# Patient Record
Sex: Female | Born: 1955 | Race: Black or African American | Hispanic: No | Marital: Single | Smoking: Never smoker
Health system: Southern US, Community
[De-identification: ages and names within clinical notes are randomized; demographics above are authoritative.]

## PROBLEM LIST (undated history)

## (undated) DIAGNOSIS — M549 Dorsalgia, unspecified: Secondary | ICD-10-CM

## (undated) DIAGNOSIS — M2550 Pain in unspecified joint: Secondary | ICD-10-CM

## (undated) DIAGNOSIS — T7840XA Allergy, unspecified, initial encounter: Secondary | ICD-10-CM

## (undated) DIAGNOSIS — M1990 Unspecified osteoarthritis, unspecified site: Secondary | ICD-10-CM

## (undated) DIAGNOSIS — I10 Essential (primary) hypertension: Secondary | ICD-10-CM

## (undated) DIAGNOSIS — M069 Rheumatoid arthritis, unspecified: Secondary | ICD-10-CM

## (undated) DIAGNOSIS — E119 Type 2 diabetes mellitus without complications: Secondary | ICD-10-CM

## (undated) DIAGNOSIS — H269 Unspecified cataract: Secondary | ICD-10-CM

## (undated) DIAGNOSIS — R7303 Prediabetes: Secondary | ICD-10-CM

## (undated) DIAGNOSIS — K219 Gastro-esophageal reflux disease without esophagitis: Secondary | ICD-10-CM

## (undated) DIAGNOSIS — M5430 Sciatica, unspecified side: Secondary | ICD-10-CM

## (undated) DIAGNOSIS — D649 Anemia, unspecified: Secondary | ICD-10-CM

## (undated) DIAGNOSIS — R600 Localized edema: Secondary | ICD-10-CM

## (undated) DIAGNOSIS — M810 Age-related osteoporosis without current pathological fracture: Secondary | ICD-10-CM

## (undated) DIAGNOSIS — I169 Hypertensive crisis, unspecified: Secondary | ICD-10-CM

## (undated) DIAGNOSIS — G4730 Sleep apnea, unspecified: Secondary | ICD-10-CM

## (undated) DIAGNOSIS — Z91018 Allergy to other foods: Secondary | ICD-10-CM

## (undated) DIAGNOSIS — D571 Sickle-cell disease without crisis: Secondary | ICD-10-CM

## (undated) HISTORY — PX: TUBAL LIGATION: SHX77

## (undated) HISTORY — DX: Dorsalgia, unspecified: M54.9

## (undated) HISTORY — DX: Sickle-cell disease without crisis: D57.1

## (undated) HISTORY — DX: Sleep apnea, unspecified: G47.30

## (undated) HISTORY — DX: Morbid (severe) obesity due to excess calories: E66.01

## (undated) HISTORY — DX: Allergy, unspecified, initial encounter: T78.40XA

## (undated) HISTORY — DX: Pain in unspecified joint: M25.50

## (undated) HISTORY — DX: Anemia, unspecified: D64.9

## (undated) HISTORY — DX: Allergy to other foods: Z91.018

## (undated) HISTORY — DX: Essential (primary) hypertension: I10

## (undated) HISTORY — DX: Rheumatoid arthritis, unspecified: M06.9

## (undated) HISTORY — PX: OTHER SURGICAL HISTORY: SHX169

## (undated) HISTORY — PX: ABDOMINAL HYSTERECTOMY: SHX81

## (undated) HISTORY — DX: Gastro-esophageal reflux disease without esophagitis: K21.9

## (undated) HISTORY — PX: CHOLECYSTECTOMY: SHX55

## (undated) HISTORY — DX: Prediabetes: R73.03

## (undated) HISTORY — DX: Hypertensive crisis, unspecified: I16.9

## (undated) HISTORY — DX: Type 2 diabetes mellitus without complications: E11.9

## (undated) HISTORY — DX: Localized edema: R60.0

## (undated) HISTORY — PX: ORIF WRIST FRACTURE: SHX2133

## (undated) HISTORY — DX: Sciatica, unspecified side: M54.30

## (undated) HISTORY — DX: Unspecified osteoarthritis, unspecified site: M19.90

## (undated) HISTORY — DX: Age-related osteoporosis without current pathological fracture: M81.0

## (undated) HISTORY — PX: CARPAL TUNNEL RELEASE: SHX101

## (undated) HISTORY — DX: Unspecified cataract: H26.9

## (undated) HISTORY — PX: ORIF ELBOW FRACTURE: SUR928

## (undated) HISTORY — PX: JOINT REPLACEMENT: SHX530

---

## 2018-09-13 ENCOUNTER — Other Ambulatory Visit: Payer: Self-pay | Admitting: Neurological Surgery

## 2018-09-13 DIAGNOSIS — S0990XS Unspecified injury of head, sequela: Secondary | ICD-10-CM

## 2018-09-13 DIAGNOSIS — G44309 Post-traumatic headache, unspecified, not intractable: Secondary | ICD-10-CM

## 2018-09-18 ENCOUNTER — Ambulatory Visit
Admission: RE | Admit: 2018-09-18 | Discharge: 2018-09-18 | Disposition: A | Discharge status: Home / Self Care | Payer: Medicare Other | Source: Ambulatory Visit | Attending: Neurological Surgery | Admitting: Neurological Surgery

## 2018-09-18 ENCOUNTER — Other Ambulatory Visit: Payer: Self-pay

## 2018-09-18 DIAGNOSIS — G44309 Post-traumatic headache, unspecified, not intractable: Secondary | ICD-10-CM

## 2018-09-25 ENCOUNTER — Telehealth: Payer: Self-pay | Admitting: Neurology

## 2018-09-25 NOTE — Telephone Encounter (Signed)
Due to current COVID 19 pandemic, our office is severely reducing in office visits until further notice, in order to minimize the risk to our patients and healthcare providers.   I called patient to confirm her 7/2 appointment with Dr. Leonie Man. LVM advising patient that she is welcome to come to the office for this appointment, and if so she will need to wear a mask. I also advised if patient does not wish to come in the office at this time she can call our office to schedule a virtual visit or reschedule for a later date. My direct office contact info was provided.

## 2018-09-26 ENCOUNTER — Other Ambulatory Visit: Payer: Self-pay

## 2018-09-26 ENCOUNTER — Encounter: Payer: Self-pay | Admitting: Neurology

## 2018-09-26 ENCOUNTER — Ambulatory Visit (INDEPENDENT_AMBULATORY_CARE_PROVIDER_SITE_OTHER): Payer: Medicare Other | Admitting: Neurology

## 2018-09-26 ENCOUNTER — Telehealth: Payer: Self-pay | Admitting: Neurology

## 2018-09-26 VITALS — BP 148/92 | HR 94 | Temp 98.4°F | Ht 65.0 in | Wt 320.0 lb

## 2018-09-26 DIAGNOSIS — S060X9S Concussion with loss of consciousness of unspecified duration, sequela: Secondary | ICD-10-CM | POA: Diagnosis not present

## 2018-09-26 DIAGNOSIS — R251 Tremor, unspecified: Secondary | ICD-10-CM

## 2018-09-26 DIAGNOSIS — R413 Other amnesia: Secondary | ICD-10-CM | POA: Diagnosis not present

## 2018-09-26 DIAGNOSIS — G3184 Mild cognitive impairment, so stated: Secondary | ICD-10-CM | POA: Diagnosis not present

## 2018-09-26 MED ORDER — TOPIRAMATE 25 MG PO TABS: 25.0000 mg | ORAL_TABLET | Freq: Every day | ORAL | 2 refills | Status: DC

## 2018-09-26 MED ORDER — TOPIRAMATE 25 MG PO TABS: 25.0000 mg | ORAL_TABLET | Freq: Every day | ORAL | 3 refills | Status: DC

## 2018-09-26 NOTE — Patient Instructions (Signed)
I had a long discussion with the patient and her daughter regarding her multifocal symptoms following a closed head injury in July 2018 likely representing postconcussive syndrome.  I recommend further evaluation by checking MRI scan of the brain with and without contrast, EEG and memory panel labs.  Trial of Topamax 25 mg twice daily to help with her headaches as well as tremors.  I have advised her to minimize taking Flexeril, gabapentin and tramadol due to questionable effect Kasie and possible side effects.  We also encouraged her to do memory compensation strategies and participate in cognitively challenging activities like solving crossword puzzles, playing bridge and sudoku.  She can also take fish oil 1000mg  daily.  She will return for follow-up in 2 months or call earlier if necessary Memory Compensation Strategies  1. Use "WARM" strategy.  W= write it down  A= associate it  R= repeat it  M= make a mental note  2.   You can keep a Social worker.  Use a 3-ring notebook with sections for the following: calendar, important names and phone numbers,  medications, doctors' names/phone numbers, lists/reminders, and a section to journal what you did  each day.   3.    Use a calendar to write appointments down.  4.    Write yourself a schedule for the day.  This can be placed on the calendar or in a separate section of the Memory Notebook.  Keeping a  regular schedule can help memory.  5.    Use medication organizer with sections for each day or morning/evening pills.  You may need help loading it  6.    Keep a basket, or pegboard by the door.  Place items that you need to take out with you in the basket or on the pegboard.  You may also want to  include a message board for reminders.  7.    Use sticky notes.  Place sticky notes with reminders in a place where the task is performed.  For example: " turn off the  stove" placed by the stove, "lock the door" placed on the door at eye level, "  take your medications" on  the bathroom mirror or by the place where you normally take your medications.  8.    Use alarms/timers.  Use while cooking to remind yourself to check on food or as a reminder to take your medicine, or as a  reminder to make a call, or as a reminder to perform another task, etc.

## 2018-09-26 NOTE — Progress Notes (Signed)
[facility] [address] [place]. [place] [address] [phone]       OFFICE CONSULT NOTE  [name] Date of Birth:  [birth year] Medical Record Number:  [id number]   Referring MD: Sherley Bounds Reason for Referral: Headaches HPI: Brandi [name] is a 63 year old pleasant Caucasian lady who is seen today for initial consultation visit.  She is accompanied by her daughter.  History is obtained from her and review of referral notes.  I have reviewed imaging films in PACS.  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  She states she fell without warm warning face forward and sustained bruises on her forehead and face.  She states she did not lose consciousness but daughter of states that she may have briefly lost it.  Patient was seen in the ER in the hospital where apparently brain scan was unremarkable however I do not have those hospital records for my review today.  Since then she has had constant headaches, tremors of her hands, short-term memory difficulties.  She states her balance is poor.  She has been using a walker at times and she can use a cane also and she wants to.  She is also been dropping objects from her hand intermittently.  She also complains of some occasional word finding difficulties and states her speech is slow.  This symptoms have continued and have not gotten better.  She had a CT scan of the head ordered by Dr. Ronnald Ramp on 09/18/2018 which I personally reviewed shows no acute abnormality.  Patient states she was seeing a neurologist in [place] but I do not have those records either.  She does not remember having an MRI scan done.  She has also chronic back pain and sciatica and she takes Flexeril 10 mg 3 times daily, gabapentin 300 mg 3 times daily and tramadol as needed.  She also takes Requip 1 mg at night for restless legs.  Patient denies any significant increasing headaches, focal extremity weakness,  stroke, TIA, seizures.  ROS:   14 system review of systems is positive for headaches, memory loss, tremors, gait and balance difficulties, word finding difficulty, sciatica and all other systems negative PMH:  Past Medical History:  Diagnosis Date   Hypertension     Social History:  Social History   Socioeconomic History   Marital status: Single    Spouse name: Not on file   Number of children: Not on file   Years of education: Not on file   Highest education level: Not on file  Occupational History   Not on file  Social Needs   Financial resource strain: Not on file   Food insecurity    Worry: Not on file    Inability: Not on file   Transportation needs    Medical: Not on file    Non-medical: Not on file  Tobacco Use   Smoking status: Never Smoker   Smokeless tobacco: Never Used  Substance and Sexual Activity   Alcohol use: Not Currently   Drug use: Not Currently   Sexual activity: Not on file  Lifestyle   Physical activity    Days per week: Not on file    Minutes per session: Not on file   Stress: Not on file  Relationships   Social connections    Talks on phone: Not on file    Gets together: Not on file    Attends religious service: Not  on file    Active member of club or organization: Not on file    Attends meetings of clubs or organizations: Not on file    Relationship status: Not on file   Intimate partner violence    Fear of current or ex partner: Not on file    Emotionally abused: Not on file    Physically abused: Not on file    Forced sexual activity: Not on file  Other Topics Concern   Not on file  Social History Narrative   Not on file    Medications:   Current Outpatient Medications on File Prior to Visit  Medication Sig Dispense Refill   amLODipine-olmesartan (AZOR) 10-40 MG tablet Azor 10 mg-40 mg tablet     atorvastatin (LIPITOR) 40 MG tablet TK 1 T PO QD     BYSTOLIC 20 MG TABS TK 1 T PO QD     cetirizine  (ZYRTEC) 10 MG tablet Take 10 mg by mouth daily.     cloNIDine (CATAPRES) 0.3 MG tablet Take 0.3 mg by mouth 3 (three) times daily.     cyclobenzaprine (FLEXERIL) 10 MG tablet      fluticasone (FLONASE ALLERGY RELIEF) 50 MCG/ACT nasal spray Flonase Allergy Relief 50 mcg/actuation nasal spray,suspension  INSTILL 2 SPRAYS IN EACH NOSTRIL QD     gabapentin (NEURONTIN) 300 MG capsule TK ONE C PO BID     hydrocortisone cream 0.5 % hydrocortisone 0.5 % topical cream     rOPINIRole (REQUIP) 1 MG tablet ropinirole 1 mg tablet     therapeutic multivitamin-minerals (THERAGRAN-M) tablet multivitamin with iron tablet     traMADol (ULTRAM) 50 MG tablet tramadol 50 mg tablet     No current facility-administered medications on file prior to visit.     Allergies:   Allergies  Allergen Reactions   Hydralazine Itching   Meperidine Hcl Other (See Comments)   Aliskiren Hives   Aspirin Nausea Only   Oxycodone-Acetaminophen Itching   Rofecoxib Diarrhea   Spironolactone Other (See Comments)   Furosemide Other (See Comments)   Sulfa Antibiotics Rash    Physical Exam General: Morbidly obese middle-aged Caucasian lady, seated, in no evident distress Head: head normocephalic and atraumatic.   Neck: supple with no carotid or supraclavicular bruits Cardiovascular: regular rate and rhythm, no murmurs Musculoskeletal: no deformity Skin:  no rash/petichiae Vascular:  Normal pulses all extremities  Neurologic Exam Mental Status: Awake and fully alert. Oriented to place and time. Recent and remote memory intact. Attention span, concentration and fund of knowledge appropriate. Mood and affect appropriate.  Diminished recall 2/3.  Mini-Mental status exam score 28/30.  Able to name 14 animals which walk on 4 legs.  Clock drawing 4/4.  Geriatric depression scale score 1 not depressed. Cranial Nerves: Fundoscopic exam reveals sharp disc margins. Pupils equal, briskly reactive to light. Extraocular  movements full without nystagmus. Visual fields full to confrontation. Hearing intact. Facial sensation intact. Face, tongue, palate moves normally and symmetrically.  Motor: Normal bulk and tone. Normal strength in all tested extremity muscles. Sensory.: intact to touch , pinprick , position and vibratory sensation.  Coordination: Rapid alternating movements normal in all extremities. Finger-to-nose and heel-to-shin performed accurately bilaterally. Gait and Station: Arises from chair without difficulty. Stance is normal. Gait demonstrates normal stride length and balance . Able to heel, toe and tandem walk with moderate difficulty.  Reflexes: 1+ and symmetric except ankle jerks are depressed bilaterally. Toes downgoing.       ASSESSMENT: 63 year old Caucasian lady  with 2-year history of multifocal symptoms of headaches, memory loss, tremors likely due to postconcussive syndrome.    PLAN: I had a long discussion with the patient and her daughter regarding her multifocal symptoms following a closed head injury in July 2018 likely representing postconcussive syndrome.  I recommend further evaluation by checking MRI scan of the brain with and without contrast, EEG and memory panel labs.  Trial of Topamax 25 mg twice daily to help with her headaches as well as tremors.  I have advised her to minimize taking Flexeril, gabapentin and tramadol due to questionable effect Kasie and possible side effects.  We also encouraged her to do memory compensation strategies and participate in cognitively challenging activities like solving crossword puzzles, playing bridge and sudoku.  She can also take fish oil 1000mg  daily.  Greater than 50% time during this 45-minute consultation visit was spent on counseling and coordination of care about her multifocal symptoms and discussion about postconcussive syndrome and mild cognitive impairment.  She will return for follow-up in 2 months or call earlier if  necessary Antony Contras, MD  [facility] [address] [place], [place] [address]  Phone [phone] Fax [phone] Note: This document was prepared with digital dictation and possible smart phrase technology. Any transcriptional errors that result from this process are unintentional.

## 2018-09-26 NOTE — Telephone Encounter (Signed)
medicare/bcbs fed order sent to GI. No auth they will reach out to the patient to schedule.

## 2018-09-27 LAB — DEMENTIA PANEL
Homocysteine: 16.6 umol/L (ref 0.0–17.2)
RPR Ser Ql: NONREACTIVE
TSH: 2.7 u[IU]/mL (ref 0.450–4.500)
Vitamin B-12: 363 pg/mL (ref 232–1245)

## 2018-09-30 ENCOUNTER — Other Ambulatory Visit: Payer: Self-pay

## 2018-09-30 ENCOUNTER — Encounter: Payer: Self-pay | Admitting: Nurse Practitioner

## 2018-09-30 ENCOUNTER — Ambulatory Visit (INDEPENDENT_AMBULATORY_CARE_PROVIDER_SITE_OTHER): Payer: Medicare Other | Admitting: Nurse Practitioner

## 2018-09-30 VITALS — BP 132/80 | HR 85 | Temp 98.8°F | Ht 63.2 in | Wt 313.2 lb

## 2018-09-30 DIAGNOSIS — G4730 Sleep apnea, unspecified: Secondary | ICD-10-CM

## 2018-09-30 DIAGNOSIS — G8929 Other chronic pain: Secondary | ICD-10-CM | POA: Diagnosis not present

## 2018-09-30 DIAGNOSIS — R7303 Prediabetes: Secondary | ICD-10-CM

## 2018-09-30 DIAGNOSIS — I10 Essential (primary) hypertension: Secondary | ICD-10-CM

## 2018-09-30 DIAGNOSIS — M25512 Pain in left shoulder: Secondary | ICD-10-CM

## 2018-09-30 DIAGNOSIS — Z79899 Other long term (current) drug therapy: Secondary | ICD-10-CM

## 2018-09-30 LAB — POCT URINALYSIS DIPSTICK
Bilirubin, UA: NEGATIVE
Blood, UA: NEGATIVE
Glucose, UA: NEGATIVE
Ketones, UA: NEGATIVE
Leukocytes, UA: NEGATIVE
Nitrite, UA: NEGATIVE
Protein, UA: NEGATIVE
Spec Grav, UA: 1.015 (ref 1.010–1.025)
Urobilinogen, UA: 0.2 E.U./dL
pH, UA: 7.5 (ref 5.0–8.0)

## 2018-09-30 LAB — POCT UA - MICROALBUMIN
Albumin/Creatinine Ratio, Urine, POC: 30
Creatinine, POC: 200 mg/dL
Microalbumin Ur, POC: 10 mg/L

## 2018-09-30 NOTE — Progress Notes (Signed)
Subjective:     Patient ID: Brandi [name] , female    DOB: 10-16-1955 , 63 y.o.   MRN: [id number]   Chief Complaint  Patient presents with  . Establish Care    patient recently moved here from [place]   . Shoulder Pain    patient stated she is having some left shoulder pain  . Edema    patient is having some swelling in her feet     HPI  Here to establish care Dr. Ezequiel Kayser [facility]), [name] (comes here) - mother.  Recently located here and lives in her own apartment.  Single.  Children - brothers healthy.  Last virtual visit 2 weeks ago for routine care.  Released from workers comp in November.  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    Before her retirement she had a fall in July 2018 hit cement fell at work.  She also hit her head and lost her memory.  She was having difficulty with short term and some long term.  No hospitalization.   Now she has experienced migraines.  She is now having tremors and dropping objects.  She was seen a Neurologist (Dr. Leonie Man).  She had been going through neurology for workers comp (scheduled for EEG and to have a MRI) - topamax started for the migraines.   He is doing a test for the tremors and cognitive assignments to do at home.  CT scan was okay.   Daughter is concerned about being on too much medication, swelling of her feet and dry mouth.  She will dose off.  She has sleep apnea.  Dr. Jimmy Footman (cardiologist) would like to see Dr. Daneen Schick (HTN, cardiac crisis).  Possible congestive heart failure.   She sometimes does not wear her CPAP nightly is more sleepy when she does not wear.    Chronic back pain - will take tramadol when can not move out the bed.   Left shoulder - she would take biofreeze, linament from accupuncturist (helps).    Shoulder Pain  The pain is present in the left shoulder. This is a chronic (worse after her fall) problem. The current episode started more than 1 year ago. There has  been a history of trauma (fall at work in 2018). The problem occurs constantly. The problem has been gradually worsening. The quality of the pain is described as aching. The patient is experiencing no pain. Associated symptoms include stiffness. Pertinent negatives include no fever or numbness. The treatment provided no relief. Family history does not include rheumatoid arthritis. Her past medical history is significant for diabetes (prediabetes).     Past Medical History:  Diagnosis Date  . Arthritis   . Hypertension   . Hypertensive crisis   . Osteoporosis   . Rheumatoid arthritis ([place])   . Sleep apnea      Family History  Problem Relation Age of Onset  . Hypertension Mother   . Cancer Mother   . Heart disease Mother   . Kidney disease Father   . Heart disease Father   . Diabetes Father   . Hypertension Sister      Current Outpatient Medications:  .  amLODipine (NORVASC) 10 MG tablet, Take 10 mg by mouth daily., Disp: , Rfl:  .  atorvastatin (LIPITOR) 40 MG tablet, TK 1 T PO QD, Disp: , Rfl:  .  BYSTOLIC 20 MG TABS, TK 1 T PO QD, Disp: , Rfl:  .  cetirizine (ZYRTEC) 10 MG tablet, Take 10 mg by mouth daily., Disp: , Rfl:  .  Cholecalciferol (VITAMIN D) 50 MCG (2000 UT) CAPS, Take 1 capsule by mouth daily., Disp: , Rfl:  .  cyclobenzaprine (FLEXERIL) 10 MG tablet, 10 mg 3 (three) times daily. , Disp: , Rfl:  .  fluticasone (FLONASE ALLERGY RELIEF) 50 MCG/ACT nasal spray, Flonase Allergy Relief 50 mcg/actuation nasal spray,suspension  INSTILL 2 SPRAYS IN EACH NOSTRIL QD, Disp: , Rfl:  .  gabapentin (NEURONTIN) 300 MG capsule, TK ONE C PO BID, Disp: , Rfl:  .  Magnesium 500 MG TABS, Take 1 tablet by mouth daily., Disp: , Rfl:  .  PRESCRIPTION MEDICATION, 1 tablet 3 (three) times daily. sciaticlear 5 pills TID, Disp: , Rfl:  .  rOPINIRole (REQUIP) 1 MG tablet, ropinirole 1 mg tablet, Disp: , Rfl:  .  topiramate (TOPAMAX) 25 MG tablet, Take 1 tablet (25 mg total) by mouth at  bedtime., Disp: 30 tablet, Rfl: 3 .  Vitamin E 180 MG CAPS, Take 1 capsule by mouth daily., Disp: , Rfl:  .  cloNIDine (CATAPRES) 0.3 MG tablet, Take 0.3 mg by mouth 3 (three) times daily., Disp: , Rfl:  .  hydrocortisone cream 0.5 %, 1 application as needed. , Disp: , Rfl:  .  therapeutic multivitamin-minerals (THERAGRAN-M) tablet, multivitamin with iron tablet, Disp: , Rfl:  .  traMADol (ULTRAM) 50 MG tablet, tramadol 50 mg tablet, Disp: , Rfl:    Allergies  Allergen Reactions  . Hydralazine Itching  . Meperidine Hcl Other (See Comments)  . Aliskiren Hives  . Aspirin Nausea Only  . Oxycodone-Acetaminophen Itching  . Rofecoxib Diarrhea  . Spironolactone Other (See Comments)  . Furosemide Other (See Comments)  . Sulfa Antibiotics Rash     Review of Systems  Constitutional: Negative for fatigue and fever.  Respiratory: Negative.   Cardiovascular: Negative.  Negative for chest pain, palpitations and leg swelling.  Musculoskeletal: Positive for stiffness.  Neurological: Negative for dizziness, numbness and headaches.     Today's Vitals   09/30/18 1155  BP: 132/80  Pulse: 85  Temp: 98.8 F (37.1 C)  TempSrc: Oral  Weight: (!) 313 lb 3.2 oz (142.1 kg)  Height: 5' 3.2" (1.605 m)  PainSc: 6   PainLoc: Shoulder   Body mass index is 55.13 kg/m.   Objective:  Physical Exam Vitals signs reviewed.  Constitutional:      Appearance: Normal appearance.  Cardiovascular:     Rate and Rhythm: Normal rate and regular rhythm.     Pulses: Normal pulses.     Heart sounds: Normal heart sounds. No murmur.  Pulmonary:     Effort: Pulmonary effort is normal. No respiratory distress.     Breath sounds: Normal breath sounds.  Skin:    General: Skin is warm and dry.  Neurological:     General: No focal deficit present.     Mental Status: She is alert and oriented to person, place, and time.     Comments: Drowsy at times during visit  Psychiatric:        Mood and Affect: Mood normal.         Behavior: Behavior normal.        Thought Content: Thought content normal.        Judgment: Judgment normal.         Assessment And Plan:     1. Essential hypertension  Chronic  Fairly controlled  Continue with current medications  Will refer  to cardiology as she had a cardiologist in [place] with Diff - Lipid Profile - Ambulatory referral to Cardiology  2. Sleep apnea, unspecified type  Chronic  She is currently using a CPAP  3. Chronic left shoulder pain  Tenderness to left shoulder with stiffness noted  Will obtain xray  - DG Shoulder Left; Future  4. Prediabetes  Reports as chronic, no current medications  Will check HgbA1c - Hemoglobin A1c  5. Polypharmacy  Her daughter is concerned about all the medications she is taking and if any are causing her drowsiness.  I will refer to CCM to see if we can ensure she is taking her medications appropriately  Patient does dose off during the visit a few times. This is my first time seeing this patient at the office - Referral to Chronic Care Management Services  [name], FNP    THE PATIENT IS ENCOURAGED TO PRACTICE SOCIAL DISTANCING DUE TO THE COVID-19 PANDEMIC.

## 2018-10-01 ENCOUNTER — Ambulatory Visit
Admission: RE | Admit: 2018-10-01 | Discharge: 2018-10-01 | Disposition: A | Discharge status: Home / Self Care | Payer: Medicare Other | Source: Ambulatory Visit | Attending: Nurse Practitioner | Admitting: Nurse Practitioner

## 2018-10-01 DIAGNOSIS — G8929 Other chronic pain: Secondary | ICD-10-CM

## 2018-10-01 LAB — CBC WITH DIFFERENTIAL/PLATELET
Basophils Absolute: 0 10*3/uL (ref 0.0–0.2)
Basos: 0 %
EOS (ABSOLUTE): 0 10*3/uL (ref 0.0–0.4)
Eos: 0 %
Hematocrit: 35.6 % (ref 34.0–46.6)
Hemoglobin: 11.8 g/dL (ref 11.1–15.9)
Immature Grans (Abs): 0 10*3/uL (ref 0.0–0.1)
Immature Granulocytes: 0 %
Lymphocytes Absolute: 2.5 10*3/uL (ref 0.7–3.1)
Lymphs: 39 %
MCH: 29.8 pg (ref 26.6–33.0)
MCHC: 33.1 g/dL (ref 31.5–35.7)
MCV: 90 fL (ref 79–97)
Monocytes Absolute: 0.4 10*3/uL (ref 0.1–0.9)
Monocytes: 6 %
Neutrophils Absolute: 3.4 10*3/uL (ref 1.4–7.0)
Neutrophils: 55 %
Platelets: 255 10*3/uL (ref 150–450)
RBC: 3.96 x10E6/uL (ref 3.77–5.28)
RDW: 13.5 % (ref 11.7–15.4)
WBC: 6.3 10*3/uL (ref 3.4–10.8)

## 2018-10-01 LAB — CMP14 + ANION GAP
ALT: 17 IU/L (ref 0–32)
AST: 20 IU/L (ref 0–40)
Albumin/Globulin Ratio: 1.5 (ref 1.2–2.2)
Albumin: 4.3 g/dL (ref 3.8–4.8)
Alkaline Phosphatase: 165 IU/L — ABNORMAL HIGH (ref 39–117)
Anion Gap: 14 mmol/L (ref 10.0–18.0)
BUN/Creatinine Ratio: 15 (ref 12–28)
BUN: 13 mg/dL (ref 8–27)
Bilirubin Total: 0.4 mg/dL (ref 0.0–1.2)
CO2: 24 mmol/L (ref 20–29)
Calcium: 9.5 mg/dL (ref 8.7–10.3)
Chloride: 103 mmol/L (ref 96–106)
Creatinine, Ser: 0.86 mg/dL (ref 0.57–1.00)
GFR calc Af Amer: 82 mL/min/{1.73_m2} (ref >59)
GFR calc non Af Amer: 71 mL/min/{1.73_m2} (ref >59)
Globulin, Total: 2.8 g/dL (ref 1.5–4.5)
Glucose: 99 mg/dL (ref 65–99)
Potassium: 4.5 mmol/L (ref 3.5–5.2)
Sodium: 141 mmol/L (ref 134–144)
Total Protein: 7.1 g/dL (ref 6.0–8.5)

## 2018-10-01 LAB — LIPID PANEL
Chol/HDL Ratio: 2.4 ratio (ref 0.0–4.4)
Cholesterol, Total: 125 mg/dL (ref 100–199)
HDL: 52 mg/dL (ref >39)
LDL Calculated: 60 mg/dL (ref 0–99)
Triglycerides: 64 mg/dL (ref 0–149)
VLDL Cholesterol Cal: 13 mg/dL (ref 5–40)

## 2018-10-01 LAB — HEMOGLOBIN A1C
Est. average glucose Bld gHb Est-mCnc: 140 mg/dL
Hgb A1c MFr Bld: 6.5 % — ABNORMAL HIGH (ref 4.8–5.6)

## 2018-10-07 ENCOUNTER — Other Ambulatory Visit: Payer: Self-pay

## 2018-10-07 ENCOUNTER — Ambulatory Visit (INDEPENDENT_AMBULATORY_CARE_PROVIDER_SITE_OTHER): Payer: Medicare Other | Admitting: Neurology

## 2018-10-07 DIAGNOSIS — G3184 Mild cognitive impairment, so stated: Secondary | ICD-10-CM

## 2018-10-07 DIAGNOSIS — R410 Disorientation, unspecified: Secondary | ICD-10-CM

## 2018-10-08 ENCOUNTER — Ambulatory Visit: Payer: Self-pay

## 2018-10-08 NOTE — Chronic Care Management (AMB) (Signed)
  Chronic Care Management   Outreach Note  10/08/2018 Name: Brandi [name] MRN: [id number] DOB: 04/27/1955  Referred by: [name], FNP Reason for referral : Polypharmacy  An unsuccessful telephone outreach was attempted today. The patient was referred to the case management team by for assistance with chronic care management and care coordination.   Follow Up Plan: A HIPPA compliant phone message was left for the patient providing contact information and requesting a return call.  The care management team will reach out to the patient again over the next 14 days.   Daneen Schick, BSW, CDP Social Worker, Certified Dementia Practitioner [place] [facility] [place] [facility] [phone]

## 2018-10-09 ENCOUNTER — Telehealth: Payer: Self-pay

## 2018-10-09 NOTE — Telephone Encounter (Signed)
Patient returned my call and I have notified her of her labs [facility]

## 2018-10-21 ENCOUNTER — Ambulatory Visit: Payer: Self-pay

## 2018-10-21 DIAGNOSIS — Z79899 Other long term (current) drug therapy: Secondary | ICD-10-CM

## 2018-10-21 DIAGNOSIS — I10 Essential (primary) hypertension: Secondary | ICD-10-CM

## 2018-10-21 NOTE — Chronic Care Management (AMB) (Signed)
  Chronic Care Management   Outreach Note  10/21/2018 Name: Brandi [name] MRN: [id number] DOB: 1955-08-07  Referred by: [name], FNP Reason for referral : Care Coordination   A second unsuccessful telephone outreach was attempted today. The patient was referred to the case management team for assistance with chronic care management and care coordination.   Follow Up Plan: Unfortunately, the patient does not have a voice mailbox established which prohibited CCM SW from leaving a HIPAA compliant voice message. The patient will be outreached for the third and final attempt over the next 10 days.  Daneen Schick, BSW, CDP Social Worker, Certified Dementia Practitioner [place] [facility] [place] [facility] [phone]

## 2018-10-28 ENCOUNTER — Ambulatory Visit: Payer: Self-pay

## 2018-10-28 DIAGNOSIS — Z79899 Other long term (current) drug therapy: Secondary | ICD-10-CM

## 2018-10-28 NOTE — Chronic Care Management (AMB) (Signed)
  Chronic Care Management   Outreach Note  10/28/2018 Name: Brandi [name] MRN: [id number] DOB: 04-Jun-1955  Referred by: [name], FNP Reason for referral : Care Coordination   Third unsuccessful telephone outreach was attempted today. The patient was referred to the case management team for assistance with chronic care management and care coordination. The patient's primary care provider has been notified of our unsuccessful attempts to make or maintain contact with the patient. The care management team is pleased to engage with this patient at any time in the future should he/she be interested in assistance from the care management team.   Daneen Schick, BSW, CDP Social Worker, Certified Dementia Practitioner [place] [facility] [place] [facility] [phone]

## 2018-10-31 ENCOUNTER — Telehealth: Payer: Self-pay

## 2018-10-31 ENCOUNTER — Other Ambulatory Visit: Payer: Self-pay

## 2018-10-31 ENCOUNTER — Ambulatory Visit
Admission: RE | Admit: 2018-10-31 | Discharge: 2018-10-31 | Disposition: A | Discharge status: Home / Self Care | Payer: Medicare Other | Source: Ambulatory Visit | Attending: Neurology | Admitting: Neurology

## 2018-10-31 DIAGNOSIS — R413 Other amnesia: Secondary | ICD-10-CM

## 2018-10-31 MED ORDER — GADOBENATE DIMEGLUMINE 529 MG/ML IV SOLN
20.0000 mL | Freq: Once | INTRAVENOUS | Status: AC | PRN
  Administered 2018-10-31: 20 mL via INTRAVENOUS

## 2018-11-04 ENCOUNTER — Ambulatory Visit: Payer: Self-pay

## 2018-11-04 ENCOUNTER — Ambulatory Visit (INDEPENDENT_AMBULATORY_CARE_PROVIDER_SITE_OTHER): Payer: Medicare Other

## 2018-11-04 DIAGNOSIS — R7303 Prediabetes: Secondary | ICD-10-CM

## 2018-11-04 DIAGNOSIS — I10 Essential (primary) hypertension: Secondary | ICD-10-CM

## 2018-11-04 DIAGNOSIS — G4730 Sleep apnea, unspecified: Secondary | ICD-10-CM

## 2018-11-04 DIAGNOSIS — Z79899 Other long term (current) drug therapy: Secondary | ICD-10-CM

## 2018-11-04 DIAGNOSIS — M25512 Pain in left shoulder: Secondary | ICD-10-CM

## 2018-11-04 DIAGNOSIS — G8929 Other chronic pain: Secondary | ICD-10-CM

## 2018-11-04 NOTE — Chronic Care Management (AMB) (Signed)
  Chronic Care Management   Initial Visit Note  11/04/2018 Name: Brandi [name] MRN: [id number] DOB: 08/03/1955  Referred by: [name], FNP Reason for referral : Chronic Care Management (CCM RNCM Case Collaboration )   Brandi [name] is a 63 y.o. year old female who is a primary care patient of [name], [place]. The care management team was consulted for assistance with chronic disease management and care coordination needs.   Review of patient status, including review of consultants reports, relevant laboratory and other test results, and collaboration with appropriate care team members and the patient's provider was performed as part of comprehensive patient evaluation and provision of chronic care management services.    I initiated and established the plan of care for Brandi [name] during one on one collaboration with my clinical care management colleague Daneen Schick BSW who is also engaged with this patient to address social work needs.   Goals Addressed    . Assist with Chronic Care Management and Care Coordination needs       Current Barriers:  Marland Kitchen Knowledge Barriers related to resources and support available to address needs related to Chronic Care Management and Care Coordination   Case Manager Clinical Goal(s):  Marland Kitchen Over the next 30 days, patient will work with the CCM team to address needs related to Chronic Care Management and assistance with Polypharmacy needs  Interventions:  . Collaborated with BSW and initiated plan of care to address needs related to Chronic Care Management and Medication management needs  Patient Self Care Activities:  . Attends all scheduled provider appointments . Calls pharmacy for medication refills . Calls provider office for new concerns or questions  Initial goal documentation         Telephone follow up appointment with care management team member scheduled for: 11/27/18  Barb Merino, RN, BSN, CCM Care Management Coordinator [place] [facility]  Direct Phone: [phone]

## 2018-11-04 NOTE — Chronic Care Management (AMB) (Signed)
  Chronic Care Management   Social Work General Note  11/04/2018 Name: Brandi [name] MRN: [id number] DOB: 05/06/1955  Brandi [name] is a 63 y.o. year old female who is a primary care patient of [name], [place]. The CCM was consulted to assist the patient with polypharmacy.  I placed an outbound call to the patient after receiving a voice message returning previous unsuccessful outreach calls to the patient.   Brandi [name] was given information about Chronic Care Management services today including:  1. CCM service includes personalized support from designated clinical staff supervised by her physician, including individualized plan of care and coordination with other care providers 2. 24/7 contact phone numbers for assistance for urgent and routine care needs. 3. Service will only be billed when office clinical staff spend 20 minutes or more in a month to coordinate care. 4. Only one practitioner may furnish and bill the service in a calendar month. 5. The patient may stop CCM services at any time (effective at the end of the month) by phone call to the office staff. 6. The patient will be responsible for cost sharing (co-pay) of up to 20% of the service fee (after annual deductible is met).  Patient agreed to services and verbal consent obtained.   Review of patient status, including review of consultants reports, relevant laboratory and other test results, and collaboration with appropriate care team members and the patient's provider was performed as part of comprehensive patient evaluation and provision of chronic care management services.    SDOH (Social Determinants of Health) screening performed today. Brandi [name] denies any current SDOH challenges. She does exhibit interest in learning more about advance directives, see care plan below.  Goals Addressed            This Visit's Progress     Patient Stated   . "I would like to learn more about advance directives" (pt-stated)        Current Barriers:  . Limited education about the importance of naming a healthcare power of attorney  Clinical Social Work Clinical Goal(s):  Marland Kitchen Over the next 20 days, the patient will review mailed EMMI education on Advance Directive . Over the next 30 days, patient will verbalize basic understanding of Advanced Directives and importance of completion  Interventions: . Interviewed patient about Financial controller and provided education about the importance of completing advanced directives . Mailed the patient an EMMI educational handout on Advance Directives as well as an Emergency planning/management officer . Advised patient to review information mailed by this SW  Patient Self Care Activities:  . Self administers medications as prescribed . Attends all scheduled provider appointments . Performs ADL's independently  Initial goal documentation          Follow Up Plan: SW will follow up with patient by phone over the next 5 weeks. CCM SW communicated to both embedded PharmD and RN Case Manager regarding patient enrollment status.       Daneen Schick, BSW, CDP Social Worker, Certified Dementia Practitioner [place] [facility] [place] [facility] [phone]  Total time spent performing care coordination and/or care management activities with the patient by phone or face to face = 15 minutes.

## 2018-11-04 NOTE — Patient Instructions (Signed)
Social Worker Visit Information  Goals we discussed today:  Goals Addressed            This Visit's Progress     Patient Stated   . "I would like to learn more about advance directives" (pt-stated)       Current Barriers:  . Limited education about the importance of naming a healthcare power of attorney  Clinical Social Work Clinical Goal(s):  Marland Kitchen Over the next 20 days, the patient will review mailed EMMI education on Advance Directive . Over the next 30 days, patient will verbalize basic understanding of Advanced Directives and importance of completion  Interventions: . Interviewed patient about Financial controller and provided education about the importance of completing advanced directives . Mailed the patient an EMMI educational handout on Advance Directives as well as an Emergency planning/management officer . Advised patient to review information mailed by this SW  Patient Self Care Activities:  . Self administers medications as prescribed . Attends all scheduled provider appointments . Performs ADL's independently  Initial goal documentation          Materials provided: Yes: mailed the patient advance directive packet  Ms. Mealing was given information about Chronic Care Management services today including:  1. CCM service includes personalized support from designated clinical staff supervised by her physician, including individualized plan of care and coordination with other care providers 2. 24/7 contact phone numbers for assistance for urgent and routine care needs. 3. Service will only be billed when office clinical staff spend 20 minutes or more in a month to coordinate care. 4. Only one practitioner may furnish and bill the service in a calendar month. 5. The patient may stop CCM services at any time (effective at the end of the month) by phone call to the office staff. 6. The patient will be responsible for cost sharing (co-pay) of up to 20% of the service fee (after annual  deductible is met).  Patient agreed to services and verbal consent obtained.   The patient verbalized understanding of instructions provided today and declined a print copy of patient instruction materials.   Follow up plan: SW will follow up with patient by phone over the next 5 weeks.   Daneen Schick, BSW, CDP Social Worker, Certified Dementia Practitioner [place] [facility] [place] [facility] [phone]

## 2018-11-05 ENCOUNTER — Ambulatory Visit: Payer: Self-pay

## 2018-11-05 DIAGNOSIS — R7303 Prediabetes: Secondary | ICD-10-CM

## 2018-11-05 DIAGNOSIS — I10 Essential (primary) hypertension: Secondary | ICD-10-CM

## 2018-11-05 DIAGNOSIS — G4730 Sleep apnea, unspecified: Secondary | ICD-10-CM

## 2018-11-05 NOTE — Chronic Care Management (AMB) (Signed)
  Chronic Care Management    Social Work Follow Up Note  11/05/2018 Name: Brandi [name] MRN: [id number] DOB: 1955-08-06  I placed an outbound call to the patient in response to a voice message CCM SW received requesting a return call. See care plan below for goals and interventions discussed during today's call.   Goals Addressed            This Visit's Progress     Patient Stated   . "I need to learn more about my Medicare benefits" (pt-stated)       Current Barriers:  . Lacks knowledge of how to access benefit information including dental coverage benefits . Unclear how the patients Medicare Plan interacts with her BCBS plan . Newly enrolled in Medicare coverage as of this year   Clinical Social Work Clinical Goal(s):  Marland Kitchen Over the next 45 days, patient will work with a Medical sales representative to address questions related to which Medicare plans will assist with needed dental work.  Interventions: . Patient interviewed and appropriate assessments performed - the patient requests CCM SW help in providing education regarding Medicare benefits . Determined the patient is new to Medicare and chose to keep her [place] as a secondary plan . Informed by the patient she is in need of dental work and is unclear what may be her out of pocket expense . Advised the patient to contact her health plan for specific questions regarding benefit coverage . Provided patient with information about SHIIP Counselor's and the ability to review different Medicare plans with the patient for upcomming open enrollment . Scheduled outbound call to the patient over the next two weeks to confirm receipt of mailed resource  Patient Self Care Activities:  . Self administers medications as prescribed . Attends all scheduled provider appointments  Initial goal documentation     . "I need to see the cardiologist" (pt-stated)       Current Barriers:  . Awaiting appointment since recent referral  Clinical Social  Work Clinical Goal(s):  Marland Kitchen Over the next 10 days, patient will work with SW to follow up on cariology referral  Interventions: . Patient interviewed who reports having a referral placed to see Dr. Tamala Julian for cardiology follow up since recent move to [facility] . Performed chart review to find referral sent by the patients primary provider on July 6th to [place] . Advised the patient CCM SW would follow up with Dr. Thompson Caul office and request update on referral status . Outbound call to [facility], spoke with Minette Headland who reported she would contact the patient regarding referral for Dr. Tamala Julian  Patient Self Care Activities:  . Attends all scheduled provider appointments . Calls pharmacy for medication refills . Calls provider office for new concerns or questions  Initial goal documentation         Follow Up Plan: SW will follow up with patient by phone over the next three weeks as previously planned.  Daneen Schick, BSW, CDP Social Worker, Certified Dementia Practitioner [place] [facility] [place] [facility] [phone]  Total time spent performing care coordination and/or care management activities with the patient by phone or face to face = 16 minutes.

## 2018-11-05 NOTE — Patient Instructions (Signed)
Social Worker Visit Information  Goals we discussed today:  Goals Addressed            This Visit's Progress     Patient Stated   . "I need to learn more about my Medicare benefits" (pt-stated)       Current Barriers:  . Lacks knowledge of how to access benefit information including dental coverage benefits . Unclear how the patients Medicare Plan interacts with her BCBS plan . Newly enrolled in Medicare coverage as of this year   Clinical Social Work Clinical Goal(s):  Marland Kitchen Over the next 45 days, patient will work with a Medical sales representative to address questions related to which Medicare plans will assist with needed dental work.  Interventions: . Patient interviewed and appropriate assessments performed - the patient requests CCM SW help in providing education regarding Medicare benefits . Determined the patient is new to Medicare and chose to keep her [place] as a secondary plan . Informed by the patient she is in need of dental work and is unclear what may be her out of pocket expense . Advised the patient to contact her health plan for specific questions regarding benefit coverage . Provided patient with information about SHIIP Counselor's and the ability to review different Medicare plans with the patient for upcomming open enrollment . Scheduled outbound call to the patient over the next two weeks to confirm receipt of mailed resource  Patient Self Care Activities:  . Self administers medications as prescribed . Attends all scheduled provider appointments  Initial goal documentation     . "I need to see the cardiologist" (pt-stated)       Current Barriers:  . Awaiting appointment since recent referral  Clinical Social Work Clinical Goal(s):  Marland Kitchen Over the next 10 days, patient will work with SW to follow up on cariology referral  Interventions: . Patient interviewed who reports having a referral placed to see Dr. Tamala Julian for cardiology follow up since recent move to [facility] . Performed chart review to find referral sent by the patients primary provider on July 6th to [place] . Advised the patient CCM SW would follow up with Dr. Thompson Caul office and request update on referral status . Outbound call to [facility], spoke with Minette Headland who reported she would contact the patient regarding referral for Dr. Tamala Julian  Patient Self Care Activities:  . Attends all scheduled provider appointments . Calls pharmacy for medication refills . Calls provider office for new concerns or questions  Initial goal documentation         Materials Provided: Yes: mailed the patient information on SHIIP Counseling  Follow Up Plan: SW will follow up with patient by phone over the next three weeks.   Daneen Schick, BSW, CDP Social Worker, Certified Dementia Practitioner [place] [facility] [place] [facility] [phone]

## 2018-11-06 ENCOUNTER — Telehealth: Payer: Self-pay

## 2018-11-11 ENCOUNTER — Telehealth: Payer: Medicare Other

## 2018-11-22 ENCOUNTER — Telehealth: Payer: Self-pay

## 2018-11-27 ENCOUNTER — Telehealth: Payer: Self-pay

## 2018-11-28 ENCOUNTER — Telehealth: Payer: Self-pay

## 2018-11-28 ENCOUNTER — Ambulatory Visit: Payer: Self-pay

## 2018-11-28 DIAGNOSIS — Z79899 Other long term (current) drug therapy: Secondary | ICD-10-CM

## 2018-11-28 DIAGNOSIS — I10 Essential (primary) hypertension: Secondary | ICD-10-CM

## 2018-11-28 DIAGNOSIS — R7303 Prediabetes: Secondary | ICD-10-CM

## 2018-11-28 NOTE — Chronic Care Management (AMB) (Signed)
  Chronic Care Management   Social Work Note  11/28/2018 Name: Etha Suman MRN: [id number] DOB: 1956/03/11  Unsuccessful outbound call placed to the patient to confirm receipt of mailed resources. CCM SW left a HIPAA compliant voice message requesting a return call.  Follow Up Plan: SW will follow up with patient by phone over the next 10 days.  Daneen Schick, BSW, CDP Social Worker, Certified Dementia Practitioner [place] [facility] [place] [facility] [phone]

## 2018-12-04 ENCOUNTER — Encounter: Payer: Self-pay | Admitting: Neurology

## 2018-12-04 ENCOUNTER — Ambulatory Visit (INDEPENDENT_AMBULATORY_CARE_PROVIDER_SITE_OTHER): Payer: Medicare Other | Admitting: Neurology

## 2018-12-04 ENCOUNTER — Other Ambulatory Visit: Payer: Self-pay

## 2018-12-04 VITALS — BP 140/88 | HR 79 | Temp 98.0°F | Wt 319.0 lb

## 2018-12-04 DIAGNOSIS — R42 Dizziness and giddiness: Secondary | ICD-10-CM | POA: Diagnosis not present

## 2018-12-04 NOTE — Patient Instructions (Signed)
I had a long discussion the patient and her daughter regarding her multifocal symptoms of dizziness, memory loss and headaches which appear stable or perhaps slightly improving.  I recommend she stay on Topamax 25 mg daily which seems to be helping her hand pain and paresthesias.  I advised her to consider reducing the gabapentin and Flexeril if she does not need it as it may be contributing to her dizziness.  I recommend she do increase participation in cognitively challenging activities like solving crossword puzzles, playing bridge, sudoku to help her mild memory difficulties.  We also discussed memory compensation strategies.  She will return for follow-up in the future only as necessary.  Memory Compensation Strategies  1. Use "WARM" strategy.  W= write it down  A= associate it  R= repeat it  M= make a mental note  2.   You can keep a Social worker.  Use a 3-ring notebook with sections for the following: calendar, important names and phone numbers,  medications, doctors' names/phone numbers, lists/reminders, and a section to journal what you did  each day.   3.    Use a calendar to write appointments down.  4.    Write yourself a schedule for the day.  This can be placed on the calendar or in a separate section of the Memory Notebook.  Keeping a  regular schedule can help memory.  5.    Use medication organizer with sections for each day or morning/evening pills.  You may need help loading it  6.    Keep a basket, or pegboard by the door.  Place items that you need to take out with you in the basket or on the pegboard.  You may also want to  include a message board for reminders.  7.    Use sticky notes.  Place sticky notes with reminders in a place where the task is performed.  For example: " turn off the  stove" placed by the stove, "lock the door" placed on the door at eye level, " take your medications" on  the bathroom mirror or by the place where you normally take your  medications.  8.    Use alarms/timers.  Use while cooking to remind yourself to check on food or as a reminder to take your medicine, or as a  reminder to make a call, or as a reminder to perform another task, etc.

## 2018-12-04 NOTE — Progress Notes (Signed)
[facility] [address] [place]. [place] [address] [phone]       OFFICE CONSULT NOTE  [name] Date of Birth:  1956-02-23 Medical Record Number:  [id number]   Referring MD: Sherley Bounds Reason for Referral: Headaches HPI: Initial visit 09/26/2018 Brandi [name] is a 63 year old pleasant Caucasian lady who is seen today for initial consultation visit.  She is accompanied by her daughter.  History is obtained from her and review of referral notes.  I have reviewed imaging films in PACS.  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  She states she fell without warm warning face forward and sustained bruises on her forehead and face.  She states she did not lose consciousness but daughter of states that she may have briefly lost it.  Patient was seen in the ER in the hospital where apparently brain scan was unremarkable however I do not have those hospital records for my review today.  Since then she has had constant headaches, tremors of her hands, short-term memory difficulties.  She states her balance is poor.  She has been using a walker at times and she can use a cane also and she wants to.  She is also been dropping objects from her hand intermittently.  She also complains of some occasional word finding difficulties and states her speech is slow.  This symptoms have continued and have not gotten better.  She had a CT scan of the head ordered by Dr. Ronnald Ramp on 09/18/2018 which I personally reviewed shows no acute abnormality.  Patient states she was seeing a neurologist in [place] but I do not have those records either.  She does not remember having an MRI scan done.  She has also chronic back pain and sciatica and she takes Flexeril 10 mg 3 times daily, gabapentin 300 mg 3 times daily and tramadol as needed.  She also takes Requip 1 mg at night for restless legs.  Patient denies any significant increasing headaches,  focal extremity weakness, stroke, TIA, seizures. Update 12/04/2018 : She returns for follow-up after last visit 2 months ago.  She is accompanied by her daughter.  Patient states she is doing better.  She had only one fall recently when she felt dizzy and off-balance.  She uses a cane most of the time now and is careful when she gets up and turns.  She had MRI scan of the brain done on 10/31/2018 which was fairly unremarkable except for incidental empty sella.  EEG done on 10/09/2018 was normal.  Vitamin B12, TSH and RPR were normal on 09/26/2018.  Patient states Topamax that has started is helping hand pain and she is able to grip objects better she is not dropping them as much.  She feels her memory difficulties are unchanged.  She has however not been participating in activities for regular stress laxation.  She complains of dizziness but has been taking both Flexeril and gabapentin 3 times daily and she is willing to reduce them as she feels she does not need them as much now. ROS:   14 system review of systems is positive for headaches, memory loss, tremors, gait and balance difficulties,  sciatica and all other systems negative PMH:  Past Medical History:  Diagnosis Date  . Arthritis   . Hypertension   . Hypertensive crisis   . Osteoporosis   . Rheumatoid arthritis ([place])   . Sleep apnea  Social History:  Social History   Socioeconomic History  . Marital status: Single    Spouse name: Not on file  . Number of children: Not on file  . Years of education: Not on file  . Highest education level: Not on file  Occupational History  . Not on file  Social Needs  . Financial resource strain: Not very hard  . Food insecurity    Worry: Never true    Inability: Never true  . Transportation needs    Medical: No    Non-medical: No  Tobacco Use  . Smoking status: Never Smoker  . Smokeless tobacco: Never Used  Substance and Sexual Activity  . Alcohol use: Not Currently  . Drug use: Not  Currently  . Sexual activity: Not on file  Lifestyle  . Physical activity    Days per week: Not on file    Minutes per session: Not on file  . Stress: Only a little  Relationships  . Social connections    Talks on phone: More than three times a week    Gets together: More than three times a week    Attends religious service: 1 to 4 times per year    Active member of club or organization: No    Attends meetings of clubs or organizations: Never    Relationship status: Not on file  . Intimate partner violence    Fear of current or ex partner: Not on file    Emotionally abused: Not on file    Physically abused: Not on file    Forced sexual activity: Not on file  Other Topics Concern  . Not on file  Social History Narrative  . Not on file    Medications:   Current Outpatient Medications on File Prior to Visit  Medication Sig Dispense Refill  . amLODipine (NORVASC) 10 MG tablet Take 10 mg by mouth daily.    Marland Kitchen atorvastatin (LIPITOR) 40 MG tablet TK 1 T PO QD    . BYSTOLIC 20 MG TABS TK 1 T PO QD    . cetirizine (ZYRTEC) 10 MG tablet Take 10 mg by mouth daily.    . Cholecalciferol (VITAMIN D) 50 MCG (2000 UT) CAPS Take 1 capsule by mouth daily.    . cloNIDine (CATAPRES) 0.3 MG tablet Take 0.3 mg by mouth 3 (three) times daily.    . cyclobenzaprine (FLEXERIL) 10 MG tablet 10 mg 3 (three) times daily.     . fluticasone (FLONASE ALLERGY RELIEF) 50 MCG/ACT nasal spray Flonase Allergy Relief 50 mcg/actuation nasal spray,suspension  INSTILL 2 SPRAYS IN EACH NOSTRIL QD    . gabapentin (NEURONTIN) 300 MG capsule TK ONE C PO BID    . hydrocortisone cream 0.5 % 1 application as needed.     . Magnesium 500 MG TABS Take 1 tablet by mouth daily.    Marland Kitchen PRESCRIPTION MEDICATION 1 tablet 3 (three) times daily. sciaticlear 5 pills TID    . rOPINIRole (REQUIP) 1 MG tablet ropinirole 1 mg tablet    . therapeutic multivitamin-minerals (THERAGRAN-M) tablet multivitamin with iron tablet    . topiramate  (TOPAMAX) 25 MG tablet Take 1 tablet (25 mg total) by mouth at bedtime. 30 tablet 3  . traMADol (ULTRAM) 50 MG tablet tramadol 50 mg tablet    . Vitamin E 180 MG CAPS Take 1 capsule by mouth daily.     No current facility-administered medications on file prior to visit.     Allergies:   Allergies  Allergen Reactions  . Hydralazine Itching  . Meperidine Hcl Other (See Comments)  . Aliskiren Hives  . Aspirin Nausea Only  . Latex     hives  . Oxycodone-Acetaminophen Itching  . Rofecoxib Diarrhea  . Spironolactone Other (See Comments)  . Furosemide Other (See Comments)  . Sulfa Antibiotics Rash    Physical Exam General: Morbidly obese middle-aged Caucasian lady, seated, in no evident distress Head: head normocephalic and atraumatic.   Neck: supple with no carotid or supraclavicular bruits Cardiovascular: regular rate and rhythm, no murmurs Musculoskeletal: no deformity Skin:  no rash/petichiae Vascular:  Normal pulses all extremities  Neurologic Exam Mental Status: Awake and fully alert. Oriented to place and time. Recent and remote memory intact. Attention span, concentration and fund of knowledge appropriate. Mood and affect appropriate.  Diminished recall 2/3.  Mini-Mental status exam not done able to name 12 animals which walk on 4 legs.   Cranial Nerves: Fundoscopic exam reveals sharp disc margins. Pupils equal, briskly reactive to light. Extraocular movements full without nystagmus. Visual fields full to confrontation. Hearing intact. Facial sensation intact. Face, tongue, palate moves normally and symmetrically.  Motor: Normal bulk and tone. Normal strength in all tested extremity muscles. Sensory.: intact to touch , pinprick , position and vibratory sensation.  Coordination: Rapid alternating movements normal in all extremities. Finger-to-nose and heel-to-shin performed accurately bilaterally. Gait and Station: Arises from chair without difficulty. Stance is normal. Gait  demonstrates normal stride length and balance . Able to heel, toe and tandem walk with moderate difficulty.  Reflexes: 1+ and symmetric except ankle jerks are depressed bilaterally. Toes downgoing.       ASSESSMENT: 63 year old Caucasian lady with 2-year history of multifocal symptoms of headaches, memory loss, tremors likely due to postconcussive syndrome.    PLAN: I had a long discussion the patient and her daughter regarding her multifocal symptoms of dizziness, memory loss and headaches which appear stable or perhaps slightly improving.  I recommend she stay on Topamax 25 mg daily which seems to be helping her hand pain and paresthesias.  I advised her to consider reducing the gabapentin and Flexeril if she does not need it as it may be contributing to her dizziness.  I recommend she do increase participation in cognitively challenging activities like solving crossword puzzles, playing bridge, sudoku to help her mild memory difficulties.  We also discussed memory compensation strategies.  She will return for follow-up in the future only as necessary.  Greater than 50% time during this 25-minute  visit was spent on counseling and coordination of care about her multifocal symptoms and discussion about postconcussive syndrome and mild cognitive impairment.   Antony Contras, MD  [facility] [address] [place], [place] [address]  Phone [phone] Fax [phone] Note: This document was prepared with digital dictation and possible smart phrase technology. Any transcriptional errors that result from this process are unintentional.

## 2018-12-06 ENCOUNTER — Telehealth: Payer: Self-pay

## 2018-12-06 ENCOUNTER — Ambulatory Visit: Payer: Medicare Other

## 2018-12-06 DIAGNOSIS — R7303 Prediabetes: Secondary | ICD-10-CM

## 2018-12-06 DIAGNOSIS — I10 Essential (primary) hypertension: Secondary | ICD-10-CM

## 2018-12-06 DIAGNOSIS — Z79899 Other long term (current) drug therapy: Secondary | ICD-10-CM

## 2018-12-06 NOTE — Patient Instructions (Signed)
Social Worker Visit Information  Goals we discussed today:  Goals Addressed            This Visit's Progress     Patient Stated   . "I need to learn more about my Medicare benefits" (pt-stated)       Current Barriers:  . Lacks knowledge of how to access benefit information including dental coverage benefits . Unclear how the patients Medicare Plan interacts with her BCBS plan . Newly enrolled in Medicare coverage as of this year   Clinical Social Work Clinical Goal(s):  Marland Kitchen Over the next 45 days, patient will work with a Medical sales representative to address questions related to which Medicare plans will assist with needed dental work.  Interventions: Completed 12/06/2018 . Outbound call to the patient to confirm receipt of mailed resource . Determined the patient has yet to receive information . Mailed the patient information on [employer] counselors . Advised the patient to expect a return call over the next three weeks for review of resource  Patient Self Care Activities:  . Self administers medications as prescribed . Attends all scheduled provider appointments  Please see past updates related to this goal by clicking on the "Past Updates" button in the selected goal      . "I would like to learn more about advance directives" (pt-stated)       Current Barriers:  . Limited education about the importance of naming a healthcare power of attorney  Clinical Social Work Clinical Goal(s):  Marland Kitchen Over the next 20 days, the patient will review mailed EMMI education on Advance Directive . Over the next 30 days, patient will verbalize basic understanding of Advanced Directives and importance of completion  Interventions: Completed 12/06/2018 . Outbound call placed to the patient to confirm receipt of mailing . Determined the patient has yet to receive Advance Directive packet in the mail . Mailed the patient another packet for review . Scheduled follow up call over the next three weeks to assist  with completion  Patient Self Care Activities:  . Self administers medications as prescribed . Attends all scheduled provider appointments . Performs ADL's independently  Please see past updates related to this goal by clicking on the "Past Updates" button in the selected goal           Materials Provided: Yes: mailed resource information on SHIIP and Advance Directives  Follow Up Plan: SW will follow up with patient by phone over the next three weeks.   Daneen Schick, BSW, CDP Social Worker, Certified Dementia Practitioner [place] [facility] [phone]

## 2018-12-06 NOTE — Chronic Care Management (AMB) (Signed)
Chronic Care Management    Social Work Follow Up Note  12/06/2018 Name: Brandi [name] MRN: [id number] DOB: 02/17/1956  Brandi [name] is a 63 y.o. year old female who is a primary care patient of Brandi [name], [place]. The CCM team was consulted for assistance with [employer] .   Review of patient status, including review of consultants reports, other relevant assessments, and collaboration with appropriate care team members and the patient's provider was performed as part of comprehensive patient evaluation and provision of chronic care management services.     Outpatient Encounter Medications as of 12/06/2018  Medication Sig  . amLODipine (NORVASC) 10 MG tablet Take 10 mg by mouth daily.  Marland Kitchen atorvastatin (LIPITOR) 40 MG tablet TK 1 T PO QD  . BYSTOLIC 20 MG TABS TK 1 T PO QD  . cetirizine (ZYRTEC) 10 MG tablet Take 10 mg by mouth daily.  . Cholecalciferol (VITAMIN D) 50 MCG (2000 UT) CAPS Take 1 capsule by mouth daily.  . cloNIDine (CATAPRES) 0.3 MG tablet Take 0.3 mg by mouth 3 (three) times daily.  . cyclobenzaprine (FLEXERIL) 10 MG tablet 10 mg 3 (three) times daily.   . fluticasone (FLONASE ALLERGY RELIEF) 50 MCG/ACT nasal spray Flonase Allergy Relief 50 mcg/actuation nasal spray,suspension  INSTILL 2 SPRAYS IN EACH NOSTRIL QD  . gabapentin (NEURONTIN) 300 MG capsule TK ONE C PO BID  . hydrocortisone cream 0.5 % 1 application as needed.   . Magnesium 500 MG TABS Take 1 tablet by mouth daily.  Marland Kitchen PRESCRIPTION MEDICATION 1 tablet 3 (three) times daily. sciaticlear 5 pills TID  . rOPINIRole (REQUIP) 1 MG tablet ropinirole 1 mg tablet  . therapeutic multivitamin-minerals (THERAGRAN-M) tablet multivitamin with iron tablet  . topiramate (TOPAMAX) 25 MG tablet Take 1 tablet (25 mg total) by mouth at bedtime.  . traMADol (ULTRAM) 50 MG tablet tramadol 50 mg tablet  . Vitamin E 180 MG CAPS Take 1 capsule by mouth daily.   No facility-administered encounter medications on file as of  12/06/2018.      Goals Addressed            This Visit's Progress     Patient Stated   . "I need to learn more about my Medicare benefits" (pt-stated)       Current Barriers:  . Lacks knowledge of how to access benefit information including dental coverage benefits . Unclear how the patients Medicare Plan interacts with her BCBS plan . Newly enrolled in Medicare coverage as of this year   Clinical Social Work Clinical Goal(s):  Marland Kitchen Over the next 45 days, patient will work with a Medical sales representative to address questions related to which Medicare plans will assist with needed dental work.  Interventions: Completed 12/06/2018 . Outbound call to the patient to confirm receipt of mailed resource . Determined the patient has yet to receive information . Mailed the patient information on [employer] counselors . Advised the patient to expect a return call over the next three weeks for review of resource  Patient Self Care Activities:  . Self administers medications as prescribed . Attends all scheduled provider appointments  Please see past updates related to this goal by clicking on the "Past Updates" button in the selected goal      . "I would like to learn more about advance directives" (pt-stated)       Current Barriers:  . Limited education about the importance of naming a healthcare power of attorney  Clinical Social Work Clinical Goal(s):  .  Over the next 20 days, the patient will review mailed EMMI education on Advance Directive . Over the next 30 days, patient will verbalize basic understanding of Advanced Directives and importance of completion  Interventions: Completed 12/06/2018 . Outbound call placed to the patient to confirm receipt of mailing . Determined the patient has yet to receive Advance Directive packet in the mail . Mailed the patient another packet for review . Scheduled follow up call over the next three weeks to assist with completion  Patient Self Care  Activities:  . Self administers medications as prescribed . Attends all scheduled provider appointments . Performs ADL's independently  Please see past updates related to this goal by clicking on the "Past Updates" button in the selected goal           Follow Up Plan: SW will follow up with patient by phone over the next three weeks.   Daneen Schick, BSW, CDP Social Worker, Certified Dementia Practitioner [place] [facility] [phone]  Total time spent performing care coordination and/or care management activities with the patient by phone or face to face = 8 minutes.

## 2018-12-11 ENCOUNTER — Other Ambulatory Visit: Payer: Self-pay | Admitting: Nurse Practitioner

## 2018-12-11 DIAGNOSIS — G8929 Other chronic pain: Secondary | ICD-10-CM

## 2018-12-13 ENCOUNTER — Telehealth: Payer: Self-pay

## 2018-12-23 ENCOUNTER — Telehealth: Payer: Self-pay

## 2018-12-27 ENCOUNTER — Ambulatory Visit: Payer: Medicare Other

## 2018-12-27 DIAGNOSIS — I10 Essential (primary) hypertension: Secondary | ICD-10-CM

## 2018-12-27 NOTE — Chronic Care Management (AMB) (Signed)
Chronic Care Management   Social Work Follow Up Note  12/27/2018 Name: Brandi [name] MRN: [id number] DOB: 23-May-1955  Brandi [name] is a 63 y.o. year old female who is a primary care patient of [name], [place]. The CCM team was consulted for assistance with [employer] .   Review of patient status, including review of consultants reports, other relevant assessments, and collaboration with appropriate care team members and the patient's provider was performed as part of comprehensive patient evaluation and provision of chronic care management services.    Advanced Directives Status: Not completed at this time.  See Care Plan for related entries.   Outpatient Encounter Medications as of 12/27/2018  Medication Sig  . amLODipine (NORVASC) 10 MG tablet Take 10 mg by mouth daily.  Marland Kitchen atorvastatin (LIPITOR) 40 MG tablet TK 1 T PO QD  . BYSTOLIC 20 MG TABS TK 1 T PO QD  . cetirizine (ZYRTEC) 10 MG tablet Take 10 mg by mouth daily.  . Cholecalciferol (VITAMIN D) 50 MCG (2000 UT) CAPS Take 1 capsule by mouth daily.  . cloNIDine (CATAPRES) 0.3 MG tablet Take 0.3 mg by mouth 3 (three) times daily.  . cyclobenzaprine (FLEXERIL) 10 MG tablet 10 mg 3 (three) times daily.   . fluticasone (FLONASE ALLERGY RELIEF) 50 MCG/ACT nasal spray Flonase Allergy Relief 50 mcg/actuation nasal spray,suspension  INSTILL 2 SPRAYS IN EACH NOSTRIL QD  . gabapentin (NEURONTIN) 300 MG capsule TK ONE C PO BID  . hydrocortisone cream 0.5 % 1 application as needed.   . Magnesium 500 MG TABS Take 1 tablet by mouth daily.  Marland Kitchen PRESCRIPTION MEDICATION 1 tablet 3 (three) times daily. sciaticlear 5 pills TID  . rOPINIRole (REQUIP) 1 MG tablet ropinirole 1 mg tablet  . therapeutic multivitamin-minerals (THERAGRAN-M) tablet multivitamin with iron tablet  . topiramate (TOPAMAX) 25 MG tablet Take 1 tablet (25 mg total) by mouth at bedtime.  . traMADol (ULTRAM) 50 MG tablet tramadol 50 mg tablet  . Vitamin E 180 MG CAPS Take 1  capsule by mouth daily.   No facility-administered encounter medications on file as of 12/27/2018.      Goals Addressed            This Visit's Progress     Patient Stated   . "I would like to learn more about advance directives" (pt-stated)   On track    Current Barriers:  . Limited education about the importance of naming a healthcare power of attorney  Clinical Social Work Clinical Goal(s):  Marland Kitchen Over the next 20 days, the patient will review mailed EMMI education on Advance Directive . Over the next 30 days, patient will verbalize basic understanding of Advanced Directives and importance of completion  Interventions: Completed 12/27/2018 . Outbound call placed to the patient to confirm receipt of mailed resources . Discussed patient has received forms but has yet to begin completion . Encouraged the patient to review forms and contact SW in the coming days if questions arise . Scheduled follow up call to the patient to review documents in the next two weeks  Patient Self Care Activities:  . Self administers medications as prescribed . Attends all scheduled provider appointments . Performs ADL's independently  Please see past updates related to this goal by clicking on the "Past Updates" button in the selected goal           Follow Up Plan: SW will follow up with patient by phone over the next two weeks.   Daneen Schick,  BSW, CDP Social Worker, Certified Dementia Practitioner [place] [facility] [phone]  Total time spent performing care coordination and/or care management activities with the patient by phone or face to face = 5 minutes.        SIGNATURE

## 2018-12-27 NOTE — Patient Instructions (Signed)
Social Worker Visit Information  Goals we discussed today:  Goals Addressed            This Visit's Progress     Patient Stated   . "I would like to learn more about advance directives" (pt-stated)   On track    Current Barriers:  . Limited education about the importance of naming a healthcare power of attorney  Clinical Social Work Clinical Goal(s):  Marland Kitchen Over the next 20 days, the patient will review mailed EMMI education on Advance Directive . Over the next 30 days, patient will verbalize basic understanding of Advanced Directives and importance of completion  Interventions: Completed 12/27/2018 . Outbound call placed to the patient to confirm receipt of mailed resources . Discussed patient has received forms but has yet to begin completion . Encouraged the patient to review forms and contact SW in the coming days if questions arise . Scheduled follow up call to the patient to review documents in the next two weeks  Patient Self Care Activities:  . Self administers medications as prescribed . Attends all scheduled provider appointments . Performs ADL's independently  Please see past updates related to this goal by clicking on the "Past Updates" button in the selected goal           Follow Up Plan: SW will follow up with patient by phone over the next two weeks   Daneen Schick, BSW, CDP Social Worker, Certified Dementia Practitioner [place] [facility] [place] [facility] [phone]

## 2019-01-02 ENCOUNTER — Telehealth: Payer: Medicare Other

## 2019-01-03 ENCOUNTER — Ambulatory Visit: Payer: Self-pay | Admitting: Pharmacist

## 2019-01-03 DIAGNOSIS — M25512 Pain in left shoulder: Secondary | ICD-10-CM

## 2019-01-03 DIAGNOSIS — G4730 Sleep apnea, unspecified: Secondary | ICD-10-CM

## 2019-01-03 DIAGNOSIS — G8929 Other chronic pain: Secondary | ICD-10-CM

## 2019-01-03 DIAGNOSIS — I10 Essential (primary) hypertension: Secondary | ICD-10-CM

## 2019-01-03 NOTE — Progress Notes (Signed)
  Chronic Care Management   Outreach Note  01/03/2019 Name: Brandi [name] MRN: [id number] DOB: September 28, 1955  Referred by: [name], FNP Reason for referral : Chronic Care Management   An unsuccessful telephone outreach was attempted today. The patient was referred to the case management team by for assistance with care management and care coordination.   Follow Up Plan: The care management team will reach out to the patient again over the next 7-10 business days.    Regina Eck, PharmD, BCPS Clinical Pharmacist, [facility] [place]: [phone]

## 2019-01-06 ENCOUNTER — Encounter: Payer: Self-pay | Admitting: Nurse Practitioner

## 2019-01-06 ENCOUNTER — Ambulatory Visit (INDEPENDENT_AMBULATORY_CARE_PROVIDER_SITE_OTHER): Payer: Medicare Other | Admitting: Nurse Practitioner

## 2019-01-06 ENCOUNTER — Other Ambulatory Visit: Payer: Self-pay

## 2019-01-06 VITALS — BP 142/86 | HR 86 | Temp 98.8°F | Ht 63.2 in | Wt 315.8 lb

## 2019-01-06 DIAGNOSIS — I10 Essential (primary) hypertension: Secondary | ICD-10-CM

## 2019-01-06 DIAGNOSIS — R7303 Prediabetes: Secondary | ICD-10-CM

## 2019-01-06 DIAGNOSIS — Z1159 Encounter for screening for other viral diseases: Secondary | ICD-10-CM

## 2019-01-06 DIAGNOSIS — Z23 Encounter for immunization: Secondary | ICD-10-CM | POA: Diagnosis not present

## 2019-01-06 NOTE — Progress Notes (Signed)
Subjective:     Patient ID: Brandi [name] , female    DOB: 1955-07-09 , 63 y.o.   MRN: [id number]   Chief Complaint  Patient presents with  . Hypertension    HPI  She has just come from physical therapy - 2nd session was today.   She has been to the Neurologist - has a new CPAP machine.   She is eating better and has lost 4 lbs.   She is no longer taking the clonidine due to was causing her drowsiness - she is to see the Cardiologist November 2nd  Hypertension This is a chronic problem. The current episode started more than 1 year ago. The problem is uncontrolled. Pertinent negatives include no anxiety, blurred vision, chest pain, headaches or palpitations. Risk factors for coronary artery disease include obesity, dyslipidemia and sedentary lifestyle. There are no compliance problems.  There is no history of angina. There is no history of chronic renal disease.     Past Medical History:  Diagnosis Date  . Arthritis   . Hypertension   . Hypertensive crisis   . Osteoporosis   . Rheumatoid arthritis ([place])   . Sleep apnea      Family History  Problem Relation Age of Onset  . Hypertension Mother   . Cancer Mother   . Heart disease Mother   . Kidney disease Father   . Heart disease Father   . Diabetes Father   . Hypertension Sister      Current Outpatient Medications:  .  amLODipine (NORVASC) 10 MG tablet, Take 10 mg by mouth daily., Disp: , Rfl:  .  atorvastatin (LIPITOR) 40 MG tablet, TK 1 T PO QD, Disp: , Rfl:  .  BYSTOLIC 20 MG TABS, TK 1 T PO QD, Disp: , Rfl:  .  cetirizine (ZYRTEC) 10 MG tablet, Take 10 mg by mouth daily., Disp: , Rfl:  .  Cholecalciferol (VITAMIN D) 50 MCG (2000 UT) CAPS, Take 1 capsule by mouth daily., Disp: , Rfl:  .  cyclobenzaprine (FLEXERIL) 10 MG tablet, 10 mg 3 (three) times daily. , Disp: , Rfl:  .  fluticasone (FLONASE ALLERGY RELIEF) 50 MCG/ACT nasal spray, Flonase Allergy Relief 50 mcg/actuation nasal spray,suspension  INSTILL 2 SPRAYS IN  EACH NOSTRIL QD, Disp: , Rfl:  .  gabapentin (NEURONTIN) 300 MG capsule, TK ONE C PO BID, Disp: , Rfl:  .  hydrocortisone cream 0.5 %, 1 application as needed. , Disp: , Rfl:  .  Magnesium 500 MG TABS, Take 1 tablet by mouth daily., Disp: , Rfl:  .  PRESCRIPTION MEDICATION, 1 tablet 3 (three) times daily. sciaticlear 5 pills TID, Disp: , Rfl:  .  therapeutic multivitamin-minerals (THERAGRAN-M) tablet, multivitamin with iron tablet, Disp: , Rfl:  .  topiramate (TOPAMAX) 25 MG tablet, Take 1 tablet (25 mg total) by mouth at bedtime., Disp: 30 tablet, Rfl: 3 .  traMADol (ULTRAM) 50 MG tablet, tramadol 50 mg tablet, Disp: , Rfl:  .  Vitamin E 180 MG CAPS, Take 1 capsule by mouth daily., Disp: , Rfl:  .  cloNIDine (CATAPRES) 0.3 MG tablet, Take 0.3 mg by mouth 3 (three) times daily., Disp: , Rfl:  .  rOPINIRole (REQUIP) 1 MG tablet, ropinirole 1 mg tablet, Disp: , Rfl:    Allergies  Allergen Reactions  . Hydralazine Itching  . Meperidine Hcl Other (See Comments)  . Aliskiren Hives  . Aspirin Nausea Only  . Latex     hives  . Oxycodone-Acetaminophen Itching  .  Rofecoxib Diarrhea  . Spironolactone Other (See Comments)  . Furosemide Other (See Comments)  . Sulfa Antibiotics Rash     Review of Systems  Constitutional: Negative.   Eyes: Negative for blurred vision.  Respiratory: Negative.   Cardiovascular: Negative.  Negative for chest pain, palpitations and leg swelling.  Neurological: Negative for dizziness and headaches.     Today's Vitals   01/06/19 1103  BP: (!) 142/86  Pulse: 86  Temp: 98.8 F (37.1 C)  TempSrc: Oral  Weight: (!) 315 lb 12.8 oz (143.2 kg)  Height: 5' 3.2" (1.605 m)  PainSc: 2   PainLoc: Shoulder   Body mass index is 55.59 kg/m.   Objective:  Physical Exam Vitals signs reviewed.  Constitutional:      Appearance: Normal appearance.  Cardiovascular:     Rate and Rhythm: Normal rate and regular rhythm.     Pulses: Normal pulses.     Heart sounds:  Normal heart sounds. No murmur.  Pulmonary:     Effort: Pulmonary effort is normal. No respiratory distress.     Breath sounds: Normal breath sounds.  Musculoskeletal:     Right lower leg: Edema (ted hose on) present.     Left lower leg: Edema (ted hose on) present.  Skin:    General: Skin is warm and dry.     Capillary Refill: Capillary refill takes less than 2 seconds.  Neurological:     General: No focal deficit present.     Mental Status: She is alert and oriented to person, place, and time.  Psychiatric:        Mood and Affect: Mood normal.        Behavior: Behavior normal.        Thought Content: Thought content normal.        Judgment: Judgment normal.         Assessment And Plan:     1. Essential hypertension B/P is fairly controlled.  She has  CMP ordered to check renal function.  The importance of regular exercise and dietary modification was stressed to the patient.  Stressed importance of losing ten percent of her body weight to help with B/P control.  The weight loss would help with decreasing cardiac and cancer risk as well.  - Lipid panel  2. Prediabetes  Chronic, controlled  No current medications  Encouraged to limit intake of sugary foods and drinks  Encouraged to increase physical activity to 150 minutes per week as tolerated. - CMP14 + Anion Gap - Hemoglobin A1c  3. Need for influenza vaccination Influenza vaccine given in office Advised to take Tylenol as needed for muscle aches or fever - Flu vaccine HIGH DOSE PF (Fluzone High dose)  4. Encounter for hepatitis C screening test for low risk patient  Will check for Hepatitis C screening due to being born between the years 1945-1965 - Hepatitis C antibody         [name], FNP    THE PATIENT IS ENCOURAGED TO PRACTICE SOCIAL DISTANCING DUE TO THE COVID-19 PANDEMIC.

## 2019-01-07 LAB — LIPID PANEL
Chol/HDL Ratio: 3.3 ratio (ref 0.0–4.4)
Cholesterol, Total: 168 mg/dL (ref 100–199)
HDL: 51 mg/dL (ref >39)
LDL Chol Calc (NIH): 97 mg/dL (ref 0–99)
Triglycerides: 110 mg/dL (ref 0–149)
VLDL Cholesterol Cal: 20 mg/dL (ref 5–40)

## 2019-01-07 LAB — CMP14 + ANION GAP
ALT: 23 IU/L (ref 0–32)
AST: 15 IU/L (ref 0–40)
Albumin/Globulin Ratio: 1.7 (ref 1.2–2.2)
Albumin: 4.7 g/dL (ref 3.8–4.8)
Alkaline Phosphatase: 106 IU/L (ref 39–117)
Anion Gap: 15 mmol/L (ref 10.0–18.0)
BUN/Creatinine Ratio: 10 — ABNORMAL LOW (ref 12–28)
BUN: 8 mg/dL (ref 8–27)
Bilirubin Total: 0.3 mg/dL (ref 0.0–1.2)
CO2: 25 mmol/L (ref 20–29)
Calcium: 9.6 mg/dL (ref 8.7–10.3)
Chloride: 103 mmol/L (ref 96–106)
Creatinine, Ser: 0.77 mg/dL (ref 0.57–1.00)
GFR calc Af Amer: 94 mL/min/{1.73_m2} (ref >59)
GFR calc non Af Amer: 81 mL/min/{1.73_m2} (ref >59)
Globulin, Total: 2.7 g/dL (ref 1.5–4.5)
Glucose: 74 mg/dL (ref 65–99)
Potassium: 3.8 mmol/L (ref 3.5–5.2)
Sodium: 143 mmol/L (ref 134–144)
Total Protein: 7.4 g/dL (ref 6.0–8.5)

## 2019-01-07 LAB — HEPATITIS C ANTIBODY: Hep C Virus Ab: 0.1 s/co ratio (ref 0.0–0.9)

## 2019-01-07 LAB — HEMOGLOBIN A1C
Est. average glucose Bld gHb Est-mCnc: 143 mg/dL
Hgb A1c MFr Bld: 6.6 % — ABNORMAL HIGH (ref 4.8–5.6)

## 2019-01-09 ENCOUNTER — Ambulatory Visit (INDEPENDENT_AMBULATORY_CARE_PROVIDER_SITE_OTHER): Payer: Medicare Other

## 2019-01-09 ENCOUNTER — Ambulatory Visit: Payer: Medicare Other

## 2019-01-09 ENCOUNTER — Telehealth: Payer: Medicare Other

## 2019-01-09 ENCOUNTER — Other Ambulatory Visit: Payer: Self-pay

## 2019-01-09 DIAGNOSIS — I10 Essential (primary) hypertension: Secondary | ICD-10-CM

## 2019-01-09 DIAGNOSIS — R7303 Prediabetes: Secondary | ICD-10-CM

## 2019-01-09 DIAGNOSIS — G4730 Sleep apnea, unspecified: Secondary | ICD-10-CM

## 2019-01-09 DIAGNOSIS — M25512 Pain in left shoulder: Secondary | ICD-10-CM

## 2019-01-09 DIAGNOSIS — G8929 Other chronic pain: Secondary | ICD-10-CM

## 2019-01-09 NOTE — Patient Instructions (Signed)
Social Worker Visit Information  Goals we discussed today:  Goals Addressed            This Visit's Progress     Patient Stated   . "I fell yesterday and am really sore" (pt-stated)       Current Barriers:  . Painful due to recent fall at daughters house  Clinical Social Work Clinical Goal(s):  Marland Kitchen Over the next 10 days the patient will follow up with her provider as directed by SW if pain worsens or persists . Over the next 45 days the patient will work with CM team to identify fall hazards and develop a plan for future fall intervention strategies . Over the next 90 days the patient will not experience a fall as a result of following established plan of care   CCM SW Interventions: Completed 01/09/2019 . Patient interviewed and appropriate assessments performed . Determined the patient experienced a fall while at her daughters house and trying to reach for a shoe "it just happened so fast" . Discussed fall intervention strategies to prevent future falls- identified the patient uses a reacher to pick items up off the floor but did not have one at the time of recent fall . Assessed for injury due to fall - the patient reports soreness to right side, specifically right should . Determined the patient is administering two extra strength Tylenol as needed to alleviate pain . Advised the patient to contact her primary providers office for worsening pain or continued pain over the next 5 days . Collaboration with RN Case Manager to update on patient reported fall . Collaboration with primary provider, [name] FNP regarding recent fall and current plan  Patient Self Care Activities:  . Self administers medications as prescribed . Attends all scheduled provider appointments . Calls provider office for new concerns or questions   Initial goal documentation     . COMPLETED: "I need to see the cardiologist" (pt-stated)       Current Barriers:  . Awaiting appointment since recent  referral  Clinical Social Work Clinical Goal(s):  Marland Kitchen Over the next 10 days, patient will work with SW to follow up on cariology referral  CCM SW Interventions: . Performed chart review to determined patient has upcomming appointment scheduled with Dr Tamala Julian for 11/2 . Goal Met  Patient Self Care Activities:  . Attends all scheduled provider appointments . Calls pharmacy for medication refills . Calls provider office for new concerns or questions  Please see past updates related to this goal by clicking on the "Past Updates" button in the selected goal      . COMPLETED: "I would like to learn more about advance directives" (pt-stated)       Current Barriers:  . Limited education about the importance of naming a healthcare power of attorney  Clinical Social Work Clinical Goal(s):  Marland Kitchen Over the next 20 days, the patient will review mailed EMMI education on Advance Directive . Over the next 30 days, patient will verbalize basic understanding of Advanced Directives and importance of completion  Interventions: Completed 01/09/2019 . Outbound call placed to the patient review documents . Assessed for understanding and answered all patient questions . Advised the patient that once document is notarized to provide a copy to her daughter, whom she plans to appoint as POA, and to her provider office . Encouraged the patient to contact SW with future questions related to advance directives  Patient Self Care Activities:  . Self administers medications as  prescribed . Attends all scheduled provider appointments . Performs ADL's independently  Please see past updates related to this goal by clicking on the "Past Updates" button in the selected goal           Materials Provided: Verbal education about advance directives provided by phone  Follow Up Plan: SW will follow up with patient by phone over the next month  Daneen Schick, BSW, CDP Social Worker, Certified Dementia  Practitioner [place] [facility] [place] [facility] [phone]

## 2019-01-09 NOTE — Chronic Care Management (AMB) (Signed)
Chronic Care Management    Social Work Follow Up Note  01/09/2019 Name: Brandi [name] MRN: [id number] DOB: 10/10/1955  Brandi [name] is a 63 y.o. year old female who is a primary care patient of Brandi [name], [place]. The CCM team was consulted for assistance with care coordination and disease management.  Review of patient status, including review of consultants reports, other relevant assessments, and collaboration with appropriate care team members and the patient's provider was performed as part of comprehensive patient evaluation and provision of chronic care management services.    SDOH (Social Determinants of Health) screening performed today: None. See Care Plan for related entries.   Advanced Directives Status: In process See Care Plan for related entries.   Outpatient Encounter Medications as of 01/09/2019  Medication Sig  . amLODipine (NORVASC) 10 MG tablet Take 10 mg by mouth daily.  Marland Kitchen atorvastatin (LIPITOR) 40 MG tablet TK 1 T PO QD  . BYSTOLIC 20 MG TABS TK 1 T PO QD  . cetirizine (ZYRTEC) 10 MG tablet Take 10 mg by mouth daily.  . Cholecalciferol (VITAMIN D) 50 MCG (2000 UT) CAPS Take 1 capsule by mouth daily.  . cloNIDine (CATAPRES) 0.3 MG tablet Take 0.3 mg by mouth 3 (three) times daily.  . cyclobenzaprine (FLEXERIL) 10 MG tablet 10 mg 3 (three) times daily.   . fluticasone (FLONASE ALLERGY RELIEF) 50 MCG/ACT nasal spray Flonase Allergy Relief 50 mcg/actuation nasal spray,suspension  INSTILL 2 SPRAYS IN EACH NOSTRIL QD  . gabapentin (NEURONTIN) 300 MG capsule TK ONE C PO BID  . hydrocortisone cream 0.5 % 1 application as needed.   . Magnesium 500 MG TABS Take 1 tablet by mouth daily.  Marland Kitchen PRESCRIPTION MEDICATION 1 tablet 3 (three) times daily. sciaticlear 5 pills TID  . rOPINIRole (REQUIP) 1 MG tablet ropinirole 1 mg tablet  . therapeutic multivitamin-minerals (THERAGRAN-M) tablet multivitamin with iron tablet  . topiramate (TOPAMAX) 25 MG tablet Take 1 tablet (25 mg  total) by mouth at bedtime.  . traMADol (ULTRAM) 50 MG tablet tramadol 50 mg tablet  . Vitamin E 180 MG CAPS Take 1 capsule by mouth daily.   No facility-administered encounter medications on file as of 01/09/2019.      Goals Addressed            This Visit's Progress     Patient Stated   . "I fell yesterday and am really sore" (pt-stated)       Current Barriers:  . Painful due to recent fall at daughters house  Clinical Social Work Clinical Goal(s):  Marland Kitchen Over the next 10 days the patient will follow up with her provider as directed by SW if pain worsens or persists . Over the next 45 days the patient will work with CM team to identify fall hazards and develop a plan for future fall intervention strategies . Over the next 90 days the patient will not experience a fall as a result of following established plan of care   CCM SW Interventions: Completed 01/09/2019 . Patient interviewed and appropriate assessments performed . Determined the patient experienced a fall while at her daughters house and trying to reach for a shoe "it just happened so fast" . Discussed fall intervention strategies to prevent future falls- identified the patient uses a reacher to pick items up off the floor but did not have one at the time of recent fall . Assessed for injury due to fall - the patient reports soreness to right side, specifically right should .  Determined the patient is administering two extra strength Tylenol as needed to alleviate pain . Advised the patient to contact her primary providers office for worsening pain or continued pain over the next 5 days . Collaboration with RN Case Manager to update on patient reported fall . Collaboration with primary provider, Brandi Brine FNP regarding recent fall and current plan  Patient Self Care Activities:  . Self administers medications as prescribed . Attends all scheduled provider appointments . Calls provider office for new concerns or  questions   Initial goal documentation     . COMPLETED: "I need to see the cardiologist" (pt-stated)       Current Barriers:  . Awaiting appointment since recent referral  Clinical Social Work Clinical Goal(s):  Marland Kitchen Over the next 10 days, patient will work with SW to follow up on cariology referral  CCM SW Interventions: . Performed chart review to determined patient has upcomming appointment scheduled with Dr Tamala Julian for 11/2 . Goal Met  Patient Self Care Activities:  . Attends all scheduled provider appointments . Calls pharmacy for medication refills . Calls provider office for new concerns or questions  Please see past updates related to this goal by clicking on the "Past Updates" button in the selected goal      . COMPLETED: "I would like to learn more about advance directives" (pt-stated)       Current Barriers:  . Limited education about the importance of naming a healthcare power of attorney  Clinical Social Work Clinical Goal(s):  Marland Kitchen Over the next 20 days, the patient will review mailed EMMI education on Advance Directive . Over the next 30 days, patient will verbalize basic understanding of Advanced Directives and importance of completion  Interventions: Completed 01/09/2019 . Outbound call placed to the patient review documents . Assessed for understanding and answered all patient questions . Advised the patient that once document is notarized to provide a copy to her daughter, whom she plans to appoint as POA, and to her provider office . Encouraged the patient to contact SW with future questions related to advance directives  Patient Self Care Activities:  . Self administers medications as prescribed . Attends all scheduled provider appointments . Performs ADL's independently  Please see past updates related to this goal by clicking on the "Past Updates" button in the selected goal           Follow Up Plan: SW will follow up with patient by phone over the  next month   Daneen Schick, BSW, CDP Social Worker, Certified Dementia Practitioner [facility] [phone]  Total time spent performing care coordination and/or care management activities with the patient by phone or face to face = 10 minutes.

## 2019-01-13 NOTE — Chronic Care Management (AMB) (Signed)
Chronic Care Management   Initial Visit Note  01/09/2019 Name: Brandi [name] MRN: [id number] DOB: Jul 18, 1955  Referred by: [name], FNP Reason for referral : Chronic Care Management ([place] [facility]phone Outreach )   Brandi [name] is a 63 y.o. year old female who is a primary care patient of [name], [place]. The CCM team was consulted for assistance with chronic disease management and care coordination needs related to HTN, DMII and Falls  Review of patient status, including review of consultants reports, relevant laboratory and other test results, and collaboration with appropriate care team members and the patient's provider was performed as part of comprehensive patient evaluation and provision of chronic care management services.    SDOH (Social Determinants of Health) screening performed today: None. See Care Plan for related entries.   Advanced Directives Status: N See Care Plan and [employer]yn[employer] application for related entries.   I spoke with Brandi [name] by telephone today to assess for CCM RN CM needs related to DM, HTN and Falls.   Medications: Outpatient Encounter Medications as of 01/09/2019  Medication Sig  . amLODipine (NORVASC) 10 MG tablet Take 10 mg by mouth daily.  Marland Kitchen atorvastatin (LIPITOR) 40 MG tablet TK 1 T PO QD  . BYSTOLIC 20 MG TABS TK 1 T PO QD  . cetirizine (ZYRTEC) 10 MG tablet Take 10 mg by mouth daily.  . Cholecalciferol (VITAMIN D) 50 MCG (2000 UT) CAPS Take 1 capsule by mouth daily.  . cloNIDine (CATAPRES) 0.3 MG tablet Take 0.3 mg by mouth 3 (three) times daily.  . cyclobenzaprine (FLEXERIL) 10 MG tablet 10 mg 3 (three) times daily.   . fluticasone (FLONASE ALLERGY RELIEF) 50 MCG/ACT nasal spray Flonase Allergy Relief 50 mcg/actuation nasal spray,suspension  INSTILL 2 SPRAYS IN EACH NOSTRIL QD  . gabapentin (NEURONTIN) 300 MG capsule TK ONE C PO BID  . hydrocortisone cream 0.5 % 1 application as needed.   . Magnesium 500 MG TABS Take 1 tablet by  mouth daily.  Marland Kitchen PRESCRIPTION MEDICATION 1 tablet 3 (three) times daily. sciaticlear 5 pills TID  . rOPINIRole (REQUIP) 1 MG tablet ropinirole 1 mg tablet  . therapeutic multivitamin-minerals (THERAGRAN-M) tablet multivitamin with iron tablet  . topiramate (TOPAMAX) 25 MG tablet Take 1 tablet (25 mg total) by mouth at bedtime.  . traMADol (ULTRAM) 50 MG tablet tramadol 50 mg tablet  . Vitamin E 180 MG CAPS Take 1 capsule by mouth daily.   No facility-administered encounter medications on file as of 01/09/2019.      Objective:  Lab Results  Component Value Date   HGBA1C 6.6 (H) 01/06/2019   HGBA1C 6.5 (H) 09/30/2018   Lab Results  Component Value Date   MICROALBUR 10 09/30/2018   LDLCALC 97 01/06/2019   CREATININE 0.77 01/06/2019   BP Readings from Last 3 Encounters:  01/06/19 (!) 142/86  12/04/18 140/88  09/30/18 132/80    Goals Addressed      Patient Stated   . "I am concerned about having a SE to my BP meds" (pt-stated)       Current Barriers:  Marland Kitchen Knowledge Deficits related to treatment management for HTN  Nurse Case Manager Clinical Goal(s):  Marland Kitchen Over the next 30 days, patient will work with the CCM team to address needs related to medication management for HTN . Over the next 90 days, patient will report having adequate BP control 130/80 or better, w/o noted SE to the prescribed medications for this condition  CCM RN  CM Interventions:  01/09/19 call completed with patient  . Evaluation of current treatment plan related to Hypertension and patient's adherence to plan as established by provider. . Provided education to patient re: importance of keeping BP well controlled to help avoid cardiovascular and or cardiopulmonary complications/discussed patient will consider monitory her BP at home and recording readings; discussed target BP of 130/80; patient instructed PCP provider and or the CCM team of abnormal readings . Reviewed medications with patient and discussed  indication, dosage and frequency of currently prescribed antihypertensive therapies; discussed patient concerns her BP meds may be increasing the edema to her lower extremities; patient instructed to avoid table salt and stay well hydrated; discussed patient would like to seek counsel from embedded Pharm D regarding current pharmacological treatment plan  . Collaborated with embedded Pharm D [name] via in basket message regarding outreach to patient to discussed currently treatment plan for HTN and voiced patient's concerns of potential SE to medications . Discussed plans with patient for ongoing care management follow up and provided patient with direct contact information for care management team . Advised patient, providing education and rationale, to monitor blood pressure daily and record, calling the CCM team and or PCP for findings outside established parameters.   Patient Self Care Activities:  . Self administers medications as prescribed . Attends all scheduled provider appointments . Calls pharmacy for medication refills . Attends church or other social activities . Performs ADL's independently . Performs IADL's independently . Calls provider office for new concerns or questions  Initial goal documentation     . "I fell yesterday and am really sore" (pt-stated)       Current Barriers:  . Painful due to recent fall at daughters house  Clinical Social Work Clinical Goal(s):  Marland Kitchen Over the next 10 days the patient will follow up with her provider as directed by SW if pain worsens or persists . Over the next 45 days the patient will work with CM team to identify fall hazards and develop a plan for future fall intervention strategies . Over the next 90 days the patient will not experience a fall as a result of following established plan of care   CCM SW Interventions: Completed 01/09/2019 . Patient interviewed and appropriate assessments performed . Determined the patient  experienced a fall while at her daughters house and trying to reach for a shoe "it just happened so fast" . Discussed fall intervention strategies to prevent future falls- identified the patient uses a reacher to pick items up off the floor but did not have one at the time of recent fall . Assessed for injury due to fall - the patient reports soreness to right side, specifically right should . Determined the patient is administering two extra strength Tylenol as needed to alleviate pain . Advised the patient to contact her primary providers office for worsening pain or continued pain over the next 5 days . Collaboration with RN Case Manager to update on patient reported fall . Collaboration with primary provider, [name] FNP regarding recent fall and current plan  CCM RN CM Interventions:  01/09/19: Call completed with patient  . Received an update from embedded BSW Daneen Schick stating Brandi [name] had a fall while in her daughter's home . Evaluation of current treatment plan related to recent Fall and patient's adherence to plan as established by provider. . Advised patient to notify the CCM team and or PCP provider promptly to report any/all falls  . Provided education  to patient re: the importance of wearing skid proof socks, and or good supportive shoes with ambulation, discussed importance of avoidance of walking on uneven ground; discussed importance of patient using a cane or walker if needed to help with balance and or support to help avoid falls; discussed patient landed on her Rt shoulder but denies injury, she did not go to the ED for evaluation, she is able to use her shoulder w/o difficulty but reports having some soreness; discussed she  is currently receiving in home PT for therapy on her Lt shoulder, she will ask the PT to evaluate her right PT at next visit and make any recommendations related to PT for this extremity  . Discussed plans with patient for ongoing care management  follow up and provided patient with direct contact information for care management team  Patient Self Care Activities:  . Self administers medications as prescribed . Attends all scheduled provider appointments . Calls provider office for new concerns or questions  Initial goal documentation     . "I know I can learn to eat a Miyani Cronic better" (pt-stated)       Current Barriers:  Marland Kitchen Knowledge Deficits related to disease process and treatment management of Diabetes  Nurse Case Manager Clinical Goal(s):  Marland Kitchen Over the next 90 days, patient will work with the CCM team to address needs related to improved diabetes disease management   CCM RN CM Interventions:  01/09/19 call completed with patient   . Evaluation of current treatment plan related to Diabetes and patient's adherence to plan as established by provider. . Provided education to patient re: target A1C range of less than 5.7% for optimal management and to return to normal range; discussed current A1C of 6.2% obtained on 01/06/19; discussed Self Care interventions to lower A1C including implementing daily exercise regimen of 150 minutes weekly; eating a low carb, low sugar diet with use of Meal planning and portion control; discussed importance of maintaining weight within normal BMI and taking prescribed medications as directed w/o missed doses . Reviewed medications with patient and discussed current pharmacological treatment for DM including indication, dosage and frequency of meds . Discussed plans with patient for ongoing care management follow up and provided patient with direct contact information for care management team . Provided patient with printed educational materials related to Diabetes Management, Diabetes Meal Planning, s/s of Hypo/Hyperglycemia, Carb Counting and Carb Choices to be mailed to patient's home . Advised patient, providing education and rationale, to check cbg daily before meals and record, calling the CCM team and  or PCP for findings outside established parameters.    Patient Self Care Activities:  . Self administers medications as prescribed . Attends all scheduled provider appointments . Calls pharmacy for medication refills . Attends church or other social activities . Performs ADL's independently . Performs IADL's independently . Calls provider office for new concerns or questions  Initial goal documentation        Plan:   Telephone follow up appointment with care management team member scheduled for: 02/06/19  Barb Merino, RN, BSN, CCM Care Management Coordinator [facility]  Direct Phone: [phone]

## 2019-01-13 NOTE — Patient Instructions (Signed)
Visit Information  Goals Addressed      Patient Stated   . "I am concerned about having a SE to my BP meds" (pt-stated)       Current Barriers:  Marland Kitchen Knowledge Deficits related to treatment management for HTN  Nurse Case Manager Clinical Goal(s):  Marland Kitchen Over the next 30 days, patient will work with the CCM team to address needs related to medication management for HTN . Over the next 90 days, patient will report having adequate BP control 130/80 or better, w/o noted SE to the prescribed medications for this condition  CCM RN CM Interventions:  01/09/19 call completed with patient  . Evaluation of current treatment plan related to Hypertension and patient's adherence to plan as established by provider. . Provided education to patient re: importance of keeping BP well controlled to help avoid cardiovascular and or cardiopulmonary complications/discussed patient will consider monitory her BP at home and recording readings; discussed target BP of 130/80; patient instructed PCP provider and or the CCM team of abnormal readings . Reviewed medications with patient and discussed indication, dosage and frequency of currently prescribed antihypertensive therapies; discussed patient concerns her BP meds may be increasing the edema to her lower extremities; patient instructed to avoid table salt and stay well hydrated; discussed patient would like to seek counsel from embedded Pharm D regarding current pharmacological treatment plan  . Collaborated with embedded Pharm D [name] via in basket message regarding outreach to patient to discussed currently treatment plan for HTN and voiced patient's concerns of potential SE to medications . Discussed plans with patient for ongoing care management follow up and provided patient with direct contact information for care management team . Advised patient, providing education and rationale, to monitor blood pressure daily and record, calling the CCM team and or PCP for  findings outside established parameters.   Patient Self Care Activities:  . Self administers medications as prescribed . Attends all scheduled provider appointments . Calls pharmacy for medication refills . Attends church or other social activities . Performs ADL's independently . Performs IADL's independently . Calls provider office for new concerns or questions   Initial goal documentation     . "I fell yesterday and am really sore" (pt-stated)       Current Barriers:  . Painful due to recent fall at daughters house  Clinical Social Work Clinical Goal(s):  Marland Kitchen Over the next 10 days the patient will follow up with her provider as directed by SW if pain worsens or persists . Over the next 45 days the patient will work with CM team to identify fall hazards and develop a plan for future fall intervention strategies . Over the next 90 days the patient will not experience a fall as a result of following established plan of care   CCM SW Interventions: Completed 01/09/2019 . Patient interviewed and appropriate assessments performed . Determined the patient experienced a fall while at her daughters house and trying to reach for a shoe "it just happened so fast" . Discussed fall intervention strategies to prevent future falls- identified the patient uses a reacher to pick items up off the floor but did not have one at the time of recent fall . Assessed for injury due to fall - the patient reports soreness to right side, specifically right should . Determined the patient is administering two extra strength Tylenol as needed to alleviate pain . Advised the patient to contact her primary providers office for worsening pain or continued pain  over the next 5 days . Collaboration with RN Case Manager to update on patient reported fall . Collaboration with primary provider, [name] FNP regarding recent fall and current plan  CCM RN CM Interventions:  01/09/19: Call completed with  patient  . Received an update from embedded BSW Daneen Schick stating Ms. Prichett had a fall while in her daughter's home . Evaluation of current treatment plan related to recent Fall and patient's adherence to plan as established by provider. . Advised patient to notify the CCM team and or PCP provider promptly to report any/all falls  . Provided education to patient re: the importance of wearing skid proof socks, and or good supportive shoes with ambulation, discussed importance of avoidance of walking on uneven ground; discussed importance of patient using a cane or walker if needed to help with balance and or support to help avoid falls; discussed patient landed on her Rt shoulder but denies injury, she did not go to the ED for evaluation, she is able to use her shoulder w/o difficulty but reports having some soreness; discussed she  is currently receiving in home PT for therapy on her Lt shoulder, she will ask the PT to evaluate her right PT at next visit and make any recommendations related to PT for this extremity  . Discussed plans with patient for ongoing care management follow up and provided patient with direct contact information for care management team  Patient Self Care Activities:  . Self administers medications as prescribed . Attends all scheduled provider appointments . Calls provider office for new concerns or questions  Initial goal documentation     . "I know I can learn to eat a Gerene Nedd better" (pt-stated)       Current Barriers:  Marland Kitchen Knowledge Deficits related to disease process and treatment management of Diabetes  Nurse Case Manager Clinical Goal(s):  Marland Kitchen Over the next 90 days, patient will work with the CCM team to address needs related to improved diabetes disease management   CCM RN CM Interventions:  01/09/19 call completed with patient   . Evaluation of current treatment plan related to Diabetes and patient's adherence to plan as established by  provider. . Provided education to patient re: target A1C range of less than 5.7% for optimal management and to return to normal range; discussed current A1C of 6.2% obtained on 01/06/19; discussed Self Care interventions to lower A1C including implementing daily exercise regimen of 150 minutes weekly; eating a low carb, low sugar diet with use of Meal planning and portion control; discussed importance of maintaining weight within normal BMI and taking prescribed medications as directed w/o missed doses . Reviewed medications with patient and discussed current pharmacological treatment for DM including indication, dosage and frequency of meds . Discussed plans with patient for ongoing care management follow up and provided patient with direct contact information for care management team . Provided patient with printed educational materials related to Diabetes Management, Diabetes Meal Planning, s/s of Hypo/Hyperglycemia, Carb Counting and Carb Choices to be mailed to patient's home . Advised patient, providing education and rationale, to check cbg daily before meals and record, calling the CCM team and or PCP for findings outside established parameters.    Patient Self Care Activities:  . Self administers medications as prescribed . Attends all scheduled provider appointments . Calls pharmacy for medication refills . Attends church or other social activities . Performs ADL's independently . Performs IADL's independently . Calls provider office for new concerns or questions  Initial goal documentation        The patient verbalized understanding of instructions provided today and declined a print copy of patient instruction materials.   Telephone follow up appointment with care management team member scheduled for: 02/06/19  Barb Merino, RN, BSN, CCM Care Management Coordinator [facility]  Direct Phone: [phone]

## 2019-01-14 ENCOUNTER — Ambulatory Visit: Payer: Self-pay | Admitting: Pharmacist

## 2019-01-14 DIAGNOSIS — Z79899 Other long term (current) drug therapy: Secondary | ICD-10-CM

## 2019-01-14 DIAGNOSIS — I10 Essential (primary) hypertension: Secondary | ICD-10-CM

## 2019-01-14 DIAGNOSIS — R7303 Prediabetes: Secondary | ICD-10-CM

## 2019-01-15 ENCOUNTER — Telehealth: Payer: Self-pay

## 2019-01-15 NOTE — Telephone Encounter (Signed)
Left patient a v/m to call the office. YRL,RMA

## 2019-01-15 NOTE — Telephone Encounter (Signed)
-----   Message from [name], [place] sent at 01/15/2019  8:36 AM EDT ----- Regarding: RE: pt had a fall Thank you, I will have Dia Sitter to call and check on her today.  ----- Message ----- From: Daneen Schick Sent: 01/09/2019  10:14 AM EDT To: [name], FNP Subject: pt had a fall                                  Good morning! Sending an [place] patient had a fall yesterday and is sore to her right side. She said she is taking it easy today and adiminstering tylenol for pain. I have encouraged her to contact the office if pain worsens or persists after a few days.  Thanks, Tillie Rung

## 2019-01-15 NOTE — Progress Notes (Signed)
  Chronic Care Management   Outreach Note  01/14/2019 Name: Brandi [name] MRN: [id number] DOB: June 02, 1955  Referred by: [name], FNP Reason for referral : No chief complaint on file.   An unsuccessful telephone outreach was attempted today. The patient was referred to the case management team by for assistance with care management and care coordination.   Follow Up Plan: A HIPPA compliant phone message was left for the patient providing contact information and requesting a return call.  The care management team will reach out to the patient again over the next 10-14 days.   SIGNATURE Regina Eck, PharmD, BCPS Clinical Pharmacist, [facility] [place]: [phone]

## 2019-01-22 ENCOUNTER — Telehealth: Payer: Self-pay

## 2019-01-23 ENCOUNTER — Ambulatory Visit: Payer: Self-pay | Admitting: Pharmacist

## 2019-01-23 DIAGNOSIS — R7303 Prediabetes: Secondary | ICD-10-CM

## 2019-01-23 DIAGNOSIS — Z79899 Other long term (current) drug therapy: Secondary | ICD-10-CM

## 2019-01-23 DIAGNOSIS — I10 Essential (primary) hypertension: Secondary | ICD-10-CM

## 2019-01-27 ENCOUNTER — Other Ambulatory Visit: Payer: Self-pay

## 2019-01-27 ENCOUNTER — Encounter: Payer: Self-pay | Admitting: Interventional Cardiology

## 2019-01-27 ENCOUNTER — Ambulatory Visit (INDEPENDENT_AMBULATORY_CARE_PROVIDER_SITE_OTHER): Payer: Medicare Other | Admitting: Interventional Cardiology

## 2019-01-27 VITALS — BP 138/104 | HR 86 | Ht 63.2 in | Wt 325.8 lb

## 2019-01-27 DIAGNOSIS — R0602 Shortness of breath: Secondary | ICD-10-CM

## 2019-01-27 DIAGNOSIS — R609 Edema, unspecified: Secondary | ICD-10-CM

## 2019-01-27 DIAGNOSIS — Z7189 Other specified counseling: Secondary | ICD-10-CM

## 2019-01-27 DIAGNOSIS — G4733 Obstructive sleep apnea (adult) (pediatric): Secondary | ICD-10-CM

## 2019-01-27 DIAGNOSIS — I110 Hypertensive heart disease with heart failure: Secondary | ICD-10-CM

## 2019-01-27 DIAGNOSIS — Z889 Allergy status to unspecified drugs, medicaments and biological substances status: Secondary | ICD-10-CM | POA: Diagnosis not present

## 2019-01-27 NOTE — Patient Instructions (Addendum)
Medication Instructions:  Your physician recommends that you continue on your current medications as directed. Please refer to the Current Medication list given to you today.  *If you need a refill on your cardiac medications before your next appointment, please call your pharmacy*  Lab Work: BMET, CBC and Pro BNP today  If you have labs (blood work) drawn today and your tests are completely normal, you will receive your results only by: Marland Kitchen MyChart Message (if you have MyChart) OR . A paper copy in the mail If you have any lab test that is abnormal or we need to change your treatment, we will call you to review the results.  Testing/Procedures: Your physician has requested that you have an echocardiogram. Echocardiography is a painless test that uses sound waves to create images of your heart. It provides your doctor with information about the size and shape of your heart and how well your heart's chambers and valves are working. This procedure takes approximately one hour. There are no restrictions for this procedure.   Follow-Up: At [facility], you and your health needs are our priority.  As part of our continuing mission to provide you with exceptional heart care, we have created designated Provider Care Teams.  These Care Teams include your primary Cardiologist (physician) and Advanced Practice Providers (APPs -  Physician Assistants and Nurse Practitioners) who all work together to provide you with the care you need, when you need it.  Your next appointment:   1 month (can have 12/4 at 1:20pm or 12/9 at 11:40am)  The format for your next appointment:   In Person  Provider:   You may see Dr. Daneen Schick or one of the following Advanced Practice Providers on your designated Care Team:    Truitt Merle, NP  Cecilie Kicks, NP  Kathyrn Drown, NP   Other Instructions

## 2019-01-27 NOTE — Progress Notes (Signed)
Cardiology Office Note:    Date:  01/27/2019   ID:  Brandi [name], DOB 03-Nov-1955, MRN [id number]  PCP:  [name], FNP  Cardiologist:  No primary care provider on file.   Referring MD: [name], FNP   Chief Complaint  Patient presents with  . Congestive Heart Failure  . Hypertension    History of Present Illness:    Brandi [name] is a 63 y.o. female with a hx of severe hypertension, hypertensive heart disease, congestive heart failure, obstructive sleep apnea, multiple medication intolerances, significant bilateral lower extremity edema, and morbid obesity.  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  Prior cardiologist Sherren Kerns, MD at [facility] in [place].  The patient is accompanied by her daughter.  She is significantly overweight.  She is now living in [place] and wants to establish for longitudinal cardiology care.  We do not have records from her prior cardiologist.  She thought she had made provisions to have them sent to [name].  She states that Dr. Damita Dunnings had been treating her for impending heart failure.  Her medication list includes multiple intolerances, unfortunately including spironolactone, furosemide, and hydrochlorothiazide.  She is not particularly short of breath today.  She feels stable.  She is concerned about lower extremity swelling.  She is accompanied by her daughter.  She is able to lie down without dyspnea.  She has sleep apnea but does not use CPAP.  She denies palpitations, syncope, and PND.  Past Medical History:  Diagnosis Date  . Arthritis   . Hypertension   . Hypertensive crisis   . Osteoporosis   . Rheumatoid arthritis ([place])   . Sleep apnea     Past Surgical History:  Procedure Laterality Date  . ABDOMINAL HYSTERECTOMY     PARTIAL  . CHOLECYSTECTOMY    . PROTESTIC HIPS    . PROTESTIC KNEE      Current Medications: Current Meds  Medication Sig  .  amLODipine (NORVASC) 10 MG tablet Take 10 mg by mouth daily.  Marland Kitchen atorvastatin (LIPITOR) 40 MG tablet TK 1 T PO QD  . BYSTOLIC 20 MG TABS TK 1 T PO QD  . cetirizine (ZYRTEC) 10 MG tablet Take 10 mg by mouth daily.  . Cholecalciferol (VITAMIN D) 50 MCG (2000 UT) CAPS Take 1 capsule by mouth daily.  . cloNIDine (CATAPRES) 0.3 MG tablet Take 0.3 mg by mouth daily. 1/2 tablet at bedtime  . cyclobenzaprine (FLEXERIL) 10 MG tablet 10 mg 3 (three) times daily.   . fluticasone (FLONASE ALLERGY RELIEF) 50 MCG/ACT nasal spray Flonase Allergy Relief 50 mcg/actuation nasal spray,suspension  INSTILL 2 SPRAYS IN EACH NOSTRIL QD  . gabapentin (NEURONTIN) 300 MG capsule Take 300 mg by mouth daily.   . hydrocortisone cream 0.5 % 1 application as needed.   . Magnesium 500 MG TABS Take 1 tablet by mouth daily.  Marland Kitchen rOPINIRole (REQUIP) 1 MG tablet ropinirole 1 mg tablet  . topiramate (TOPAMAX) 25 MG tablet Take 1 tablet (25 mg total) by mouth at bedtime.  . Vitamin E 180 MG CAPS Take 1 capsule by mouth daily.     Allergies:   Hydralazine, Meperidine hcl, Aliskiren, Aspirin, Latex, Oxycodone-acetaminophen, Rofecoxib, Spironolactone, Furosemide, and Sulfa antibiotics   Social History   Socioeconomic History  . Marital status: Single    Spouse name: Not on file  . Number of children: Not on file  . Years of education: Not on file  .  Highest education level: Not on file  Occupational History  . Not on file  Social Needs  . Financial resource strain: Not very hard  . Food insecurity    Worry: Never true    Inability: Never true  . Transportation needs    Medical: No    Non-medical: No  Tobacco Use  . Smoking status: Never Smoker  . Smokeless tobacco: Never Used  Substance and Sexual Activity  . Alcohol use: Not Currently  . Drug use: Not Currently  . Sexual activity: Not on file  Lifestyle  . Physical activity    Days per week: Not on file    Minutes per session: Not on file  . Stress: Only a  little  Relationships  . Social connections    Talks on phone: More than three times a week    Gets together: More than three times a week    Attends religious service: 1 to 4 times per year    Active member of club or organization: No    Attends meetings of clubs or organizations: Never    Relationship status: Not on file  Other Topics Concern  . Not on file  Social History Narrative  . Not on file     Family History: The patient's family history includes Cancer in her mother; Diabetes in her father; Heart disease in her father and mother; Hypertension in her mother and sister; Kidney disease in her father.  ROS:   Please see the history of present illness.    Morbid obesity, decreased ability to ambulate due to obesity involving the peroneal area and her joints.  No history of diabetes.  Does have a family history of kidney failure and dialysis (father).  2 sisters have hypertension.  She smoked but discontinued cigarettes 30 years ago.  She wonders if amlodipine can be discontinued because she feels it contributes to peripheral edema.  All other systems reviewed and are negative.  EKGs/Labs/Other Studies Reviewed:    The following studies were reviewed today: No cardiac data  EKG:  EKG performed today on January 27, 2019 and demonstrates normal sinus rhythm with normal PR and otherwise normal appearance  Recent Labs: 09/26/2018: TSH 2.700 09/30/2018: Hemoglobin 11.8; Platelets 255 01/06/2019: ALT 23; BUN 8; Creatinine, Ser 0.77; Potassium 3.8; Sodium 143  Recent Lipid Panel    Component Value Date/Time   CHOL 168 01/06/2019 1225   TRIG 110 01/06/2019 1225   HDL 51 01/06/2019 1225   CHOLHDL 3.3 01/06/2019 1225   LDLCALC 97 01/06/2019 1225    Physical Exam:    VS:  BP (!) 138/104   Pulse 86   Ht 5' 3.2" (1.605 m)   Wt (!) 325 lb 12.8 oz (147.8 kg)   SpO2 97%   BMI 57.35 kg/m     Wt Readings from Last 3 Encounters:  01/27/19 (!) 325 lb 12.8 oz (147.8 kg)  01/06/19  (!) 315 lb 12.8 oz (143.2 kg)  12/04/18 (!) 319 lb (144.7 kg)     GEN: Morbidly obese. No acute distress HEENT: Normal NECK: Unable to see neck veins because of short neck. LYMPHATICS: No lymphadenopathy CARDIAC:  RRR without murmur, gallop, but there is pitting edema from the dorsum of the feet to the knee.  She states that swelling is not as taut first thing in the morning but rapidly increases in size as the day goes along. VASCULAR: _0 Normal Pulses. No bruits. RESPIRATORY:  Clear to auscultation without rales, wheezing or rhonchi  ABDOMEN: Soft, non-tender, non-distended, No pulsatile mass, MUSCULOSKELETAL: No deformity  SKIN: Warm and dry NEUROLOGIC:  Alert and oriented x 3 PSYCHIATRIC:  Normal affect   ASSESSMENT:    1. Hypertensive heart disease with heart failure ([place])   2. Peripheral edema   3. Multiple drug allergies   4. SOB (shortness of breath)   5. Morbid obesity ([place])   6. Obstructive sleep apnea   7. Educated about COVID-19 virus infection    PLAN:    In order of problems listed above:  1. Blood pressure is not as well-controlled today as we would like.  My initial instinct is to start a diuretic.  We considered eplerenone.  After discussion with the patient we have decided to cut back on salt and fluid intake, get records from Dr. Damita Dunnings, and see the patient again in 2 to 4 weeks at which time I would have had an opportunity to review her medication intolerances.  She would be very difficult to treat if diuretic therapy is not possible. 2. Multifactorial peripheral edema likely related to pulmonary hypertension from obstructive sleep apnea, venous insufficiency, amlodipine, and other potential factors.  A BNP, C-Met, and hemoglobin will be obtained today. 3. Hydrochlorothiazide, furosemide, spironolactone are all listed as intolerances.  The intolerance is skin rash/itching.  She states that she is allergic to sulfa. 4. Chronic and likely multifactorial related to  weight/sleep apnea/possible diastolic heart failure. 5. Encourage CPAP 6. There are 3W's are endorsed.  Get records from [facility] in [place],/Dr. Damita Dunnings.  We need to be able to review intolerances before making changes.  The patient concurs with this approach.  Echocardiogram, C-Met,, CBC, and BMP will be obtained.   Medication Adjustments/Labs and Tests Ordered: Current medicines are reviewed at length with the patient today.  Concerns regarding medicines are outlined above.  Orders Placed This Encounter  Procedures  . Basic metabolic panel  . CBC  . Pro b natriuretic peptide  . EKG 12-Lead  . ECHOCARDIOGRAM COMPLETE   No orders of the defined types were placed in this encounter.   Patient Instructions  Medication Instructions:  Your physician recommends that you continue on your current medications as directed. Please refer to the Current Medication list given to you today.  *If you need a refill on your cardiac medications before your next appointment, please call your pharmacy*  Lab Work: BMET, CBC and Pro BNP today  If you have labs (blood work) drawn today and your tests are completely normal, you will receive your results only by: Marland Kitchen MyChart Message (if you have MyChart) OR . A paper copy in the mail If you have any lab test that is abnormal or we need to change your treatment, we will call you to review the results.  Testing/Procedures: Your physician has requested that you have an echocardiogram. Echocardiography is a painless test that uses sound waves to create images of your heart. It provides your doctor with information about the size and shape of your heart and how well your heart's chambers and valves are working. This procedure takes approximately one hour. There are no restrictions for this procedure.   Follow-Up: At [facility], you and your health needs are our priority.  As part of our continuing mission to provide you  with exceptional heart care, we have created designated Provider Care Teams.  These Care Teams include your primary Cardiologist (physician) and Advanced Practice Providers (APPs -  Physician Assistants and Nurse Practitioners) who all work  together to provide you with the care you need, when you need it.  Your next appointment:   1 month (can have 12/4 at 1:20pm or 12/9 at 11:40am)  The format for your next appointment:   In Person  Provider:   You may see Dr. Daneen Schick or one of the following Advanced Practice Providers on your designated Care Team:    Truitt Merle, NP  Cecilie Kicks, NP  Kathyrn Drown, NP   Other Instructions      Signed, Sinclair Grooms, MD  01/27/2019 4:32 PM    [facility]`

## 2019-01-28 ENCOUNTER — Telehealth: Payer: Self-pay | Admitting: Interventional Cardiology

## 2019-01-28 LAB — CBC
Hematocrit: 38.4 % (ref 34.0–46.6)
Hemoglobin: 12.4 g/dL (ref 11.1–15.9)
MCH: 29 pg (ref 26.6–33.0)
MCHC: 32.3 g/dL (ref 31.5–35.7)
MCV: 90 fL (ref 79–97)
Platelets: 275 10*3/uL (ref 150–450)
RBC: 4.28 x10E6/uL (ref 3.77–5.28)
RDW: 14.3 % (ref 11.7–15.4)
WBC: 7.2 10*3/uL (ref 3.4–10.8)

## 2019-01-28 LAB — BASIC METABOLIC PANEL
BUN/Creatinine Ratio: 10 — ABNORMAL LOW (ref 12–28)
BUN: 8 mg/dL (ref 8–27)
CO2: 26 mmol/L (ref 20–29)
Calcium: 10.1 mg/dL (ref 8.7–10.3)
Chloride: 107 mmol/L — ABNORMAL HIGH (ref 96–106)
Creatinine, Ser: 0.77 mg/dL (ref 0.57–1.00)
GFR calc Af Amer: 94 mL/min/{1.73_m2} (ref >59)
GFR calc non Af Amer: 81 mL/min/{1.73_m2} (ref >59)
Glucose: 102 mg/dL — ABNORMAL HIGH (ref 65–99)
Potassium: 4.7 mmol/L (ref 3.5–5.2)
Sodium: 143 mmol/L (ref 134–144)

## 2019-01-28 LAB — PRO B NATRIURETIC PEPTIDE: NT-Pro BNP: 13 pg/mL (ref 0–301)

## 2019-01-28 NOTE — Telephone Encounter (Signed)
Medical records requested from Dr. Cecilio Asper - [facility]. 01/28/19 vlm

## 2019-01-28 NOTE — Progress Notes (Signed)
Chronic Care Management   Initial Visit Note  01/23/2019 Name: Brandi [name] MRN: [id number] DOB: Apr 04, 1955  Referred by: [name], FNP Reason for referral : Chronic Care Management   Brandi [name] is a 63 y.o. year old female who is a primary care patient of [name], [place]. The CCM team was consulted for assistance with chronic disease management and care coordination needs related to HTN and HLD  Review of patient status, including review of consultants reports, relevant laboratory and other test results, and collaboration with appropriate care team members and the patient's provider was performed as part of comprehensive patient evaluation and provision of chronic care management services.    I spoke with Brandi [name] by telephone today.  Advanced Directives Status: N See Care Plan and Vynca application for related entries.   Medications: Outpatient Encounter Medications as of 01/23/2019  Medication Sig Note  . amLODipine (NORVASC) 10 MG tablet Take 10 mg by mouth daily.   Marland Kitchen atorvastatin (LIPITOR) 40 MG tablet TK 1 T PO QD   . BYSTOLIC 20 MG TABS TK 1 T PO QD   . cetirizine (ZYRTEC) 10 MG tablet Take 10 mg by mouth daily.   . Cholecalciferol (VITAMIN D) 50 MCG (2000 UT) CAPS Take 1 capsule by mouth daily.   . cloNIDine (CATAPRES) 0.3 MG tablet Take 0.3 mg by mouth daily. 1/2 tablet at bedtime   . cyclobenzaprine (FLEXERIL) 10 MG tablet 10 mg 3 (three) times daily.  01/28/2019: Taking twice daily  . fluticasone (FLONASE ALLERGY RELIEF) 50 MCG/ACT nasal spray Flonase Allergy Relief 50 mcg/actuation nasal spray,suspension  INSTILL 2 SPRAYS IN EACH NOSTRIL QD   . gabapentin (NEURONTIN) 300 MG capsule Take 300 mg by mouth daily.    . hydrocortisone cream 0.5 % 1 application as needed.    . Magnesium 500 MG TABS Take 1 tablet by mouth daily.   Marland Kitchen rOPINIRole (REQUIP) 1 MG tablet ropinirole 1 mg tablet   . topiramate (TOPAMAX) 25 MG tablet Take 1 tablet (25 mg total) by mouth at  bedtime.   . Vitamin E 180 MG CAPS Take 1 capsule by mouth daily.   . [DISCONTINUED] PRESCRIPTION MEDICATION 1 tablet 3 (three) times daily. sciaticlear 5 pills TID   . [DISCONTINUED] therapeutic multivitamin-minerals (THERAGRAN-M) tablet multivitamin with iron tablet   . [DISCONTINUED] traMADol (ULTRAM) 50 MG tablet tramadol 50 mg tablet    No facility-administered encounter medications on file as of 01/23/2019.      Objective:   Goals Addressed            This Visit's Progress     Patient Stated   . I would like to get better control of my blood pressure so I can feel better (pt-stated)       Current Barriers:  . Uncontrolled hypertension, complicated by obesity, hyperlipidemia . Current antihypertensive regimen: amlodipine 10mg , bystolic, clonidine  . Previous antihypertensives tried: hydralazine, spironolactone . Current allergy/intolerance list:  o spironolactone, furosemide, and hydrochlorothiazide o Sulfa allergy  . Current home BP readings: 130-140s/80-100s . Patient complaining of edema.  Likely causes amlodipine vs venous statsis vs cardiac . Patient does not like how she feels on her current medications.  Unfortunately, there are limited options when it comes to her anti-hypertensive regimen.  Pharmacist Clinical Goal(s):  Marland Kitchen Over the next 90 days, patient will work with PharmD and providers to optimize antihypertensive regimen  Interventions: . Comprehensive medication review performed; medication list updated in the electronic medical record.  o Patient  does not like that she is on so many medications, however she states she needs them all.  Noted that patient could potentially reduce her cyclobenzaprine dose to 5mg  since she is complaining of drowsiness.  She is not willing to decrease at this time.  Will revisit . Counseled on the importance of a low salt diet; exercising as able to improve blood pressure . Patient to see cardiology next week.  I will follow up  with her in 2-3 weeks regarding any changes.   Patient Self Care Activities:  . Patient will continue to check BP daily , document, and provide at future appointments . Patient will focus on medication adherence by continuing to take medications as prescribed.  Initial goal documentation          Plan:   The care management team will reach out to the patient again over the next 30 days.     Provider Signature Regina Eck, PharmD, BCPS Clinical Pharmacist, [facility] [place]: [phone]

## 2019-01-28 NOTE — Patient Instructions (Signed)
Visit Information  Goals Addressed            This Visit's Progress     Patient Stated   . I would like to get better control of my blood pressure so I can feel better (pt-stated)       Current Barriers:  . Uncontrolled hypertension, complicated by obesity, hyperlipidemia . Current antihypertensive regimen: amlodipine 10mg , bystolic, clonidine  . Previous antihypertensives tried: hydralazine, spironolactone . Current allergy/intolerance list:  o spironolactone, furosemide, and hydrochlorothiazide o Sulfa allergy  . Current home BP readings: 130-140s/80-100s . Patient complaining of edema.  Likely causes amlodipine vs venous statsis vs cardiac . Patient does not like how she feels on her current medications.  Unfortunately, there are limited options when it comes to her anti-hypertensive regimen.  Pharmacist Clinical Goal(s):  Marland Kitchen Over the next 90 days, patient will work with PharmD and providers to optimize antihypertensive regimen  Interventions: . Comprehensive medication review performed; medication list updated in the electronic medical record.  o Patient does not like that she is on so many medications, however she states she needs them all.  Noted that patient could potentially reduce her cyclobenzaprine dose to 5mg  since she is complaining of drowsiness.  She is not willing to decrease at this time.  Will revisit . Counseled on the importance of a low salt diet; exercising as able to improve blood pressure . Patient to see cardiology next week.  I will follow up with her in 2-3 weeks regarding any changes.   Patient Self Care Activities:  . Patient will continue to check BP daily , document, and provide at future appointments . Patient will focus on medication adherence by continuing to take medications as prescribed.  Initial goal documentation        The patient verbalized understanding of instructions provided today and declined a print copy of patient instruction  materials.   The care management team will reach out to the patient again over the next 30 days.   SIGNATURE Regina Eck, PharmD, BCPS Clinical Pharmacist, [facility] [place]: [phone]

## 2019-01-29 ENCOUNTER — Telehealth: Payer: Self-pay | Admitting: Interventional Cardiology

## 2019-01-29 NOTE — Telephone Encounter (Signed)
See lab results, pt notified

## 2019-01-29 NOTE — Telephone Encounter (Signed)
New Message    Pt is calling back regarding Lab results  Please call back

## 2019-02-06 ENCOUNTER — Telehealth: Payer: Self-pay

## 2019-02-10 ENCOUNTER — Other Ambulatory Visit: Payer: Self-pay

## 2019-02-10 ENCOUNTER — Ambulatory Visit (HOSPITAL_COMMUNITY): Payer: Medicare Other | Attending: Interventional Cardiology

## 2019-02-10 DIAGNOSIS — R0602 Shortness of breath: Secondary | ICD-10-CM | POA: Insufficient documentation

## 2019-02-10 MED ORDER — PERFLUTREN LIPID MICROSPHERE
1.0000 mL | INTRAVENOUS | Status: AC | PRN
  Administered 2019-02-10: 2 mL via INTRAVENOUS

## 2019-02-11 ENCOUNTER — Other Ambulatory Visit: Payer: Self-pay | Admitting: Nurse Practitioner

## 2019-02-11 ENCOUNTER — Other Ambulatory Visit: Payer: Self-pay | Admitting: Neurology

## 2019-02-11 MED ORDER — TOPIRAMATE 25 MG PO TABS: 25.0000 mg | ORAL_TABLET | Freq: Every day | ORAL | 1 refills | Status: DC

## 2019-02-11 NOTE — Telephone Encounter (Signed)
Hi Yes I can take care of this.  Thanks

## 2019-02-12 ENCOUNTER — Telehealth: Payer: Self-pay

## 2019-02-12 ENCOUNTER — Ambulatory Visit: Payer: Self-pay

## 2019-02-12 DIAGNOSIS — I10 Essential (primary) hypertension: Secondary | ICD-10-CM

## 2019-02-12 DIAGNOSIS — R7303 Prediabetes: Secondary | ICD-10-CM

## 2019-02-12 NOTE — Chronic Care Management (AMB) (Signed)
  Chronic Care Management   Outreach Note  02/12/2019 Name: Brandi [name] MRN: [id number] DOB: 1955/12/24  Referred by: [name], FNP Reason for referral : Care Coordination   SW placed an unsuccessful outbound call to the patient to assess progression of patient goals and assist with care coordination needs. SW left a HIPAA compliant voice message requesting a return call.  Follow Up Plan: The care management team will reach out to the patient again over the next 21 days.   Daneen Schick, BSW, CDP Social Worker, Certified Dementia Practitioner [place] [facility] [place] [facility] [phone]

## 2019-02-12 NOTE — Telephone Encounter (Signed)
Thank you for your respond.

## 2019-02-24 ENCOUNTER — Ambulatory Visit: Payer: Self-pay | Admitting: Pharmacist

## 2019-02-24 NOTE — Progress Notes (Signed)
  Chronic Care Management   Outreach Note  02/24/2019 Name: Brandi [name] MRN: [id number] DOB: [birth year]  Referred by: [name], FNP Reason for referral : Chronic Care Management   An unsuccessful telephone outreach was attempted today. The patient was referred to the case management team by for assistance with care management and care coordination.   Follow Up Plan: A HIPPA compliant phone message was left for the patient providing contact information and requesting a return call.  The care management team will reach out to the patient again over the next 14 days.   SIGNATURE Regina Eck, PharmD, BCPS Clinical Pharmacist, [facility] [place]: [phone]

## 2019-02-27 NOTE — Progress Notes (Signed)
Cardiology Office Note:    Date:  02/28/2019   ID:  Brandi [name], DOB Jun 10, 1955, MRN [id number]  PCP:  [name], FNP  Cardiologist:  No primary care provider on file.   Referring MD: [name], FNP   Chief Complaint  Patient presents with  . Hypertension  . Congestive Heart Failure    History of Present Illness:    Brandi [name] is a 63 y.o. female with a hx of  severe hypertension, hypertensive heart disease, congestive heart failure, obstructive sleep apnea, multiple medication intolerances, significant bilateral lower extremity edema, and morbid obesity.    She is back today for clinical follow-up.  She has no specific complaints.  She is accompanied by her daughter, the [facility].  To help her mother get situated and to have better health.  We discussed preventive measures including decreasing carbohydrate in the diet, 30 minutes of moderate physical activity 5 out of the 7 days of the week, limiting salt in the diet, adding more vegetables to the diet, 6 hours of sleep, lower extremity elevation when sitting, and compliance with her current medication regimen.  Echocardiogram performed by [name] by enlarge looked pretty good.  There was no evidence of significant pulmonary hypertension although an estimate was technically difficult.  We discussed the multifactorial etiology of the lower extremity swelling, related to obstructive sleep apnea, amlodipine high-dose, diastolic dysfunction, and possibly varicose veins.  Past Medical History:  Diagnosis Date  . Arthritis   . Hypertension   . Hypertensive crisis   . Osteoporosis   . Rheumatoid arthritis ([place])   . Sleep apnea     Past Surgical History:  Procedure Laterality Date  . ABDOMINAL HYSTERECTOMY     PARTIAL  . CHOLECYSTECTOMY    . PROTESTIC HIPS    . PROTESTIC KNEE      Current Medications: Current Meds  Medication Sig  . amLODipine (NORVASC) 10 MG tablet Take 10 mg by mouth daily.  Marland Kitchen  atorvastatin (LIPITOR) 40 MG tablet TK 1 T PO QD  . BYSTOLIC 20 MG TABS TK 1 T PO QD  . cetirizine (ZYRTEC) 10 MG tablet Take 10 mg by mouth daily.  . Cholecalciferol (VITAMIN D) 50 MCG (2000 UT) CAPS Take 1 capsule by mouth daily.  . cloNIDine (CATAPRES) 0.3 MG tablet Take 0.3 mg by mouth daily. 1/2 tablet at bedtime  . cyclobenzaprine (FLEXERIL) 10 MG tablet 10 mg 3 (three) times daily.   . fluticasone (FLONASE ALLERGY RELIEF) 50 MCG/ACT nasal spray Flonase Allergy Relief 50 mcg/actuation nasal spray,suspension  INSTILL 2 SPRAYS IN EACH NOSTRIL QD  . gabapentin (NEURONTIN) 300 MG capsule Take 300 mg by mouth daily.   . hydrocortisone cream 0.5 % 1 application as needed.   . Magnesium 500 MG TABS Take 1 tablet by mouth daily.  Marland Kitchen rOPINIRole (REQUIP) 1 MG tablet Take 1 mg by mouth 2 (two) times daily.   Marland Kitchen topiramate (TOPAMAX) 25 MG tablet Take 1 tablet (25 mg total) by mouth daily.  . Vitamin E 180 MG CAPS Take 1 capsule by mouth daily.     Allergies:   Hydralazine, Meperidine hcl, [place]ren, Aspirin, Latex, Oxycodone-acetaminophen, Rofecoxib, Spironolactone, Furosemide, and Sulfa antibiotics   Social History   Socioeconomic History  . Marital status: Single    Spouse name: Not on file  . Number of children: Not on file  . Years of education: Not on file  . Highest education level: Not on file  Occupational History  . Not on  file  Social Needs  . Financial resource strain: Not very hard  . Food insecurity    Worry: Never true    Inability: Never true  . Transportation needs    Medical: No    Non-medical: No  Tobacco Use  . Smoking status: Never Smoker  . Smokeless tobacco: Never Used  Substance and Sexual Activity  . Alcohol use: Not Currently  . Drug use: Not Currently  . Sexual activity: Not on file  Lifestyle  . Physical activity    Days per week: Not on file    Minutes per session: Not on file  . Stress: Only a little  Relationships  . Social connections    Talks  on phone: More than three times a week    Gets together: More than three times a week    Attends religious service: 1 to 4 times per year    Active member of club or organization: No    Attends meetings of clubs or organizations: Never    Relationship status: Not on file  Other Topics Concern  . Not on file  Social History Narrative  . Not on file     Family History: The patient's family history includes Cancer in her mother; Diabetes in her father; Heart disease in her father and mother; Hypertension in her mother and sister; Kidney disease in her father.  ROS:   Please see the history of present illness.    No specific complaints today.  She will probably not be here if her daughter were not proactive for her health.  All other systems reviewed and are negative.  EKGs/Labs/Other Studies Reviewed:    The following studies were reviewed today:  2D Doppler echocardiogram performed 02/10/2019: IMPRESSIONS    1. Left ventricular ejection fraction, by visual estimation, is 55 to 60%. The left ventricle has normal function. Left ventricular septal wall thickness was mildly increased. Mildly increased left ventricular posterior wall thickness.  2. Left ventricular diastolic parameters are consistent with Grade I diastolic dysfunction (impaired relaxation).  3. Definity contrast agent was given IV to delineate the left ventricular endocardial borders.  4. Global right ventricle was not well visualized.The right ventricular size is not well visualized. Right vetricular wall thickness was not assessed.  5. Left atrial size was not well visualized.  6. Right atrial size was not well visualized.  7. The mitral valve was not well visualized. No evidence of mitral valve regurgitation. No evidence of mitral stenosis.  8. The tricuspid valve is not well visualized. Tricuspid valve regurgitation is not demonstrated.  9. The aortic valve was not well visualized. Aortic valve regurgitation is not  visualized. 10. The pulmonic valve was not well visualized. Pulmonic valve regurgitation is not visualized. 11. The inferior vena cava is normal in size with greater than 50% respiratory variability, suggesting right atrial pressure of 3 mmHg.  EKG:  EKG no new data.  Recent Labs: 09/26/2018: TSH 2.700 01/06/2019: ALT 23 01/27/2019: BUN 8; Creatinine, Ser 0.77; Hemoglobin 12.4; NT-Pro BNP 13; Platelets 275; Potassium 4.7; Sodium 143  Recent Lipid Panel    Component Value Date/Time   CHOL 168 01/06/2019 1225   TRIG 110 01/06/2019 1225   HDL 51 01/06/2019 1225   CHOLHDL 3.3 01/06/2019 1225   LDLCALC 97 01/06/2019 1225    Physical Exam:    VS:  BP 138/86   Pulse 96   Ht 5' 3.2" (1.605 m)   Wt (!) 329 lb 12.8 oz (149.6 kg)  SpO2 97%   BMI 58.05 kg/m     Wt Readings from Last 3 Encounters:  02/28/19 (!) 329 lb 12.8 oz (149.6 kg)  01/27/19 (!) 325 lb 12.8 oz (147.8 kg)  01/06/19 (!) 315 lb 12.8 oz (143.2 kg)     GEN: Morbid obesity. No acute distress HEENT: Normal NECK: No JVD. LYMPHATICS: No lymphadenopathy CARDIAC:  RRR without murmur, gallop, or edema. VASCULAR:  Normal Pulses. No bruits. RESPIRATORY:  Clear to auscultation without rales, wheezing or rhonchi  ABDOMEN: Soft, non-tender, non-distended, No pulsatile mass, MUSCULOSKELETAL: No deformity  SKIN: Warm and dry NEUROLOGIC:  Alert and oriented x 3 PSYCHIATRIC:  Normal affect   ASSESSMENT:    1. Hypertensive heart disease with heart failure ([place])   2. Morbid obesity ([place])   3. Obstructive sleep apnea   4. Educated about COVID-19 virus infection    PLAN:    In order of problems listed above:  1. Blood pressure is adequately controlled.  Grade 1 diastolic dysfunction without severe left ventricular hypertrophy or evidence of right heart enlargement on echo.  Continue current antihypertensive regimen.  Lower extremity edema would likely improve to some degree with diuretic therapy and if amlodipine dose could  be decreased. 2. Discussed diet and weight loss in conjunction with aerobic activity. 3. CPAP is used to comply with therapy for sleep apnea. 4. The 3W's is practiced to avoid COVID-19 infection.  Overall education and awareness concerning primary risk prevention was discussed in detail: LDL less than 70, hemoglobin A1c less than 7, blood pressure target less than 130/80 mmHg, >150 minutes of moderate aerobic activity per week, avoidance of smoking, weight control (via diet and exercise), and continued surveillance/management of/for obstructive sleep apnea.    Medication Adjustments/Labs and Tests Ordered: Current medicines are reviewed at length with the patient today.  Concerns regarding medicines are outlined above.  No orders of the defined types were placed in this encounter.  No orders of the defined types were placed in this encounter.   There are no Patient Instructions on file for this visit.   Signed, Sinclair Grooms, MD  02/28/2019 2:07 PM    [place]

## 2019-02-28 ENCOUNTER — Telehealth: Payer: Self-pay

## 2019-02-28 ENCOUNTER — Other Ambulatory Visit: Payer: Self-pay

## 2019-02-28 ENCOUNTER — Ambulatory Visit: Payer: Self-pay

## 2019-02-28 ENCOUNTER — Ambulatory Visit (INDEPENDENT_AMBULATORY_CARE_PROVIDER_SITE_OTHER): Payer: Medicare Other | Admitting: Interventional Cardiology

## 2019-02-28 ENCOUNTER — Encounter: Payer: Self-pay | Admitting: Interventional Cardiology

## 2019-02-28 VITALS — BP 138/86 | HR 96 | Ht 63.2 in | Wt 329.8 lb

## 2019-02-28 DIAGNOSIS — Z7189 Other specified counseling: Secondary | ICD-10-CM | POA: Diagnosis not present

## 2019-02-28 DIAGNOSIS — G4733 Obstructive sleep apnea (adult) (pediatric): Secondary | ICD-10-CM | POA: Diagnosis not present

## 2019-02-28 DIAGNOSIS — I10 Essential (primary) hypertension: Secondary | ICD-10-CM

## 2019-02-28 DIAGNOSIS — I110 Hypertensive heart disease with heart failure: Secondary | ICD-10-CM

## 2019-02-28 MED ORDER — CLONIDINE HCL 0.3 MG PO TABS: 0.3000 mg | ORAL_TABLET | Freq: Every day | ORAL | 3 refills | Status: DC

## 2019-02-28 MED ORDER — BYSTOLIC 20 MG PO TABS: ORAL_TABLET | ORAL | 3 refills | Status: DC

## 2019-02-28 MED ORDER — AMLODIPINE BESYLATE 10 MG PO TABS: 10.0000 mg | ORAL_TABLET | Freq: Every day | ORAL | 3 refills | Status: DC

## 2019-02-28 MED ORDER — ATORVASTATIN CALCIUM 40 MG PO TABS: ORAL_TABLET | ORAL | 3 refills | Status: DC

## 2019-02-28 NOTE — Chronic Care Management (AMB) (Signed)
  Chronic Care Management   Outreach Note  02/28/2019 Name: Brandi [name] MRN: [id number] DOB: 1955/11/23  Referred by: [name], FNP Reason for referral : Care Coordination   Second unsuccessful outbound call to the patient by SW to assess progression of patient goal and assist with care coordination needs. SW left a HIPAA compliant voice message requesting a return call.  Follow Up Plan: The care management team will reach out to the patient again over the next 21 days.   Daneen Schick, BSW, CDP Social Worker, Certified Dementia Practitioner [place] [facility] [place] [facility] [phone]

## 2019-02-28 NOTE — Patient Instructions (Signed)
Medication Instructions:  Your physician recommends that you continue on your current medications as directed. Please refer to the Current Medication list given to you today.  *If you need a refill on your cardiac medications before your next appointment, please call your pharmacy*  Lab Work: None If you have labs (blood work) drawn today and your tests are completely normal, you will receive your results only by: Marland Kitchen MyChart Message (if you have MyChart) OR . A paper copy in the mail If you have any lab test that is abnormal or we need to change your treatment, we will call you to review the results.  Testing/Procedures: None  Follow-Up: At [facility], you and your health needs are our priority.  As part of our continuing mission to provide you with exceptional heart care, we have created designated Provider Care Teams.  These Care Teams include your primary Cardiologist (physician) and Advanced Practice Providers (APPs -  Physician Assistants and Nurse Practitioners) who all work together to provide you with the care you need, when you need it.  Your next appointment:   6 month(s)  The format for your next appointment:   In Person  Provider:   You may see Dr. Daneen Schick or one of the following Advanced Practice Providers on your designated Care Team:    Truitt Merle, NP  Cecilie Kicks, NP  Kathyrn Drown, NP   Other Instructions

## 2019-03-11 ENCOUNTER — Other Ambulatory Visit: Payer: Self-pay

## 2019-03-11 ENCOUNTER — Encounter: Payer: Self-pay | Admitting: Nurse Practitioner

## 2019-03-11 ENCOUNTER — Ambulatory Visit (INDEPENDENT_AMBULATORY_CARE_PROVIDER_SITE_OTHER): Payer: Medicare Other | Admitting: Nurse Practitioner

## 2019-03-11 VITALS — BP 132/86 | HR 94 | Temp 98.4°F | Ht 63.2 in | Wt 232.6 lb

## 2019-03-11 DIAGNOSIS — Z23 Encounter for immunization: Secondary | ICD-10-CM

## 2019-03-11 DIAGNOSIS — Z0000 Encounter for general adult medical examination without abnormal findings: Secondary | ICD-10-CM | POA: Diagnosis not present

## 2019-03-11 DIAGNOSIS — L700 Acne vulgaris: Secondary | ICD-10-CM | POA: Diagnosis not present

## 2019-03-11 DIAGNOSIS — D229 Melanocytic nevi, unspecified: Secondary | ICD-10-CM | POA: Diagnosis not present

## 2019-03-11 MED ORDER — PNEUMOCOCCAL 13-VAL CONJ VACC IM SUSP: 0.5000 mL | INTRAMUSCULAR | 0 refills | Status: AC

## 2019-03-11 NOTE — Patient Instructions (Addendum)
  Brandi [name] , Thank you for taking time to come for your Medicare Wellness Visit. I appreciate your ongoing commitment to your health goals. Please review the following plan we discussed and let me know if I can assist you in the future.     This is a list of the screening recommended for you and due dates:  Health Maintenance  Topic Date Due  . HIV Screening  06/09/1968  . Tetanus Vaccine  06/09/1972  . Mammogram  06/10/2003  . Colon Cancer Screening  06/10/2003  . DEXA scan (bone density measurement)  06/10/2018  . Pneumonia vaccines (1 of 2 - PCV13) 06/10/2018  . Pap Smear  03/11/2019*  . Flu Shot  Completed  .  Hepatitis C: One time screening is recommended by Center for Disease Control  (CDC) for  adults born from [birth year] through 1967.   Completed  *Topic was postponed. The date shown is not the original due date.   We will get your records from your previous provider to update.

## 2019-03-11 NOTE — Progress Notes (Signed)
This visit occurred during the SARS-CoV-2 public health emergency.  Safety protocols were in place, including screening questions prior to the visit, additional usage of staff PPE, and extensive cleaning of exam room while observing appropriate contact time as indicated for disinfecting solutions.  Subjective:     Patient ID: Brandi [name] , female    DOB: 23-Oct-1955 , 63 y.o.   MRN: [id number]   Chief Complaint  Patient presents with  . Medicare Wellness    HPI  Pimple under right breast and mole under left breast   She is going to Dr. Tamala Julian for her cardiac issues.    She is taking gabapentin twice a day, she has not had any sciatic pain.      Past Medical History:  Diagnosis Date  . Arthritis   . Hypertension   . Hypertensive crisis   . Osteoporosis   . Rheumatoid arthritis ([place])   . Sleep apnea      Family History  Problem Relation Age of Onset  . Hypertension Mother   . Cancer Mother   . Heart disease Mother   . Kidney disease Father   . Heart disease Father   . Diabetes Father   . Hypertension Sister      Current Outpatient Medications:  .  amLODipine (NORVASC) 10 MG tablet, Take 1 tablet (10 mg total) by mouth daily., Disp: 90 tablet, Rfl: 3 .  atorvastatin (LIPITOR) 40 MG tablet, Take one tablet by mouth once daily, Disp: 90 tablet, Rfl: 3 .  BYSTOLIC 20 MG TABS, Take 1 tablet by mouth once daily, Disp: 90 tablet, Rfl: 3 .  cetirizine (ZYRTEC) 10 MG tablet, Take 10 mg by mouth daily., Disp: , Rfl:  .  Cholecalciferol (VITAMIN D) 50 MCG (2000 UT) CAPS, Take 1 capsule by mouth daily., Disp: , Rfl:  .  cloNIDine (CATAPRES) 0.3 MG tablet, Take 1 tablet (0.3 mg total) by mouth daily. 1/2 tablet at bedtime, Disp: 135 tablet, Rfl: 3 .  cyclobenzaprine (FLEXERIL) 10 MG tablet, 10 mg 3 (three) times daily. , Disp: , Rfl:  .  fluticasone (FLONASE ALLERGY RELIEF) 50 MCG/ACT nasal spray, Flonase Allergy Relief 50 mcg/actuation nasal spray,suspension  INSTILL 2 SPRAYS IN  EACH NOSTRIL QD, Disp: , Rfl:  .  hydrocortisone cream 0.5 %, 1 application as needed. , Disp: , Rfl:  .  Magnesium 500 MG TABS, Take 1 tablet by mouth daily., Disp: , Rfl:  .  topiramate (TOPAMAX) 25 MG tablet, Take 1 tablet (25 mg total) by mouth daily., Disp: 90 tablet, Rfl: 1 .  TRIAMCINOLONE ACETONIDE, TOP, 0.05 % OINT, Apply 1 application topically as needed., Disp: , Rfl:  .  Vitamin E 180 MG CAPS, Take 1 capsule by mouth daily., Disp: , Rfl:  .  gabapentin (NEURONTIN) 300 MG capsule, Take 1 capsule (300 mg total) by mouth daily., Disp: 90 capsule, Rfl: 0 .  rOPINIRole (REQUIP) 1 MG tablet, Take 1 mg by mouth 2 (two) times daily. , Disp: , Rfl:    Allergies  Allergen Reactions  . Hydralazine Itching  . Meperidine Hcl Other (See Comments)  . Aliskiren Hives  . Aspirin Nausea Only  . Latex     hives  . Oxycodone-Acetaminophen Itching  . Rofecoxib Diarrhea  . Spironolactone Other (See Comments)  . Furosemide Other (See Comments)  . Sulfa Antibiotics Rash     Review of Systems  Constitutional: Negative.   Respiratory: Negative.   Cardiovascular: Negative.  Negative for chest pain, palpitations and  leg swelling.  Skin:       She has a "pimple under her right breast and left breast has a mole that has changed"  Neurological: Negative for dizziness and headaches.  Psychiatric/Behavioral: Negative.      Today's Vitals   03/11/19 1129  BP: 132/86  Pulse: 94  Temp: 98.4 F (36.9 C)  TempSrc: Oral  Weight: 232 lb 9.6 oz (105.5 kg)  Height: 5' 3.2" (1.605 m)  PainSc: 0-No pain   Body mass index is 40.94 kg/m.   Objective:  Physical Exam Constitutional:      Appearance: Normal appearance.  Cardiovascular:     Rate and Rhythm: Normal rate and regular rhythm.     Pulses: Normal pulses.     Heart sounds: Normal heart sounds. No murmur.  Pulmonary:     Effort: Pulmonary effort is normal. No respiratory distress.     Breath sounds: Normal breath sounds.  Skin:     Capillary Refill: Capillary refill takes less than 2 seconds.     Findings: Lesion (underneath left breast has a nevi that has changed in size and right breast with comedome present) present.  Neurological:     General: No focal deficit present.     Mental Status: She is alert and oriented to person, place, and time.  Psychiatric:        Mood and Affect: Mood normal.        Behavior: Behavior normal.        Thought Content: Thought content normal.        Judgment: Judgment normal.         Assessment And Plan:     1. Encounter for immunization  Sent to pharmacy - pneumococcal 13-valent conjugate vaccine (PREVNAR 13) SUSP injection; Inject 0.5 mLs into the muscle tomorrow at 10 am for 1 dose.  Dispense: 0.5 mL; Refill: 0  3. Atypical nevi  - Ambulatory referral to Dermatology  4. Comedone  Underneath right breast able to express discharge  5. Encounter for Medicare annual wellness exam  Pt's annual wellness exam was performed and geriatric assessment reviewed.   Pt has no new identiafble wellness concerns at this time.   WIll obtain routine labs.   Will obtain UA and micro.   Behavior modifications discussed and diet history reviewed. Pt will continue to exercise regularly and modify diet, with low GI, plant based foods and decrease food intake of processed foods.   Recommend intake of daily multivitamin, Vitamin D, and calcium.  Recommond mammogram and colonoscopy for preventive screenings, as well as recommend immunizations that include influenza (up to date) and TDAP   [name], FNP    THE PATIENT IS ENCOURAGED TO PRACTICE SOCIAL DISTANCING DUE TO THE COVID-19 PANDEMIC.     Subjective:    Brandi [name] is a 63 y.o. female who presents for a Welcome to Medicare exam.   Review of Systems  Cardiac Risk Factors include: advanced age (>90men, >27 women);hypertension;obesity (BMI >30kg/m2);sedentary lifestyle      Objective:    Today's Vitals   03/11/19  1129  BP: 132/86  Pulse: 94  Temp: 98.4 F (36.9 C)  TempSrc: Oral  Weight: 232 lb 9.6 oz (105.5 kg)  Height: 5' 3.2" (1.605 m)  PainSc: 0-No pain  Body mass index is 40.94 kg/m.  Medications Outpatient Encounter Medications as of 03/11/2019  Medication Sig  . amLODipine (NORVASC) 10 MG tablet Take 1 tablet (10 mg total) by mouth daily.  Marland Kitchen atorvastatin (LIPITOR) 40  MG tablet Take one tablet by mouth once daily  . BYSTOLIC 20 MG TABS Take 1 tablet by mouth once daily  . cetirizine (ZYRTEC) 10 MG tablet Take 10 mg by mouth daily.  . Cholecalciferol (VITAMIN D) 50 MCG (2000 UT) CAPS Take 1 capsule by mouth daily.  . cloNIDine (CATAPRES) 0.3 MG tablet Take 1 tablet (0.3 mg total) by mouth daily. 1/2 tablet at bedtime  . cyclobenzaprine (FLEXERIL) 10 MG tablet 10 mg 3 (three) times daily.   . fluticasone (FLONASE ALLERGY RELIEF) 50 MCG/ACT nasal spray Flonase Allergy Relief 50 mcg/actuation nasal spray,suspension  INSTILL 2 SPRAYS IN EACH NOSTRIL QD  . hydrocortisone cream 0.5 % 1 application as needed.   . Magnesium 500 MG TABS Take 1 tablet by mouth daily.  Marland Kitchen topiramate (TOPAMAX) 25 MG tablet Take 1 tablet (25 mg total) by mouth daily.  . TRIAMCINOLONE ACETONIDE, TOP, 0.05 % OINT Apply 1 application topically as needed.  . Vitamin E 180 MG CAPS Take 1 capsule by mouth daily.  . [DISCONTINUED] gabapentin (NEURONTIN) 300 MG capsule Take 300 mg by mouth daily.   . [EXPIRED] pneumococcal 13-valent conjugate vaccine (PREVNAR 13) SUSP injection Inject 0.5 mLs into the muscle tomorrow at 10 am for 1 dose.  Marland Kitchen rOPINIRole (REQUIP) 1 MG tablet Take 1 mg by mouth 2 (two) times daily.    No facility-administered encounter medications on file as of 03/11/2019.     History: Past Medical History:  Diagnosis Date  . Arthritis   . Hypertension   . Hypertensive crisis   . Osteoporosis   . Rheumatoid arthritis ([employer])   . Sleep apnea    Past Surgical History:  Procedure Laterality Date  .  ABDOMINAL HYSTERECTOMY     PARTIAL  . CHOLECYSTECTOMY    . PROTESTIC HIPS    . PROTESTIC KNEE      Family History  Problem Relation Age of Onset  . Hypertension Mother   . Cancer Mother   . Heart disease Mother   . Kidney disease Father   . Heart disease Father   . Diabetes Father   . Hypertension Sister    Social History   Occupational History  . Not on file  Tobacco Use  . Smoking status: Never Smoker  . Smokeless tobacco: Never Used  Substance and Sexual Activity  . Alcohol use: Not Currently  . Drug use: Not Currently  . Sexual activity: Not on file    Tobacco Counseling Counseling given: Not Answered   Immunizations and Health Maintenance Immunization History  Administered Date(s) Administered  . Influenza, High Dose Seasonal PF 01/06/2019   Health Maintenance Due  Topic Date Due  . HIV Screening  06/09/1968  . TETANUS/TDAP  06/09/1972  . PAP SMEAR-Modifier  06/10/1974  . MAMMOGRAM  06/10/2003  . COLONOSCOPY  06/10/2003  . DEXA SCAN  06/10/2018  . PNA vac Low Risk Adult (1 of 2 - PCV13) 06/10/2018    Activities of Daily Living In your present state of health, do you have any difficulty performing the following activities: 03/11/2019  Hearing? N  Vision? Y  Difficulty concentrating or making decisions? N  Walking or climbing stairs? Y  Dressing or bathing? N  Doing errands, shopping? Y  Comment she goes with her daughter  Conservation officer, nature and eating ? N  Using the Toilet? N  In the past six months, have you accidently leaked urine? N  Do you have problems with loss of bowel control? N  Managing your  Medications? N  Managing your Finances? N  Housekeeping or managing your Housekeeping? N    Physical Exam  (optional), or other factors deemed appropriate based on the beneficiary's medical and social history and current clinical standards.  Advanced Directives: Does Patient Have a Medical Advance Directive?: No Would patient like information on  creating a medical advance directive?: Yes (MAU/Ambulatory/Procedural Areas - Information given)    Assessment:    This is a routine wellness examination for this patient .   Vision/Hearing screen  Hearing Screening   125Hz  250Hz  500Hz  1000Hz  2000Hz  3000Hz  4000Hz  6000Hz  8000Hz   Right ear:           Left ear:             Visual Acuity Screening   Right eye Left eye Both eyes  Without correction:     With correction: 20/30-1 20/30 20/20    Dietary issues and exercise activities discussed:  Current Exercise Habits: The patient does not participate in regular exercise at present, Exercise limited by: orthopedic condition(s)  Goals    . "I am concerned about having a SE to my BP meds" (pt-stated)     Current Barriers:  Marland Kitchen Knowledge Deficits related to treatment management for HTN  Nurse Case Manager Clinical Goal(s):  Marland Kitchen Over the next 30 days, patient will work with the CCM team to address needs related to medication management for HTN . Over the next 90 days, patient will report having adequate BP control 130/80 or better, w/o noted SE to the prescribed medications for this condition  CCM RN CM Interventions:  01/09/19 call completed with patient  . Evaluation of current treatment plan related to Hypertension and patient's adherence to plan as established by provider. . Provided education to patient re: importance of keeping BP well controlled to help avoid cardiovascular and or cardiopulmonary complications/discussed patient will consider monitory her BP at home and recording readings; discussed target BP of 130/80; patient instructed PCP provider and or the CCM team of abnormal readings . Reviewed medications with patient and discussed indication, dosage and frequency of currently prescribed antihypertensive therapies; discussed patient concerns her BP meds may be increasing the edema to her lower extremities; patient instructed to avoid table salt and stay well hydrated; discussed  patient would like to seek counsel from embedded Pharm D regarding current pharmacological treatment plan  . Collaborated with embedded Pharm D [name] via in basket message regarding outreach to patient to discussed currently treatment plan for HTN and voiced patient's concerns of potential SE to medications . Discussed plans with patient for ongoing care management follow up and provided patient with direct contact information for care management team . Advised patient, providing education and rationale, to monitor blood pressure daily and record, calling the CCM team and or PCP for findings outside established parameters.   Patient Self Care Activities:  . Self administers medications as prescribed . Attends all scheduled provider appointments . Calls pharmacy for medication refills . Attends church or other social activities . Performs ADL's independently . Performs IADL's independently . Calls provider office for new concerns or questions   Initial goal documentation     . "I fell yesterday and am really sore" (pt-stated)     Current Barriers:  . Painful due to recent fall at daughters house  Clinical Social Work Clinical Goal(s):  Marland Kitchen Over the next 10 days the patient will follow up with her provider as directed by SW if pain worsens or persists . Over  the next 45 days the patient will work with CM team to identify fall hazards and develop a plan for future fall intervention strategies . Over the next 90 days the patient will not experience a fall as a result of following established plan of care   CCM SW Interventions: Completed 01/09/2019 . Patient interviewed and appropriate assessments performed . Determined the patient experienced a fall while at her daughters house and trying to reach for a shoe "it just happened so fast" . Discussed fall intervention strategies to prevent future falls- identified the patient uses a reacher to pick items up off the floor but did not have  one at the time of recent fall . Assessed for injury due to fall - the patient reports soreness to right side, specifically right should . Determined the patient is administering two extra strength Tylenol as needed to alleviate pain . Advised the patient to contact her primary providers office for worsening pain or continued pain over the next 5 days . Collaboration with RN Case Manager to update on patient reported fall . Collaboration with primary provider, [name] FNP regarding recent fall and current plan  CCM RN CM Interventions:  01/09/19: Call completed with patient  . Received an update from embedded BSW Daneen Schick stating Ms. Amparan had a fall while in her daughter's home . Evaluation of current treatment plan related to recent Fall and patient's adherence to plan as established by provider. . Advised patient to notify the CCM team and or PCP provider promptly to report any/all falls  . Provided education to patient re: the importance of wearing skid proof socks, and or good supportive shoes with ambulation, discussed importance of avoidance of walking on uneven ground; discussed importance of patient using a cane or walker if needed to help with balance and or support to help avoid falls; discussed patient landed on her Rt shoulder but denies injury, she did not go to the ED for evaluation, she is able to use her shoulder w/o difficulty but reports having some soreness; discussed she  is currently receiving in home PT for therapy on her Lt shoulder, she will ask the PT to evaluate her right PT at next visit and make any recommendations related to PT for this extremity  . Discussed plans with patient for ongoing care management follow up and provided patient with direct contact information for care management team  Patient Self Care Activities:  . Self administers medications as prescribed . Attends all scheduled provider appointments . Calls provider office for new concerns or  questions  Initial goal documentation     . "I know I can learn to eat a little better" (pt-stated)     Current Barriers:  Marland Kitchen Knowledge Deficits related to disease process and treatment management of Diabetes  Nurse Case Manager Clinical Goal(s):  Marland Kitchen Over the next 90 days, patient will work with the CCM team to address needs related to improved diabetes disease management   CCM RN CM Interventions:  01/09/19 call completed with patient   . Evaluation of current treatment plan related to Diabetes and patient's adherence to plan as established by provider. . Provided education to patient re: target A1C range of less than 5.7% for optimal management and to return to normal range; discussed current A1C of 6.2% obtained on 01/06/19; discussed Self Care interventions to lower A1C including implementing daily exercise regimen of 150 minutes weekly; eating a low carb, low sugar diet with use of Meal planning and portion  control; discussed importance of maintaining weight within normal BMI and taking prescribed medications as directed w/o missed doses . Reviewed medications with patient and discussed current pharmacological treatment for DM including indication, dosage and frequency of meds . Discussed plans with patient for ongoing care management follow up and provided patient with direct contact information for care management team . Provided patient with printed educational materials related to Diabetes Management, Diabetes Meal Planning, s/s of Hypo/Hyperglycemia, Carb Counting and Carb Choices to be mailed to patient's home . Advised patient, providing education and rationale, to check cbg daily before meals and record, calling the CCM team and or PCP for findings outside established parameters.    Patient Self Care Activities:  . Self administers medications as prescribed . Attends all scheduled provider appointments . Calls pharmacy for medication refills . Attends church or other social  activities . Performs ADL's independently . Performs IADL's independently . Calls provider office for new concerns or questions  Initial goal documentation     . "I need to learn more about my Medicare benefits" (pt-stated)     Current Barriers:  . Lacks knowledge of how to access benefit information including dental coverage benefits . Unclear how the patients Medicare Plan interacts with her BCBS plan . Newly enrolled in Medicare coverage as of this year   Clinical Social Work Clinical Goal(s):  Marland Kitchen Over the next 45 days, patient will work with a Medical sales representative to address questions related to which Medicare plans will assist with needed dental work.  Interventions: Completed 12/06/2018 . Outbound call to the patient to confirm receipt of mailed resource . Determined the patient has yet to receive information . Mailed the patient information on [employer] counselors . Advised the patient to expect a return call over the next three weeks for review of resource  Patient Self Care Activities:  . Self administers medications as prescribed . Attends all scheduled provider appointments  Please see past updates related to this goal by clicking on the "Past Updates" button in the selected goal      . Assist with Chronic Care Management and Care Coordination needs     Current Barriers:  Marland Kitchen Knowledge Barriers related to resources and support available to address needs related to Chronic Care Management and Care Coordination   Case Manager Clinical Goal(s):  Marland Kitchen Over the next 30 days, patient will work with the CCM team to address needs related to Chronic Care Management and assistance with Polypharmacy needs  Interventions:  . Collaborated with BSW and initiated plan of care to address needs related to Chronic Care Management and Medication management needs  Patient Self Care Activities:  . Attends all scheduled provider appointments . Calls pharmacy for medication refills . Calls provider  office for new concerns or questions  Initial goal documentation     . I would like to get better control of my blood pressure so I can feel better (pt-stated)     Current Barriers:  . Uncontrolled hypertension, complicated by obesity, hyperlipidemia . Current antihypertensive regimen: amlodipine 10mg , bystolic, clonidine  . Previous antihypertensives tried: hydralazine, spironolactone . Current allergy/intolerance list:  o spironolactone, furosemide, and hydrochlorothiazide o Sulfa allergy  . Current home BP readings: 130-140s/80-100s . Patient complaining of edema.  Likely causes amlodipine vs venous statsis vs cardiac . Patient does not like how she feels on her current medications.  Unfortunately, there are limited options when it comes to her anti-hypertensive regimen.  Pharmacist Clinical Goal(s):  Marland Kitchen Over the next  90 days, patient will work with PharmD and providers to optimize antihypertensive regimen  Interventions: . Comprehensive medication review performed; medication list updated in the electronic medical record.  o Patient does not like that she is on so many medications, however she states she needs them all.  Noted that patient could potentially reduce her cyclobenzaprine dose to 5mg  since she is complaining of drowsiness.  She is not willing to decrease at this time.  Will revisit . Counseled on the importance of a low salt diet; exercising as able to improve blood pressure . Patient to see cardiology next week.  I will follow up with her in 2-3 weeks regarding any changes.   Patient Self Care Activities:  . Patient will continue to check BP daily , document, and provide at future appointments . Patient will focus on medication adherence by continuing to take medications as prescribed.  Initial goal documentation     . Increase physical activity     "increase physical activity"      . Weight (lb) < 200 lb (90.7 kg)     "I would like to drop more weight"        Depression Screen PHQ 2/9 Scores 03/11/2019 01/06/2019 11/04/2018 09/30/2018  PHQ - 2 Score 0 0 0 0     Fall Risk Fall Risk  03/11/2019  Falls in the past year? 1  Number falls in past yr: 0  Injury with Fall? 0    Cognitive Function: MMSE - Mini Mental State Exam 03/11/2019 09/26/2018  Orientation to time 5 5  Orientation to Place 5 4  Registration 3 3  Attention/ Calculation 5 5  Recall 2 2  Language- name 2 objects 2 2  Language- repeat 1 1  Language- follow 3 step command 3 3  Language- read & follow direction 1 1  Write a sentence 1 1  Copy design 1 1  Total score 29 28     6CIT Screen 03/11/2019  What Year? 0 points  What month? 0 points  What time? 0 points  Count back from 20 0 points  Months in reverse 0 points  Repeat phrase 6 points  Total Score 6    Patient Care Team: [name], Arrey as PCP - General ([place]) Daneen Schick as Social Worker [place], Claudette Stapler, RN as Case Manager Lavera Guise, [facility], [place] (Pharmacist)     Plan:   See above   I have personally reviewed and noted the following in the patient's chart:   . Medical and social history . Use of alcohol, tobacco or illicit drugs  . Current medications and supplements . Functional ability and status . Nutritional status . Physical activity . Advanced directives . List of other physicians . Hospitalizations, surgeries, and ER visits in previous 12 months . Vitals . Screenings to include cognitive, depression, and falls . Referrals and appointments  In addition, I have reviewed and discussed with patient certain preventive protocols, quality metrics, and best practice recommendations. A written personalized care plan for preventive services as well as general preventive health recommendations were provided to patient.     [name], FNP 03/25/2019

## 2019-03-12 ENCOUNTER — Other Ambulatory Visit: Payer: Self-pay

## 2019-03-14 ENCOUNTER — Telehealth: Payer: Self-pay

## 2019-03-24 ENCOUNTER — Other Ambulatory Visit: Payer: Self-pay

## 2019-03-24 ENCOUNTER — Telehealth: Payer: Medicare Other | Admitting: Pharmacist

## 2019-03-24 MED ORDER — GABAPENTIN 300 MG PO CAPS: 300.0000 mg | ORAL_CAPSULE | Freq: Every day | ORAL | 0 refills | Status: DC

## 2019-04-02 ENCOUNTER — Ambulatory Visit (INDEPENDENT_AMBULATORY_CARE_PROVIDER_SITE_OTHER): Payer: Medicare Other

## 2019-04-02 ENCOUNTER — Telehealth: Payer: Medicare Other

## 2019-04-02 ENCOUNTER — Other Ambulatory Visit: Payer: Self-pay

## 2019-04-02 DIAGNOSIS — G4730 Sleep apnea, unspecified: Secondary | ICD-10-CM

## 2019-04-02 DIAGNOSIS — I10 Essential (primary) hypertension: Secondary | ICD-10-CM

## 2019-04-02 DIAGNOSIS — R7303 Prediabetes: Secondary | ICD-10-CM

## 2019-04-04 ENCOUNTER — Ambulatory Visit: Payer: Medicare Other

## 2019-04-04 DIAGNOSIS — I10 Essential (primary) hypertension: Secondary | ICD-10-CM

## 2019-04-04 DIAGNOSIS — R7303 Prediabetes: Secondary | ICD-10-CM

## 2019-04-04 NOTE — Patient Instructions (Signed)
Social Worker Visit Information  Goals we discussed today:  Goals Addressed            This Visit's Progress   . Assist with care coordination and referral to dermatology       Current Barriers:  . Not established with dermatologist in the area  Social Work Clinical Goal(s):  Marland Kitchen Over the next 30 days the patient will receive dermatology referral from her primary provider as desired by the patient  Interventions: . Outbound call to the patient to assist with care coordination . Discussed patients desire to seek dermatology care to remove a mole along her bra line o Patient concerned without removal it may become inflamed due to location . Collaboration with [name] FNP to request referral . Communication to RN Case Manager to provide an update on today's care coordination  Patient Self Care Activities:  . Patient verbalizes understanding of plan to follow up with dermatologist once referral is placed . Self administers medications as prescribed . Attends all scheduled provider appointments . Calls pharmacy for medication refills  Initial goal documentation          Follow Up Plan: SW will follow up with the patient over the next 3 weeks to assess outcome of referral   [place], CDP Social Worker, Certified Dementia Practitioner [place] [facility] [phone]

## 2019-04-04 NOTE — Chronic Care Management (AMB) (Signed)
  Chronic Care Management    Social Work Follow Up Note  04/04/2019 Name: Brandi [name] MRN: [id number] DOB: [birth year]  Brandi [name] is a 64 y.o. year old female who is a primary care patient of [name], [place]. The CCM team was consulted for assistance with care coordination.   Review of patient status, including review of consultants reports, other relevant assessments, and collaboration with appropriate care team members and the patient's provider was performed as part of comprehensive patient evaluation and provision of chronic care management services.    SW placed an outbound call to the patient to assist with care coordination.  Outpatient Encounter Medications as of 04/04/2019  Medication Sig  . amLODipine (NORVASC) 10 MG tablet Take 1 tablet (10 mg total) by mouth daily.  Marland Kitchen atorvastatin (LIPITOR) 40 MG tablet Take one tablet by mouth once daily  . BYSTOLIC 20 MG TABS Take 1 tablet by mouth once daily  . cetirizine (ZYRTEC) 10 MG tablet Take 10 mg by mouth daily.  . Cholecalciferol (VITAMIN D) 50 MCG (2000 UT) CAPS Take 1 capsule by mouth daily.  . cloNIDine (CATAPRES) 0.3 MG tablet Take 1 tablet (0.3 mg total) by mouth daily. 1/2 tablet at bedtime  . cyclobenzaprine (FLEXERIL) 10 MG tablet 10 mg 3 (three) times daily.   . fluticasone (FLONASE ALLERGY RELIEF) 50 MCG/ACT nasal spray Flonase Allergy Relief 50 mcg/actuation nasal spray,suspension  INSTILL 2 SPRAYS IN EACH NOSTRIL QD  . gabapentin (NEURONTIN) 300 MG capsule Take 1 capsule (300 mg total) by mouth daily.  . hydrocortisone cream 0.5 % 1 application as needed.   . Magnesium 500 MG TABS Take 1 tablet by mouth daily.  Marland Kitchen rOPINIRole (REQUIP) 1 MG tablet Take 1 mg by mouth 2 (two) times daily.   Marland Kitchen topiramate (TOPAMAX) 25 MG tablet Take 1 tablet (25 mg total) by mouth daily.  . TRIAMCINOLONE ACETONIDE, TOP, 0.05 % OINT Apply 1 application topically as needed.  . Vitamin E 180 MG CAPS Take 1 capsule by mouth daily.   No  facility-administered encounter medications on file as of 04/04/2019.     Goals Addressed            This Visit's Progress   . Assist with care coordination and referral to dermatology       Current Barriers:  . Not established with dermatologist in the area  Social Work Clinical Goal(s):  Marland Kitchen Over the next 30 days the patient will receive dermatology referral from her primary provider as desired by the patient  Interventions: . Outbound call to the patient to assist with care coordination . Discussed patients desire to seek dermatology care to remove a mole along her bra line o Patient concerned without removal it may become inflamed due to location . Collaboration with [name] FNP to request referral . Communication to RN Case Manager to provide an update on today's care coordination  Patient Self Care Activities:  . Patient verbalizes understanding of plan to follow up with dermatologist once referral is placed . Self administers medications as prescribed . Attends all scheduled provider appointments . Calls pharmacy for medication refills  Initial goal documentation         Follow Up Plan: SW will follow up with the patient over the next 3 weeks to assess progression of patient goal.   Daneen Schick, BSW, CDP Social Worker, Certified Dementia Practitioner [place] [facility] [place] [facility] [phone]

## 2019-04-04 NOTE — Chronic Care Management (AMB) (Signed)
Chronic Care Management   Follow Up Note   04/02/2019 Name: Brandi [name] MRN: [id number] DOB: [birth year]  Referred by: [name], FNP Reason for referral : Chronic Care Management (CCM RNCM Telephone Follow up )   Brandi [name] is a 64 y.o. year old female who is a primary care patient of [name], [place]. The CCM team was consulted for assistance with chronic disease management and care coordination needs.    Review of patient status, including review of consultants reports, relevant laboratory and other test results, and collaboration with appropriate care team members and the patient's provider was performed as part of comprehensive patient evaluation and provision of chronic care management services.    SDOH (Social Determinants of Health) screening performed today: None. See Care Plan for related entries.   Placed outbound call to Brandi [name] for a CCM RN CM update.   Outpatient Encounter Medications as of 04/02/2019  Medication Sig  . amLODipine (NORVASC) 10 MG tablet Take 1 tablet (10 mg total) by mouth daily.  Marland Kitchen atorvastatin (LIPITOR) 40 MG tablet Take one tablet by mouth once daily  . BYSTOLIC 20 MG TABS Take 1 tablet by mouth once daily  . cetirizine (ZYRTEC) 10 MG tablet Take 10 mg by mouth daily.  . Cholecalciferol (VITAMIN D) 50 MCG (2000 UT) CAPS Take 1 capsule by mouth daily.  . cloNIDine (CATAPRES) 0.3 MG tablet Take 1 tablet (0.3 mg total) by mouth daily. 1/2 tablet at bedtime  . cyclobenzaprine (FLEXERIL) 10 MG tablet 10 mg 3 (three) times daily.   . fluticasone (FLONASE ALLERGY RELIEF) 50 MCG/ACT nasal spray Flonase Allergy Relief 50 mcg/actuation nasal spray,suspension  INSTILL 2 SPRAYS IN EACH NOSTRIL QD  . gabapentin (NEURONTIN) 300 MG capsule Take 1 capsule (300 mg total) by mouth daily.  . hydrocortisone cream 0.5 % 1 application as needed.   . Magnesium 500 MG TABS Take 1 tablet by mouth daily.  Marland Kitchen rOPINIRole (REQUIP) 1 MG tablet Take 1 mg by mouth 2 (two)  times daily.   Marland Kitchen topiramate (TOPAMAX) 25 MG tablet Take 1 tablet (25 mg total) by mouth daily.  . TRIAMCINOLONE ACETONIDE, TOP, 0.05 % OINT Apply 1 application topically as needed.  . Vitamin E 180 MG CAPS Take 1 capsule by mouth daily.   No facility-administered encounter medications on file as of 04/02/2019.     Goals Addressed      Patient Stated   . "I am concerned about having a SE to my BP meds" (pt-stated)       Current Barriers:  Marland Kitchen Knowledge Deficits related to treatment management for HTN . Chronic Disease Management support and education needs related to DMII, HTN, Sleep Apnea  Nurse Case Manager Clinical Goal(s):  Marland Kitchen Over the next 30 days, patient will work with the CCM team to address needs related to medication management for HTN  Goal Met . Over the next 90 days, patient will report having adequate BP control 130/80 or better, w/o noted SE to the prescribed medications for this condition  CCM RN CM Interventions:  04/02/19 call completed with patient . Determined patient is Self monitoring her BP weekly and recording her readings; reports BP are at goal 130/70 and below  . Evaluation of current treatment plan related to Hypertension and patient's adherence to plan as established by provider. . Reviewed medications with patient and discussed patient is adhering to her prescribed medication regimen without noted SE at this time . Discussed plans with patient for ongoing care  management follow up and provided patient with direct contact information for care management team . Advised patient, providing education and rationale, to monitor blood pressure daily and record, calling the CCM team and or PCP for findings outside established parameters.  . Provided patient with printed educational materials related to What is High Blood Pressure?; African Americans and High Blood Pressure; Why Should I restrict Sodium?; Life's Simple 7; BP log  Patient Self Care Activities:  . Self  administers medications as prescribed . Attends all scheduled provider appointments . Calls pharmacy for medication refills . Attends church or other social activities . Performs ADL's independently . Performs IADL's independently . Calls provider office for new concerns or questions  Please see past updates related to this goal by clicking on the "Past Updates" button in the selected goal      . "I fell yesterday and am really sore" (pt-stated)       Current Barriers:  Marland Kitchen Knowledge Deficits related to treatment management for Impaired Balance/gait and Shoulder pain  . Chronic Disease Management support and education needs related to DMII, HTN, Sleep Apnea  Clinical Social Work Clinical Goal(s):  Marland Kitchen Over the next 10 days the patient will follow up with her provider as directed by SW if pain worsens or persists Goal Met . Over the next 45 days the patient will work with CM team to identify fall hazards and develop a plan for future fall intervention strategies Goal Met . Over the next 90 days the patient will not experience a fall as a result of following established plan of care   CCM RN CM Interventions:  04/02/19: Call completed with patient . Determined Brandi [name] is doing well and is thriving with PT for shoulder rehabilitation . Patient voices no fall concerns or home safety concerns at this time . Reiterated importance of keeping the CCM team and PCP of any/all falls and or home safety concerns that may arise . Discussed plans with patient for ongoing care management follow up and provided patient with direct contact information for care management team  Patient Self Care Activities:  . Self administers medications as prescribed . Attends all scheduled provider appointments . Calls provider office for new concerns or questions  Please see past updates related to this goal by clicking on the "Past Updates" button in the selected goal      . "I know I can learn to eat a Rayen Dafoe  better" (pt-stated)       Current Barriers:  Marland Kitchen Knowledge Deficits related to disease process and treatment management of Diabetes . Chronic Disease Management support and education needs related to DMII, HTN, Sleep Apnea  Nurse Case Manager Clinical Goal(s):  Marland Kitchen Over the next 90 days, patient will work with the CCM team to address needs related to improved diabetes disease management   CCM RN CM Interventions:  04/02/19 call completed with patient   . Evaluation of current treatment plan related to Diabetes and patient's adherence to plan as established by provider. . Provided education to patient re: daily glycemic goal is to achieve glucose levels between 80-130; reiterated target A1C range of less than 5.7% for optimal management and to return to normal range; discussed current A1C of 6.2% obtained on 01/06/19; Reinforced Self Care interventions to lower A1C including implementing daily exercise regimen of 150 minutes weekly; eating a low carb, low sugar diet with use of Meal planning and portion control; discussed importance of maintaining weight within normal BMI and taking prescribed  medications as directed w/o missed doses . Reviewed medications with patient and discussed current pharmacological treatment for DM including indication, dosage and frequency of meds . Discussed plans with patient for ongoing care management follow up and provided patient with direct contact information for care management team . Provided patient with printed educational materials related to Blood Sugar Log . Advised patient, providing education and rationale, to check cbg daily before meals and record, calling the CCM team and or PCP for findings outside established parameters.    Patient Self Care Activities:  . Self administers medications as prescribed . Attends all scheduled provider appointments . Calls pharmacy for medication refills . Attends church or other social activities . Performs ADL's  independently . Performs IADL's independently . Calls provider office for new concerns or questions  Please see past updates related to this goal by clicking on the "Past Updates" button in the selected goal      . COMPLETED: "I need to learn more about my Medicare benefits" (pt-stated)       Current Barriers:  . Lacks knowledge of how to access benefit information including dental coverage benefits . Unclear how the patients Medicare Plan interacts with her BCBS plan . Newly enrolled in Medicare coverage as of this year   Clinical Social Work Clinical Goal(s):  Marland Kitchen Over the next 45 days, patient will work with a Medical sales representative to address questions related to which Medicare plans will assist with needed dental work.  Interventions: 04/02/19 completed call with patient  . Determined patient spoke with the embedded BSW Daneen Schick to have questions answered concerning her Medicare plan  . Discussed she has no further questions related to her benefits and is more familiar since receiving assistance from [facility]   Patient Self Care Activities:  . Self administers medications as prescribed . Attends all scheduled provider appointments  Please see past updates related to this goal by clicking on the "Past Updates" button in the selected goal        Other   . COMPLETED: Assist with Chronic Care Management and Care Coordination needs       Current Barriers:  Marland Kitchen Knowledge Barriers related to resources and support available to address needs related to Chronic Care Management and Care Coordination   Case Manager Clinical Goal(s):  Marland Kitchen Over the next 30 days, patient will work with the CCM team to address needs related to Chronic Care Management and assistance with Polypharmacy needs  Interventions:  . Collaborated with BSW and initiated plan of care to address needs related to Chronic Care Management and Medication management needs  Patient Self Care Activities:  . Attends all scheduled  provider appointments . Calls pharmacy for medication refills . Calls provider office for new concerns or questions  Initial goal documentation       Telephone follow up appointment with care management team member scheduled for: 05/14/19  Barb Merino, RN, BSN, CCM Care Management Coordinator [place] [facility]  Direct Phone: [phone]

## 2019-04-04 NOTE — Patient Instructions (Addendum)
Visit Information  Goals Addressed      Patient Stated   . "I am concerned about having a SE to my BP meds" (pt-stated)       Current Barriers:  Marland Kitchen Knowledge Deficits related to treatment management for HTN . Chronic Disease Management support and education needs related to DMII, HTN, Sleep Apnea  Nurse Case Manager Clinical Goal(s):  Marland Kitchen Over the next 30 days, patient will work with the CCM team to address needs related to medication management for HTN  Goal Met . Over the next 90 days, patient will report having adequate BP control 130/80 or better, w/o noted SE to the prescribed medications for this condition  CCM RN CM Interventions:  04/02/19 call completed with patient . Determined patient is Self monitoring her BP weekly and recording her readings; reports BP are at goal 130/70 and below  . Evaluation of current treatment plan related to Hypertension and patient's adherence to plan as established by provider. . Reviewed medications with patient and discussed patient is adhering to her prescribed medication regimen without noted SE at this time . Discussed plans with patient for ongoing care management follow up and provided patient with direct contact information for care management team . Advised patient, providing education and rationale, to monitor blood pressure daily and record, calling the CCM team and or PCP for findings outside established parameters.  . Provided patient with printed educational materials related to What is High Blood Pressure?; African Americans and High Blood Pressure; Why Should I restrict Sodium?; Life's Simple 7; BP log  Patient Self Care Activities:  . Self administers medications as prescribed . Attends all scheduled provider appointments . Calls pharmacy for medication refills . Attends church or other social activities . Performs ADL's independently . Performs IADL's independently . Calls provider office for new concerns or questions  Please see  past updates related to this goal by clicking on the "Past Updates" button in the selected goal      . "I fell yesterday and am really sore" (pt-stated)       Current Barriers:  Marland Kitchen Knowledge Deficits related to treatment management for Impaired Balance/gait and Shoulder pain  . Chronic Disease Management support and education needs related to DMII, HTN, Sleep Apnea  Clinical Social Work Clinical Goal(s):  Marland Kitchen Over the next 10 days the patient will follow up with her provider as directed by SW if pain worsens or persists Goal Met . Over the next 45 days the patient will work with CM team to identify fall hazards and develop a plan for future fall intervention strategies Goal Met . Over the next 90 days the patient will not experience a fall as a result of following established plan of care   CCM RN CM Interventions:  04/02/19: Call completed with patient . Determined Ms. Boule is doing well and is thriving with PT for shoulder rehabilitation . Patient voices no fall concerns or home safety concerns at this time . Reiterated importance of keeping the CCM team and PCP of any/all falls and or home safety concerns that may arise . Discussed plans with patient for ongoing care management follow up and provided patient with direct contact information for care management team  Patient Self Care Activities:  . Self administers medications as prescribed . Attends all scheduled provider appointments . Calls provider office for new concerns or questions  Please see past updates related to this goal by clicking on the "Past Updates" button in the selected goal      . "  I know I can learn to eat a Simmie Camerer better" (pt-stated)       Current Barriers:  Marland Kitchen Knowledge Deficits related to disease process and treatment management of Diabetes . Chronic Disease Management support and education needs related to DMII, HTN, Sleep Apnea  Nurse Case Manager Clinical Goal(s):  Marland Kitchen Over the next 90 days, patient will  work with the CCM team to address needs related to improved diabetes disease management   CCM RN CM Interventions:  04/02/19 call completed with patient   . Evaluation of current treatment plan related to Diabetes and patient's adherence to plan as established by provider. . Provided education to patient re: daily glycemic goal is to achieve glucose levels between 80-130; reiterated target A1C range of less than 5.7% for optimal management and to return to normal range; discussed current A1C of 6.2% obtained on 01/06/19; Reinforced Self Care interventions to lower A1C including implementing daily exercise regimen of 150 minutes weekly; eating a low carb, low sugar diet with use of Meal planning and portion control; discussed importance of maintaining weight within normal BMI and taking prescribed medications as directed w/o missed doses . Reviewed medications with patient and discussed current pharmacological treatment for DM including indication, dosage and frequency of meds . Discussed plans with patient for ongoing care management follow up and provided patient with direct contact information for care management team . Provided patient with printed educational materials related to Blood Sugar Log . Advised patient, providing education and rationale, to check cbg daily before meals and record, calling the CCM team and or PCP for findings outside established parameters.    Patient Self Care Activities:  . Self administers medications as prescribed . Attends all scheduled provider appointments . Calls pharmacy for medication refills . Attends church or other social activities . Performs ADL's independently . Performs IADL's independently . Calls provider office for new concerns or questions  Please see past updates related to this goal by clicking on the "Past Updates" button in the selected goal      . COMPLETED: "I need to learn more about my Medicare benefits" (pt-stated)       Current  Barriers:  . Lacks knowledge of how to access benefit information including dental coverage benefits . Unclear how the patients Medicare Plan interacts with her BCBS plan . Newly enrolled in Medicare coverage as of this year   Clinical Social Work Clinical Goal(s):  Marland Kitchen Over the next 45 days, patient will work with a Medical sales representative to address questions related to which Medicare plans will assist with needed dental work.  Interventions: 04/02/19 completed call with patient  . Determined patient spoke with the embedded BSW Daneen Schick to have questions answered concerning her Medicare plan  . Discussed she has no further questions related to her benefits and is more familiar since receiving assistance from [facility]   Patient Self Care Activities:  . Self administers medications as prescribed . Attends all scheduled provider appointments  Please see past updates related to this goal by clicking on the "Past Updates" button in the selected goal        Other   . COMPLETED: Assist with Chronic Care Management and Care Coordination needs       Current Barriers:  Marland Kitchen Knowledge Barriers related to resources and support available to address needs related to Chronic Care Management and Care Coordination   Case Manager Clinical Goal(s):  Marland Kitchen Over the next 30 days, patient will work with the CCM team  to address needs related to Chronic Care Management and assistance with Polypharmacy needs  Interventions:  . Collaborated with BSW and initiated plan of care to address needs related to Chronic Care Management and Medication management needs  Patient Self Care Activities:  . Attends all scheduled provider appointments . Calls pharmacy for medication refills . Calls provider office for new concerns or questions  Initial goal documentation      The patient verbalized understanding of instructions provided today and declined a print copy of patient instruction materials.   Telephone follow up  appointment with care management team member scheduled for: 05/14/19  Barb Merino, RN, BSN, CCM Care Management Coordinator [place] [facility]  Direct Phone: [phone]

## 2019-04-09 ENCOUNTER — Ambulatory Visit: Payer: Medicare Other

## 2019-04-09 DIAGNOSIS — I10 Essential (primary) hypertension: Secondary | ICD-10-CM

## 2019-04-09 DIAGNOSIS — D229 Melanocytic nevi, unspecified: Secondary | ICD-10-CM

## 2019-04-09 NOTE — Chronic Care Management (AMB) (Signed)
Chronic Care Management    Social Work Follow Up Note  04/09/2019 Name: Brandi [name] MRN: [id number] DOB: 10-08-1955  Brandi [name] is a 64 y.o. year old female who is a primary care patient of Brandi [name], [place]. The CCM team was consulted for assistance with care coordination.   Review of patient status, including review of consultants reports, other relevant assessments, and collaboration with appropriate care team members and the patient's provider was performed as part of comprehensive patient evaluation and provision of chronic care management services.    SW assisted with care coordination regarding dermatology referral.  Outpatient Encounter Medications as of 04/09/2019  Medication Sig  . amLODipine (NORVASC) 10 MG tablet Take 1 tablet (10 mg total) by mouth daily.  Marland Kitchen atorvastatin (LIPITOR) 40 MG tablet Take one tablet by mouth once daily  . BYSTOLIC 20 MG TABS Take 1 tablet by mouth once daily  . cetirizine (ZYRTEC) 10 MG tablet Take 10 mg by mouth daily.  . Cholecalciferol (VITAMIN D) 50 MCG (2000 UT) CAPS Take 1 capsule by mouth daily.  . cloNIDine (CATAPRES) 0.3 MG tablet Take 1 tablet (0.3 mg total) by mouth daily. 1/2 tablet at bedtime  . cyclobenzaprine (FLEXERIL) 10 MG tablet 10 mg 3 (three) times daily.   . fluticasone (FLONASE ALLERGY RELIEF) 50 MCG/ACT nasal spray Flonase Allergy Relief 50 mcg/actuation nasal spray,suspension  INSTILL 2 SPRAYS IN EACH NOSTRIL QD  . gabapentin (NEURONTIN) 300 MG capsule Take 1 capsule (300 mg total) by mouth daily.  . hydrocortisone cream 0.5 % 1 application as needed.   . Magnesium 500 MG TABS Take 1 tablet by mouth daily.  Marland Kitchen rOPINIRole (REQUIP) 1 MG tablet Take 1 mg by mouth 2 (two) times daily.   Marland Kitchen topiramate (TOPAMAX) 25 MG tablet Take 1 tablet (25 mg total) by mouth daily.  . TRIAMCINOLONE ACETONIDE, TOP, 0.05 % OINT Apply 1 application topically as needed.  . Vitamin E 180 MG CAPS Take 1 capsule by mouth daily.   No  facility-administered encounter medications on file as of 04/09/2019.     Goals Addressed            This Visit's Progress   . Assist with care coordination and referral to dermatology   On track    Current Barriers:  . Not established with dermatologist in the area  Social Work Clinical Goal(s):  Marland Kitchen Over the next 30 days the patient will receive dermatology referral from her primary provider as desired by the patient  Interventions: Completed 04/09/19 . Communication received from patients primary provider who confirms dermatology referral sent to Dr. Ubaldo [name] on 12/15 . Outbound call placed to the patient to communicate referral date o Patient reports she has yet to hear from [facility] to schedule initial appointment . Collaboration with [facility] who requests referral be sent via fax to [phone] . Communication with primary provider regarding referral status; Dr. Ubaldo [name] referral coordinator to contact the patient for scheduling  Patient Self Care Activities:  . Patient verbalizes understanding of plan to follow up with dermatologist once referral is placed . Self administers medications as prescribed . Attends all scheduled provider appointments . Calls pharmacy for medication refills  Please see past updates related to this goal by clicking on the "Past Updates" button in the selected goal          Follow Up Plan: SW will follow up with patient by phone over the next three weeks.   Daneen Schick, BSW, CDP Social Worker,  Certified Dementia Practitioner [place] [facility] [place] [facility] [phone]

## 2019-04-11 ENCOUNTER — Telehealth: Payer: Self-pay

## 2019-04-25 ENCOUNTER — Ambulatory Visit: Payer: Medicare Other

## 2019-04-25 ENCOUNTER — Ambulatory Visit: Payer: Self-pay

## 2019-04-25 ENCOUNTER — Telehealth: Payer: Medicare Other

## 2019-04-25 ENCOUNTER — Other Ambulatory Visit: Payer: Self-pay

## 2019-04-25 DIAGNOSIS — I10 Essential (primary) hypertension: Secondary | ICD-10-CM

## 2019-04-25 DIAGNOSIS — D229 Melanocytic nevi, unspecified: Secondary | ICD-10-CM

## 2019-04-25 DIAGNOSIS — R7303 Prediabetes: Secondary | ICD-10-CM

## 2019-04-25 DIAGNOSIS — Z79899 Other long term (current) drug therapy: Secondary | ICD-10-CM

## 2019-04-25 DIAGNOSIS — G4730 Sleep apnea, unspecified: Secondary | ICD-10-CM

## 2019-04-25 NOTE — Patient Instructions (Signed)
Social Worker Visit Information  Goals we discussed today:  Goals Addressed            This Visit's Progress     Patient Stated   . "I need my medication called in" (pt-stated)       Current Barriers:  Marland Kitchen Medication refill needed for Cyclobenzaprine   Nurse Case Manager Clinical Goal(s):  Marland Kitchen Over the next 14 days, patient will verbalize understanding of plan for refilling and picking up her Rx for Cyclobenzaprine  CCM SW Interventions: . Collaboration with RN Case Manager regarding patient refill request . Communication with the patient via phone call to confirm RN Case Manager has sent refill request to the patients primary provider  Interventions:  . Collaborated with PCP provider [name] FNP via in basket message regarding patient's vm requesting a refill  for her Cyclobenzaprine   Patient Self Care Activities:  . Self administers medications as prescribed . Attends all scheduled provider appointments . Calls pharmacy for medication refills . Attends church or other social activities . Performs ADL's independently . Performs IADL's independently . Calls provider office for new concerns or questions  Initial goal documentation       Other   . COMPLETED: Assist with care coordination and referral to dermatology       Current Barriers:  . Not established with dermatologist in the area  Social Work Clinical Goal(s):  Marland Kitchen Over the next 30 days the patient will receive dermatology referral from her primary provider as desired by the patient  Interventions: Completed 04/25/19 . Outbound call placed to the patient to follow up on dermatology referral status . Confirmed the patient has been seen in practice by dermatologist o Area is considered Benign with no signs of inflammation or irritation o The patient will have yearly follow-ups to monitor areas . Goal Met  Patient Self Care Activities:  . Patient verbalizes understanding of plan to follow up with  dermatologist once referral is placed . Self administers medications as prescribed . Attends all scheduled provider appointments . Calls pharmacy for medication refills  Please see past updates related to this goal by clicking on the "Past Updates" button in the selected goal         Follow Up Plan: No SW follow up planned at this time. Please call me with future resource needs!   Daneen Schick, BSW, CDP Social Worker, Certified Dementia Practitioner [place] [facility] [place] [facility] [phone]

## 2019-04-25 NOTE — Chronic Care Management (AMB) (Signed)
Chronic Care Management    Social Work Follow Up Note  04/25/2019 Name: Brandi [name] MRN: [id number] DOB: [birth year]  Brandi [name] is a 64 y.o. year old female who is a primary care patient of [name], [place]. The CCM team was consulted for assistance with care coordination.   Review of patient status, including review of consultants reports, other relevant assessments, and collaboration with appropriate care team members and the patient's provider was performed as part of comprehensive patient evaluation and provision of chronic care management services.    SW placed a successful outbound call to the patient to assist with care coordination.  Outpatient Encounter Medications as of 04/25/2019  Medication Sig  . amLODipine (NORVASC) 10 MG tablet Take 1 tablet (10 mg total) by mouth daily.  Marland Kitchen atorvastatin (LIPITOR) 40 MG tablet Take one tablet by mouth once daily  . BYSTOLIC 20 MG TABS Take 1 tablet by mouth once daily  . cetirizine (ZYRTEC) 10 MG tablet Take 10 mg by mouth daily.  . Cholecalciferol (VITAMIN D) 50 MCG (2000 UT) CAPS Take 1 capsule by mouth daily.  . cloNIDine (CATAPRES) 0.3 MG tablet Take 1 tablet (0.3 mg total) by mouth daily. 1/2 tablet at bedtime  . cyclobenzaprine (FLEXERIL) 10 MG tablet 10 mg 3 (three) times daily.   . fluticasone (FLONASE ALLERGY RELIEF) 50 MCG/ACT nasal spray Flonase Allergy Relief 50 mcg/actuation nasal spray,suspension  INSTILL 2 SPRAYS IN EACH NOSTRIL QD  . gabapentin (NEURONTIN) 300 MG capsule Take 1 capsule (300 mg total) by mouth daily.  . hydrocortisone cream 0.5 % 1 application as needed.   . Magnesium 500 MG TABS Take 1 tablet by mouth daily.  Marland Kitchen rOPINIRole (REQUIP) 1 MG tablet Take 1 mg by mouth 2 (two) times daily.   Marland Kitchen topiramate (TOPAMAX) 25 MG tablet Take 1 tablet (25 mg total) by mouth daily.  . TRIAMCINOLONE ACETONIDE, TOP, 0.05 % OINT Apply 1 application topically as needed.  . Vitamin E 180 MG CAPS Take 1 capsule by mouth daily.     No facility-administered encounter medications on file as of 04/25/2019.     Goals Addressed            This Visit's Progress     Patient Stated   . "I need my medication called in" (pt-stated)       Current Barriers:  Marland Kitchen Medication refill needed for Cyclobenzaprine   Nurse Case Manager Clinical Goal(s):  Marland Kitchen Over the next 14 days, patient will verbalize understanding of plan for refilling and picking up her Rx for Cyclobenzaprine  CCM SW Interventions: . Collaboration with RN Case Manager regarding patient refill request . Communication with the patient via phone call to confirm RN Case Manager has sent refill request to the patients primary provider  Interventions:  . Collaborated with PCP provider [name] FNP via in basket message regarding patient's vm requesting a refill  for her Cyclobenzaprine   Patient Self Care Activities:  . Self administers medications as prescribed . Attends all scheduled provider appointments . Calls pharmacy for medication refills . Attends church or other social activities . Performs ADL's independently . Performs IADL's independently . Calls provider office for new concerns or questions  Initial goal documentation       Other   . COMPLETED: Assist with care coordination and referral to dermatology       Current Barriers:  . Not established with dermatologist in the area  Social Work Clinical Goal(s):  Marland Kitchen Over the next  30 days the patient will receive dermatology referral from her primary provider as desired by the patient  Interventions: Completed 04/25/19 . Outbound call placed to the patient to follow up on dermatology referral status . Confirmed the patient has been seen in practice by dermatologist o Area is considered Benign with no signs of inflammation or irritation o The patient will have yearly follow-ups to monitor areas . Goal Met  Patient Self Care Activities:  . Patient verbalizes understanding of plan to follow  up with dermatologist once referral is placed . Self administers medications as prescribed . Attends all scheduled provider appointments . Calls pharmacy for medication refills  Please see past updates related to this goal by clicking on the "Past Updates" button in the selected goal          Follow Up Plan: No further SW follow up planned at this time. The patient will remain active with RN Case Manager   Daneen Schick, BSW, CDP Social Worker, Certified Dementia Practitioner [place] [facility] [place] [facility] [phone]  Total time spent performing care coordination and/or care management activities with the patient by phone or face to face = 13 minutes.

## 2019-04-25 NOTE — Chronic Care Management (AMB) (Signed)
  Chronic Care Management   Follow Up Note   04/25/2019 Name: Brandi [name] MRN: [id number] DOB: Mar 17, 1956  Referred by: [name], FNP Reason for referral : Chronic Care Management (CCM RNCM Inbound Call )   Brandi [name] is a 64 y.o. year old female who is a primary care patient of [name], [place]. The CCM team was consulted for assistance with chronic disease management and care coordination needs.    Review of patient status, including review of consultants reports, relevant laboratory and other test results, and collaboration with appropriate care team members and the patient's provider was performed as part of comprehensive patient evaluation and provision of chronic care management services.    SDOH (Social Determinants of Health) screening performed today: None. See Care Plan for related entries.   Inbound call received from patient requesting a refill for her Cyclobenzaprine.   Outpatient Encounter Medications as of 04/25/2019  Medication Sig  . amLODipine (NORVASC) 10 MG tablet Take 1 tablet (10 mg total) by mouth daily.  Marland Kitchen atorvastatin (LIPITOR) 40 MG tablet Take one tablet by mouth once daily  . BYSTOLIC 20 MG TABS Take 1 tablet by mouth once daily  . cetirizine (ZYRTEC) 10 MG tablet Take 10 mg by mouth daily.  . Cholecalciferol (VITAMIN D) 50 MCG (2000 UT) CAPS Take 1 capsule by mouth daily.  . cloNIDine (CATAPRES) 0.3 MG tablet Take 1 tablet (0.3 mg total) by mouth daily. 1/2 tablet at bedtime  . cyclobenzaprine (FLEXERIL) 10 MG tablet 10 mg 3 (three) times daily.   . fluticasone (FLONASE ALLERGY RELIEF) 50 MCG/ACT nasal spray Flonase Allergy Relief 50 mcg/actuation nasal spray,suspension  INSTILL 2 SPRAYS IN EACH NOSTRIL QD  . gabapentin (NEURONTIN) 300 MG capsule Take 1 capsule (300 mg total) by mouth daily.  . hydrocortisone cream 0.5 % 1 application as needed.   . Magnesium 500 MG TABS Take 1 tablet by mouth daily.  Marland Kitchen rOPINIRole (REQUIP) 1 MG tablet Take 1 mg  by mouth 2 (two) times daily.   Marland Kitchen topiramate (TOPAMAX) 25 MG tablet Take 1 tablet (25 mg total) by mouth daily.  . TRIAMCINOLONE ACETONIDE, TOP, 0.05 % OINT Apply 1 application topically as needed.  . Vitamin E 180 MG CAPS Take 1 capsule by mouth daily.   No facility-administered encounter medications on file as of 04/25/2019.    Goals Addressed      Patient Stated   . "I need my medication called in" (pt-stated)       Current Barriers:  Marland Kitchen Medication refill needed for Cyclobenzaprine   Nurse Case Manager Clinical Goal(s):  Marland Kitchen Over the next 14 days, patient will verbalize understanding of plan for refilling and picking up her Rx for Cyclobenzaprine  Interventions:  . Collaborated with PCP provider [name] FNP via in basket message regarding patient's vm requesting a refill  for her Cyclobenzaprine   Patient Self Care Activities:  . Self administers medications as prescribed . Attends all scheduled provider appointments . Calls pharmacy for medication refills . Attends church or other social activities . Performs ADL's independently . Performs IADL's independently . Calls provider office for new concerns or questions  Initial goal documentation        Plan:   Telephone follow up appointment with care management team member scheduled for: 05/14/19  Barb Merino, RN, BSN, CCM Care Management Coordinator [place] [facility]  Direct Phone: [phone]

## 2019-04-28 ENCOUNTER — Other Ambulatory Visit: Payer: Self-pay

## 2019-04-28 MED ORDER — CYCLOBENZAPRINE HCL 10 MG PO TABS: 10.0000 mg | ORAL_TABLET | Freq: Three times a day (TID) | ORAL | 0 refills | Status: DC

## 2019-04-30 ENCOUNTER — Other Ambulatory Visit: Payer: Self-pay

## 2019-04-30 MED ORDER — CYCLOBENZAPRINE HCL 10 MG PO TABS: 10.0000 mg | ORAL_TABLET | Freq: Three times a day (TID) | ORAL | 1 refills | Status: DC

## 2019-05-14 ENCOUNTER — Telehealth: Payer: Self-pay

## 2019-05-15 ENCOUNTER — Other Ambulatory Visit: Payer: Self-pay | Admitting: Nurse Practitioner

## 2019-05-16 ENCOUNTER — Other Ambulatory Visit: Payer: Self-pay

## 2019-05-16 ENCOUNTER — Ambulatory Visit: Payer: Self-pay

## 2019-05-16 ENCOUNTER — Telehealth: Payer: Medicare Other

## 2019-05-16 DIAGNOSIS — R7303 Prediabetes: Secondary | ICD-10-CM

## 2019-05-16 DIAGNOSIS — G4730 Sleep apnea, unspecified: Secondary | ICD-10-CM

## 2019-05-16 DIAGNOSIS — I10 Essential (primary) hypertension: Secondary | ICD-10-CM

## 2019-05-16 DIAGNOSIS — Z79899 Other long term (current) drug therapy: Secondary | ICD-10-CM

## 2019-05-16 NOTE — Chronic Care Management (AMB) (Signed)
  Chronic Care Management   Outreach Note  05/16/2019 Name: Brandi [name] MRN: [id number] DOB: [birth year]  Referred by: [name], FNP Reason for referral : Chronic Care Management (FU Call - HTN, Prediabetes)   An unsuccessful telephone outreach was attempted today. The patient was referred to the case management team for assistance with care management and care coordination.   Follow Up Plan: Telephone follow up appointment with care management team member scheduled for:06/19/19  Barb Merino, RN, BSN, CCM Care Management Coordinator [place] [facility]  Direct Phone: [phone]

## 2019-05-16 NOTE — Telephone Encounter (Signed)
Gabapentin refill

## 2019-05-21 ENCOUNTER — Other Ambulatory Visit: Payer: Self-pay

## 2019-05-21 ENCOUNTER — Telehealth: Payer: Medicare Other

## 2019-05-21 ENCOUNTER — Ambulatory Visit (INDEPENDENT_AMBULATORY_CARE_PROVIDER_SITE_OTHER): Payer: Medicare Other

## 2019-05-21 ENCOUNTER — Telehealth: Payer: Self-pay

## 2019-05-21 DIAGNOSIS — I10 Essential (primary) hypertension: Secondary | ICD-10-CM

## 2019-05-21 DIAGNOSIS — Z79899 Other long term (current) drug therapy: Secondary | ICD-10-CM

## 2019-05-21 DIAGNOSIS — R7303 Prediabetes: Secondary | ICD-10-CM

## 2019-05-21 MED ORDER — ATORVASTATIN CALCIUM 40 MG PO TABS: ORAL_TABLET | ORAL | 3 refills | Status: DC

## 2019-05-22 NOTE — Patient Instructions (Signed)
Visit Information  Goals Addressed      Patient Stated   . COMPLETED: "I am concerned about having a SE to my BP meds" (pt-stated)       Current Barriers:  Marland Kitchen Knowledge Deficits related to treatment management for HTN . Chronic Disease Management support and education needs related to DMII, HTN, Sleep Apnea  Nurse Case Manager Clinical Goal(s):  Marland Kitchen Over the next 30 days, patient will work with the CCM team to address needs related to medication management for HTN  Goal Met . Over the next 90 days, patient will report having adequate BP control 130/80 or less than, w/o noted SE to the prescribed medications for this condition Goal Met  CCM RN CM Interventions:  02/24//21 call completed with patient . Evaluation of current treatment plan related to Hypertension and patient's adherence to plan as established by provider . Determined patient continues to Self monitor her BP weekly and is recording her readings; reports BP are at goal 130/70 and below  . Reviewed medications with patient and discussed patient is adhering to her prescribed medication regimen without noted SE at this time . Advised patient, providing education and rationale, to monitor blood pressure daily and record, calling the CCM team and or PCP for findings outside established parameters.  . Discussed plans with patient for ongoing care management follow up and provided patient with direct contact information for care management team  Patient Self Care Activities:  . Self administers medications as prescribed . Attends all scheduled provider appointments . Calls pharmacy for medication refills . Attends church or other social activities . Performs ADL's independently . Performs IADL's independently . Calls provider office for new concerns or questions  Please see past updates related to this goal by clicking on the "Past Updates" button in the selected goal      . COMPLETED: "I fell yesterday and am really sore"  (pt-stated)       Current Barriers:  Marland Kitchen Knowledge Deficits related to treatment management for Impaired Balance/gait and Shoulder pain  . Chronic Disease Management support and education needs related to DMII, HTN, Sleep Apnea  Clinical Social Work Clinical Goal(s):  Marland Kitchen Over the next 10 days the patient will follow up with her provider as directed by SW if pain worsens or persists Goal Met . Over the next 45 days the patient will work with CM team to identify fall hazards and develop a plan for future fall intervention strategies Goal Met . Over the next 90 days the patient will not experience a fall as a result of following established plan of care Goal Met  CCM RN CM Interventions:  05/21/19: Call completed with patient . Determined Ms. Kingston completed PT and continues to follow her HEP . Patient voices no fall concerns or home safety concerns at this time . Reiterated importance of keeping the CCM team and PCP of any/all falls and or home safety concerns that may arise . Discussed plans with patient for ongoing care management follow up and provided patient with direct contact information for care management team  Patient Self Care Activities:  . Self administers medications as prescribed . Attends all scheduled provider appointments . Calls provider office for new concerns or questions  Please see past updates related to this goal by clicking on the "Past Updates" button in the selected goal      . "I know I can learn to eat a Lorren Rossetti better" (pt-stated)       Current Barriers:  .  Knowledge Deficits related to disease process and treatment management of Diabetes . Chronic Disease Management support and education needs related to DMII, HTN, Sleep Apnea  Nurse Case Manager Clinical Goal(s):  Marland Kitchen Over the next 90 days, patient will work with the CCM team to address needs related to improved diabetes disease management   CCM RN CM Interventions:  05/21/19 call completed with patient   . Evaluation of current treatment plan related to Diabetes and patient's adherence to plan as established by provider . Reinforced education to patient re: daily glycemic goal is to achieve glucose levels between 80-130; reiterated target A1C range of less than 5.7% for optimal management and to return to normal range; Reinforced Self Care interventions to lower A1C including implementing daily exercise regimen of 150 minutes weekly; eating a low carb, low sugar diet with use of Meal planning and portion control; discussed importance of maintaining weight within normal BMI and taking prescribed medications as directed  . Discussed Ms. Cumbee is newly established with Dr. Wynema Birch an integrated medicine doctor to help establish a homeopathic DM treatment plan  . Reviewed medications with patient and discussed current pharmacological treatment for DM including indication, dosage and frequency of meds . Advised patient, providing education and rationale, to check cbg daily before meals and record, calling the CCM team and or PCP for findings outside established parameters .  Discussed plans with patient for ongoing care management follow up and provided patient with direct contact information for care management team  Patient Self Care Activities:  . Self administers medications as prescribed . Attends all scheduled provider appointments . Calls pharmacy for medication refills . Attends church or other social activities . Performs ADL's independently . Performs IADL's independently . Calls provider office for new concerns or questions  Please see past updates related to this goal by clicking on the "Past Updates" button in the selected goal      . COMPLETED: "I need my medication called in" (pt-stated)       Current Barriers:  Marland Kitchen Medication refill needed for Cyclobenzaprine   Nurse Case Manager Clinical Goal(s):  Marland Kitchen Over the next 14 days, patient will verbalize understanding of plan for  refilling and picking up her Rx for Cyclobenzaprine  Goal Met   CCM RN CM Interventions:  05/21/19 call completed with patient  . Confirmed patient refilled her Cyclobenzaprine without difficulty and is taking this medication as directed  Patient Self Care Activities:  . Self administers medications as prescribed . Attends all scheduled provider appointments . Calls pharmacy for medication refills . Attends church or other social activities . Performs ADL's independently . Performs IADL's independently . Calls provider office for new concerns or questions  Initial goal documentation     . I would like to get better control of my blood pressure so I can feel better (pt-stated)   On track    Current Barriers:  . Uncontrolled hypertension, complicated by obesity, hyperlipidemia . Current antihypertensive regimen: amlodipine 37SE, bystolic, clonidine  . Previous antihypertensives tried: hydralazine, spironolactone . Current allergy/intolerance list:  o spironolactone, furosemide, and hydrochlorothiazide o Sulfa allergy  . Current home BP readings: 130-140s/80-100s . Patient complaining of edema.  Likely causes amlodipine vs venous statsis vs cardiac . Patient does not like how she feels on her current medications.  Unfortunately, there are limited options when it comes to her anti-hypertensive regimen.  Pharmacist Clinical Goal(s):  Marland Kitchen Over the next 90 days, patient will work with PharmD and providers to  optimize antihypertensive regimen  CCM RN CM Interventions: 05/21/19 call completed with patient  . Determined patient is Self monitoring her BP and reports all readings are WNL (equal to or less than 130/80) . Evaluation of current treatment plan related to HTN and patient's adherence to plan as established by provider. . Reviewed medications with patient and discussed patient is adhering to her prescribed medication regimen for HTN and denies having noted SE . Discussed plans with  patient for ongoing care management follow up and provided patient with direct contact information for care management team . Advised patient, providing education and rationale, to monitor blood pressure daily and record, calling the CCM team and or PCP for findings outside established parameters.  . Reviewed scheduled/upcoming provider appointments including: 06/09/19 @ 10:30 AM   Patient Self Care Activities:  . Patient will continue to check BP daily , document, and provide at future appointments . Patient will focus on medication adherence by continuing to take medications as prescribed.  Please see past updates related to this goal by clicking on the "Past Updates" button in the selected goal         Patient verbalizes understanding of instructions provided today.   Telephone follow up appointment with care management team member scheduled for: 06/19/19  Barb Merino, RN, BSN, CCM Care Management Coordinator [place] [facility]  Direct Phone: [phone]

## 2019-05-22 NOTE — Chronic Care Management (AMB) (Signed)
Chronic Care Management   Follow Up Note   05/21/2019 Name: Brandi [name] MRN: [id number] DOB: [birth year]  Referred by: [name], FNP Reason for referral : Chronic Care Management (FU Call - HTN, DM)   Brandi [name] is a 64 y.o. year old female who is a primary care patient of [name], [place]. The CCM team was consulted for assistance with chronic disease management and care coordination needs.    Review of patient status, including review of consultants reports, relevant laboratory and other test results, and collaboration with appropriate care team members and the patient's provider was performed as part of comprehensive patient evaluation and provision of chronic care management services.    SDOH (Social Determinants of Health) assessments performed: No See Care Plan activities for detailed interventions related to [place])   Inbound call received from patient to provide a DM/HTN update.     Outpatient Encounter Medications as of 05/21/2019  Medication Sig  . amLODipine (NORVASC) 10 MG tablet Take 1 tablet (10 mg total) by mouth daily.  Marland Kitchen atorvastatin (LIPITOR) 40 MG tablet Take one tablet by mouth once daily  . BYSTOLIC 20 MG TABS Take 1 tablet by mouth once daily  . cetirizine (ZYRTEC) 10 MG tablet Take 10 mg by mouth daily.  . Cholecalciferol (VITAMIN D) 50 MCG (2000 UT) CAPS Take 1 capsule by mouth daily.  . cloNIDine (CATAPRES) 0.3 MG tablet Take 1 tablet (0.3 mg total) by mouth daily. 1/2 tablet at bedtime  . cyclobenzaprine (FLEXERIL) 10 MG tablet Take 1 tablet (10 mg total) by mouth 3 (three) times daily.  . fluticasone (FLONASE ALLERGY RELIEF) 50 MCG/ACT nasal spray Flonase Allergy Relief 50 mcg/actuation nasal spray,suspension  INSTILL 2 SPRAYS IN EACH NOSTRIL QD  . gabapentin (NEURONTIN) 300 MG capsule TAKE 1 CAPSULE(300 MG) BY MOUTH DAILY  . hydrocortisone cream 0.5 % 1 application as needed.   . Magnesium 500 MG TABS Take 1 tablet by mouth daily.  Marland Kitchen rOPINIRole  (REQUIP) 1 MG tablet Take 1 mg by mouth 2 (two) times daily.   Marland Kitchen topiramate (TOPAMAX) 25 MG tablet Take 1 tablet (25 mg total) by mouth daily.  . TRIAMCINOLONE ACETONIDE, TOP, 0.05 % OINT Apply 1 application topically as needed.  . Vitamin E 180 MG CAPS Take 1 capsule by mouth daily.  . [DISCONTINUED] atorvastatin (LIPITOR) 40 MG tablet Take one tablet by mouth once daily   No facility-administered encounter medications on file as of 05/21/2019.     Objective:  Lab Results  Component Value Date   HGBA1C 6.6 (H) 01/06/2019   HGBA1C 6.5 (H) 09/30/2018   Lab Results  Component Value Date   MICROALBUR 10 09/30/2018   LDLCALC 97 01/06/2019   CREATININE 0.77 01/27/2019   BP Readings from Last 3 Encounters:  03/11/19 132/86  02/28/19 138/86  01/27/19 (!) 138/104    Goals Addressed      Patient Stated   . COMPLETED: "I am concerned about having a SE to my BP meds" (pt-stated)       Current Barriers:  Marland Kitchen Knowledge Deficits related to treatment management for HTN . Chronic Disease Management support and education needs related to DMII, HTN, Sleep Apnea  Nurse Case Manager Clinical Goal(s):  Marland Kitchen Over the next 30 days, patient will work with the CCM team to address needs related to medication management for HTN  Goal Met . Over the next 90 days, patient will report having adequate BP control 130/80 or less than, w/o noted SE to  the prescribed medications for this condition Goal Met  CCM RN CM Interventions:  02/24//21 call completed with patient . Evaluation of current treatment plan related to Hypertension and patient's adherence to plan as established by provider . Determined patient continues to Self monitor her BP weekly and is recording her readings; reports BP are at goal 130/70 and below  . Reviewed medications with patient and discussed patient is adhering to her prescribed medication regimen without noted SE at this time . Advised patient, providing education and rationale, to  monitor blood pressure daily and record, calling the CCM team and or PCP for findings outside established parameters.  . Discussed plans with patient for ongoing care management follow up and provided patient with direct contact information for care management team  Patient Self Care Activities:  . Self administers medications as prescribed . Attends all scheduled provider appointments . Calls pharmacy for medication refills . Attends church or other social activities . Performs ADL's independently . Performs IADL's independently . Calls provider office for new concerns or questions  Please see past updates related to this goal by clicking on the "Past Updates" button in the selected goal      . COMPLETED: "I fell yesterday and am really sore" (pt-stated)       Current Barriers:  Marland Kitchen Knowledge Deficits related to treatment management for Impaired Balance/gait and Shoulder pain  . Chronic Disease Management support and education needs related to DMII, HTN, Sleep Apnea  Clinical Social Work Clinical Goal(s):  Marland Kitchen Over the next 10 days the patient will follow up with her provider as directed by SW if pain worsens or persists Goal Met . Over the next 45 days the patient will work with CM team to identify fall hazards and develop a plan for future fall intervention strategies Goal Met . Over the next 90 days the patient will not experience a fall as a result of following established plan of care Goal Met  CCM RN CM Interventions:  05/21/19: Call completed with patient . Determined Brandi [name] completed PT and continues to follow her HEP . Patient voices no fall concerns or home safety concerns at this time . Reiterated importance of keeping the CCM team and PCP of any/all falls and or home safety concerns that may arise . Discussed plans with patient for ongoing care management follow up and provided patient with direct contact information for care management team  Patient Self Care  Activities:  . Self administers medications as prescribed . Attends all scheduled provider appointments . Calls provider office for new concerns or questions  Please see past updates related to this goal by clicking on the "Past Updates" button in the selected goal      . "I know I can learn to eat a Daymian Lill better" (pt-stated)       Current Barriers:  Marland Kitchen Knowledge Deficits related to disease process and treatment management of Diabetes . Chronic Disease Management support and education needs related to DMII, HTN, Sleep Apnea  Nurse Case Manager Clinical Goal(s):  Marland Kitchen Over the next 90 days, patient will work with the CCM team to address needs related to improved diabetes disease management   CCM RN CM Interventions:  05/21/19 call completed with patient  . Evaluation of current treatment plan related to Diabetes and patient's adherence to plan as established by provider . Reinforced education to patient re: daily glycemic goal is to achieve glucose levels between 80-130; reiterated target A1C range of less than 5.7% for  optimal management and to return to normal range; Reinforced Self Care interventions to lower A1C including implementing daily exercise regimen of 150 minutes weekly; eating a low carb, low sugar diet with use of Meal planning and portion control; discussed importance of maintaining weight within normal BMI and taking prescribed medications as directed  . Discussed Brandi [name] is newly established with Dr. Wynema Birch an integrated medicine doctor to help establish a homeopathic DM treatment plan  . Reviewed medications with patient and discussed current pharmacological treatment for DM including indication, dosage and frequency of meds . Advised patient, providing education and rationale, to check cbg daily before meals and record, calling the CCM team and or PCP for findings outside established parameters .  Discussed plans with patient for ongoing care management follow up  and provided patient with direct contact information for care management team  Patient Self Care Activities:  . Self administers medications as prescribed . Attends all scheduled provider appointments . Calls pharmacy for medication refills . Attends church or other social activities . Performs ADL's independently . Performs IADL's independently . Calls provider office for new concerns or questions  Please see past updates related to this goal by clicking on the "Past Updates" button in the selected goal      . COMPLETED: "I need my medication called in" (pt-stated)       Current Barriers:  Marland Kitchen Medication refill needed for Cyclobenzaprine   Nurse Case Manager Clinical Goal(s):  Marland Kitchen Over the next 14 days, patient will verbalize understanding of plan for refilling and picking up her Rx for Cyclobenzaprine  Goal Met   CCM RN CM Interventions:  05/21/19 call completed with patient  . Confirmed patient refilled her Cyclobenzaprine without difficulty and is taking this medication as directed  Patient Self Care Activities:  . Self administers medications as prescribed . Attends all scheduled provider appointments . Calls pharmacy for medication refills . Attends church or other social activities . Performs ADL's independently . Performs IADL's independently . Calls provider office for new concerns or questions  Initial goal documentation     . I would like to get better control of my blood pressure so I can feel better (pt-stated)   On track    Current Barriers:  . Uncontrolled hypertension, complicated by obesity, hyperlipidemia . Current antihypertensive regimen: amlodipine 37JI, bystolic, clonidine  . Previous antihypertensives tried: hydralazine, spironolactone . Current allergy/intolerance list:  o spironolactone, furosemide, and hydrochlorothiazide o Sulfa allergy  . Current home BP readings: 130-140s/80-100s . Patient complaining of edema.  Likely causes amlodipine vs venous  statsis vs cardiac . Patient does not like how she feels on her current medications.  Unfortunately, there are limited options when it comes to her anti-hypertensive regimen.  Pharmacist Clinical Goal(s):  Marland Kitchen Over the next 90 days, patient will work with PharmD and providers to optimize antihypertensive regimen  CCM RN CM Interventions: 05/21/19 call completed with patient  . Determined patient is Self monitoring her BP and reports all readings are WNL (equal to or less than 130/80) . Evaluation of current treatment plan related to HTN and patient's adherence to plan as established by provider. . Reviewed medications with patient and discussed patient is adhering to her prescribed medication regimen for HTN and denies having noted SE . Discussed plans with patient for ongoing care management follow up and provided patient with direct contact information for care management team . Advised patient, providing education and rationale, to monitor blood pressure daily and record,  calling the CCM team and or PCP for findings outside established parameters.  . Reviewed scheduled/upcoming provider appointments including: 06/09/19 @ 10:30 AM   Patient Self Care Activities:  . Patient will continue to check BP daily , document, and provide at future appointments . Patient will focus on medication adherence by continuing to take medications as prescribed.  Please see past updates related to this goal by clicking on the "Past Updates" button in the selected goal         Plan:   Telephone follow up appointment with care management team member scheduled for:06/19/19  Barb Merino, RN, BSN, CCM Care Management Coordinator [facility]  Direct Phone: [phone]

## 2019-05-31 ENCOUNTER — Encounter: Payer: Self-pay | Admitting: Nurse Practitioner

## 2019-06-09 ENCOUNTER — Other Ambulatory Visit: Payer: Self-pay

## 2019-06-09 ENCOUNTER — Ambulatory Visit (INDEPENDENT_AMBULATORY_CARE_PROVIDER_SITE_OTHER): Payer: Medicare Other | Admitting: Nurse Practitioner

## 2019-06-09 ENCOUNTER — Encounter: Payer: Self-pay | Admitting: Nurse Practitioner

## 2019-06-09 VITALS — BP 138/88 | HR 97 | Temp 98.2°F | Ht 64.2 in | Wt 320.0 lb

## 2019-06-09 DIAGNOSIS — I10 Essential (primary) hypertension: Secondary | ICD-10-CM

## 2019-06-09 DIAGNOSIS — I110 Hypertensive heart disease with heart failure: Secondary | ICD-10-CM | POA: Diagnosis not present

## 2019-06-09 DIAGNOSIS — Z6841 Body Mass Index (BMI) 40.0 and over, adult: Secondary | ICD-10-CM

## 2019-06-09 DIAGNOSIS — R7303 Prediabetes: Secondary | ICD-10-CM | POA: Diagnosis not present

## 2019-06-09 DIAGNOSIS — E2839 Other primary ovarian failure: Secondary | ICD-10-CM

## 2019-06-09 DIAGNOSIS — Z1231 Encounter for screening mammogram for malignant neoplasm of breast: Secondary | ICD-10-CM

## 2019-06-09 NOTE — Progress Notes (Signed)
Subjective:     Patient ID: Brandi [name] , female    DOB: 12/06/1955 , 64 y.o.   MRN: [id number]   Chief Complaint  Patient presents with  . Diabetes  . Hypertension    HPI  She had Pfizer injection on Friday and April 5th for her second - arm soreness.   Dr. Coralee Pesa - nutritionist - she states she is trying to not have to take "metformin" or any other medications for diabetes. Blood pressure ranging 90-154/50-98, when she had a low blood pressure she increased her fluid intake.  Blood sugar has been as high as 158 after eating cake and ice cream the night before.   Wt Readings from Last 3 Encounters: 06/09/19 : (!) 320 lb (145.2 kg) 03/11/19 : 232 lb 9.6 oz (105.5 kg) 02/28/19 : (!) 329 lb 12.8 oz (149.6 kg)   Hypertension This is a chronic problem. The current episode started more than 1 year ago. The problem is uncontrolled. Pertinent negatives include no anxiety, blurred vision, chest pain, headaches or palpitations. Risk factors for coronary artery disease include obesity, dyslipidemia and sedentary lifestyle. There are no compliance problems.  There is no history of angina. There is no history of chronic renal disease.  Diabetes She presents for her follow-up diabetic visit. She has type 2 diabetes mellitus. Her disease course has been stable. Pertinent negatives for hypoglycemia include no dizziness or headaches. Pertinent negatives for diabetes include no blurred vision, no chest pain, no fatigue, no polydipsia, no polyphagia and no polyuria. Symptoms are stable. There are no diabetic complications. Risk factors for coronary artery disease include dyslipidemia, obesity and sedentary lifestyle. Current diabetic treatment includes diet. She is compliant with treatment all of the time. Home blood sugar record trend: fluctuating. (Range 107- 158 )     Past Medical History:  Diagnosis Date  . Arthritis   . Hypertension   . Hypertensive crisis   . Osteoporosis   . Rheumatoid  arthritis ([place])   . Sleep apnea      Family History  Problem Relation Age of Onset  . Hypertension Mother   . Cancer Mother   . Heart disease Mother   . Kidney disease Father   . Heart disease Father   . Diabetes Father   . Hypertension Sister      Current Outpatient Medications:  .  amLODipine (NORVASC) 10 MG tablet, Take 1 tablet (10 mg total) by mouth daily., Disp: 90 tablet, Rfl: 3 .  atorvastatin (LIPITOR) 40 MG tablet, Take one tablet by mouth once daily, Disp: 90 tablet, Rfl: 3 .  BYSTOLIC 20 MG TABS, Take 1 tablet by mouth once daily, Disp: 90 tablet, Rfl: 3 .  cetirizine (ZYRTEC) 10 MG tablet, Take 10 mg by mouth daily., Disp: , Rfl:  .  Cholecalciferol (VITAMIN D) 50 MCG (2000 UT) CAPS, Take 1 capsule by mouth daily., Disp: , Rfl:  .  cyclobenzaprine (FLEXERIL) 10 MG tablet, Take 1 tablet (10 mg total) by mouth 3 (three) times daily., Disp: 270 tablet, Rfl: 1 .  fluticasone (FLONASE ALLERGY RELIEF) 50 MCG/ACT nasal spray, Flonase Allergy Relief 50 mcg/actuation nasal spray,suspension  INSTILL 2 SPRAYS IN EACH NOSTRIL QD, Disp: , Rfl:  .  gabapentin (NEURONTIN) 300 MG capsule, TAKE 1 CAPSULE(300 MG) BY MOUTH DAILY, Disp: 90 capsule, Rfl: 0 .  hydrocortisone cream 0.5 %, 1 application as needed. , Disp: , Rfl:  .  Magnesium 500 MG TABS, Take 1 tablet by mouth daily., Disp: , Rfl:  .  rOPINIRole (REQUIP) 1 MG tablet, Take 1 mg by mouth 2 (two) times daily. , Disp: , Rfl:  .  topiramate (TOPAMAX) 25 MG tablet, Take 1 tablet (25 mg total) by mouth daily., Disp: 90 tablet, Rfl: 1 .  TRIAMCINOLONE ACETONIDE, TOP, 0.05 % OINT, Apply 1 application topically as needed., Disp: , Rfl:  .  Vitamin E 180 MG CAPS, Take 1 capsule by mouth daily., Disp: , Rfl:  .  cloNIDine (CATAPRES) 0.3 MG tablet, Take 1 tablet (0.3 mg total) by mouth daily. 1/2 tablet at bedtime (Patient not taking: Reported on 06/09/2019), Disp: 135 tablet, Rfl: 3   Allergies  Allergen Reactions  . Hydralazine Itching   . Meperidine Hcl Other (See Comments)  . Aliskiren Hives  . Aspirin Nausea Only  . Latex     hives  . Oxycodone-Acetaminophen Itching  . Rofecoxib Diarrhea  . Spironolactone Other (See Comments)  . Furosemide Other (See Comments)  . Sulfa Antibiotics Rash     Review of Systems  Constitutional: Negative.  Negative for fatigue.  Eyes: Negative for blurred vision.  Respiratory: Negative.   Cardiovascular: Negative.  Negative for chest pain, palpitations and leg swelling.  Endocrine: Negative for polydipsia, polyphagia and polyuria.  Neurological: Negative for dizziness and headaches.  Psychiatric/Behavioral: Negative.      Today's Vitals   06/09/19 1042  BP: 138/88  Pulse: 97  Temp: 98.2 F (36.8 C)  TempSrc: Oral  Weight: (!) 320 lb (145.2 kg)  Height: 5' 4.2" (1.631 m)  PainSc: 0-No pain   Body mass index is 54.59 kg/m.   Objective:  Physical Exam Vitals reviewed.  Constitutional:      General: She is not in acute distress.    Appearance: Normal appearance. She is obese.  Cardiovascular:     Rate and Rhythm: Normal rate and regular rhythm.     Pulses: Normal pulses.     Heart sounds: Normal heart sounds. No murmur.  Pulmonary:     Effort: Pulmonary effort is normal. No respiratory distress.     Breath sounds: Normal breath sounds.  Musculoskeletal:     Right lower leg: No edema.     Left lower leg: No edema.  Skin:    General: Skin is warm and dry.     Capillary Refill: Capillary refill takes less than 2 seconds.  Neurological:     General: No focal deficit present.     Mental Status: She is alert and oriented to person, place, and time.  Psychiatric:        Mood and Affect: Mood normal.        Behavior: Behavior normal.        Thought Content: Thought content normal.        Judgment: Judgment normal.         Assessment And Plan:     1. Essential hypertension B/P is fairly controlled.  She reports she has had recent labs with Dr. Coralee Pesa.   The  importance of regular exercise and dietary modification was stressed to the patient.  - Lipid panel  2. Prediabetes  Chronic, controlled  She is working with Dr. Wynema Birch for her diabetes management  No current medications  Encouraged to limit intake of sugary foods and drinks  Encouraged to increase physical activity to 150 minutes per week as tolerated. 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 Tamala Julian next month.  4. Morbid obesity ([place])  Chronic   She has lost 12 lbs since her last office visit.  5. Decreased estrogen level  Ordered bone density  6. Encounter for screening mammogram for malignant neoplasm of breast  Pt instructed on Self Breast Exam.According to ACOG guidelines Women aged 36 and older are recommended to get an annual mammogram. Form completed and given to patient contact the [facility] for appointment scheduing.   Pt encouraged to get annual mammogram - MM DIGITAL SCREENING BILATERAL; Future  I am still awaiting records from her primary in [place] to update her other Health Maintenance        [name], FNP    THE PATIENT IS ENCOURAGED TO PRACTICE SOCIAL DISTANCING DUE TO THE COVID-19 PANDEMIC.

## 2019-06-10 ENCOUNTER — Ambulatory Visit: Payer: Medicare Other | Admitting: Nurse Practitioner

## 2019-06-19 ENCOUNTER — Telehealth: Payer: Self-pay

## 2019-07-30 ENCOUNTER — Telehealth: Payer: Self-pay

## 2019-07-30 NOTE — Telephone Encounter (Signed)
Pt results from [facility] results was Negative pt aware

## 2019-08-04 ENCOUNTER — Encounter: Payer: Self-pay | Admitting: Nurse Practitioner

## 2019-08-11 ENCOUNTER — Other Ambulatory Visit: Payer: Self-pay | Admitting: Nurse Practitioner

## 2019-08-13 ENCOUNTER — Telehealth: Payer: Self-pay

## 2019-08-13 ENCOUNTER — Other Ambulatory Visit: Payer: Self-pay | Admitting: Nurse Practitioner

## 2019-09-10 ENCOUNTER — Ambulatory Visit
Admission: RE | Admit: 2019-09-10 | Discharge: 2019-09-10 | Disposition: A | Discharge status: Home / Self Care | Payer: Medicare Other | Source: Ambulatory Visit | Attending: Nurse Practitioner | Admitting: Nurse Practitioner

## 2019-09-10 ENCOUNTER — Other Ambulatory Visit: Payer: Self-pay | Admitting: Nurse Practitioner

## 2019-09-10 ENCOUNTER — Other Ambulatory Visit: Payer: Self-pay

## 2019-09-10 DIAGNOSIS — Z1231 Encounter for screening mammogram for malignant neoplasm of breast: Secondary | ICD-10-CM

## 2019-09-10 DIAGNOSIS — E2839 Other primary ovarian failure: Secondary | ICD-10-CM

## 2019-09-16 ENCOUNTER — Telehealth: Payer: Self-pay

## 2019-10-06 ENCOUNTER — Encounter: Payer: Self-pay | Admitting: Nurse Practitioner

## 2019-10-08 NOTE — Progress Notes (Signed)
This visit occurred during the SARS-CoV-2 public health emergency.  Safety protocols were in place, including screening questions prior to the visit, additional usage of staff PPE, and extensive cleaning of exam room while observing appropriate contact time as indicated for disinfecting solutions.  Subjective:     Patient ID: Brandi [name] , female    DOB: [birth year] , 64 y.o.   MRN: [id number]   Chief Complaint  Patient presents with  . Diabetes  . 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   Wt Readings from Last 3 Encounters: 10/09/19 : 296 lb 9.6 oz (134.5 kg) 06/09/19 : (!) 320 lb (145.2 kg) 03/11/19 : 232 lb 9.6 oz (105.5 kg)  She has had a hysterectomy.       Diabetes She presents for her follow-up diabetic visit. She has type 2 diabetes mellitus. Her disease course has been stable. Pertinent negatives for hypoglycemia include no dizziness or headaches. Pertinent negatives for diabetes include no blurred vision, no chest pain, no fatigue, no polydipsia, no polyphagia and no polyuria. Symptoms are stable. There are no diabetic complications. Risk factors for coronary artery disease include dyslipidemia, obesity and sedentary lifestyle. Current diabetic treatment includes diet. She is compliant with treatment all of the time. She is following a diabetic diet. When asked about meal planning, she reported none. She has had a previous visit with a dietitian. She rarely participates in exercise. Home blood sugar record trend: fluctuating. (Range 99-137) She does not see a podiatrist. Hypertension This is a chronic problem. The current episode started more than 1 year ago. The problem is uncontrolled. Pertinent negatives include no anxiety, blurred vision, chest pain, headaches or palpitations. Risk factors for coronary artery disease include obesity, dyslipidemia and sedentary lifestyle. Past treatments  include calcium channel blockers and direct vasodilators. The current treatment provides moderate improvement. There are no compliance problems.  There is no history of angina. There is no history of chronic renal disease.     Past Medical History:  Diagnosis Date  . Arthritis   . Hypertension   . Hypertensive crisis   . Osteoporosis   . Rheumatoid arthritis ([place])   . Sleep apnea      Family History  Problem Relation Age of Onset  . Hypertension Mother   . Cancer Mother   . Heart disease Mother   . Kidney disease Father   . Heart disease Father   . Diabetes Father   . Hypertension Sister      Current Outpatient Medications:  .  amLODipine (NORVASC) 10 MG tablet, Take 1 tablet (10 mg total) by mouth daily., Disp: 90 tablet, Rfl: 3 .  atorvastatin (LIPITOR) 40 MG tablet, Take one tablet by mouth once daily, Disp: 90 tablet, Rfl: 3 .  BYSTOLIC 20 MG TABS, Take 1 tablet by mouth once daily, Disp: 90 tablet, Rfl: 3 .  cetirizine (ZYRTEC) 10 MG tablet, Take 10 mg by mouth daily., Disp: , Rfl:  .  Cholecalciferol (VITAMIN D) 50 MCG (2000 UT) CAPS, Take 1 capsule by mouth daily., Disp: , Rfl:  .  cyclobenzaprine (FLEXERIL) 10 MG tablet, Take 1 tablet (10 mg total) by mouth 3 (three) times daily., Disp: 270 tablet, Rfl: 1 .  fluticasone (FLONASE ALLERGY RELIEF) 50 MCG/ACT nasal spray, Flonase Allergy Relief 50 mcg/actuation nasal spray,suspension  INSTILL 2 SPRAYS IN EACH NOSTRIL QD, Disp: , Rfl:  .  gabapentin (NEURONTIN) 300 MG capsule, TAKE 1 CAPSULE(300 MG) BY MOUTH DAILY, Disp: 90 capsule, Rfl: 0 .  hydrocortisone cream 0.5 %, 1 application as needed. , Disp: , Rfl:  .  Magnesium 500 MG TABS, Take 1 tablet by mouth daily., Disp: , Rfl:  .  PRESCRIPTION MEDICATION, STRENGTIA 30MG BID, Disp: , Rfl:  .  PRESCRIPTION MEDICATION, HEPATO-CI BID, Disp: , Rfl:  .  PRESCRIPTION MEDICATION, OMEGA CO3-SE BID, Disp: , Rfl:  .  PRESCRIPTION MEDICATION, b12 GD ONE DROPPER, Disp: , Rfl:  .   PRESCRIPTION MEDICATION, GLYSEN BID, Disp: , Rfl:  .  rOPINIRole (REQUIP) 1 MG tablet, Take 1 mg by mouth 2 (two) times daily. , Disp: , Rfl:  .  topiramate (TOPAMAX) 25 MG tablet, TAKE 1 TABLET(25 MG) BY MOUTH DAILY, Disp: 90 tablet, Rfl: 1 .  TRIAMCINOLONE ACETONIDE, TOP, 0.05 % OINT, Apply 1 application topically as needed., Disp: , Rfl:  .  Vitamin E 180 MG CAPS, Take 1 capsule by mouth daily., Disp: , Rfl:  .  pneumococcal 13-valent conjugate vaccine (PREVNAR 13) SUSP injection, Inject 0.5 mLs into the muscle tomorrow at 10 am for 1 dose., Disp: 0.5 mL, Rfl: 0 .  Tdap (BOOSTRIX) 5-2.5-18.5 LF-MCG/0.5 injection, Inject 0.5 mLs into the muscle once for 1 dose., Disp: 0.5 mL, Rfl: 0   Allergies  Allergen Reactions  . Hydralazine Itching  . Meperidine Hcl Other (See Comments)  . Aliskiren Hives  . Aspirin Nausea Only  . Latex     hives  . Oxycodone-Acetaminophen Itching  . Rofecoxib Diarrhea  . Spironolactone Other (See Comments)  . Furosemide Other (See Comments)  . Sulfa Antibiotics Rash      Review of Systems  Constitutional: Negative.  Negative for fatigue.  Eyes: Negative.  Negative for blurred vision.  Respiratory: Negative.   Cardiovascular: Negative.  Negative for chest pain, palpitations and leg swelling.  Endocrine: Negative for polydipsia, polyphagia and polyuria.  Musculoskeletal: Negative.   Skin: Negative.   Neurological: Negative for dizziness, light-headedness and headaches.       Intermittent balance issues  Psychiatric/Behavioral: Negative.      Today's Vitals   10/09/19 1023  BP: 130/86  Pulse: 93  Temp: 98 F (36.7 C)  TempSrc: Oral  Weight: 296 lb 9.6 oz (134.5 kg)  Height: 5' 4.2" (1.631 m)  PainSc: 0-No pain   Body mass index is 50.59 kg/m.   Objective:  Physical Exam Vitals reviewed.  Constitutional:      General: She is not in acute distress.    Appearance: Normal appearance. She is obese.  Cardiovascular:     Rate and Rhythm: Normal  rate and regular rhythm.     Pulses: Normal pulses.     Heart sounds: Normal heart sounds. No murmur heard.   Pulmonary:     Effort: Pulmonary effort is normal. No respiratory distress.     Breath sounds: Normal breath sounds.  Skin:    General: Skin is warm and dry.     Capillary Refill: Capillary refill takes less than 2 seconds.  Neurological:     General: No focal deficit present.     Mental Status: She is alert and oriented to person, place, and time.     Cranial Nerves: No cranial nerve deficit.  Psychiatric:        Mood and Affect: Mood normal.        Behavior: Behavior normal.        Thought Content: Thought content normal.  Judgment: Judgment normal.         Assessment And Plan:    1. Essential hypertension . B/P is fairly controlled.  . CMP ordered to check renal function.  . Continue regular exercise and dietary modification  . Continue weight loss would help with decreasing cardiac and cancer risk as well.  - CMP14+EGFR  2. Prediabetes  Chronic, stable  Continue with current medications  Encouraged to limit intake of sugary foods and drinks  Encouraged to increase physical activity to 150 minutes per week as tolerated - Hemoglobin A1c  3. Hypertensive heart disease with heart failure (HCC)  Chronic, stable  Continue follow up with Cardiology - CMP14+EGFR  4. Morbid obesity ([place])  Chronic, she has lost approx 8 lbs since her last office visit  5. Mixed hyperlipidemia  Chronic, controlled  Continue with current medications  Tolerating medications well - Lipid panel  6. Encounter for immunization Sent Rx to pharmacy due to Medicare TDAP will be administered to adults 60-14 years old every 10 years. Prevnar 13 vaccine sent to pharmacy - Tdap ([place]) 5-2.5-18.5 LF-MCG/0.5 injection; Inject 0.5 mLs into the muscle once for 1 dose.  Dispense: 0.5 mL; Refill: 0 - pneumococcal 13-valent conjugate vaccine (PREVNAR 13) SUSP injection; Inject  0.5 mLs into the muscle tomorrow at 10 am for 1 dose.  Dispense: 0.5 mL; Refill: 0  7. Imbalance  She does have decreased sensation bilateral feet which could be contributing to her imbalance  Will also check vitamin B12  If not better may need to check EMG/NCV - Vitamin B12    Patient was given opportunity to ask questions. Patient verbalized understanding of the plan and was able to repeat key elements of the plan. All questions were answered to their satisfaction.  [name], FNP   I, [name], FNP, have reviewed all documentation for this visit. The documentation on 10/09/19 for the exam, diagnosis, procedures, and orders are all accurate and complete.  THE PATIENT IS ENCOURAGED TO PRACTICE SOCIAL DISTANCING DUE TO THE COVID-19 PANDEMIC.

## 2019-10-09 ENCOUNTER — Other Ambulatory Visit: Payer: Self-pay

## 2019-10-09 ENCOUNTER — Ambulatory Visit (INDEPENDENT_AMBULATORY_CARE_PROVIDER_SITE_OTHER): Payer: Medicare Other | Admitting: Nurse Practitioner

## 2019-10-09 ENCOUNTER — Encounter: Payer: Self-pay | Admitting: Nurse Practitioner

## 2019-10-09 VITALS — BP 130/86 | HR 93 | Temp 98.0°F | Ht 64.2 in | Wt 296.6 lb

## 2019-10-09 DIAGNOSIS — I110 Hypertensive heart disease with heart failure: Secondary | ICD-10-CM | POA: Diagnosis not present

## 2019-10-09 DIAGNOSIS — Z23 Encounter for immunization: Secondary | ICD-10-CM

## 2019-10-09 DIAGNOSIS — R7303 Prediabetes: Secondary | ICD-10-CM | POA: Diagnosis not present

## 2019-10-09 DIAGNOSIS — R2689 Other abnormalities of gait and mobility: Secondary | ICD-10-CM

## 2019-10-09 DIAGNOSIS — E782 Mixed hyperlipidemia: Secondary | ICD-10-CM

## 2019-10-09 DIAGNOSIS — I10 Essential (primary) hypertension: Secondary | ICD-10-CM | POA: Insufficient documentation

## 2019-10-09 MED ORDER — PNEUMOCOCCAL 13-VAL CONJ VACC IM SUSP: 0.5000 mL | INTRAMUSCULAR | 0 refills | Status: AC

## 2019-10-09 MED ORDER — TETANUS-DIPHTH-ACELL PERTUSSIS 5-2.5-18.5 LF-MCG/0.5 IM SUSP: 0.5000 mL | Freq: Once | INTRAMUSCULAR | 0 refills | Status: AC

## 2019-10-10 LAB — CMP14+EGFR
ALT: 14 IU/L (ref 0–32)
AST: 18 IU/L (ref 0–40)
Albumin/Globulin Ratio: 1.4 (ref 1.2–2.2)
Albumin: 4.4 g/dL (ref 3.8–4.8)
Alkaline Phosphatase: 200 IU/L — ABNORMAL HIGH (ref 48–121)
BUN/Creatinine Ratio: 11 — ABNORMAL LOW (ref 12–28)
BUN: 10 mg/dL (ref 8–27)
Bilirubin Total: 0.4 mg/dL (ref 0.0–1.2)
CO2: 25 mmol/L (ref 20–29)
Calcium: 9.8 mg/dL (ref 8.7–10.3)
Chloride: 106 mmol/L (ref 96–106)
Creatinine, Ser: 0.91 mg/dL (ref 0.57–1.00)
GFR calc Af Amer: 76 mL/min/{1.73_m2} (ref >59)
GFR calc non Af Amer: 66 mL/min/{1.73_m2} (ref >59)
Globulin, Total: 3.1 g/dL (ref 1.5–4.5)
Glucose: 107 mg/dL — ABNORMAL HIGH (ref 65–99)
Potassium: 4.7 mmol/L (ref 3.5–5.2)
Sodium: 142 mmol/L (ref 134–144)
Total Protein: 7.5 g/dL (ref 6.0–8.5)

## 2019-10-10 LAB — HEMOGLOBIN A1C
Est. average glucose Bld gHb Est-mCnc: 128 mg/dL
Hgb A1c MFr Bld: 6.1 % — ABNORMAL HIGH (ref 4.8–5.6)

## 2019-10-10 LAB — LIPID PANEL
Chol/HDL Ratio: 3 ratio (ref 0.0–4.4)
Cholesterol, Total: 139 mg/dL (ref 100–199)
HDL: 47 mg/dL (ref >39)
LDL Chol Calc (NIH): 79 mg/dL (ref 0–99)
Triglycerides: 66 mg/dL (ref 0–149)
VLDL Cholesterol Cal: 13 mg/dL (ref 5–40)

## 2019-10-10 LAB — VITAMIN B12: Vitamin B-12: 922 pg/mL (ref 232–1245)

## 2019-10-20 ENCOUNTER — Telehealth: Payer: Self-pay

## 2019-10-20 ENCOUNTER — Encounter: Payer: Self-pay | Admitting: Nurse Practitioner

## 2019-10-20 NOTE — Telephone Encounter (Signed)
I'm having severe ankle and feet edema from Norvasc which I take q hs. (10mg )  I have been wearing my TED hose but it help some. I would like to start taking the med qod  until I can stop .I  have been taking it for a long period of time. Or can I be placed on another med with less side effects  Per JM: What makes pt think its the Norvasc? Per pt she has been on the medication for a long time, this is not the first time this has occurred, she goes to the bathroom enough, and she is losing weight. Per JM: Pt should take 1/2 and follow up in 4 wks   Pt is ok with that, 4wk appt scheduled

## 2019-10-30 ENCOUNTER — Telehealth: Payer: Self-pay

## 2019-10-30 NOTE — Telephone Encounter (Addendum)
  Chronic Care Management   Outreach Note  10/30/2019 Name: Brandi [name] MRN: [id number] DOB: 04/14/1955  Referred by: [name], FNP Reason for referral : No chief complaint on file.   An unsuccessful telephone outreach was attempted today. The patient was referred to the case management team for assistance with care management and care coordination.   Follow Up Plan: A HIPPA compliant phone message was left for the patient providing contact information and requesting a return call.  Telephone follow up appointment with care management team member scheduled for: 12/05/19  Barb Merino, RN, BSN, CCM Care Management Coordinator [place] [facility]  Direct Phone: [phone]

## 2019-11-10 ENCOUNTER — Other Ambulatory Visit: Payer: Self-pay | Admitting: Nurse Practitioner

## 2019-11-11 NOTE — Telephone Encounter (Signed)
Flexeril and Gabapentin refill request

## 2019-11-17 ENCOUNTER — Ambulatory Visit (INDEPENDENT_AMBULATORY_CARE_PROVIDER_SITE_OTHER): Payer: Medicare Other | Admitting: Nurse Practitioner

## 2019-11-17 ENCOUNTER — Other Ambulatory Visit: Payer: Self-pay

## 2019-11-17 ENCOUNTER — Encounter: Payer: Self-pay | Admitting: Nurse Practitioner

## 2019-11-17 VITALS — BP 124/80 | HR 76 | Temp 98.3°F | Ht 62.6 in | Wt 294.6 lb

## 2019-11-17 DIAGNOSIS — R011 Cardiac murmur, unspecified: Secondary | ICD-10-CM

## 2019-11-17 DIAGNOSIS — R42 Dizziness and giddiness: Secondary | ICD-10-CM

## 2019-11-17 DIAGNOSIS — I10 Essential (primary) hypertension: Secondary | ICD-10-CM

## 2019-11-17 DIAGNOSIS — M7989 Other specified soft tissue disorders: Secondary | ICD-10-CM

## 2019-11-17 DIAGNOSIS — R2689 Other abnormalities of gait and mobility: Secondary | ICD-10-CM | POA: Diagnosis not present

## 2019-11-17 MED ORDER — AMLODIPINE BESYLATE 5 MG PO TABS: 5.0000 mg | ORAL_TABLET | Freq: Every day | ORAL | 1 refills | Status: DC

## 2019-11-17 NOTE — Progress Notes (Signed)
This visit occurred during the SARS-CoV-2 public health emergency.  Safety protocols were in place, including screening questions prior to the visit, additional usage of staff PPE, and extensive cleaning of exam room while observing appropriate contact time as indicated for disinfecting solutions.  Subjective:     Patient ID: Brandi [name] , female    DOB: 1955/07/05 , 64 y.o.   MRN: [id number]   Chief Complaint  Patient presents with  . Edema    patient stated the swelling has gotten better but she stated she has been more unbalanced  . Migraine    patient stated she has been taking the topamax that was prescribed by her nuerologist     HPI  She is having problems with being off balanced.  She is now wearing her TED hose most of the day.  She will awaken twice a night to go to the bathroom.   She tells me she is also starting to get headaches again.    She tells me she never had headaches until after having a fall at work.  She is not using a CPAP machine.   She has had physical therapy for her balance in the last 2 years.  Sometimes she feels like she steps wrong and needs to slow down.      Past Medical History:  Diagnosis Date  . Arthritis   . Hypertension   . Hypertensive crisis   . Osteoporosis   . Rheumatoid arthritis ([place])   . Sleep apnea      Family History  Problem Relation Age of Onset  . Hypertension Mother   . Cancer Mother   . Heart disease Mother   . Kidney disease Father   . Heart disease Father   . Diabetes Father   . Hypertension Sister      Current Outpatient Medications:  .  amLODipine (NORVASC) 5 MG tablet, Take 1 tablet (5 mg total) by mouth daily., Disp: 90 tablet, Rfl: 1 .  atorvastatin (LIPITOR) 40 MG tablet, Take one tablet by mouth once daily, Disp: 90 tablet, Rfl: 3 .  BYSTOLIC 20 MG TABS, Take 1 tablet by mouth once daily, Disp: 90 tablet, Rfl: 3 .  cetirizine (ZYRTEC) 10 MG tablet, Take 10 mg by mouth daily., Disp: , Rfl:  .   Cholecalciferol (VITAMIN D) 50 MCG (2000 UT) CAPS, Take 1 capsule by mouth daily., Disp: , Rfl:  .  cyclobenzaprine (FLEXERIL) 10 MG tablet, TAKE 1 TABLET(10 MG) BY MOUTH THREE TIMES DAILY, Disp: 270 tablet, Rfl: 1 .  fluticasone (FLONASE ALLERGY RELIEF) 50 MCG/ACT nasal spray, Flonase Allergy Relief 50 mcg/actuation nasal spray,suspension  INSTILL 2 SPRAYS IN EACH NOSTRIL QD, Disp: , Rfl:  .  gabapentin (NEURONTIN) 300 MG capsule, TAKE 1 CAPSULE(300 MG) BY MOUTH DAILY, Disp: 90 capsule, Rfl: 0 .  hydrocortisone cream 0.5 %, 1 application as needed. , Disp: , Rfl:  .  Magnesium 500 MG TABS, Take 1 tablet by mouth daily., Disp: , Rfl:  .  PRESCRIPTION MEDICATION, STRENGTIA 30MG  BID, Disp: , Rfl:  .  PRESCRIPTION MEDICATION, HEPATO-CI BID, Disp: , Rfl:  .  PRESCRIPTION MEDICATION, OMEGA CO3-SE BID, Disp: , Rfl:  .  PRESCRIPTION MEDICATION, b12 GD ONE DROPPER, Disp: , Rfl:  .  PRESCRIPTION MEDICATION, GLYSEN BID, Disp: , Rfl:  .  rOPINIRole (REQUIP) 1 MG tablet, Take 1 mg by mouth 2 (two) times daily. , Disp: , Rfl:  .  topiramate (TOPAMAX) 25 MG tablet, TAKE 1 TABLET(25 MG) BY  MOUTH DAILY, Disp: 90 tablet, Rfl: 1 .  TRIAMCINOLONE ACETONIDE, TOP, 0.05 % OINT, Apply 1 application topically as needed., Disp: , Rfl:  .  Vitamin E 180 MG CAPS, Take 1 capsule by mouth daily., Disp: , Rfl:    Allergies  Allergen Reactions  . Hydralazine Itching  . Meperidine Hcl Other (See Comments)  . Aliskiren Hives  . Aspirin Nausea Only  . Latex     hives  . Oxycodone-Acetaminophen Itching  . Rofecoxib Diarrhea  . Spironolactone Other (See Comments)  . Furosemide Other (See Comments)  . Sulfa Antibiotics Rash     Review of Systems  Constitutional: Negative.   Respiratory: Negative.   Cardiovascular: Positive for leg swelling (bilateral). Negative for chest pain and palpitations.  Neurological: Positive for dizziness (intermittent) and headaches (history of headaches).  Psychiatric/Behavioral: Negative.       Today's Vitals   11/17/19 1025  BP: 124/80  Pulse: 76  Temp: 98.3 F (36.8 C)  TempSrc: Oral  Weight: 294 lb 9.6 oz (133.6 kg)  Height: 5' 2.6" (1.59 m)  PainSc: 0-No pain   Body mass index is 52.86 kg/m.   Objective:  Physical Exam Constitutional:      General: She is not in acute distress.    Appearance: Normal appearance.  Cardiovascular:     Rate and Rhythm: Normal rate and regular rhythm.     Pulses: Normal pulses.     Heart sounds: Normal heart sounds. No murmur (2/6 murmur) heard.   Pulmonary:     Effort: Pulmonary effort is normal. No respiratory distress.     Breath sounds: Normal breath sounds.  Musculoskeletal:     Right lower leg: Edema (1+) present.     Left lower leg: Edema (1+) present.  Skin:    Capillary Refill: Capillary refill takes less than 2 seconds.  Neurological:     General: No focal deficit present.     Mental Status: She is alert and oriented to person, place, and time.     Cranial Nerves: No cranial nerve deficit.  Psychiatric:        Mood and Affect: Mood normal.        Behavior: Behavior normal.        Thought Content: Thought content normal.        Judgment: Judgment normal.         Assessment And Plan:     1. Imbalance  Will check CT of head to evaluate for structural damage  She is using a cane to ambulate - CT Head Wo Contrast; Future  2. Localized swelling of lower extremity  Swelling has improved with the lower dose of amlodipine  Encouraged to wear TED hose during daytime hours - amLODipine (NORVASC) 5 MG tablet; Take 1 tablet (5 mg total) by mouth daily.  Dispense: 90 tablet; Refill: 1  3. Essential hypertension  Chronic, well controlled  Tolerating the lower dose of amlodipine - amLODipine (NORVASC) 5 MG tablet; Take 1 tablet (5 mg total) by mouth daily.  Dispense: 90 tablet; Refill: 1 - Ambulatory referral to Cardiology  4. Dizziness  Intermittent dizziness due to the increased symptoms will check  CT of head - CT Head Wo Contrast; Future  5. Newly recognized heart murmur  Noted a new murmur 2/6, will refer to cardiology especially since she is having increasing swelling to her feet - Ambulatory referral to Cardiology     Patient was given opportunity to ask questions. Patient verbalized understanding of the plan  and was able to repeat key elements of the plan. All questions were answered to their satisfaction.  [name], FNP   I, [name], FNP, have reviewed all documentation for this visit. The documentation on 11/18/19 for the exam, diagnosis, procedures, and orders are all accurate and complete.   THE PATIENT IS ENCOURAGED TO PRACTICE SOCIAL DISTANCING DUE TO THE COVID-19 PANDEMIC.

## 2019-11-17 NOTE — Patient Instructions (Addendum)
·   Advised to use her CPAP regularly this may help her headaches.   Encouraged to limit water intake to 1500 ml based on a history of congestive heart failure.

## 2019-11-27 ENCOUNTER — Ambulatory Visit
Admission: RE | Admit: 2019-11-27 | Discharge: 2019-11-27 | Disposition: A | Discharge status: Home / Self Care | Payer: Medicare Other | Source: Ambulatory Visit | Attending: Nurse Practitioner | Admitting: Nurse Practitioner

## 2019-11-27 ENCOUNTER — Other Ambulatory Visit: Payer: Self-pay

## 2019-11-27 DIAGNOSIS — R2689 Other abnormalities of gait and mobility: Secondary | ICD-10-CM

## 2019-11-27 DIAGNOSIS — R42 Dizziness and giddiness: Secondary | ICD-10-CM

## 2019-12-05 ENCOUNTER — Telehealth: Payer: Medicare Other

## 2019-12-09 ENCOUNTER — Telehealth: Payer: Self-pay

## 2019-12-09 NOTE — Telephone Encounter (Signed)
I called pt and left her a v/m letting her know that her forms have been completed and faxed. YL,RMA

## 2020-01-04 NOTE — Progress Notes (Signed)
Cardiology Office Note:    Date:  01/09/2020   ID:  [name], DOB [birth year], MRN [id number]  PCP:  [name], FNP  Cardiologist:  Sinclair Grooms, MD   Referring MD: [name], FNP   Chief Complaint  Patient presents with  . Hypertension  . Congestive Heart Failure    History of Present Illness:    Brandi [name] is a 64 y.o. female with a hx of severe hypertension, hypertensive heart disease, congestive heart failure, obstructive sleep apnea, multiple medication intolerances, significant bilateral lower extremity edema, and morbid obesity.   She has lost weight over the last 6 months.  She has been compliant with her medications.  She doesn't feel bad.  She has seen the nutritionist concerning diabetes.  She endorses a diet that is less than 3000 mg of sodium per day.  She doesn't add salt.  She has difficulty ambulating.  She is not compliant with CPAP on a regular basis.  She uses no nonsteroidal anti-inflammatory therapy and drinks alcohol rarely.  She sleeps on 3 pillows.  She denies chest pain.  She has lower extremity swelling.  Past Medical History:  Diagnosis Date  . Arthritis   . Hypertension   . Hypertensive crisis   . Osteoporosis   . Rheumatoid arthritis ([place])   . Sleep apnea     Past Surgical History:  Procedure Laterality Date  . ABDOMINAL HYSTERECTOMY     PARTIAL  . CHOLECYSTECTOMY    . PROTESTIC HIPS    . PROTESTIC KNEE      Current Medications: Current Meds  Medication Sig  . amLODipine (NORVASC) 5 MG tablet Take 1 tablet (5 mg total) by mouth daily.  Marland Kitchen atorvastatin (LIPITOR) 40 MG tablet Take one tablet by mouth once daily  . Biotin 1000 MCG tablet Take 1,000 mcg by mouth daily.  Marland Kitchen BYSTOLIC 20 MG TABS Take 1 tablet by mouth once daily  . cetirizine (ZYRTEC) 10 MG tablet Take 10 mg by mouth daily.  . Cholecalciferol (VITAMIN D) 50 MCG (2000 UT) CAPS Take 1 capsule by mouth daily.  . cyclobenzaprine (FLEXERIL) 10 MG tablet TAKE 1  TABLET(10 MG) BY MOUTH THREE TIMES DAILY  . fluticasone (FLONASE ALLERGY RELIEF) 50 MCG/ACT nasal spray Flonase Allergy Relief 50 mcg/actuation nasal spray,suspension  INSTILL 2 SPRAYS IN EACH NOSTRIL QD  . gabapentin (NEURONTIN) 300 MG capsule TAKE 1 CAPSULE(300 MG) BY MOUTH DAILY  . hydrocortisone cream 0.5 % 1 application as needed.   . Magnesium 500 MG TABS Take 1 tablet by mouth daily.  Marland Kitchen omeprazole (PRILOSEC) 20 MG capsule Take 20 mg by mouth daily.  Marland Kitchen PRESCRIPTION MEDICATION STRENGTIA 30MG  BID  . PRESCRIPTION MEDICATION HEPATO-CI BID  . PRESCRIPTION MEDICATION OMEGA CO3-SE BID  . PRESCRIPTION MEDICATION b12 GD ONE DROPPER  . PRESCRIPTION MEDICATION GLYSEN BID  . rOPINIRole (REQUIP) 1 MG tablet Take 1 mg by mouth 2 (two) times daily.   Marland Kitchen topiramate (TOPAMAX) 25 MG tablet TAKE 1 TABLET(25 MG) BY MOUTH DAILY  . TRIAMCINOLONE ACETONIDE, TOP, 0.05 % OINT Apply 1 application topically as needed.  . Vitamin E 180 MG CAPS Take 1 capsule by mouth daily.     Allergies:   Hydralazine, Meperidine hcl, Aliskiren, Aspirin, Latex, Oxycodone-acetaminophen, Rofecoxib, Spironolactone, Furosemide, and Sulfa antibiotics   Social History   Socioeconomic History  . Marital status: Single    Spouse name: Not on file  . Number of children: Not on file  . Years of education: Not on  file  . Highest education level: Not on file  Occupational History  . Not on file  Tobacco Use  . Smoking status: Never Smoker  . Smokeless tobacco: Never Used  Substance and Sexual Activity  . Alcohol use: Not Currently  . Drug use: Not Currently  . Sexual activity: Not on file  Other Topics Concern  . Not on file  Social History Narrative  . Not on file   Social Determinants of Health   Financial Resource Strain:   . Difficulty of Paying Living Expenses: Not on file  Food Insecurity:   . Worried About Charity fundraiser in the Last Year: Not on file  . Ran Out of Food in the Last Year: Not on file    Transportation Needs:   . Lack of Transportation (Medical): Not on file  . Lack of Transportation (Non-Medical): Not on file  Physical Activity:   . Days of Exercise per Week: Not on file  . Minutes of Exercise per Session: Not on file  Stress:   . Feeling of Stress : Not on file  Social Connections:   . Frequency of Communication with Friends and Family: Not on file  . Frequency of Social Gatherings with Friends and Family: Not on file  . Attends Religious Services: Not on file  . Active Member of Clubs or Organizations: Not on file  . Attends Archivist Meetings: Not on file  . Marital Status: Not on file     Family History: The patient's family history includes Cancer in her mother; Diabetes in her father; Heart disease in her father and mother; Hypertension in her mother and sister; Kidney disease in her father.  ROS:   Please see the history of present illness.    Sciatic pain.  Numbness and tingling in her feet.  Says she is not on a diuretic because each time she gets started on 1 she has side effects or blood work abnormalities.  All other systems reviewed and are negative.  EKGs/Labs/Other Studies Reviewed:    The following studies were reviewed today:  2D Doppler echocardiogram February 14, 2019: IMPRESSIONS    1. Left ventricular ejection fraction, by visual estimation, is 55 to  60%. The left ventricle has normal function. Left ventricular septal wall  thickness was mildly increased. Mildly increased left ventricular  posterior wall thickness.  2. Left ventricular diastolic parameters are consistent with Grade I  diastolic dysfunction (impaired relaxation).  3. Definity contrast agent was given IV to delineate the left ventricular  endocardial borders.  4. Global right ventricle was not well visualized.The right ventricular  size is not well visualized. Right vetricular wall thickness was not  assessed.  5. Left atrial size was not well  visualized.  6. Right atrial size was not well visualized.  7. The mitral valve was not well visualized. No evidence of mitral valve  regurgitation. No evidence of mitral stenosis.  8. The tricuspid valve is not well visualized. Tricuspid valve  regurgitation is not demonstrated.  9. The aortic valve was not well visualized. Aortic valve regurgitation  is not visualized.  10. The pulmonic valve was not well visualized. Pulmonic valve  regurgitation is not visualized.  11. The inferior vena cava is normal in size with greater than 50%  respiratory variability, suggesting right atrial pressure of 3 mmHg.    EKG:  EKG normal sinus rhythm, normal appearance.  Compared to prior from 2020, no changes noted.  Recent Labs: 01/27/2019: Hemoglobin 12.4;  NT-Pro BNP 13; Platelets 275 10/09/2019: ALT 14; BUN 10; Creatinine, Ser 0.91; Potassium 4.7; Sodium 142  Recent Lipid Panel    Component Value Date/Time   CHOL 139 10/09/2019 1107   TRIG 66 10/09/2019 1107   HDL 47 10/09/2019 1107   CHOLHDL 3.0 10/09/2019 1107   LDLCALC 79 10/09/2019 1107    Physical Exam:    VS:  BP (!) 181/108   Pulse 83   Ht 5' 2.6" (1.59 m)   Wt 300 lb (136.1 kg)   SpO2 93%   BMI 53.82 kg/m     Wt Readings from Last 3 Encounters:  01/09/20 300 lb (136.1 kg)  11/17/19 294 lb 9.6 oz (133.6 kg)  10/09/19 296 lb 9.6 oz (134.5 kg)     GEN: Morbidly obese.. No acute distress HEENT: Normal NECK: No JVD. LYMPHATICS: No lymphadenopathy CARDIAC:  RRR without murmur, gallop, or edema. VASCULAR:  Normal Pulses. No bruits. RESPIRATORY:  Clear to auscultation without rales, wheezing or rhonchi  ABDOMEN: Soft, non-tender, non-distended, No pulsatile mass, MUSCULOSKELETAL: No deformity  SKIN: Warm and dry NEUROLOGIC:  Alert and oriented x 3 PSYCHIATRIC:  Normal affect   ASSESSMENT:    1. Hypertensive heart disease with heart failure ([place])   2. Obstructive sleep apnea   3. Morbid obesity ([place])   4.  Prediabetes   5. Educated about COVID-19 virus infection    PLAN:    In order of problems listed above:  1. We'll add spironolactone 12.5 mg daily.  Blood work in 7 to 10 days.  Continue Norvasc and diastolic.  Blood pressure check in 1 month.  Low-salt diet, weight loss, avoidance of nonsteroidal anti-inflammatory agents, ice with CPAP all stressed. 2. Compliance with CPAP 3. Decrease caloric intake and physical activity is encouraged. 4. Last hemoglobin A1c was 6.1.  If we continue to have difficulty with blood pressure, consider adding an SGLT2 which will further lower her blood pressure without concern for hypoglycemia. 5. Vaccinated and has not had infection with COVID-19.  Hypertension is a terrible problem in this patient.  She feels okay but has very poorly controlled pressure.  We will start Aldactone low-dose, check laboratory data, and progress from there.  Will likely need to have an ARB added.  May need a double diuretic regimen which may include chlorthalidone.  If I have further difficulty, will refer her to the [facility] hypertension clinic, Dr. Oval Linsey.   Medication Adjustments/Labs and Tests Ordered: Current medicines are reviewed at length with the patient today.  Concerns regarding medicines are outlined above.  Orders Placed This Encounter  Procedures  . EKG 12-Lead   No orders of the defined types were placed in this encounter.   There are no Patient Instructions on file for this visit.   Signed, Sinclair Grooms, MD  01/09/2020 9:22 AM    [facility]

## 2020-01-07 ENCOUNTER — Ambulatory Visit (INDEPENDENT_AMBULATORY_CARE_PROVIDER_SITE_OTHER): Payer: Medicare Other | Admitting: Podiatry

## 2020-01-07 ENCOUNTER — Other Ambulatory Visit: Payer: Self-pay

## 2020-01-07 DIAGNOSIS — L600 Ingrowing nail: Secondary | ICD-10-CM

## 2020-01-07 NOTE — Progress Notes (Signed)
   HPI: 64 y.o. female presenting today for evaluation of an ingrown toenail that developed to the right second toe.  Patient states that she received a pedicure approximately 1 month ago and she noticed some tenderness and swelling in the area.  She has been doctoring it herself at the moment.  She has been applying Neosporin and cleaning it well.  She believes it is healed but she would like to have a checked out today.  Past Medical History:  Diagnosis Date  . Arthritis   . Hypertension   . Hypertensive crisis   . Osteoporosis   . Rheumatoid arthritis ([place])   . Sleep apnea      Physical Exam: General: The patient is alert and oriented x3 in no acute distress.  Dermatology: Skin is warm, dry and supple bilateral lower extremities. Negative for open lesions or macerations.  There are some dry sanguinous crust to the medial border of the right second toe.  No evidence of an ingrowing nail.  No cellulitis or infection.  No tenderness either.  Vascular: Palpable pedal pulses bilaterally. No edema or erythema noted. Capillary refill within normal limits.  Neurological: Epicritic and protective threshold grossly intact bilaterally.   Musculoskeletal Exam: Range of motion within normal limits to all pedal and ankle joints bilateral. Muscle strength 5/5 in all groups bilateral.    Assessment: 1.  Ingrown toenail right second toe-resolved   Plan of Care:  1. Patient evaluated.  2.  Light debridement of the area was performed to ensure that there is no purulent drainage or evidence of infection 3.  Explained the patient and reassured her that the ingrown is resolved.  It is no longer tender or symptomatic. 4.  Return to clinic as needed      Edrick Kins, DPM [facility]  Dr. Edrick Kins, DPM    [address], [place] [address]                Office [phone]  Fax [phone]

## 2020-01-09 ENCOUNTER — Other Ambulatory Visit: Payer: Self-pay

## 2020-01-09 ENCOUNTER — Ambulatory Visit (INDEPENDENT_AMBULATORY_CARE_PROVIDER_SITE_OTHER): Payer: Medicare Other | Admitting: Interventional Cardiology

## 2020-01-09 ENCOUNTER — Encounter: Payer: Self-pay | Admitting: Interventional Cardiology

## 2020-01-09 VITALS — BP 181/108 | HR 83 | Ht 62.6 in | Wt 300.0 lb

## 2020-01-09 DIAGNOSIS — I110 Hypertensive heart disease with heart failure: Secondary | ICD-10-CM | POA: Diagnosis not present

## 2020-01-09 DIAGNOSIS — R7303 Prediabetes: Secondary | ICD-10-CM | POA: Diagnosis not present

## 2020-01-09 DIAGNOSIS — Z7189 Other specified counseling: Secondary | ICD-10-CM

## 2020-01-09 DIAGNOSIS — G4733 Obstructive sleep apnea (adult) (pediatric): Secondary | ICD-10-CM | POA: Diagnosis not present

## 2020-01-09 MED ORDER — SPIRONOLACTONE 25 MG PO TABS: 12.5000 mg | ORAL_TABLET | Freq: Every day | ORAL | 3 refills | Status: DC

## 2020-01-09 MED ORDER — SPIRONOLACTONE 25 MG PO TABS
12.5000 mg | ORAL_TABLET | Freq: Every day | ORAL | 3 refills | Status: DC
Start: 2020-01-09 — End: 2020-03-09

## 2020-01-09 NOTE — Addendum Note (Signed)
Addended by: Loren Racer on: 01/09/2020 09:49 AM   Modules accepted: Orders

## 2020-01-09 NOTE — Patient Instructions (Signed)
Medication Instructions:  1) START Spironolactone 12.5mg  once daily  *If you need a refill on your cardiac medications before your next appointment, please call your pharmacy*   Lab Work: BMET in 7-10 days  If you have labs (blood work) drawn today and your tests are completely normal, you will receive your results only by: Marland Kitchen MyChart Message (if you have MyChart) OR . A paper copy in the mail If you have any lab test that is abnormal or we need to change your treatment, we will call you to review the results.   Testing/Procedures: None   Follow-Up:  Your physician recommends that you schedule a follow-up appointment in: 1 month with our Hypertension Clinic.   At [facility], you and your health needs are our priority.  As part of our continuing mission to provide you with exceptional heart care, we have created designated Provider Care Teams.  These Care Teams include your primary Cardiologist (physician) and Advanced Practice Providers (APPs -  Physician Assistants and Nurse Practitioners) who all work together to provide you with the care you need, when you need it.  We recommend signing up for the patient portal called "MyChart".  Sign up information is provided on this After Visit Summary.  MyChart is used to connect with patients for Virtual Visits (Telemedicine).  Patients are able to view lab/test results, encounter notes, upcoming appointments, etc.  Non-urgent messages can be sent to your provider as well.   To learn more about what you can do with MyChart, go to [email or web address].    Your next appointment:   12 month(s)  The format for your next appointment:   In Person  Provider:   You may see Sinclair Grooms, MD or one of the following Advanced Practice Providers on your designated Care Team:    Truitt Merle, NP  Cecilie Kicks, NP  Kathyrn Drown, NP    Other Instructions  Your physician recommends that you be compliant with your CPAP machine.

## 2020-01-15 ENCOUNTER — Ambulatory Visit: Payer: Medicare Other

## 2020-01-15 DIAGNOSIS — I10 Essential (primary) hypertension: Secondary | ICD-10-CM

## 2020-01-15 DIAGNOSIS — R7303 Prediabetes: Secondary | ICD-10-CM

## 2020-01-15 NOTE — Patient Instructions (Signed)
Social Worker Visit Information  Goals we discussed today:  Goals Addressed            This Visit's Progress   . Collaborate with RN Care Manager to perform appropriate assessment to assist with care coordination needs       CARE PLAN ENTRY (see longitudinal plan of care for additional care plan information)  Current Barriers:  . Chronic disease management support and education needs related to HTN, Sleep Apnea, Polypharmacy, and Prediabetes   Social Work Clinical Goal(s):  Marland Kitchen Over the next 120 days the patient will work with SW to identify and address and acute and/or chronic care coordination needs related to the self health management of HTN, sleep apnea, polypharmacy, and prediabetes.  CCM SW Interventions: Completed 01/15/20 . Inter-disciplinary care team collaboration (see longitudinal plan of care) . Successful outbound call placed to the patient to complete an SDoH screen; no acute challenges noted at this time . Discussed long term follow up with SW while the patient remains actively engaged with RN Care Manager to address care management needs . Scheduled follow up call over the next 90 days  Patient Self Care Activities:  . Patient verbalizes understanding of plan to contact SW as needed prior to next scheduled call . Self administers medications as prescribed . Attends all scheduled provider appointments . Calls pharmacy for medication refills . Performs ADL's independently  Initial goal documentation         Follow Up Plan: SW will follow up with patient by phone over the next 90 days.   Daneen Schick, BSW, CDP Social Worker, Certified Dementia Practitioner [place] [facility] [place] [facility] [phone]

## 2020-01-15 NOTE — Chronic Care Management (AMB) (Signed)
Chronic Care Management   Long Term BSW Follow Up Note  01/15/2020 Name: Brandi [name] MRN: [id number] DOB: [birth year]  Previously referred by PCP, [name] FNP for Disease Management and Care Coordination.  Today, I spoke with patient by phone for the purpose of ongoing follow up and assessment of chronic disease management and care coordination needs.   Assessment: Patient continues to be engaged with [facility] for assistance with disease management of HTN, Sleep apnea, and prediabetes. Patient has no acute care coordination needs at this time.  Review of patient status, including review of consultants reports, relevant laboratory and other test results, and collaboration with appropriate care team members and the patient's provider was performed as part of comprehensive patient evaluation and provision of chronic care management services.    Outpatient Encounter Medications as of 01/15/2020  Medication Sig  . amLODipine (NORVASC) 5 MG tablet Take 1 tablet (5 mg total) by mouth daily.  Marland Kitchen atorvastatin (LIPITOR) 40 MG tablet Take one tablet by mouth once daily  . Biotin 1000 MCG tablet Take 1,000 mcg by mouth daily.  Marland Kitchen BYSTOLIC 20 MG TABS Take 1 tablet by mouth once daily  . cetirizine (ZYRTEC) 10 MG tablet Take 10 mg by mouth daily.  . Cholecalciferol (VITAMIN D) 50 MCG (2000 UT) CAPS Take 1 capsule by mouth daily.  . cyclobenzaprine (FLEXERIL) 10 MG tablet TAKE 1 TABLET(10 MG) BY MOUTH THREE TIMES DAILY  . fluticasone (FLONASE ALLERGY RELIEF) 50 MCG/ACT nasal spray Flonase Allergy Relief 50 mcg/actuation nasal spray,suspension  INSTILL 2 SPRAYS IN EACH NOSTRIL QD  . gabapentin (NEURONTIN) 300 MG capsule TAKE 1 CAPSULE(300 MG) BY MOUTH DAILY  . hydrocortisone cream 0.5 % 1 application as needed.   . Magnesium 500 MG TABS Take 1 tablet by mouth daily.  Marland Kitchen omeprazole (PRILOSEC) 20 MG capsule Take 20 mg by mouth daily.  Marland Kitchen PRESCRIPTION MEDICATION STRENGTIA 30MG  BID  . PRESCRIPTION  MEDICATION HEPATO-CI BID  . PRESCRIPTION MEDICATION OMEGA CO3-SE BID  . PRESCRIPTION MEDICATION b12 GD ONE DROPPER  . PRESCRIPTION MEDICATION GLYSEN BID  . rOPINIRole (REQUIP) 1 MG tablet Take 1 mg by mouth 2 (two) times daily.   Marland Kitchen spironolactone (ALDACTONE) 25 MG tablet Take 0.5 tablets (12.5 mg total) by mouth daily.  Marland Kitchen topiramate (TOPAMAX) 25 MG tablet TAKE 1 TABLET(25 MG) BY MOUTH DAILY  . TRIAMCINOLONE ACETONIDE, TOP, 0.05 % OINT Apply 1 application topically as needed.  . Vitamin E 180 MG CAPS Take 1 capsule by mouth daily.   No facility-administered encounter medications on file as of 01/15/2020.    Goals Addressed            This Visit's Progress   . Collaborate with RN Care Manager to perform appropriate assessment to assist with care coordination needs       CARE PLAN ENTRY (see longitudinal plan of care for additional care plan information)  Current Barriers:  . Chronic disease management support and education needs related to HTN, Sleep Apnea, Polypharmacy, and Prediabetes   Social Work Clinical Goal(s):  Marland Kitchen Over the next 120 days the patient will work with SW to identify and address and acute and/or chronic care coordination needs related to the self health management of HTN, sleep apnea, polypharmacy, and prediabetes.  CCM SW Interventions: Completed 01/15/20 . Inter-disciplinary care team collaboration (see longitudinal plan of care) . Successful outbound call placed to the patient to complete an SDoH screen; no acute challenges noted at this time . Discussed long term  follow up with SW while the patient remains actively engaged with RN Care Manager to address care management needs . Scheduled follow up call over the next 90 days  Patient Self Care Activities:  . Patient verbalizes understanding of plan to contact SW as needed prior to next scheduled call . Self administers medications as prescribed . Attends all scheduled provider appointments . Calls pharmacy  for medication refills . Performs ADL's independently  Initial goal documentation        Changes in SDOH (Social Determinants of Health) since last assessment: None  Does patient express difficulty paying for or obtaining medications: No  Date of last attended PCP appointment:  11/17/19  Date of last attended Annual Wellness Visit: 03/11/19  Date of last specialist appointments: 01/09/20  Care Gaps Noted: none  Does patient exhibit evidence of need for re-establishment of more frequent engagement in CCM services?: No, patient remains engaged with [facility] with next appointment scheduled for 01/22/20.  Follow Up Plan: SW will follow up with the patient over the next 90 days.    Daneen Schick, BSW, CDP Social Worker, Certified Dementia Practitioner [place] [facility] [place] [facility] [phone]  Total time spent performing care coordination and/or care management activities with the patient by phone or face to face = 15 minutes.

## 2020-01-16 ENCOUNTER — Other Ambulatory Visit: Payer: Medicare Other | Admitting: *Deleted

## 2020-01-16 ENCOUNTER — Other Ambulatory Visit: Payer: Self-pay

## 2020-01-16 ENCOUNTER — Other Ambulatory Visit: Payer: Self-pay | Admitting: Nurse Practitioner

## 2020-01-16 DIAGNOSIS — J323 Chronic sphenoidal sinusitis: Secondary | ICD-10-CM

## 2020-01-16 DIAGNOSIS — I110 Hypertensive heart disease with heart failure: Secondary | ICD-10-CM

## 2020-01-16 LAB — BASIC METABOLIC PANEL
BUN/Creatinine Ratio: 12 (ref 12–28)
BUN: 10 mg/dL (ref 8–27)
CO2: 26 mmol/L (ref 20–29)
Calcium: 9.9 mg/dL (ref 8.7–10.3)
Chloride: 101 mmol/L (ref 96–106)
Creatinine, Ser: 0.81 mg/dL (ref 0.57–1.00)
GFR calc Af Amer: 88 mL/min/{1.73_m2} (ref >59)
GFR calc non Af Amer: 76 mL/min/{1.73_m2} (ref >59)
Glucose: 95 mg/dL (ref 65–99)
Potassium: 4.4 mmol/L (ref 3.5–5.2)
Sodium: 140 mmol/L (ref 134–144)

## 2020-01-16 MED ORDER — AMOXICILLIN 500 MG PO TABS: 500.0000 mg | ORAL_TABLET | Freq: Two times a day (BID) | ORAL | 0 refills | Status: DC

## 2020-01-20 ENCOUNTER — Telehealth: Payer: Self-pay

## 2020-01-20 ENCOUNTER — Ambulatory Visit: Payer: Medicare Other | Admitting: Nurse Practitioner

## 2020-01-20 ENCOUNTER — Other Ambulatory Visit: Payer: Self-pay

## 2020-01-20 NOTE — Telephone Encounter (Signed)
error 

## 2020-01-20 NOTE — Telephone Encounter (Signed)
Pt called to reschedule appt, called pt back LVM

## 2020-01-22 ENCOUNTER — Telehealth: Payer: Medicare Other

## 2020-02-10 ENCOUNTER — Ambulatory Visit: Payer: Medicare Other

## 2020-02-23 ENCOUNTER — Other Ambulatory Visit: Payer: Self-pay | Admitting: Nurse Practitioner

## 2020-02-23 NOTE — Progress Notes (Signed)
Patient ID: [name]                 DOB: [birth year]                      MRN: [id number]     HPI: Brandi [name] is a 64 y.o. female referred by Dr. Tamala Julian to HTN clinic. PMH is significant for severe hypertension, hypertensive heart disease, congestive heart failure (EF 55-60% in 2020), obstructive sleep apnea, multiple medication intolerances, significant bilateral lower extremity edema, and morbid obesity.  Pt with history of very poorly controlled blood pressure and last seen by Dr. Tamala Julian on 01/09/20 at which BP was elevated at 181/108 and spironolactone 12.5 mg daily was started. Of note, patient reported not previously being on a diuretic because each time she was started on one, she experienced side effects or blood work abnormalities.  Patient presents today in good spirits ambulating with cane. Reports medication adherence and no side effects with amlodipine and spironolactone. Reports swelling has significantly improved since adding spironolactone and reports headaches secondary to fall at work which is managed by a neurologist. Reports some unsteadiness and dizziness, and uses a cane or walker to help maintain her balance. Reports checking BP 3x/weekly (MWF) with most days SBP in 110s-130s. Denies SBP readings >140. Additionally, reports rash and extreme dehydration with Lasix.  Current HTN meds: amlodipine 5 mg daily (PM), spironolactone 12.5 mg daily (AM) Previously tried: hydralazine (itching), Aliskiren (hives), Lasix (rash and extreme dehydration) BP goal: <130/80 mmHg  Family History: Diabetes in her father; Heart disease in her father and mother; Hypertension in her mother and sister; Kidney disease in her father.  Social History: denies tobacco use; drinks alcohol rarely  Diet: endorses a diet that is less than 3000 mg of sodium per day.  She doesn't add salt. -Breakfast - grits, cereal, protein shake or smoothie with almond milk -Lunch/dinner - salad, meat vegetable, and a  starch; limiting bread and rice intake  -Snacks: nuts  -Drinks: water, lemon water, tea, ICEE lemonade/raspberry   Exercise: limited due to unsteady gait    Home BP readings: SBP 110-130s  Wt Readings from Last 3 Encounters:  01/09/20 300 lb (136.1 kg)  11/17/19 294 lb 9.6 oz (133.6 kg)  10/09/19 296 lb 9.6 oz (134.5 kg)   BP Readings from Last 3 Encounters:  02/24/20 136/80  01/09/20 (!) 181/108  11/17/19 124/80   Pulse Readings from Last 3 Encounters:  02/24/20 87  01/09/20 83  11/17/19 76    Renal function: CrCl cannot be calculated (Patient's most recent lab result is older than the maximum 21 days allowed.).  Past Medical History:  Diagnosis Date  . Arthritis   . Hypertension   . Hypertensive crisis   . Osteoporosis   . Rheumatoid arthritis ([place])   . Sleep apnea     Current Outpatient Medications on File Prior to Visit  Medication Sig Dispense Refill  . amLODipine (NORVASC) 5 MG tablet Take 1 tablet (5 mg total) by mouth daily. 90 tablet 1  . amoxicillin (AMOXIL) 500 MG tablet Take 1 tablet (500 mg total) by mouth 2 (two) times daily. 20 tablet 0  . atorvastatin (LIPITOR) 40 MG tablet Take one tablet by mouth once daily 90 tablet 3  . Biotin 1000 MCG tablet Take 1,000 mcg by mouth daily.    Marland Kitchen BYSTOLIC 20 MG TABS Take 1 tablet by mouth once daily 90 tablet 3  . cetirizine (ZYRTEC) 10 MG  tablet Take 10 mg by mouth daily.    . Cholecalciferol (VITAMIN D) 50 MCG (2000 UT) CAPS Take 1 capsule by mouth daily.    . cyclobenzaprine (FLEXERIL) 10 MG tablet TAKE 1 TABLET(10 MG) BY MOUTH THREE TIMES DAILY 270 tablet 1  . fluticasone (FLONASE ALLERGY RELIEF) 50 MCG/ACT nasal spray Flonase Allergy Relief 50 mcg/actuation nasal spray,suspension  INSTILL 2 SPRAYS IN EACH NOSTRIL QD    . gabapentin (NEURONTIN) 300 MG capsule TAKE 1 CAPSULE(300 MG) BY MOUTH DAILY 90 capsule 0  . hydrocortisone cream 0.5 % 1 application as needed.     . Magnesium 500 MG TABS Take 1 tablet by  mouth daily.    Marland Kitchen omeprazole (PRILOSEC) 20 MG capsule Take 20 mg by mouth daily.    Marland Kitchen PRESCRIPTION MEDICATION STRENGTIA 30MG  BID    . PRESCRIPTION MEDICATION HEPATO-CI BID    . PRESCRIPTION MEDICATION OMEGA CO3-SE BID    . PRESCRIPTION MEDICATION b12 GD ONE DROPPER    . PRESCRIPTION MEDICATION GLYSEN BID    . rOPINIRole (REQUIP) 1 MG tablet Take 1 mg by mouth 2 (two) times daily.     Marland Kitchen spironolactone (ALDACTONE) 25 MG tablet Take 0.5 tablets (12.5 mg total) by mouth daily. 45 tablet 3  . topiramate (TOPAMAX) 25 MG tablet TAKE 1 TABLET(25 MG) BY MOUTH DAILY 90 tablet 1  . TRIAMCINOLONE ACETONIDE, TOP, 0.05 % OINT Apply 1 application topically as needed.    . Vitamin E 180 MG CAPS Take 1 capsule by mouth daily.     No current facility-administered medications on file prior to visit.    Allergies  Allergen Reactions  . Hydralazine Itching  . Meperidine Hcl Other (See Comments)  . Aliskiren Hives  . Aspirin Nausea Only  . Latex     hives  . Oxycodone-Acetaminophen Itching  . Rofecoxib Diarrhea  . Spironolactone Other (See Comments)  . Furosemide Other (See Comments)    Rash and extreme dehydration  . Sulfa Antibiotics Rash     Assessment/Plan:  1. Hypertension - BP near goal <130/80 mmHg (due to size of arm, used left wrist for measurement). Medication adherence appears optimal. Patient did not bring BP log, however reports BP near goal at home. Counseled patient to check BP at least 1 hour after taking medications and bring log to next visit to accurately assess BP at home. Patient verbalized understanding. Will make no medication changes today and continue spironolactone 12.5 mg daily and amlodipine 5 mg daily. Will check BMET to assess kidney function and electrolytes as patient reports history of blood work abnormalities with diuretics. Follow-up visit scheduled in 2 weeks to assess home BP readings.  Lorel Monaco, PharmD, [facility] [address] [place], [place] [address] Phone: [phone]; Fax: [phone]

## 2020-02-23 NOTE — Telephone Encounter (Signed)
Gabapentin refill

## 2020-02-24 ENCOUNTER — Ambulatory Visit (INDEPENDENT_AMBULATORY_CARE_PROVIDER_SITE_OTHER): Payer: Medicare Other | Admitting: Pharmacist

## 2020-02-24 ENCOUNTER — Other Ambulatory Visit: Payer: Self-pay

## 2020-02-24 ENCOUNTER — Encounter: Payer: Self-pay | Admitting: Pharmacist

## 2020-02-24 VITALS — BP 136/80 | HR 87

## 2020-02-24 DIAGNOSIS — I10 Essential (primary) hypertension: Secondary | ICD-10-CM

## 2020-02-24 NOTE — Patient Instructions (Addendum)
It was nice to see you today!  Your goal blood pressure is <130/80 mmHg. In clinic, your blood pressure was 136/80 mmHg.  Medication Changes: Continue taking 0.5 tablet of Spironolactone 25 mg daily  Continue amlodipine 5 mg daily  Monitor blood pressure at home daily and keep a log (on your phone or piece of paper) to bring with you to your next visit. Write down date, time, blood pressure and pulse.  Keep up the good work with diet and exercise. Aim for a diet full of vegetables, fruit and lean meats (chicken, [place], fish). Try to limit salt intake by eating fresh or frozen vegetables (instead of canned), rinse canned vegetables prior to cooking and do not add any additional salt to meals.   See you in two weeks!  Please give [name] a call at [phone] with any questions or concerns.

## 2020-02-25 ENCOUNTER — Ambulatory Visit (INDEPENDENT_AMBULATORY_CARE_PROVIDER_SITE_OTHER): Payer: Medicare Other | Admitting: Neurology

## 2020-02-25 ENCOUNTER — Encounter: Payer: Self-pay | Admitting: Neurology

## 2020-02-25 VITALS — BP 170/97 | HR 87 | Ht 63.0 in | Wt 298.0 lb

## 2020-02-25 DIAGNOSIS — R519 Headache, unspecified: Secondary | ICD-10-CM | POA: Diagnosis not present

## 2020-02-25 LAB — BASIC METABOLIC PANEL
BUN/Creatinine Ratio: 11 — ABNORMAL LOW (ref 12–28)
BUN: 8 mg/dL (ref 8–27)
CO2: 22 mmol/L (ref 20–29)
Calcium: 10 mg/dL (ref 8.7–10.3)
Chloride: 109 mmol/L — ABNORMAL HIGH (ref 96–106)
Creatinine, Ser: 0.75 mg/dL (ref 0.57–1.00)
GFR calc Af Amer: 96 mL/min/{1.73_m2} (ref >59)
GFR calc non Af Amer: 83 mL/min/{1.73_m2} (ref >59)
Glucose: 95 mg/dL (ref 65–99)
Potassium: 4 mmol/L (ref 3.5–5.2)
Sodium: 145 mmol/L — ABNORMAL HIGH (ref 134–144)

## 2020-02-25 MED ORDER — TOPIRAMATE 50 MG PO TABS: 50.0000 mg | ORAL_TABLET | Freq: Two times a day (BID) | ORAL | 2 refills | Status: DC

## 2020-02-25 NOTE — Progress Notes (Signed)
[facility] [address] [place]. [place] [address] [phone]       OFFICE FOLLOW UP VISITNOTE  [name] Date of Birth:  [birth year] Medical Record Number:  [id number]   Referring MD: Brandi [name] Reason for Referral: Headaches HPI: Initial visit 09/26/2018 Brandi [name] is a 64 year old pleasant Caucasian lady who is seen today for initial consultation visit.  She is accompanied by her daughter.  History is obtained from her and review of referral notes.  I have reviewed imaging films in PACS.  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  She states she fell without warm warning face forward and sustained bruises on her forehead and face.  She states she did not lose consciousness but daughter of states that she may have briefly lost it.  Patient was seen in the ER in the hospital where apparently brain scan was unremarkable however I do not have those hospital records for my review today.  Since then she has had constant headaches, tremors of her hands, short-term memory difficulties.  She states her balance is poor.  She has been using a walker at times and she can use a cane also and she wants to.  She is also been dropping objects from her hand intermittently.  She also complains of some occasional word finding difficulties and states her speech is slow.  This symptoms have continued and have not gotten better.  She had a CT scan of the head ordered by Dr. Ronnald Ramp on 09/18/2018 which I personally reviewed shows no acute abnormality.  Patient states she was seeing a neurologist in [place] but I do not have those records either.  She does not remember having an MRI scan done.  She has also chronic back pain and sciatica and she takes Flexeril 10 mg 3 times daily, gabapentin 300 mg 3 times daily and tramadol as needed.  She also takes Requip 1 mg at night for restless legs.  Patient denies any significant increasing  headaches, focal extremity weakness, stroke, TIA, seizures. Update 12/04/2018 : She returns for follow-up after last visit 2 months ago.  She is accompanied by her daughter.  Patient states she is doing better.  She had only one fall recently when she felt dizzy and off-balance.  She uses a cane most of the time now and is careful when she gets up and turns.  She had MRI scan of the brain done on 10/31/2018 which was fairly unremarkable except for incidental empty sella.  EEG done on 10/09/2018 was normal.  Vitamin B12, TSH and RPR were normal on 09/26/2018.  Patient states Topamax that has started is helping hand pain and she is able to grip objects better she is not dropping them as much.  She feels her memory difficulties are unchanged.  She has however not been participating in activities for regular stress laxation.  She complains of dizziness but has been taking both Flexeril and gabapentin 3 times daily and she is willing to reduce them as she feels she does not need them as much now. Update 02/25/2020: She returns for follow-up after last visit more than a year ago.  She states memory and cognitive difficulties have improved.  She however still has almost daily headaches.  She describes headaches been fluctuating in severity from mild to severe but does not last long.  She takes 1 tablet of Tylenol which seems to work somewhat but the headache  returns.  She denies any nausea vomiting light or sound sensitivity.  She is able to work despite the headache and sleeps well at night.  She has been taking Topamax 25 mg once a day which she thinks has helped but she has not tried a higher dose.  She also takes gabapentin for sciatica but takes it only once a day as well as Requip 1 mg twice daily for restless legs which have not been bothering her recently.  She has no new complaints. ROS:   14 system review of systems is positive for headaches,,, gait and balance difficulties,  sciatica and all other systems  negative PMH:  Past Medical History:  Diagnosis Date  . Arthritis   . Hypertension   . Hypertensive crisis   . Osteoporosis   . Rheumatoid arthritis ([place])   . Sleep apnea     Social History:  Social History   Socioeconomic History  . Marital status: Single    Spouse name: Not on file  . Number of children: Not on file  . Years of education: Not on file  . Highest education level: Not on file  Occupational History  . Not on file  Tobacco Use  . Smoking status: Never Smoker  . Smokeless tobacco: Never Used  Substance and Sexual Activity  . Alcohol use: Not Currently  . Drug use: Not Currently  . Sexual activity: Not on file  Other Topics Concern  . Not on file  Social History Narrative   Lives alone   Right handed   Drinks no caffeine   Social Determinants of Health   Financial Resource Strain:   . Difficulty of Paying Living Expenses: Not on file  Food Insecurity: No Food Insecurity  . Worried About Charity fundraiser in the Last Year: Never true  . Ran Out of Food in the Last Year: Never true  Transportation Needs: No Transportation Needs  . Lack of Transportation (Medical): No  . Lack of Transportation (Non-Medical): No  Physical Activity:   . Days of Exercise per Week: Not on file  . Minutes of Exercise per Session: Not on file  Stress:   . Feeling of Stress : Not on file  Social Connections:   . Frequency of Communication with Friends and Family: Not on file  . Frequency of Social Gatherings with Friends and Family: Not on file  . Attends Religious Services: Not on file  . Active Member of Clubs or Organizations: Not on file  . Attends Archivist Meetings: Not on file  . Marital Status: Not on file  Intimate Partner Violence:   . Fear of Current or Ex-Partner: Not on file  . Emotionally Abused: Not on file  . Physically Abused: Not on file  . Sexually Abused: Not on file    Medications:   Current Outpatient Medications on File Prior to  Visit  Medication Sig Dispense Refill  . amLODipine (NORVASC) 5 MG tablet Take 1 tablet (5 mg total) by mouth daily. 90 tablet 1  . atorvastatin (LIPITOR) 40 MG tablet Take one tablet by mouth once daily 90 tablet 3  . Biotin 1000 MCG tablet Take 1,000 mcg by mouth daily.    Marland Kitchen BYSTOLIC 20 MG TABS Take 1 tablet by mouth once daily 90 tablet 3  . cetirizine (ZYRTEC) 10 MG tablet Take 10 mg by mouth daily.    . Cholecalciferol (VITAMIN D) 50 MCG (2000 UT) CAPS Take 1 capsule by mouth daily.    Marland Kitchen  cyclobenzaprine (FLEXERIL) 10 MG tablet TAKE 1 TABLET(10 MG) BY MOUTH THREE TIMES DAILY 270 tablet 1  . fluticasone (FLONASE ALLERGY RELIEF) 50 MCG/ACT nasal spray Flonase Allergy Relief 50 mcg/actuation nasal spray,suspension  INSTILL 2 SPRAYS IN EACH NOSTRIL QD    . hydrocortisone cream 0.5 % 1 application as needed.     . Magnesium 500 MG TABS Take 1 tablet by mouth daily.    Marland Kitchen omeprazole (PRILOSEC) 20 MG capsule Take 20 mg by mouth daily.    Marland Kitchen PRESCRIPTION MEDICATION STRENGTIA 30MG  BID    . PRESCRIPTION MEDICATION HEPATO-CI BID    . PRESCRIPTION MEDICATION OMEGA CO3-SE BID    . PRESCRIPTION MEDICATION b12 GD ONE DROPPER    . PRESCRIPTION MEDICATION GLYSEN BID    . spironolactone (ALDACTONE) 25 MG tablet Take 0.5 tablets (12.5 mg total) by mouth daily. 45 tablet 3  . TRIAMCINOLONE ACETONIDE, TOP, 0.05 % OINT Apply 1 application topically as needed.    . Vitamin E 180 MG CAPS Take 1 capsule by mouth daily.    Marland Kitchen amoxicillin (AMOXIL) 500 MG tablet Take 1 tablet (500 mg total) by mouth 2 (two) times daily. 20 tablet 0   No current facility-administered medications on file prior to visit.    Allergies:   Allergies  Allergen Reactions  . Hydralazine Itching  . Meperidine Hcl Other (See Comments)  . Aliskiren Hives  . Aspirin Nausea Only  . Latex     hives  . Oxycodone-Acetaminophen Itching  . Rofecoxib Diarrhea  . Spironolactone Other (See Comments)  . Furosemide Other (See Comments)    Rash  and extreme dehydration  . Sulfa Antibiotics Rash    Physical Exam General: Morbidly obese middle-aged Caucasian lady, seated, in no evident distress Head: head normocephalic and atraumatic.   Neck: supple with no carotid or supraclavicular bruits Cardiovascular: regular rate and rhythm, no murmurs Musculoskeletal: no deformity Skin:  no rash/petichiae Vascular:  Normal pulses all extremities  Neurologic Exam Mental Status: Awake and fully alert. Oriented to place and time. Recent and remote memory intact. Attention span, concentration and fund of knowledge appropriate. Mood and affect appropriate.  Diminished recall 2/3.  Mini-Mental status exam not done able to name 12 animals which walk on 4 legs.   Cranial Nerves: Fundoscopic exam not done pupils equal, briskly reactive to light. Extraocular movements full without nystagmus. Visual fields full to confrontation. Hearing intact. Facial sensation intact. Face, tongue, palate moves normally and symmetrically.  Motor: Normal bulk and tone. Normal strength in all tested extremity muscles. Sensory.: intact to touch , pinprick , position and vibratory sensation.  Coordination: Rapid alternating movements normal in all extremities. Finger-to-nose and heel-to-shin performed accurately bilaterally. Gait and Station: Arises from chair without difficulty.  Uses a cane.  Stance is normal. Gait demonstrates normal stride length and balance . Able to heel, toe and tandem walk with moderate difficulty.  Reflexes: 1+ and symmetric except ankle jerks are depressed bilaterally. Toes downgoing.       ASSESSMENT: 64 year old Caucasian lady with 2-year history of   postconcussive syndrome with chronic persistent daily headache    PLAN: I had a long discussion the patient regarding her chronic daily headaches which look like mixed postconcussion and tension headaches which have shown some response to Topamax.  I recommend we increase the dose of Topamax  to 50 mg at night for 1 week and increase if tolerated without side effects to twice daily.  Discontinue gabapentin and Requip as it does not seem  to be helping either her sciatica or restless legs.  She was advised to increase participation in regular activities for stress relaxation like meditation, yoga and regular exercises and pool therapy.  She will return for follow-up in the future in 3 months with my nurse practitioner Janett Billow or call earlier if necessary. Greater than 50% time during this 25-minute  visit was spent on counseling and coordination of care about her multifocal symptoms and discussion about postconcussive syndrome and mild cognitive impairment.   Antony Contras, MD  [facility] [address] [place], [place] [address]  Phone [phone] Fax [phone] Note: This document was prepared with digital dictation and possible smart phrase technology. Any transcriptional errors that result from this process are unintentional.

## 2020-02-25 NOTE — Patient Instructions (Signed)
I had a long discussion the patient regarding her chronic daily headaches which look like mixed postconcussion and tension headaches which have shown some response to Topamax.  I recommend we increase the dose of Topamax to 50 mg at night for 1 week and increase if tolerated without side effects to twice daily.  Discontinue gabapentin and Requip as it does not seem to be helping either her sciatica or restless legs.  She was advised to increase participation in regular activities for stress relaxation like meditation, yoga and regular exercises and pool therapy.  She will return for follow-up in the future in 3 months with my nurse practitioner Janett Billow or call earlier if necessary.

## 2020-03-06 NOTE — Progress Notes (Signed)
Patient ID: Brandi [name]                 DOB: June 05, 1955                      MRN: [id number]     HPI: Brandi [name] is a 64 y.o. female referred by Dr. Tamala Julian to HTN clinic. PMH is significant for severe hypertension, hypertensive heart disease, congestive heart failure (EF 55-60% in 2020), obstructive sleep apnea, multiple medication intolerances, significant bilateral lower extremity edema, and morbid obesity, prediabetes (A1c 6.1%). Last seen in HTN clinic on 02/24/20 at which BP 136/80 was near goal and no medication changes were made. On 02/25/20 at neurology appointment and BP was elevated at 170/97.  Patient presents today in good spirits ambulating with cane. Reports medication adherence with amlodipine and spironolactone daily. Denies dizziness and headaches since new medication changes made by neurologist for chronic headache management. Denies major changes in LE swelling (uses TED hosing) and blurred vision. Home BP readings range from 130-140s/70-90s with two highs of 152/93 and 150/99 and often checks BP after stressful activities such as walking, sweeping, and putting up [employer]. Uses arm cuff to measure BP.  Current HTN meds: amlodipine 5 mg daily (PM), spironolactone 12.5 mg daily (AM) Previously tried: hydralazine (itching), Aliskiren (hives), Lasix (rash and extreme dehydration) BP goal: <130/80 mmHg  Family History: Diabetes in her father; Heart disease in her father and mother; Hypertension in her mother and sister; Kidney disease in her father.  Social History: denies tobacco use; drinks alcohol rarely  Diet: endorses a diet that is less than 3000 mg of sodium per day. She doesn't add salt. -Breakfast - grits, cereal, protein shake or smoothie with almond milk -Lunch/dinner - salad, meat vegetable, and a starch; limiting bread and rice intake  -Snacks: nuts  -Drinks: water, lemon water, tea, ICEE lemonade/raspberry   Exercise: limited due to unsteady gait     Home BP readings: 12/1 - 12/14 BP: 144/90, 140/90, 152/93, 138/84, 112/78, 139/90, 144/88, 130/80, 142/88, 138/92, 130/90, 135/80, 137/82, 150/99 HR: 80-90s  Wt Readings from Last 3 Encounters:  02/25/20 298 lb (135.2 kg)  01/09/20 300 lb (136.1 kg)  11/17/19 294 lb 9.6 oz (133.6 kg)   BP Readings from Last 3 Encounters:  03/09/20 138/72  02/25/20 (!) 170/97  02/24/20 136/80   Pulse Readings from Last 3 Encounters:  03/09/20 84  02/25/20 87  02/24/20 87    Renal function: Estimated Creatinine Clearance: 93.4 mL/min (by C-G formula based on SCr of 0.75 mg/dL).  Past Medical History:  Diagnosis Date  . Arthritis   . Hypertension   . Hypertensive crisis   . Osteoporosis   . Rheumatoid arthritis ([place])   . Sleep apnea     Current Outpatient Medications on File Prior to Visit  Medication Sig Dispense Refill  . amLODipine (NORVASC) 5 MG tablet Take 1 tablet (5 mg total) by mouth daily. 90 tablet 1  . amoxicillin (AMOXIL) 500 MG tablet Take 1 tablet (500 mg total) by mouth 2 (two) times daily. 20 tablet 0  . atorvastatin (LIPITOR) 40 MG tablet Take one tablet by mouth once daily 90 tablet 3  . Biotin 1000 MCG tablet Take 1,000 mcg by mouth daily.    Marland Kitchen BYSTOLIC 20 MG TABS Take 1 tablet by mouth once daily 90 tablet 3  . cetirizine (ZYRTEC) 10 MG tablet Take 10 mg by mouth daily.    . Cholecalciferol (VITAMIN D)  50 MCG (2000 UT) CAPS Take 1 capsule by mouth daily.    . cyclobenzaprine (FLEXERIL) 10 MG tablet TAKE 1 TABLET(10 MG) BY MOUTH THREE TIMES DAILY 270 tablet 1  . fluticasone (FLONASE ALLERGY RELIEF) 50 MCG/ACT nasal spray Flonase Allergy Relief 50 mcg/actuation nasal spray,suspension  INSTILL 2 SPRAYS IN EACH NOSTRIL QD    . hydrocortisone cream 0.5 % 1 application as needed.     . Magnesium 500 MG TABS Take 1 tablet by mouth daily.    Marland Kitchen omeprazole (PRILOSEC) 20 MG capsule Take 20 mg by mouth daily.    Marland Kitchen PRESCRIPTION MEDICATION STRENGTIA 30MG  BID    . PRESCRIPTION  MEDICATION HEPATO-CI BID    . PRESCRIPTION MEDICATION OMEGA CO3-SE BID    . PRESCRIPTION MEDICATION b12 GD ONE DROPPER    . PRESCRIPTION MEDICATION GLYSEN BID    . spironolactone (ALDACTONE) 25 MG tablet Take 0.5 tablets (12.5 mg total) by mouth daily. 45 tablet 3  . topiramate (TOPAMAX) 50 MG tablet Take 1 tablet (50 mg total) by mouth 2 (two) times daily. Start 50 mg once daily at bedtime x 1 week and then twice daily 60 tablet 2  . TRIAMCINOLONE ACETONIDE, TOP, 0.05 % OINT Apply 1 application topically as needed.    . Vitamin E 180 MG CAPS Take 1 capsule by mouth daily.     No current facility-administered medications on file prior to visit.    Allergies  Allergen Reactions  . Hydralazine Itching  . Meperidine Hcl Other (See Comments)  . Aliskiren Hives  . Aspirin Nausea Only  . Latex     hives  . Oxycodone-Acetaminophen Itching  . Rofecoxib Diarrhea  . Spironolactone Other (See Comments)  . Furosemide Other (See Comments)    Rash and extreme dehydration  . Sulfa Antibiotics Rash     Assessment/Plan:  1. Hypertension - BP above goal <130/80 mmHg. Medication adherence appears optimal. Will increase spironolactone to 25 mg daily (1 tablet) and continue amlodipine 5 mg daily. Encouraged patient to check BP at least 1 hour after taking medications and 2 hours after physical exertion, and to bring log to next visit. Patient verbalized understanding. Discussed weight loss management and clinical benefits of Wegovy and patient is interested in losing weight. Will send referral to weight loss clinic. Also discussed [employer] class consisting of diet education and exercise training - patient is familiar with this class. She was interested in [place] but wanted to hold off and discuss with weight loss management. Follow-up visit and labs scheduled in 2 weeks for hypertension management.  Lorel Monaco, PharmD, [place] [facility] [address] [place], [place] [address] Phone: [phone]; Fax: [phone]

## 2020-03-09 ENCOUNTER — Other Ambulatory Visit: Payer: Self-pay

## 2020-03-09 ENCOUNTER — Encounter: Payer: Self-pay | Admitting: Pharmacist

## 2020-03-09 ENCOUNTER — Ambulatory Visit (INDEPENDENT_AMBULATORY_CARE_PROVIDER_SITE_OTHER): Payer: Medicare Other | Admitting: Pharmacist

## 2020-03-09 VITALS — BP 138/72 | HR 84

## 2020-03-09 DIAGNOSIS — I10 Essential (primary) hypertension: Secondary | ICD-10-CM | POA: Diagnosis not present

## 2020-03-09 MED ORDER — SPIRONOLACTONE 25 MG PO TABS: 25.0000 mg | ORAL_TABLET | Freq: Every day | ORAL | 3 refills | Status: DC

## 2020-03-09 MED ORDER — SPIRONOLACTONE 25 MG PO TABS: 25.0000 mg | ORAL_TABLET | Freq: Every day | ORAL | 11 refills | Status: DC

## 2020-03-09 NOTE — Patient Instructions (Addendum)
It was nice to see you today!  Your goal blood pressure is <130/80 mmHg. In clinic, your blood pressure was 138/72 mmHg.  Medication Changes: Start taking Spironolactone 25 mg daily   Continue amlodipine 5 mg daily  A representative from the weight loss clinic should give you a call in a couple of weeks to schedule an appointment  Monitor blood pressure at home daily and keep a log (on your phone or piece of paper) to bring with you to your next visit. Write down date, time, blood pressure and pulse.  Keep up the good work with diet and exercise. Aim for a diet full of vegetables, fruit and lean meats (chicken, [place], fish). Try to limit salt intake by eating fresh or frozen vegetables (instead of canned), rinse canned vegetables prior to cooking and do not add any additional salt to meals.   Please give [name] a call at [phone] with any questions or concerns.  See you in two weeks!  HAPPY HOLIDAYS!

## 2020-03-11 ENCOUNTER — Ambulatory Visit (INDEPENDENT_AMBULATORY_CARE_PROVIDER_SITE_OTHER): Payer: Medicare Other | Admitting: Nurse Practitioner

## 2020-03-11 ENCOUNTER — Encounter: Payer: Self-pay | Admitting: Nurse Practitioner

## 2020-03-11 ENCOUNTER — Ambulatory Visit (INDEPENDENT_AMBULATORY_CARE_PROVIDER_SITE_OTHER): Payer: Medicare Other

## 2020-03-11 ENCOUNTER — Other Ambulatory Visit: Payer: Self-pay

## 2020-03-11 VITALS — BP 138/98 | HR 96 | Temp 98.2°F | Ht 63.0 in | Wt 292.6 lb

## 2020-03-11 DIAGNOSIS — I110 Hypertensive heart disease with heart failure: Secondary | ICD-10-CM

## 2020-03-11 DIAGNOSIS — Z0000 Encounter for general adult medical examination without abnormal findings: Secondary | ICD-10-CM

## 2020-03-11 DIAGNOSIS — R7303 Prediabetes: Secondary | ICD-10-CM | POA: Diagnosis not present

## 2020-03-11 DIAGNOSIS — I5042 Chronic combined systolic (congestive) and diastolic (congestive) heart failure: Secondary | ICD-10-CM | POA: Diagnosis not present

## 2020-03-11 DIAGNOSIS — I10 Essential (primary) hypertension: Secondary | ICD-10-CM

## 2020-03-11 DIAGNOSIS — E782 Mixed hyperlipidemia: Secondary | ICD-10-CM

## 2020-03-11 DIAGNOSIS — Z23 Encounter for immunization: Secondary | ICD-10-CM

## 2020-03-11 DIAGNOSIS — Z6841 Body Mass Index (BMI) 40.0 and over, adult: Secondary | ICD-10-CM

## 2020-03-11 DIAGNOSIS — Z1211 Encounter for screening for malignant neoplasm of colon: Secondary | ICD-10-CM

## 2020-03-11 LAB — POCT URINALYSIS DIPSTICK
Bilirubin, UA: NEGATIVE
Blood, UA: NEGATIVE
Glucose, UA: NEGATIVE
Ketones, UA: NEGATIVE
Leukocytes, UA: NEGATIVE
Nitrite, UA: NEGATIVE
Protein, UA: NEGATIVE
Spec Grav, UA: 1.025 (ref 1.010–1.025)
Urobilinogen, UA: 0.2 E.U./dL
pH, UA: 6 (ref 5.0–8.0)

## 2020-03-11 LAB — POCT UA - MICROALBUMIN
Albumin/Creatinine Ratio, Urine, POC: <30
Creatinine, POC: 300 mg/dL
Microalbumin Ur, POC: 10 mg/L

## 2020-03-11 MED ORDER — SHINGRIX 50 MCG/0.5ML IM SUSR: 0.5000 mL | Freq: Once | INTRAMUSCULAR | 0 refills | Status: AC

## 2020-03-11 NOTE — Patient Instructions (Signed)
Health Maintenance, Female Adopting a healthy lifestyle and getting preventive care are important in promoting health and wellness. Ask your health care provider about:  The right schedule for you to have regular tests and exams.  Things you can do on your own to prevent diseases and keep yourself healthy. What should I know about diet, weight, and exercise? Eat a healthy diet   Eat a diet that includes plenty of vegetables, fruits, low-fat dairy products, and lean protein.  Do not eat a lot of foods that are high in solid fats, added sugars, or sodium. Maintain a healthy weight Body mass index (BMI) is used to identify weight problems. It estimates body fat based on height and weight. Your health care provider can help determine your BMI and help you achieve or maintain a healthy weight. Get regular exercise Get regular exercise. This is one of the most important things you can do for your health. Most adults should:  Exercise for at least 150 minutes each week. The exercise should increase your heart rate and make you sweat (moderate-intensity exercise).  Do strengthening exercises at least twice a week. This is in addition to the moderate-intensity exercise.  Spend less time sitting. Even light physical activity can be beneficial. Watch cholesterol and blood lipids Have your blood tested for lipids and cholesterol at 66 years of age, then have this test every 5 years. Have your cholesterol levels checked more often if:  Your lipid or cholesterol levels are high.  You are older than 66 years of age.  You are at high risk for heart disease. What should I know about cancer screening? Depending on your health history and family history, you may need to have cancer screening at various ages. This may include screening for:  Breast cancer.  Cervical cancer.  Colorectal cancer.  Skin cancer.  Lung cancer. What should I know about heart disease, diabetes, and high blood  pressure? Blood pressure and heart disease  High blood pressure causes heart disease and increases the risk of stroke. This is more likely to develop in people who have high blood pressure readings, are of African descent, or are overweight.  Have your blood pressure checked: ? Every 3-5 years if you are 18-39 years of age. ? Every year if you are 40 years old or older. Diabetes Have regular diabetes screenings. This checks your fasting blood sugar level. Have the screening done:  Once every three years after age 40 if you are at a normal weight and have a low risk for diabetes.  More often and at a younger age if you are overweight or have a high risk for diabetes. What should I know about preventing infection? Hepatitis B If you have a higher risk for hepatitis B, you should be screened for this virus. Talk with your health care provider to find out if you are at risk for hepatitis B infection. Hepatitis C Testing is recommended for:  Everyone born from 1945 through 1965.  Anyone with known risk factors for hepatitis C. Sexually transmitted infections (STIs)  Get screened for STIs, including gonorrhea and chlamydia, if: ? You are sexually active and are younger than 66 years of age. ? You are older than 66 years of age and your health care provider tells you that you are at risk for this type of infection. ? Your sexual activity has changed since you were last screened, and you are at increased risk for chlamydia or gonorrhea. Ask your health care provider if   you are at risk.  Ask your health care provider about whether you are at high risk for HIV. Your health care provider may recommend a prescription medicine to help prevent HIV infection. If you choose to take medicine to prevent HIV, you should first get tested for HIV. You should then be tested every 3 months for as long as you are taking the medicine. Pregnancy  If you are about to stop having your period (premenopausal) and  you may become pregnant, seek counseling before you get pregnant.  Take 400 to 800 micrograms (mcg) of folic acid every day if you become pregnant.  Ask for birth control (contraception) if you want to prevent pregnancy. Osteoporosis and menopause Osteoporosis is a disease in which the bones lose minerals and strength with aging. This can result in bone fractures. If you are 65 years old or older, or if you are at risk for osteoporosis and fractures, ask your health care provider if you should:  Be screened for bone loss.  Take a calcium or vitamin D supplement to lower your risk of fractures.  Be given hormone replacement therapy (HRT) to treat symptoms of menopause. Follow these instructions at home: Lifestyle  Do not use any products that contain nicotine or tobacco, such as cigarettes, e-cigarettes, and chewing tobacco. If you need help quitting, ask your health care provider.  Do not use street drugs.  Do not share needles.  Ask your health care provider for help if you need support or information about quitting drugs. Alcohol use  Do not drink alcohol if: ? Your health care provider tells you not to drink. ? You are pregnant, may be pregnant, or are planning to become pregnant.  If you drink alcohol: ? Limit how much you use to 0-1 drink a day. ? Limit intake if you are breastfeeding.  Be aware of how much alcohol is in your drink. In the [place] one drink equals one 12 oz bottle of beer (355 mL), one 5 oz glass of wine (148 mL), or one 1 oz glass of hard liquor (44 mL). General instructions  Schedule regular health, dental, and eye exams.  Stay current with your vaccines.  Tell your health care provider if: ? You often feel depressed. ? You have ever been abused or do not feel safe at home. Summary  Adopting a healthy lifestyle and getting preventive care are important in promoting health and wellness.  Follow your health care provider's instructions about healthy  diet, exercising, and getting tested or screened for diseases.  Follow your health care provider's instructions on monitoring your cholesterol and blood pressure. This information is not intended to replace advice given to you by your health care provider. Make sure you discuss any questions you have with your health care provider. Document Revised: 03/06/2018 Document Reviewed: 03/06/2018 [employer]vier Patient Education  2020 [employer]  

## 2020-03-11 NOTE — Progress Notes (Signed)
This visit occurred during the SARS-CoV-2 public health emergency.  Safety protocols were in place, including screening questions prior to the visit, additional usage of staff PPE, and extensive cleaning of exam room while observing appropriate contact time as indicated for disinfecting solutions.  Subjective:   Kaileia Flow is a 64 y.o. female who presents for an Initial Medicare Annual Wellness Visit.  Review of Systems     Cardiac Risk Factors include: advanced age (>98men, >77 women);hypertension;obesity (BMI >30kg/m2);sedentary lifestyle     Objective:    Today's Vitals   03/11/20 1009  BP: (!) 138/98  Pulse: 96  Temp: 98.2 F (36.8 C)  TempSrc: Oral  SpO2: 98%  Weight: 292 lb 9.6 oz (132.7 kg)  Height: 5\' 3"  (1.6 m)   Body mass index is 51.83 kg/m.  Advanced Directives 03/11/2020 03/11/2019 11/04/2018  Does Patient Have a Medical Advance Directive? No No No  Would patient like information on creating a medical advance directive? No - Patient declined Yes (MAU/Ambulatory/Procedural Areas - Information given) Yes (MAU/Ambulatory/Procedural Areas - Information given)    Current Medications (verified) Outpatient Encounter Medications as of 03/11/2020  Medication Sig  . amLODipine (NORVASC) 5 MG tablet Take 1 tablet (5 mg total) by mouth daily.  Marland Kitchen atorvastatin (LIPITOR) 40 MG tablet Take one tablet by mouth once daily  . Biotin 1000 MCG tablet Take 1,000 mcg by mouth daily.  Marland Kitchen BYSTOLIC 20 MG TABS Take 1 tablet by mouth once daily  . cetirizine (ZYRTEC) 10 MG tablet Take 10 mg by mouth daily.  . Cholecalciferol (VITAMIN D) 50 MCG (2000 UT) CAPS Take 1 capsule by mouth daily.  . cyclobenzaprine (FLEXERIL) 10 MG tablet TAKE 1 TABLET(10 MG) BY MOUTH THREE TIMES DAILY  . fluticasone (FLONASE) 50 MCG/ACT nasal spray Flonase Allergy Relief 50 mcg/actuation nasal spray,suspension  INSTILL 2 SPRAYS IN EACH NOSTRIL QD  . hydrocortisone cream 0.5 % 1 application as needed.   .  Magnesium 500 MG TABS Take 1 tablet by mouth daily.  Marland Kitchen omeprazole (PRILOSEC) 20 MG capsule Take 20 mg by mouth daily.  Marland Kitchen PRESCRIPTION MEDICATION STRENGTIA 30MG  BID  . PRESCRIPTION MEDICATION HEPATO-CI BID  . PRESCRIPTION MEDICATION OMEGA CO3-SE BID  . PRESCRIPTION MEDICATION b12 GD ONE DROPPER  . PRESCRIPTION MEDICATION GLYSEN BID  . spironolactone (ALDACTONE) 25 MG tablet Take 1 tablet (25 mg total) by mouth daily.  Marland Kitchen topiramate (TOPAMAX) 50 MG tablet Take 1 tablet (50 mg total) by mouth 2 (two) times daily. Start 50 mg once daily at bedtime x 1 week and then twice daily  . TRIAMCINOLONE ACETONIDE, TOP, 0.05 % OINT Apply 1 application topically as needed.  . Vitamin E 180 MG CAPS Take 1 capsule by mouth daily.  Marland Kitchen amoxicillin (AMOXIL) 500 MG tablet Take 1 tablet (500 mg total) by mouth 2 (two) times daily.   No facility-administered encounter medications on file as of 03/11/2020.    Allergies (verified) Hydralazine, Meperidine hcl, Aliskiren, Aspirin, Latex, Oxycodone-acetaminophen, Rofecoxib, Spironolactone, Furosemide, and Sulfa antibiotics   History: Past Medical History:  Diagnosis Date  . Arthritis   . Hypertension   . Hypertensive crisis   . Osteoporosis   . Rheumatoid arthritis ([place])   . Sleep apnea    Past Surgical History:  Procedure Laterality Date  . ABDOMINAL HYSTERECTOMY     PARTIAL  . CHOLECYSTECTOMY    . PROTESTIC HIPS    . PROTESTIC KNEE     Family History  Problem Relation Age of Onset  . Hypertension  Mother   . Cancer Mother   . Heart disease Mother   . Kidney disease Father   . Heart disease Father   . Diabetes Father   . Hypertension Sister    Social History   Socioeconomic History  . Marital status: Single    Spouse name: Not on file  . Number of children: Not on file  . Years of education: Not on file  . Highest education level: Not on file  Occupational History  . Not on file  Tobacco Use  . Smoking status: Never Smoker  . Smokeless  tobacco: Never Used  Vaping Use  . Vaping Use: Never used  Substance and Sexual Activity  . Alcohol use: Not Currently  . Drug use: Not Currently  . Sexual activity: Not on file  Other Topics Concern  . Not on file  Social History Narrative   Lives alone   Right handed   Drinks no caffeine   Social Determinants of Health   Financial Resource Strain: Low Risk   . Difficulty of Paying Living Expenses: Not hard at all  Food Insecurity: No Food Insecurity  . Worried About Charity fundraiser in the Last Year: Never true  . Ran Out of Food in the Last Year: Never true  Transportation Needs: No Transportation Needs  . Lack of Transportation (Medical): No  . Lack of Transportation (Non-Medical): No  Physical Activity: Inactive  . Days of Exercise per Week: 0 days  . Minutes of Exercise per Session: 0 min  Stress: No Stress Concern Present  . Feeling of Stress : Not at all  Social Connections: Not on file    Tobacco Counseling Counseling given: Not Answered   Clinical Intake:  Pre-visit preparation completed: Yes  Pain : No/denies pain     Nutritional Status: BMI > 30  Obese Nutritional Risks: None  How often do you need to have someone help you when you read instructions, pamphlets, or other written materials from your doctor or pharmacy?: 1 - Never What is the last grade level you completed in school?: MS in nursing  Diabetic? no  Interpreter Needed?: No  Information entered by :: NAllen LPN   Activities of Daily Living In your present state of health, do you have any difficulty performing the following activities: 03/11/2020  Hearing? N  Vision? N  Difficulty concentrating or making decisions? N  Walking or climbing stairs? Y  Dressing or bathing? N  Doing errands, shopping? N  Preparing Food and eating ? N  Using the Toilet? N  In the past six months, have you accidently leaked urine? Y  Comment wears depends  Do you have problems with loss of bowel  control? N  Managing your Medications? N  Managing your Finances? N  Housekeeping or managing your Housekeeping? N  Some recent data might be hidden    Patient Care Team: [name], FNP as PCP - General ([place]) Belva Crome, MD as PCP - Cardiology (Cardiology) Daneen Schick as Social Worker [place], Claudette Stapler, RN as Case Manager  Indicate any recent Medical Services you may have received from other than [facility] providers in the past year (date may be approximate).     Assessment:   This is a routine wellness examination for Ronasia.  Hearing/Vision screen  Hearing Screening   125Hz  250Hz  500Hz  1000Hz  2000Hz  3000Hz  4000Hz  6000Hz  8000Hz   Right ear:           Left ear:  Vision Screening Comments: Regular eye exams,   Dietary issues and exercise activities discussed: Current Exercise Habits: The patient does not participate in regular exercise at present  Goals    .  "I know I can learn to eat a little better" (pt-stated)      Current Barriers:  Marland Kitchen Knowledge Deficits related to disease process and treatment management of Diabetes . Chronic Disease Management support and education needs related to DMII, HTN, Sleep Apnea  Nurse Case Manager Clinical Goal(s):  Marland Kitchen Over the next 90 days, patient will work with the CCM team to address needs related to improved diabetes disease management   CCM RN CM Interventions:  05/21/19 call completed with patient  . Evaluation of current treatment plan related to Diabetes and patient's adherence to plan as established by provider . Reinforced education to patient re: daily glycemic goal is to achieve glucose levels between 80-130; reiterated target A1C range of less than 5.7% for optimal management and to return to normal range; Reinforced Self Care interventions to lower A1C including implementing daily exercise regimen of 150 minutes weekly; eating a low carb, low sugar diet with use of Meal planning and portion control;  discussed importance of maintaining weight within normal BMI and taking prescribed medications as directed  . Discussed Ms. Alcocer is newly established with Dr. Wynema Birch an integrated medicine doctor to help establish a homeopathic DM treatment plan  . Reviewed medications with patient and discussed current pharmacological treatment for DM including indication, dosage and frequency of meds . Advised patient, providing education and rationale, to check cbg daily before meals and record, calling the CCM team and or PCP for findings outside established parameters .  Discussed plans with patient for ongoing care management follow up and provided patient with direct contact information for care management team  Patient Self Care Activities:  . Self administers medications as prescribed . Attends all scheduled provider appointments . Calls pharmacy for medication refills . Attends church or other social activities . Performs ADL's independently . Performs IADL's independently . Calls provider office for new concerns or questions  Please see past updates related to this goal by clicking on the "Past Updates" button in the selected goal      .  Collaborate with RN Care Manager to perform appropriate assessment to assist with care coordination needs      CARE PLAN ENTRY (see longitudinal plan of care for additional care plan information)  Current Barriers:  . Chronic disease management support and education needs related to HTN, Sleep Apnea, Polypharmacy, and Prediabetes   Social Work Clinical Goal(s):  Marland Kitchen Over the next 120 days the patient will work with SW to identify and address and acute and/or chronic care coordination needs related to the self health management of HTN, sleep apnea, polypharmacy, and prediabetes.  CCM SW Interventions: Completed 01/15/20 . Inter-disciplinary care team collaboration (see longitudinal plan of care) . Successful outbound call placed to the patient to  complete an [facility] screen; no acute challenges noted at this time . Discussed long term follow up with SW while the patient remains actively engaged with RN Care Manager to address care management needs . Scheduled follow up call over the next 90 days  Patient Self Care Activities:  . Patient verbalizes understanding of plan to contact SW as needed prior to next scheduled call . Self administers medications as prescribed . Attends all scheduled provider appointments . Calls pharmacy for medication refills . Performs ADL's independently  Initial goal  documentation     .  I would like to get better control of my blood pressure so I can feel better (pt-stated)      Current Barriers:  . Uncontrolled hypertension, complicated by obesity, hyperlipidemia . Current antihypertensive regimen: amlodipine 10mg , bystolic, clonidine  . Previous antihypertensives tried: hydralazine, spironolactone . Current allergy/intolerance list:  o spironolactone, furosemide, and hydrochlorothiazide o Sulfa allergy  . Current home BP readings: 130-140s/80-100s . Patient complaining of edema.  Likely causes amlodipine vs venous statsis vs cardiac . Patient does not like how she feels on her current medications.  Unfortunately, there are limited options when it comes to her anti-hypertensive regimen.  Pharmacist Clinical Goal(s):  Marland Kitchen Over the next 90 days, patient will work with PharmD and providers to optimize antihypertensive regimen  CCM RN CM Interventions: 05/21/19 call completed with patient  . Determined patient is Self monitoring her BP and reports all readings are WNL (equal to or less than 130/80) . Evaluation of current treatment plan related to HTN and patient's adherence to plan as established by provider. . Reviewed medications with patient and discussed patient is adhering to her prescribed medication regimen for HTN and denies having noted SE . Discussed plans with patient for ongoing care  management follow up and provided patient with direct contact information for care management team . Advised patient, providing education and rationale, to monitor blood pressure daily and record, calling the CCM team and or PCP for findings outside established parameters.  . Reviewed scheduled/upcoming provider appointments including: 06/09/19 @ 10:30 AM   Patient Self Care Activities:  . Patient will continue to check BP daily , document, and provide at future appointments . Patient will focus on medication adherence by continuing to take medications as prescribed.  Please see past updates related to this goal by clicking on the "Past Updates" button in the selected goal      .  Increase physical activity      "increase physical activity"      .  Patient Stated      03/11/2020, to join a health club    .  Weight (lb) < 200 lb (90.7 kg)      "I would like to drop more weight"       Depression Screen PHQ 2/9 Scores 03/11/2020 06/09/2019 03/11/2019 01/06/2019 11/04/2018 09/30/2018  PHQ - 2 Score 0 0 0 0 0 0    Fall Risk Fall Risk  03/11/2020 06/09/2019 03/11/2019 01/06/2019 12/04/2018  Falls in the past year? 1 1 1  0 1  Comment slipped in socks - - - -  Number falls in past yr: 1 0 0 - 0  Injury with Fall? 0 0 0 - 0  Risk for fall due to : Impaired mobility;Medication side effect - - - -  Follow up Falls evaluation completed;Education provided;Falls prevention discussed - - - -    FALL RISK PREVENTION PERTAINING TO THE HOME:  Any stairs in or around the home? No  If so, are there any without handrails? n/a Home free of loose throw rugs in walkways, pet beds, electrical cords, etc? Yes  Adequate lighting in your home to reduce risk of falls? Yes   ASSISTIVE DEVICES UTILIZED TO PREVENT FALLS:  Life alert? No  Use of a cane, walker or w/c? Yes  Grab bars in the bathroom? Yes  Shower chair or bench in shower? Yes  Elevated toilet seat or a handicapped toilet? Yes   TIMED UP AND  GO:  Was the test performed? No .     Gait slow and steady with assistive device  Cognitive Function: MMSE - Mini Mental State Exam 03/11/2019 09/26/2018  Orientation to time 5 5  Orientation to Place 5 4  Registration 3 3  Attention/ Calculation 5 5  Recall 2 2  Language- name 2 objects 2 2  Language- repeat 1 1  Language- follow 3 step command 3 3  Language- read & follow direction 1 1  Write a sentence 1 1  Copy design 1 1  Total score 29 28     6CIT Screen 03/11/2020 03/11/2019  What Year? 0 points 0 points  What month? 0 points 0 points  What time? 0 points 0 points  Count back from 20 0 points 0 points  Months in reverse 0 points 0 points  Repeat phrase 0 points 6 points  Total Score 0 6    Immunizations Immunization History  Administered Date(s) Administered  . Influenza, High Dose Seasonal PF 01/06/2019  . PFIZER SARS-COV-2 Vaccination 06/06/2019, 06/30/2019, 01/21/2020    TDAP status: Due, Education has been provided regarding the importance of this vaccine. Advised may receive this vaccine at local pharmacy or Health Dept. Aware to provide a copy of the vaccination record if obtained from local pharmacy or Health Dept. Verbalized acceptance and understanding.  Flu Vaccine status: Up to date  Pneumococcal vaccine status: Up to date  Covid-19 vaccine status: Completed vaccines  Qualifies for Shingles Vaccine? Yes   Zostavax completed No   Shingrix Completed?: No.    Education has been provided regarding the importance of this vaccine. Patient has been advised to call insurance company to determine out of pocket expense if they have not yet received this vaccine. Advised may also receive vaccine at local pharmacy or Health Dept. Verbalized acceptance and understanding.  Screening Tests Health Maintenance  Topic Date Due  . TETANUS/TDAP  Never done  . COLONOSCOPY  Never done  . PNA vac Low Risk Adult (1 of 2 - PCV13) Never done  . INFLUENZA VACCINE   10/26/2019  . COVID-19 Vaccine (4 - Booster for Pfizer series) 07/21/2020  . MAMMOGRAM  09/09/2021  . DEXA SCAN  Completed  . Hepatitis C Screening  Completed    Health Maintenance  Health Maintenance Due  Topic Date Due  . TETANUS/TDAP  Never done  . COLONOSCOPY  Never done  . PNA vac Low Risk Adult (1 of 2 - PCV13) Never done  . INFLUENZA VACCINE  10/26/2019    Colorectal cancer screening: Referral to GI placed today. Pt aware the office will call re: appt.  Mammogram status: Completed 09/10/2019. Repeat every year  Bone Density status: Completed 09/10/2019. Results reflect: Bone density results: NORMAL. Repeat every 0 years.  Lung Cancer Screening: (Low Dose CT Chest recommended if Age 58-80 years, 30 pack-year currently smoking OR have quit w/in 15years.) does not qualify.   Lung Cancer Screening Referral: no  Additional Screening:  Hepatitis C Screening: does qualify; Completed 01/06/2019  Vision Screening: Recommended annual ophthalmology exams for early detection of glaucoma and other disorders of the eye. Is the patient up to date with their annual eye exam?  Yes  Who is the provider or what is the name of the office in which the patient attends annual eye exams? [facility] If pt is not established with a provider, would they like to be referred to a provider to establish care? No .   Dental Screening: Recommended annual dental  exams for proper oral hygiene  Community Resource Referral / Chronic Care Management: CRR required this visit?  No   CCM required this visit?  No      Plan:     I have personally reviewed and noted the following in the patient's chart:   . Medical and social history . Use of alcohol, tobacco or illicit drugs  . Current medications and supplements . Functional ability and status . Nutritional status . Physical activity . Advanced directives . List of other physicians . Hospitalizations, surgeries, and ER visits in previous 12  months . Vitals . Screenings to include cognitive, depression, and falls . Referrals and appointments  In addition, I have reviewed and discussed with patient certain preventive protocols, quality metrics, and best practice recommendations. A written personalized care plan for preventive services as well as general preventive health recommendations were provided to patient.     Kellie Simmering, LPN   [id number]   Nurse Notes:

## 2020-03-11 NOTE — Progress Notes (Signed)
I,[name] [employer] as a Education administrator for [employer], FNP.,have documented all relevant documentation on the behalf of [name], FNP,as directed by  [name], FNP while in the presence of [name], [place]. This visit occurred during the SARS-CoV-2 public health emergency.  Safety protocols were in place, including screening questions prior to the visit, additional usage of staff PPE, and extensive cleaning of exam room while observing appropriate contact time as indicated for disinfecting solutions.  Subjective:     Patient ID: Brandi [name] , female    DOB: 02-Oct-1955 , 64 y.o.   MRN: [id number]   Chief Complaint  Patient presents with  . Annual Exam    HPI  Here for HM.   She is being followed by Dr. [name] for her Diabetes - no changes to her meds she is taking B12, Vitamin D and p - she talks with him every 2 weeks  She has seen the Neurologist (Dr. Leonie Man) who made some changes to her medications - topiramate is increased.  Wt Readings from Last 3 Encounters: 03/11/20 : 292 lb 8.8 oz (132.7 kg) 03/11/20 : 292 lb 9.6 oz (132.7 kg) 02/25/20 : 298 lb (135.2 kg) Her weight in August was 294    Past Medical History:  Diagnosis Date  . Arthritis   . Hypertension   . Hypertensive crisis   . Osteoporosis   . Rheumatoid arthritis ([place])   . Sleep apnea      Family History  Problem Relation Age of Onset  . Hypertension Mother   . Cancer Mother   . Heart disease Mother   . Kidney disease Father   . Heart disease Father   . Diabetes Father   . Hypertension Sister      Current Outpatient Medications:  .  amLODipine (NORVASC) 5 MG tablet, Take 1 tablet (5 mg total) by mouth daily., Disp: 90 tablet, Rfl: 1 .  atorvastatin (LIPITOR) 40 MG tablet, Take one tablet by mouth once daily, Disp: 90 tablet, Rfl: 3 .  Biotin 1000 MCG tablet, Take 1,000 mcg by mouth daily., Disp: , Rfl:  .  BYSTOLIC 20 MG TABS, Take 1 tablet by mouth once daily, Disp: 90 tablet, Rfl: 3 .   cetirizine (ZYRTEC) 10 MG tablet, Take 10 mg by mouth daily., Disp: , Rfl:  .  Cholecalciferol (VITAMIN D) 50 MCG (2000 UT) CAPS, Take 1 capsule by mouth daily., Disp: , Rfl:  .  cyclobenzaprine (FLEXERIL) 10 MG tablet, TAKE 1 TABLET(10 MG) BY MOUTH THREE TIMES DAILY, Disp: 270 tablet, Rfl: 1 .  fluticasone (FLONASE) 50 MCG/ACT nasal spray, Flonase Allergy Relief 50 mcg/actuation nasal spray,suspension  INSTILL 2 SPRAYS IN EACH NOSTRIL QD, Disp: , Rfl:  .  hydrocortisone cream 0.5 %, 1 application as needed. , Disp: , Rfl:  .  Magnesium 500 MG TABS, Take 1 tablet by mouth daily., Disp: , Rfl:  .  omeprazole (PRILOSEC) 20 MG capsule, Take 20 mg by mouth daily., Disp: , Rfl:  .  PRESCRIPTION MEDICATION, STRENGTIA 30MG  BID, Disp: , Rfl:  .  PRESCRIPTION MEDICATION, HEPATO-CI BID, Disp: , Rfl:  .  PRESCRIPTION MEDICATION, OMEGA CO3-SE BID, Disp: , Rfl:  .  PRESCRIPTION MEDICATION, b12 GD ONE DROPPER, Disp: , Rfl:  .  PRESCRIPTION MEDICATION, GLYSEN BID, Disp: , Rfl:  .  spironolactone (ALDACTONE) 25 MG tablet, Take 1 tablet (25 mg total) by mouth daily., Disp: 90 tablet, Rfl: 3 .  topiramate (TOPAMAX) 50 MG tablet, Take 1 tablet (50 mg  total) by mouth 2 (two) times daily. Start 50 mg once daily at bedtime x 1 week and then twice daily, Disp: 60 tablet, Rfl: 2 .  TRIAMCINOLONE ACETONIDE, TOP, 0.05 % OINT, Apply 1 application topically as needed., Disp: , Rfl:  .  Vitamin E 180 MG CAPS, Take 1 capsule by mouth daily., Disp: , Rfl:  .  Zoster Vaccine Adjuvanted [facility]) injection, Inject 0.5 mLs into the muscle once for 1 dose., Disp: 0.5 mL, Rfl: 0   Allergies  Allergen Reactions  . Hydralazine Itching  . Meperidine Hcl Other (See Comments)  . Aliskiren Hives  . Aspirin Nausea Only  . Latex     hives  . Oxycodone-Acetaminophen Itching  . Rofecoxib Diarrhea  . Spironolactone Other (See Comments)  . Furosemide Other (See Comments)    Rash and extreme dehydration  . Sulfa Antibiotics Rash       The patient states she uses status post hysterectomy  Negative for: breast discharge, breast lump(s), breast pain and breast self exam. Associated symptoms include abnormal vaginal bleeding. Pertinent negatives include abnormal bleeding (hematology), anxiety, decreased libido, depression, difficulty falling sleep, dyspareunia, history of infertility, nocturia, sexual dysfunction, sleep disturbances, urinary incontinence, urinary urgency, vaginal discharge and vaginal itching. Diet regular; eating more healthy options. The patient states her exercise level is minimal due to the cold weather, causes her joint pain   The patient's tobacco use is:  Social History   Tobacco Use  Smoking Status Never Smoker  Smokeless Tobacco Never Used   She has been exposed to passive smoke. The patient's alcohol use is:  Social History   Substance and Sexual Activity  Alcohol Use Not Currently    Review of Systems  Constitutional: Negative.   HENT: Negative.   Eyes: Negative.   Respiratory: Negative.   Cardiovascular: Negative.   Gastrointestinal: Negative.   Endocrine: Negative.   Genitourinary: Negative.   Musculoskeletal: Negative.   Skin: Negative.   Allergic/Immunologic: Negative.   Neurological: Negative.  Negative for headaches (improved per patient).  Hematological: Negative.   Psychiatric/Behavioral: Negative.      Today's Vitals   03/11/20 1040  BP: (!) 138/98  Pulse: 96  Temp: 98.2 F (36.8 C)  TempSrc: Oral  Weight: 292 lb 8.8 oz (132.7 kg)  Height: 5\' 3"  (1.6 m)  PainSc: 0-No pain   Body mass index is 51.82 kg/m.   Objective:  Physical Exam Constitutional:      General: She is not in acute distress.    Appearance: Normal appearance. She is well-developed. She is obese.     Comments: Central obesity  HENT:     Head: Normocephalic and atraumatic.     Right Ear: Hearing, tympanic membrane, ear canal and external ear normal. There is no impacted cerumen.     Left  Ear: Hearing, tympanic membrane, ear canal and external ear normal. There is no impacted cerumen.     Nose:     Comments: Deferred - masked    Mouth/Throat:     Comments: Deferred - masked Eyes:     General: Lids are normal.     Extraocular Movements: Extraocular movements intact.     Conjunctiva/sclera: Conjunctivae normal.     Pupils: Pupils are equal, round, and reactive to light.     Funduscopic exam:    Right eye: No papilledema.        Left eye: No papilledema.  Neck:     Thyroid: No thyroid mass.     Vascular: No  carotid bruit.  Cardiovascular:     Rate and Rhythm: Normal rate and regular rhythm.     Pulses: Normal pulses.     Heart sounds: Normal heart sounds. No murmur heard.   Pulmonary:     Effort: Pulmonary effort is normal. No respiratory distress.     Breath sounds: Normal breath sounds. No wheezing.  Chest:     Chest wall: No mass.  Breasts:     Tanner Score is 5.     Right: Normal. No mass, tenderness, axillary adenopathy or supraclavicular adenopathy.     Left: Normal. No mass, tenderness, axillary adenopathy or supraclavicular adenopathy.    Abdominal:     General: Abdomen is flat. Bowel sounds are normal. There is no distension.     Palpations: Abdomen is soft.     Tenderness: There is no abdominal tenderness.  Genitourinary:    Rectum: Guaiac result negative.  Musculoskeletal:        General: No swelling or tenderness. Normal range of motion.     Cervical back: Full passive range of motion without pain, normal range of motion and neck supple.     Right lower leg: No edema.     Left lower leg: No edema.     Comments: Wearing support socks  Lymphadenopathy:     Upper Body:     Right upper body: No supraclavicular, axillary or pectoral adenopathy.     Left upper body: No supraclavicular, axillary or pectoral adenopathy.  Skin:    General: Skin is warm and dry.     Capillary Refill: Capillary refill takes less than 2 seconds.  Neurological:      General: No focal deficit present.     Mental Status: She is alert and oriented to person, place, and time.     Cranial Nerves: No cranial nerve deficit.     Sensory: No sensory deficit.     Motor: No weakness.  Psychiatric:        Mood and Affect: Mood normal.        Behavior: Behavior normal.        Thought Content: Thought content normal.        Judgment: Judgment normal.         Assessment And Plan:     1. Encounter for general adult medical examination w/o abnormal findings . Behavior modifications discussed and diet history reviewed.   . Pt will continue to exercise regularly and modify diet with low GI, plant based foods and decrease intake of processed foods.  . Recommend intake of daily multivitamin, Vitamin D, and calcium.  . Recommend mammogram and colonoscopy for preventive screenings, as well as recommend immunizations that include influenza, TDAP, and Shingles (ordered shingrix at pharmacy)  2. Encounter for screening colonoscopy  According to USPTF Colorectal cancer Screening guidelines. Colonoscopy is recommended every 10 years, starting at age 31years.  Will refer to GI for colon cancer screening. - Ambulatory referral to Gastroenterology  3. Essential hypertension . B/P is elevated, advised to make sure she is staying well hydrated with water and avoiding high salt foods.  Marland Kitchen CMP was done in November with normal kidney functions had a slight elevated sodium, she needs to drink more water.  . The importance of regular exercise and dietary modification was stressed to the patient.  . Stressed importance of losing ten percent of her body weight to help with B/P control.  . The weight loss would help with decreasing cardiac and cancer risk as well.  Marland Kitchen  EKG was done in October  4. Prediabetes  Chronic, controlled, she has been seeing Dr. Coralee Pesa for her diabetes -   Continue with current medications  Encouraged to limit intake of sugary foods and  drinks  Encouraged to increase physical activity to 150 minutes per week - Hemoglobin A1c  5. Mixed hyperlipidemia  Chronic, controlled  Continue with current medications, tolerating medications well - Lipid panel  6. 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 Encounter for immunization  She is to get at the pharmacy, she is to find out if there are any issues with taking with various allergies she has.  - Zoster Vaccine Adjuvanted [facility]) injection; Inject 0.5 mLs into the muscle once for 1 dose.  Dispense: 0.5 mL; Refill: 0     Patient was given opportunity to ask questions. Patient verbalized understanding of the plan and was able to repeat key elements of the plan. All questions were answered to their satisfaction.   Teola Bradley, FNP, have reviewed all documentation for this visit. The documentation on 03/11/20 for the exam, diagnosis, procedures, and orders are all accurate and complete.  THE PATIENT IS ENCOURAGED TO PRACTICE SOCIAL DISTANCING DUE TO THE COVID-19 PANDEMIC.

## 2020-03-11 NOTE — Patient Instructions (Signed)
[name] , Thank you for taking time to come for your Medicare Wellness Visit. I appreciate your ongoing commitment to your health goals. Please review the following plan we discussed and let me know if I can assist you in the future.   Screening recommendations/referrals: Colonoscopy: cologuard ordered Mammogram: completed 09/10/2019 Bone Density: completed 09/10/2019 Recommended yearly ophthalmology/optometry visit for glaucoma screening and checkup Recommended yearly dental visit for hygiene and checkup  Vaccinations: Influenza vaccine:  Pneumococcal vaccine: completed 11/12/2019 Tdap vaccine: due Shingles vaccine: discussed   Covid-19: 06/06/2019, 06/30/2019, 01/21/2020  Advanced directives: Advance directive discussed with you today. Even though you declined this today please call our office should you change your mind and we can give you the proper paperwork for you to fill out.  Conditions/risks identified: none  Next appointment: Follow up in one year for your annual wellness visit    Preventive Care 65 Years and Older, Female Preventive care refers to lifestyle choices and visits with your health care provider that can promote health and wellness. What does preventive care include?  A yearly physical exam. This is also called an annual well check.  Dental exams once or twice a year.  Routine eye exams. Ask your health care provider how often you should have your eyes checked.  Personal lifestyle choices, including:  Daily care of your teeth and gums.  Regular physical activity.  Eating a healthy diet.  Avoiding tobacco and drug use.  Limiting alcohol use.  Practicing safe sex.  Taking low-dose aspirin every day.  Taking vitamin and mineral supplements as recommended by your health care provider. What happens during an annual well check? The services and screenings done by your health care provider during your annual well check will depend on your age, overall  health, lifestyle risk factors, and family history of disease. Counseling  Your health care provider may ask you questions about your:  Alcohol use.  Tobacco use.  Drug use.  Emotional well-being.  Home and relationship well-being.  Sexual activity.  Eating habits.  History of falls.  Memory and ability to understand (cognition).  Work and work Statistician.  Reproductive health. Screening  You may have the following tests or measurements:  Height, weight, and BMI.  Blood pressure.  Lipid and cholesterol levels. These may be checked every 5 years, or more frequently if you are over 27 years old.  Skin check.  Lung cancer screening. You may have this screening every year starting at age 10 if you have a 30-pack-year history of smoking and currently smoke or have quit within the past 15 years.  Fecal occult blood test (FOBT) of the stool. You may have this test every year starting at age 49.  Flexible sigmoidoscopy or colonoscopy. You may have a sigmoidoscopy every 5 years or a colonoscopy every 10 years starting at age 61.  Hepatitis C blood test.  Hepatitis B blood test.  Sexually transmitted disease (STD) testing.  Diabetes screening. This is done by checking your blood sugar (glucose) after you have not eaten for a while (fasting). You may have this done every 1-3 years.  Bone density scan. This is done to screen for osteoporosis. You may have this done starting at age 72.  Mammogram. This may be done every 1-2 years. Talk to your health care provider about how often you should have regular mammograms. Talk with your health care provider about your test results, treatment options, and if necessary, the need for more tests. Vaccines  Your health  care provider may recommend certain vaccines, such as:  Influenza vaccine. This is recommended every year.  Tetanus, diphtheria, and acellular pertussis (Tdap, Td) vaccine. You may need a Td booster every 10  years.  Zoster vaccine. You may need this after age 64.  Pneumococcal 13-valent conjugate (PCV13) vaccine. One dose is recommended after age 46.  Pneumococcal polysaccharide (PPSV23) vaccine. One dose is recommended after age 76. Talk to your health care provider about which screenings and vaccines you need and how often you need them. This information is not intended to replace advice given to you by your health care provider. Make sure you discuss any questions you have with your health care provider. Document Released: 04/09/2015 Document Revised: 12/01/2015 Document Reviewed: 01/12/2015 Elsevier Interactive Patient Education  2017 [place] Prevention in the Home Falls can cause injuries. They can happen to people of all ages. There are many things you can do to make your home safe and to help prevent falls. What can I do on the outside of my home?  Regularly fix the edges of walkways and driveways and fix any cracks.  Remove anything that might make you trip as you walk through a door, such as a raised step or threshold.  Trim any bushes or trees on the path to your home.  Use bright outdoor lighting.  Clear any walking paths of anything that might make someone trip, such as rocks or tools.  Regularly check to see if handrails are loose or broken. Make sure that both sides of any steps have handrails.  Any raised decks and porches should have guardrails on the edges.  Have any leaves, snow, or ice cleared regularly.  Use sand or salt on walking paths during winter.  Clean up any spills in your garage right away. This includes oil or grease spills. What can I do in the bathroom?  Use night lights.  Install grab bars by the toilet and in the tub and shower. Do not use towel bars as grab bars.  Use non-skid mats or decals in the tub or shower.  If you need to sit down in the shower, use a plastic, non-slip stool.  Keep the floor dry. Clean up any water that  spills on the floor as soon as it happens.  Remove soap buildup in the tub or shower regularly.  Attach bath mats securely with double-sided non-slip rug tape.  Do not have throw rugs and other things on the floor that can make you trip. What can I do in the bedroom?  Use night lights.  Make sure that you have a light by your bed that is easy to reach.  Do not use any sheets or blankets that are too big for your bed. They should not hang down onto the floor.  Have a firm chair that has side arms. You can use this for support while you get dressed.  Do not have throw rugs and other things on the floor that can make you trip. What can I do in the kitchen?  Clean up any spills right away.  Avoid walking on wet floors.  Keep items that you use a lot in easy-to-reach places.  If you need to reach something above you, use a strong step stool that has a grab bar.  Keep electrical cords out of the way.  Do not use floor polish or wax that makes floors slippery. If you must use wax, use non-skid floor wax.  Do not have  throw rugs and other things on the floor that can make you trip. What can I do with my stairs?  Do not leave any items on the stairs.  Make sure that there are handrails on both sides of the stairs and use them. Fix handrails that are broken or loose. Make sure that handrails are as long as the stairways.  Check any carpeting to make sure that it is firmly attached to the stairs. Fix any carpet that is loose or worn.  Avoid having throw rugs at the top or bottom of the stairs. If you do have throw rugs, attach them to the floor with carpet tape.  Make sure that you have a light switch at the top of the stairs and the bottom of the stairs. If you do not have them, ask someone to add them for you. What else can I do to help prevent falls?  Wear shoes that:  Do not have high heels.  Have rubber bottoms.  Are comfortable and fit you well.  Are closed at the  toe. Do not wear sandals.  If you use a stepladder:  Make sure that it is fully opened. Do not climb a closed stepladder.  Make sure that both sides of the stepladder are locked into place.  Ask someone to hold it for you, if possible.  Clearly mark and make sure that you can see:  Any grab bars or handrails.  First and last steps.  Where the edge of each step is.  Use tools that help you move around (mobility aids) if they are needed. These include:  Canes.  Walkers.  Scooters.  Crutches.  Turn on the lights when you go into a dark area. Replace any light bulbs as soon as they burn out.  Set up your furniture so you have a clear path. Avoid moving your furniture around.  If any of your floors are uneven, fix them.  If there are any pets around you, be aware of where they are.  Review your medicines with your doctor. Some medicines can make you feel dizzy. This can increase your chance of falling. Ask your doctor what other things that you can do to help prevent falls. This information is not intended to replace advice given to you by your health care provider. Make sure you discuss any questions you have with your health care provider. Document Released: 01/07/2009 Document Revised: 08/19/2015 Document Reviewed: 04/17/2014 Elsevier Interactive Patient Education  2017 [employer].

## 2020-03-12 LAB — LIPID PANEL
Chol/HDL Ratio: 2.6 ratio (ref 0.0–4.4)
Cholesterol, Total: 150 mg/dL (ref 100–199)
HDL: 58 mg/dL (ref >39)
LDL Chol Calc (NIH): 79 mg/dL (ref 0–99)
Triglycerides: 66 mg/dL (ref 0–149)
VLDL Cholesterol Cal: 13 mg/dL (ref 5–40)

## 2020-03-12 LAB — HEMOGLOBIN A1C
Est. average glucose Bld gHb Est-mCnc: 126 mg/dL
Hgb A1c MFr Bld: 6 % — ABNORMAL HIGH (ref 4.8–5.6)

## 2020-03-15 ENCOUNTER — Telehealth: Payer: Medicare Other

## 2020-03-16 ENCOUNTER — Encounter: Payer: Self-pay | Admitting: Nurse Practitioner

## 2020-03-21 NOTE — Progress Notes (Signed)
Patient ID: Brandi [name]                 DOB: [birth year]                      MRN: [id number]     HPI: Brandi [name] a 64 y.o.femalereferred by Dr. Corey Skains HTN clinic. PMH is significant for severe hypertension, hypertensive heart disease, congestive heart failure(EF 55-60% in 2020), obstructive sleep apnea, multiple medication intolerances, significant bilateral lower extremity edema, and morbid obesity, prediabetes (A1c 6.1%). Last seen in HTN clinic on 03/09/20 at which BP 138/72 was above goal and spironolactone was increased to 25 mg daily and continued amlodipine 5 mg daily.  Patient presents today in good spiritsambulating with cane. Reports medication adherence with spironolactone and amlodipine daily. Reports mild itching on hand and face but improved with cream prescribed from dermatology. Denies dizziness, headache, and blurred vision. Denies major changes in LE swelling (uses TED hosing). Reports checking BP with large cuff on forearm with readings averaging mid 130s/80s.  Current HTN meds:amlodipine 5 mg daily(PM), spironolactone25mg  daily(AM) Previously tried:hydralazine (itching), Aliskiren (hives), Lasix(rash and extreme dehydration) BP goal:<130/80 mmHg  Family History:Diabetes in her father; Heart disease in her father and mother; Hypertension in her mother and sister; Kidney disease in her father.  Social History:denies tobacco use; drinks alcohol rarely  Diet: plans to work with weight loss clinic   Exercise: limited due to cane but plans to work with weight loss clinic  Home BP readings: 12/16 - 12/28 BP: 138/98, 136/90, 124/89, 132/85, 139/87, 138/88, 132/80, 131/86, 132/88, 132/84, 142/95, 135/85, 125/82  Wt Readings from Last 3 Encounters:  03/11/20 292 lb 8.8 oz (132.7 kg)  03/11/20 292 lb 9.6 oz (132.7 kg)  02/25/20 298 lb (135.2 kg)   BP Readings from Last 3 Encounters:  03/11/20 (!) 138/98  03/11/20 (!) 138/98  03/09/20 138/72   Pulse  Readings from Last 3 Encounters:  03/11/20 96  03/11/20 96  03/09/20 84    Renal function: CrCl cannot be calculated (Patient's most recent lab result is older than the maximum 21 days allowed.).  Past Medical History:  Diagnosis Date  . Arthritis   . Hypertension   . Hypertensive crisis   . Osteoporosis   . Rheumatoid arthritis ([place])   . Sleep apnea     Current Outpatient Medications on File Prior to Visit  Medication Sig Dispense Refill  . amLODipine (NORVASC) 5 MG tablet Take 1 tablet (5 mg total) by mouth daily. 90 tablet 1  . atorvastatin (LIPITOR) 40 MG tablet Take one tablet by mouth once daily 90 tablet 3  . Biotin 1000 MCG tablet Take 1,000 mcg by mouth daily.    Marland Kitchen BYSTOLIC 20 MG TABS Take 1 tablet by mouth once daily 90 tablet 3  . cetirizine (ZYRTEC) 10 MG tablet Take 10 mg by mouth daily.    . Cholecalciferol (VITAMIN D) 50 MCG (2000 UT) CAPS Take 1 capsule by mouth daily.    . cyclobenzaprine (FLEXERIL) 10 MG tablet TAKE 1 TABLET(10 MG) BY MOUTH THREE TIMES DAILY 270 tablet 1  . fluticasone (FLONASE) 50 MCG/ACT nasal spray Flonase Allergy Relief 50 mcg/actuation nasal spray,suspension  INSTILL 2 SPRAYS IN EACH NOSTRIL QD    . hydrocortisone cream 0.5 % 1 application as needed.     . Magnesium 500 MG TABS Take 1 tablet by mouth daily.    Marland Kitchen omeprazole (PRILOSEC) 20 MG capsule Take 20 mg by mouth daily.    Marland Kitchen  PRESCRIPTION MEDICATION STRENGTIA 30MG  BID    . PRESCRIPTION MEDICATION HEPATO-CI BID    . PRESCRIPTION MEDICATION OMEGA CO3-SE BID    . PRESCRIPTION MEDICATION b12 GD ONE DROPPER    . PRESCRIPTION MEDICATION GLYSEN BID    . spironolactone (ALDACTONE) 25 MG tablet Take 1 tablet (25 mg total) by mouth daily. 90 tablet 3  . topiramate (TOPAMAX) 50 MG tablet Take 1 tablet (50 mg total) by mouth 2 (two) times daily. Start 50 mg once daily at bedtime x 1 week and then twice daily 60 tablet 2  . TRIAMCINOLONE ACETONIDE, TOP, 0.05 % OINT Apply 1 application topically as  needed.    . Vitamin E 180 MG CAPS Take 1 capsule by mouth daily.     No current facility-administered medications on file prior to visit.    Allergies  Allergen Reactions  . Hydralazine Itching  . Meperidine Hcl Other (See Comments)  . Aliskiren Hives  . Aspirin Nausea Only  . Latex     hives  . Oxycodone-Acetaminophen Itching  . Rofecoxib Diarrhea  . Spironolactone Other (See Comments)  . Furosemide Other (See Comments)    Rash and extreme dehydration  . Sulfa Antibiotics Rash     Assessment/Plan:  1. Hypertension - BP above goal <130/80 mmHg and home readings average mid 130s/80s. Medication adherence appears optimal. Discussed increasing spironolactone to 50 mg daily, however patient will like to monitor BP for an additional 2 weeks before making any adjustments. Continued spironolactone 25 mg daily and amlodipine 5 mg daily. Encouraged patient to bring home BP monitor and cuff to next visit. Patient verbalized understanding. Discussed following up with weight loss clinic in 2 weeks. Will check BMET today after recent dose increase of spironolactone. Follow-up HTN appointment scheduled in 2 weeks.  2. Weight loss - Pt was referred to weight loss management clinic but has not heard from them yet to schedule appt. Will revisit starting Wegovy at next appt.  Lorel Monaco, PharmD, [place] [facility] [id number], [place], [place] [address] Phone: [phone]; Fax: [phone]

## 2020-03-23 ENCOUNTER — Other Ambulatory Visit: Payer: Medicare Other | Admitting: *Deleted

## 2020-03-23 ENCOUNTER — Ambulatory Visit (INDEPENDENT_AMBULATORY_CARE_PROVIDER_SITE_OTHER): Payer: Medicare Other | Admitting: Pharmacist

## 2020-03-23 ENCOUNTER — Encounter: Payer: Self-pay | Admitting: Pharmacist

## 2020-03-23 VITALS — BP 136/76 | HR 92

## 2020-03-23 DIAGNOSIS — I10 Essential (primary) hypertension: Secondary | ICD-10-CM | POA: Diagnosis not present

## 2020-03-23 NOTE — Patient Instructions (Addendum)
It was nice to see you today!  Y74our goal blood pressure is <130/80 mmHg. In clinic, your blood pressure was 136/76 mmHg.  Medication Changes:  Continue spironolactone 25 mg daily  Continue amlodipine 5 mg daily   Monitor blood pressure at home daily and keep a log (on your phone or piece of paper) to bring with you to your next visit. Write down date, time, blood pressure and pulse.  Keep up the good work with diet and exercise. Aim for a diet full of vegetables, fruit and lean meats (chicken, [place], fish). Try to limit salt intake by eating fresh or frozen vegetables (instead of canned), rinse canned vegetables prior to cooking and do not add any additional salt to meals.   Please give [name] a call at [phone] with any questions or concerns.

## 2020-03-24 LAB — BASIC METABOLIC PANEL
BUN/Creatinine Ratio: 19 (ref 12–28)
BUN: 17 mg/dL (ref 8–27)
CO2: 20 mmol/L (ref 20–29)
Calcium: 10 mg/dL (ref 8.7–10.3)
Chloride: 106 mmol/L (ref 96–106)
Creatinine, Ser: 0.9 mg/dL (ref 0.57–1.00)
GFR calc Af Amer: 77 mL/min/{1.73_m2} (ref >59)
GFR calc non Af Amer: 67 mL/min/{1.73_m2} (ref >59)
Glucose: 109 mg/dL — ABNORMAL HIGH (ref 65–99)
Potassium: 4.2 mmol/L (ref 3.5–5.2)
Sodium: 141 mmol/L (ref 134–144)

## 2020-04-05 ENCOUNTER — Ambulatory Visit: Payer: Federal, State, Local not specified - PPO

## 2020-04-05 DIAGNOSIS — G4730 Sleep apnea, unspecified: Secondary | ICD-10-CM

## 2020-04-05 DIAGNOSIS — I10 Essential (primary) hypertension: Secondary | ICD-10-CM

## 2020-04-05 NOTE — Patient Instructions (Signed)
Goals we discussed today:  Goals Addressed            This Visit's Progress   . Work with SW to manage care coordination needs       Timeframe:  Long-Range Goal Priority:  Low Start Date:   1.10.22                         Expected End Date: 5.10.22                      Next planned outreach: 3.22.22  Patient Goals/Self-Care Activities . Over the next 90 days, patient will:   - Patient will self administer medications as prescribed Patient will attend all scheduled provider appointments Patient will call provider office for new concerns or questions Contact SW as needed prior to next scheduled call

## 2020-04-05 NOTE — Chronic Care Management (AMB) (Signed)
Chronic Care Management    Social Work Note  04/05/2020 Name: Brandi [name] MRN: [id number] DOB: 01-14-1956  Brandi [name] is a 65 y.o. year old female who is a primary care patient of [name], [place]. The CCM team was consulted to assist the patient with chronic disease management and/or care coordination needs.  Engaged with patient by telephone for follow up visit in response to provider referral for social work chronic care management and care coordination services.   Consent to Services:  The patient was given information about Chronic Care Management services, agreed to services, and gave verbal consent prior to initiation of services.  Please see initial visit note for detailed documentation.   Patient agreed to services and consent obtained.   Assessment/Interventions: Review of patient past medical history, allergies, medications, and health status, including review of relevant consultants reports was performed today as part of a comprehensive evaluation and provision of chronic care management and care coordination services.     SDOH (Social Determinants of Health) assessments and interventions performed: No  Advanced Directives Status: Not addressed in this encounter.  CCM Care Plan  Allergies  Allergen Reactions  . Hydralazine Itching  . Meperidine Hcl Other (See Comments)  . Aliskiren Hives  . Aspirin Nausea Only  . Latex     hives  . Oxycodone-Acetaminophen Itching  . Rofecoxib Diarrhea  . Spironolactone Other (See Comments)  . Furosemide Other (See Comments)    Rash and extreme dehydration  . Sulfa Antibiotics Rash    Outpatient Encounter Medications as of 04/05/2020  Medication Sig  . amLODipine (NORVASC) 5 MG tablet Take 1 tablet (5 mg total) by mouth daily.  Marland Kitchen atorvastatin (LIPITOR) 40 MG tablet Take one tablet by mouth once daily  . Biotin 1000 MCG tablet Take 1,000 mcg by mouth daily.  Marland Kitchen BYSTOLIC 20 MG TABS Take 1 tablet by mouth once daily  .  cetirizine (ZYRTEC) 10 MG tablet Take 10 mg by mouth daily.  . Cholecalciferol (VITAMIN D) 50 MCG (2000 UT) CAPS Take 1 capsule by mouth daily.  . cyclobenzaprine (FLEXERIL) 10 MG tablet TAKE 1 TABLET(10 MG) BY MOUTH THREE TIMES DAILY  . fluticasone (FLONASE) 50 MCG/ACT nasal spray Flonase Allergy Relief 50 mcg/actuation nasal spray,suspension  INSTILL 2 SPRAYS IN EACH NOSTRIL QD  . hydrocortisone cream 0.5 % 1 application as needed.   . Magnesium 500 MG TABS Take 1 tablet by mouth daily.  Marland Kitchen omeprazole (PRILOSEC) 20 MG capsule Take 20 mg by mouth daily.  Marland Kitchen PRESCRIPTION MEDICATION STRENGTIA 30MG  BID  . PRESCRIPTION MEDICATION HEPATO-CI BID  . PRESCRIPTION MEDICATION OMEGA CO3-SE BID  . PRESCRIPTION MEDICATION b12 GD ONE DROPPER  . PRESCRIPTION MEDICATION GLYSEN BID  . spironolactone (ALDACTONE) 25 MG tablet Take 1 tablet (25 mg total) by mouth daily.  Marland Kitchen topiramate (TOPAMAX) 50 MG tablet Take 1 tablet (50 mg total) by mouth 2 (two) times daily. Start 50 mg once daily at bedtime x 1 week and then twice daily  . TRIAMCINOLONE ACETONIDE, TOP, 0.05 % OINT Apply 1 application topically as needed.  . Vitamin E 180 MG CAPS Take 1 capsule by mouth daily.   No facility-administered encounter medications on file as of 04/05/2020.    Patient Active Problem List   Diagnosis Date Noted  . Prediabetes 10/09/2019  . Essential hypertension 10/09/2019  . Hypertensive heart disease with heart failure ([place]) 06/09/2019  . Morbid obesity ([place]) 06/09/2019    Conditions to be addressed/monitored: HTN and sleep apnea  Patient  Care Plan: Social Work Care Plan    Problem Identified: Care Coordination     Long-Range Goal: Collaborate with RN Care Manager to perform appropriate assessments to assist with care coordination needs   Start Date: 04/05/2020  Expected End Date: 08/03/2020  This Visit's Progress: On track  Priority: Low  Note:   Current Barriers:  . Chronic disease management support and education  needs related to HTN and sleep apnea    Social Worker Clinical Goal(s):  Marland Kitchen Over the next 120 days, patient will work with SW to identify and address any acute and/or chronic care coordination needs related to the self health management of HTN and sleep apnea    CCM SW Interventions:  . Inter-disciplinary care team collaboration (see longitudinal plan of care) . Successful outbound call placed to the patient to assist with care coordination needs . Determined the patient was seen by neurologist in December 2021 to address ongoing headaches- medication changes were successful, patient no longer experiencing headache . Discussed patient may want to attend a clinic in [place] to address Neuropathy in feet but is currently undecided . Advised the patient is she would like to attend and needs a referral to reach out to her primary care team . Discussed long term follow up with SW while patient remains actively involved with  RN Case Manager  to address care management needs . Scheduled follow up call over the next 90 days Patient Goals/Self-Care Activities . Over the next 90 days, patient will:   - Patient will self administer medications as prescribed Patient will attend all scheduled provider appointments Patient will call provider office for new concerns or questions Contact SW as needed prior to next scheduled call Follow Up Plan:  SW will follow up with the patient over the next 90 days       Follow Up Plan: SW will follow up with patient by phone over the next 90 days.      Daneen Schick, BSW, CDP Social Worker, Certified Dementia Practitioner [facility] [phone]  Total time spent performing care coordination and/or care management activities with the patient by phone or face to face = 14 minutes.

## 2020-04-05 NOTE — Progress Notes (Signed)
Patient ID: Brandi [name]                 DOB: 25-Mar-1956                      MRN: [id number]     HPI: Brandi [name] a 65 y.o.femalereferred by Dr. Corey Skains HTN clinic. PMH is significant for severe hypertension, hypertensive heart disease, congestive heart failure(EF 55-60% in 2020), obstructive sleep apnea, multiple medication intolerances, significant bilateral lower extremity edema, and morbid obesity, prediabetes (A1c 6.1%).Last seen in HTN clinic on 03/23/20 at which BP 136/76 was above but near goal. No medication changes were made.   Patient presents today in good spirits ambulating with a cane. Reports medication adherence with amlodipine, spironolactone, and Bystolic daily, however did not take any of her BP medications prior to visit this morning yet. Home BP readings range between 135-139/87-90s with two highs of 147/96 and 149/90. Patient reports recently stressed due to death of close friend and family member during the holidays. Patient also reports she is sensitive to drugs and has been taking Bystolic for years and has been tolerating well with no complaints. Additionally, reports continued mild itching but improved with ointment prescribed from dermatology.   Pt brought home BP monitor to clinic today - uses a standard adult cuff and places it on the forearm. Home BP monitor measured 183/125 mmHg - this reading does not completely correlate with clinic BP 192/108 using left upper arm with thigh cuff. However, pt is limited to using standard cuff on forearm due to size of arm and lack of access to a XXL cuff. Denies dizziness, headaches, blurred vision, and chest pain/palpitations with elevated BP today.  Reports improvements in LE swelling and uses TED hosing  Current HTN meds:amlodipine 5 mg daily(PM), spironolactone25mg  daily(AM), Bystolic 20 mg daily (AM) Previously tried:hydralazine (itching), Aliskiren (hives), Lasix(rash and extreme dehydration) BP goal:<130/80  mmHg  Family History:Diabetes in her father; Heart disease in her father and mother; Hypertension in her mother and sister; Kidney disease in her father.  Social History:denies tobacco use; drinks alcohol rarely  Diet: plans to work with weight loss clinic   Exercise: limited due to cane but plans to work with weight loss clinic:   Home BP readings: 03/24/20 to 04/06/20 BP: 137/86, 139/87, 135/90, 134/82, 137/87, 139/90, 133/87, 147/96, 149/90, 137/94, 138/90, 139/97, 130/92  Wt Readings from Last 3 Encounters:  03/11/20 292 lb 8.8 oz (132.7 kg)  03/11/20 292 lb 9.6 oz (132.7 kg)  02/25/20 298 lb (135.2 kg)   BP Readings from Last 3 Encounters:  03/23/20 136/76  03/11/20 (!) 138/98  03/11/20 (!) 138/98   Pulse Readings from Last 3 Encounters:  03/23/20 92  03/11/20 96  03/11/20 96    Renal function: CrCl cannot be calculated (Unknown ideal weight.).  Past Medical History:  Diagnosis Date  . Arthritis   . Hypertension   . Hypertensive crisis   . Osteoporosis   . Rheumatoid arthritis ([place])   . Sleep apnea     Current Outpatient Medications on File Prior to Visit  Medication Sig Dispense Refill  . amLODipine (NORVASC) 5 MG tablet Take 1 tablet (5 mg total) by mouth daily. 90 tablet 1  . atorvastatin (LIPITOR) 40 MG tablet Take one tablet by mouth once daily 90 tablet 3  . Biotin 1000 MCG tablet Take 1,000 mcg by mouth daily.    Marland Kitchen BYSTOLIC 20 MG TABS Take 1 tablet by mouth once daily 90  tablet 3  . cetirizine (ZYRTEC) 10 MG tablet Take 10 mg by mouth daily.    . Cholecalciferol (VITAMIN D) 50 MCG (2000 UT) CAPS Take 1 capsule by mouth daily.    . cyclobenzaprine (FLEXERIL) 10 MG tablet TAKE 1 TABLET(10 MG) BY MOUTH THREE TIMES DAILY 270 tablet 1  . fluticasone (FLONASE) 50 MCG/ACT nasal spray Flonase Allergy Relief 50 mcg/actuation nasal spray,suspension  INSTILL 2 SPRAYS IN EACH NOSTRIL QD    . hydrocortisone cream 0.5 % 1 application as needed.     . Magnesium  500 MG TABS Take 1 tablet by mouth daily.    Marland Kitchen omeprazole (PRILOSEC) 20 MG capsule Take 20 mg by mouth daily.    Marland Kitchen PRESCRIPTION MEDICATION STRENGTIA 30MG  BID    . PRESCRIPTION MEDICATION HEPATO-CI BID    . PRESCRIPTION MEDICATION OMEGA CO3-SE BID    . PRESCRIPTION MEDICATION b12 GD ONE DROPPER    . PRESCRIPTION MEDICATION GLYSEN BID    . spironolactone (ALDACTONE) 25 MG tablet Take 1 tablet (25 mg total) by mouth daily. 90 tablet 3  . topiramate (TOPAMAX) 50 MG tablet Take 1 tablet (50 mg total) by mouth 2 (two) times daily. Start 50 mg once daily at bedtime x 1 week and then twice daily 60 tablet 2  . TRIAMCINOLONE ACETONIDE, TOP, 0.05 % OINT Apply 1 application topically as needed.    . Vitamin E 180 MG CAPS Take 1 capsule by mouth daily.     No current facility-administered medications on file prior to visit.    Allergies  Allergen Reactions  . Hydralazine Itching  . Meperidine Hcl Other (See Comments)  . Aliskiren Hives  . Aspirin Nausea Only  . Latex     hives  . Oxycodone-Acetaminophen Itching  . Rofecoxib Diarrhea  . Spironolactone Other (See Comments)  . Furosemide Other (See Comments)    Rash and extreme dehydration  . Sulfa Antibiotics Rash    Assessment/Plan:  1. Hypertension - BP above goal <130/80 mmHg and home readings continue to average high 130s/80s. Medication adherence appears optimal, however pt did not take BP medications prior to visit this morning, so BP is much higher today in office. Will increase spironolactone from 25 mg to 50 mg daily. Continued amlodipine 5 mg daily and Bystolic 20 mg daily. Follow-up HTN appointment and labs (BMET) scheduled in 2 weeks.  2. Weight loss - Pt was referred to weight loss management clinic but has not heard from them to schedule appt. Discussed clinical benefits and potential side effects of Wegovy, and provided patient brochure handout. Patient will like to revisit initiation in 2 weeks.  Lorel Monaco, PharmD, [facility] [address] [place], [place] [address] Phone: [phone]; Fax: [phone]

## 2020-04-06 ENCOUNTER — Ambulatory Visit (INDEPENDENT_AMBULATORY_CARE_PROVIDER_SITE_OTHER): Payer: Medicare Other | Admitting: Pharmacist

## 2020-04-06 ENCOUNTER — Encounter: Payer: Self-pay | Admitting: Pharmacist

## 2020-04-06 ENCOUNTER — Other Ambulatory Visit: Payer: Self-pay

## 2020-04-06 VITALS — BP 192/108 | HR 93

## 2020-04-06 DIAGNOSIS — I10 Essential (primary) hypertension: Secondary | ICD-10-CM | POA: Diagnosis not present

## 2020-04-06 MED ORDER — SPIRONOLACTONE 50 MG PO TABS: 50.0000 mg | ORAL_TABLET | Freq: Every day | ORAL | 3 refills | Status: DC

## 2020-04-06 NOTE — Patient Instructions (Addendum)
It was nice to see you today!  Your goal blood pressure is <130/80 mmHg. In clinic, your blood pressure was 192/108 mmHg.  Medication Changes: Begin taking spironolactone 50 mg daily in the mornings  Continue amlodipine 5 mg daily in the evening  Continue Bystolic 20 mg daily in the mornings  Monitor blood pressure at home daily and keep a log (on your phone or piece of paper) to bring with you to your next visit. Write down date, time, blood pressure and pulse.  Keep up the good work with diet and exercise. Aim for a diet full of vegetables, fruit and lean meats (chicken, [place], fish). Try to limit salt intake by eating fresh or frozen vegetables (instead of canned), rinse canned vegetables prior to cooking and do not add any additional salt to meals.   Please give [name] a call at [phone] with any questions or concerns.  Please consider starting Wegovy (semaglutide) for weight loss. Please see handout attached

## 2020-04-17 NOTE — Progress Notes (Unsigned)
Patient ID: Brandi [name]                 DOB: 08-Jun-1955                      MRN: [id number]     HPI: Brandi [name] a 65 y.o.femalereferred by Dr. Corey Skains HTN clinic. PMH is significant for severe hypertension, hypertensive heart disease, congestive heart failure(EF 55-60% in 2020), obstructive sleep apnea, multiple medication intolerances, significant bilateral lower extremity edema, and morbid obesity, prediabetes (A1c 6.1%).Last seen in HTN clinic on1/11/22at which pt did not take BP meds prior to visit and BP was elevated at 192/108. Home readings averaged high 130s/80s and spironolactone was increased from 25 mg to 50 mg daily.  Patient presents today in good spirits ambulating with a cane. Reports ***  BMET today  LV:   Home BP readings 135-139/87-90s with two highs of 147/96 and 149/90  stressed due to death of close friend and family member during the holidays  Compliance? Took meds this morning? When do you take your meds? Dizziness, headaches, blurred vision? History of swelling? Check Clinic BP? Home BP logs? If no logs, bring to next visit w/ BP cuff Go over BP goals Additional BP therapy if needed . Increase spiro 75 mg vs 100 mg pending BMET results . Add ARB pending BMET results . Discuss Wegovy initiation again  Diet??  Exercise??   Current HTN meds: amlodipine 5 mg daily(PM), spironolactone 50mg  daily(AM), Bystolic 20 mg daily (AM) Previously tried:hydralazine (itching), Aliskiren (hives), Lasix(rash and extreme dehydration) BP goal:<130/80 mmHg  Family History:Diabetes in her father; Heart disease in her father and mother; Hypertension in her mother and sister; Kidney disease in her father.  Social History:denies tobacco use; drinks alcohol rarely  Diet:plans to work with weight loss clinic  Exercise:limited due to cane but plans to work with weight loss clinic:   Home BP readings:   Wt Readings from Last 3 Encounters:  03/11/20  292 lb 8.8 oz (132.7 kg)  03/11/20 292 lb 9.6 oz (132.7 kg)  02/25/20 298 lb (135.2 kg)   BP Readings from Last 3 Encounters:  04/06/20 (!) 192/108  03/23/20 136/76  03/11/20 (!) 138/98   Pulse Readings from Last 3 Encounters:  04/06/20 93  03/23/20 92  03/11/20 96    Renal function: CrCl cannot be calculated (Patient's most recent lab result is older than the maximum 21 days allowed.).  Past Medical History:  Diagnosis Date  . Arthritis   . Hypertension   . Hypertensive crisis   . Osteoporosis   . Rheumatoid arthritis ([place])   . Sleep apnea     Current Outpatient Medications on File Prior to Visit  Medication Sig Dispense Refill  . amLODipine (NORVASC) 5 MG tablet Take 1 tablet (5 mg total) by mouth daily. 90 tablet 1  . atorvastatin (LIPITOR) 40 MG tablet Take one tablet by mouth once daily 90 tablet 3  . Biotin 1000 MCG tablet Take 1,000 mcg by mouth daily.    Marland Kitchen BYSTOLIC 20 MG TABS Take 1 tablet by mouth once daily 90 tablet 3  . cetirizine (ZYRTEC) 10 MG tablet Take 10 mg by mouth daily.    . Cholecalciferol (VITAMIN D) 50 MCG (2000 UT) CAPS Take 1 capsule by mouth daily.    . cyclobenzaprine (FLEXERIL) 10 MG tablet TAKE 1 TABLET(10 MG) BY MOUTH THREE TIMES DAILY 270 tablet 1  . fluticasone (FLONASE) 50 MCG/ACT nasal spray Flonase Allergy Relief  50 mcg/actuation nasal spray,suspension  INSTILL 2 SPRAYS IN EACH NOSTRIL QD    . hydrocortisone cream 0.5 % 1 application as needed.     . Magnesium 500 MG TABS Take 1 tablet by mouth daily.    Marland Kitchen omeprazole (PRILOSEC) 20 MG capsule Take 20 mg by mouth daily.    Marland Kitchen PRESCRIPTION MEDICATION STRENGTIA 30MG  BID    . PRESCRIPTION MEDICATION HEPATO-CI BID    . PRESCRIPTION MEDICATION OMEGA CO3-SE BID    . PRESCRIPTION MEDICATION b12 GD ONE DROPPER    . PRESCRIPTION MEDICATION GLYSEN BID    . spironolactone (ALDACTONE) 50 MG tablet Take 1 tablet (50 mg total) by mouth daily. 90 tablet 3  . topiramate (TOPAMAX) 50 MG tablet Take 1  tablet (50 mg total) by mouth 2 (two) times daily. Start 50 mg once daily at bedtime x 1 week and then twice daily 60 tablet 2  . TRIAMCINOLONE ACETONIDE, TOP, 0.05 % OINT Apply 1 application topically as needed.    . Vitamin E 180 MG CAPS Take 1 capsule by mouth daily.     No current facility-administered medications on file prior to visit.    Allergies  Allergen Reactions  . Hydralazine Itching  . Meperidine Hcl Other (See Comments)  . Aliskiren Hives  . Aspirin Nausea Only  . Latex     hives  . Oxycodone-Acetaminophen Itching  . Rofecoxib Diarrhea  . Spironolactone Other (See Comments)  . Furosemide Other (See Comments)    Rash and extreme dehydration  . Sulfa Antibiotics Rash     Assessment/Plan:  1. Hypertension -   Lorel Monaco, PharmD, [facility] [address] [place], [place] [address] Phone: [phone]; Fax: [phone]

## 2020-04-19 ENCOUNTER — Telehealth: Payer: Self-pay | Admitting: Interventional Cardiology

## 2020-04-19 ENCOUNTER — Other Ambulatory Visit: Payer: Federal, State, Local not specified - PPO

## 2020-04-19 NOTE — Telephone Encounter (Signed)
Attempted to call patient back, however she did not answer. Left message to contact pharmacy at HTN clinic [facility]) at [phone].

## 2020-04-19 NOTE — Telephone Encounter (Signed)
Left detailed message for pt that she has reached the wrong office to speak to [facility], as she is with another office.  Left message if we can be of assistance with her, to call the office back.

## 2020-04-19 NOTE — Telephone Encounter (Signed)
Patient is calling requesting to speak to [place], [place]. She has an appointment with her tomorrow and has some questions before she comes in. Please advise.

## 2020-04-20 ENCOUNTER — Encounter: Payer: Self-pay | Admitting: Pharmacist

## 2020-04-20 ENCOUNTER — Other Ambulatory Visit: Payer: Medicare Other

## 2020-04-20 ENCOUNTER — Ambulatory Visit: Payer: Federal, State, Local not specified - PPO

## 2020-04-21 ENCOUNTER — Telehealth: Payer: Medicare Other

## 2020-04-27 ENCOUNTER — Telehealth: Payer: Self-pay | Admitting: Pharmacist

## 2020-04-27 DIAGNOSIS — I10 Essential (primary) hypertension: Secondary | ICD-10-CM

## 2020-04-27 NOTE — Telephone Encounter (Signed)
Patient called regarding insurance form requesting change from Bystolic to another BB on formulary. Patient reports sensitivity to particular drugs and has been taking Bystolic for many  years and has tolerated it well with no complaints. A family member will drop of insurance appeal paperwork to be filled and sent back to insurance.   Additionally, patient reports BP has been running higher than normal 140s/90s and reports medication adherence with amlodipine 5 mg daily, spironolactone 50 mg daily, and bystolic 20 mg daily. Reports recent family death which is most likely contributing to elevated BP. Encouraged patient to continue monitoring BP and will follow-up via telephone in 1 week. Patient verbalized understanding.

## 2020-04-28 NOTE — Telephone Encounter (Addendum)
Received appeals/tier exception form for Bystolic from patient. Pt states her last MD in [place] tried her on other beta blockers. She has been taking Bystolic and tolerating it well for years. She experienced hives on both metoprolol and carvedilol, reports she needed to be treated by a dermatologist and it took 1.5 years for her skin to go back to normal. Also experienced vomiting with propranolol. Pt is African American so she is also getting more benefit from Bystolic since it works via the nitric oxide pathway. Will fax request to [facility].

## 2020-05-04 ENCOUNTER — Telehealth: Payer: Self-pay | Admitting: Pharmacist

## 2020-05-04 DIAGNOSIS — I10 Essential (primary) hypertension: Secondary | ICD-10-CM

## 2020-05-04 NOTE — Telephone Encounter (Addendum)
Contacted patient regarding hypertension management. Patient reports still in [place] with sister. Reports BP still running 140s/80-90s - contributes this to emotional/family factors. Reports medication adherence with all HTN medications. Denies headaches and dizziness. Encouraged patient to continue monitoring BP and will follow-up via telephone in 2 weeks to discuss additional options if warranted. Patient verbalized understanding.

## 2020-05-06 ENCOUNTER — Telehealth: Payer: Self-pay

## 2020-05-06 NOTE — Telephone Encounter (Signed)
Hey this patient called saying the GI doctor wont schedule her an appointment until they have all records . Does she need another referral? I see one was made in Dec but they said they closed it due to her not calling back. YL,RMA

## 2020-05-19 MED ORDER — BYSTOLIC 20 MG PO TABS: ORAL_TABLET | ORAL | 3 refills | Status: DC

## 2020-05-19 NOTE — Addendum Note (Signed)
Addended by: Dalaya Suppa E on: 05/19/2020 02:23 PM   Modules accepted: Orders

## 2020-05-19 NOTE — Telephone Encounter (Addendum)
Called insurance for update on authorization request for Bystolic. Representative stated Bystolic is not on formulary and that a formulary exception request needs to be completed (pt had received a tier exception request form to complete). They faxed over a formulary exception request which I have completed and faxed back.

## 2020-05-21 ENCOUNTER — Telehealth: Payer: Self-pay

## 2020-05-21 NOTE — Telephone Encounter (Signed)
**Note De-Identified  Obfuscation** We received a name brand Bystolic PA request from [facility]. Unclear why the pt cannot take preferred Nebivolol and this information will be needed in order to do this PA.  No answer so I left a message asking the pt to call Jeani Hawking back at [phone] at Dr Darliss Ridgel office and I did express the importance of her returning my call.

## 2020-05-24 ENCOUNTER — Other Ambulatory Visit: Payer: Self-pay | Admitting: Nurse Practitioner

## 2020-05-24 NOTE — Telephone Encounter (Signed)
Formulary exception request for Bystolic has been approved from 05/21/20 - 05/21/22. I activated a copay card activated and called pt's pharmacy which has brought the cost of a 3 month supply down from $226 to $28. Called pt to advise her of approval, she was appreciative for assistance.

## 2020-05-25 ENCOUNTER — Ambulatory Visit (INDEPENDENT_AMBULATORY_CARE_PROVIDER_SITE_OTHER): Payer: Medicare Other | Admitting: Nurse Practitioner

## 2020-05-25 ENCOUNTER — Encounter: Payer: Self-pay | Admitting: Nurse Practitioner

## 2020-05-25 ENCOUNTER — Other Ambulatory Visit: Payer: Self-pay

## 2020-05-25 VITALS — BP 118/72 | HR 109 | Temp 98.3°F | Ht 63.4 in | Wt 287.8 lb

## 2020-05-25 DIAGNOSIS — Z6841 Body Mass Index (BMI) 40.0 and over, adult: Secondary | ICD-10-CM | POA: Diagnosis not present

## 2020-05-25 DIAGNOSIS — I10 Essential (primary) hypertension: Secondary | ICD-10-CM | POA: Diagnosis not present

## 2020-05-25 NOTE — Patient Instructions (Signed)

## 2020-05-25 NOTE — Progress Notes (Signed)
I,[name] [employer] as a Education administrator for [employer], FNP.,have documented all relevant documentation on the behalf of [name], FNP,as directed by  [name], FNP while in the presence of [name], [place]. This visit occurred during the SARS-CoV-2 public health emergency.  Safety protocols were in place, including screening questions prior to the visit, additional usage of staff PPE, and extensive cleaning of exam room while observing appropriate contact time as indicated for disinfecting solutions.  Subjective:     Patient ID: Brandi [name] , female    DOB: 1955/08/19 , 65 y.o.   MRN: [id number]   Chief Complaint  Patient presents with  . Hypertension    HPI  Patient presents today for a f/u on her blood pressure. She had a death in the family in 2022/05/15 and her blood pressure has been elevated.  She has been making frequent trips. Her blood pressure has been up to 140/110. She ran out of bystolic, she will pick up her medication when she leaves.  She has been taking amlodipine at 10 mg instead of 5 mg.   Wt Readings from Last 3 Encounters: 05/25/20 : 287 lb 12.8 oz (130.5 kg) 03/11/20 : 292 lb 8.8 oz (132.7 kg) 03/11/20 : 292 lb 9.6 oz (132.7 kg)   Hypertension This is a chronic problem. The current episode started more than 1 year ago. The problem is uncontrolled. Pertinent negatives include no anxiety, blurred vision, chest pain, headaches or palpitations. Risk factors for coronary artery disease include obesity, dyslipidemia and sedentary lifestyle. Past treatments include calcium channel blockers. There are no compliance problems.  There is no history of angina. There is no history of chronic renal disease.  Diabetes She presents for her follow-up diabetic visit. She has type 2 diabetes mellitus. Her disease course has been stable. Pertinent negatives for hypoglycemia include no dizziness or headaches. Pertinent negatives for diabetes include no blurred vision, no chest  pain, no fatigue, no polydipsia, no polyphagia and no polyuria. Symptoms are stable. There are no diabetic complications. Risk factors for coronary artery disease include dyslipidemia, obesity and sedentary lifestyle. Current diabetic treatment includes diet. She is compliant with treatment all of the time. Home blood sugar record trend: fluctuating. (Range 107- 158 )     Past Medical History:  Diagnosis Date  . Arthritis   . Hypertension   . Hypertensive crisis   . Osteoporosis   . Rheumatoid arthritis ([place])   . Sleep apnea      Family History  Problem Relation Age of Onset  . Hypertension Mother   . Cancer Mother   . Heart disease Mother   . Kidney disease Father   . Heart disease Father   . Diabetes Father   . Hypertension Sister      Current Outpatient Medications:  .  amLODipine (NORVASC) 5 MG tablet, Take 1 tablet (5 mg total) by mouth daily., Disp: 90 tablet, Rfl: 1 .  atorvastatin (LIPITOR) 40 MG tablet, Take one tablet by mouth once daily, Disp: 90 tablet, Rfl: 3 .  Biotin 1000 MCG tablet, Take 1,000 mcg by mouth daily., Disp: , Rfl:  .  BYSTOLIC 20 MG TABS, Take 1 tablet by mouth once daily, Disp: 90 tablet, Rfl: 3 .  cetirizine (ZYRTEC) 10 MG tablet, Take 10 mg by mouth daily., Disp: , Rfl:  .  Cholecalciferol (VITAMIN D) 50 MCG (2000 UT) CAPS, Take 1 capsule by mouth daily., Disp: , Rfl:  .  cyclobenzaprine (FLEXERIL) 10 MG tablet, TAKE 1  TABLET(10 MG) BY MOUTH THREE TIMES DAILY, Disp: 270 tablet, Rfl: 1 .  fluticasone (FLONASE) 50 MCG/ACT nasal spray, Flonase Allergy Relief 50 mcg/actuation nasal spray,suspension  INSTILL 2 SPRAYS IN EACH NOSTRIL QD, Disp: , Rfl:  .  hydrocortisone cream 0.5 %, 1 application as needed. , Disp: , Rfl:  .  Magnesium 500 MG TABS, Take 1 tablet by mouth daily., Disp: , Rfl:  .  omeprazole (PRILOSEC) 20 MG capsule, Take 20 mg by mouth daily., Disp: , Rfl:  .  PRESCRIPTION MEDICATION, STRENGTIA 30MG  BID, Disp: , Rfl:  .  PRESCRIPTION  MEDICATION, HEPATO-CI BID, Disp: , Rfl:  .  PRESCRIPTION MEDICATION, OMEGA CO3-SE BID, Disp: , Rfl:  .  PRESCRIPTION MEDICATION, b12 GD ONE DROPPER, Disp: , Rfl:  .  PRESCRIPTION MEDICATION, GLYSEN BID, Disp: , Rfl:  .  spironolactone (ALDACTONE) 50 MG tablet, Take 1 tablet (50 mg total) by mouth daily., Disp: 90 tablet, Rfl: 3 .  topiramate (TOPAMAX) 50 MG tablet, Take 1 tablet (50 mg total) by mouth 2 (two) times daily. Start 50 mg once daily at bedtime x 1 week and then twice daily, Disp: 60 tablet, Rfl: 2 .  TRIAMCINOLONE ACETONIDE, TOP, 0.05 % OINT, Apply 1 application topically as needed., Disp: , Rfl:  .  Vitamin E 180 MG CAPS, Take 1 capsule by mouth daily., Disp: , Rfl:    Allergies  Allergen Reactions  . Hydralazine Itching  . Meperidine Hcl Other (See Comments)  . Aliskiren Hives  . Aspirin Nausea Only  . Latex     hives  . Oxycodone-Acetaminophen Itching  . Rofecoxib Diarrhea  . Spironolactone Other (See Comments)  . Furosemide Other (See Comments)    Rash and extreme dehydration  . Sulfa Antibiotics Rash     Review of Systems  Constitutional: Negative.  Negative for fatigue.  HENT: Negative.   Eyes: Negative.  Negative for blurred vision.  Respiratory: Negative.   Cardiovascular: Negative.  Negative for chest pain, palpitations and leg swelling.  Gastrointestinal: Negative.   Endocrine: Negative.  Negative for polydipsia, polyphagia and polyuria.  Genitourinary: Negative.   Musculoskeletal: Negative.   Skin: Negative.   Neurological: Negative.  Negative for dizziness and headaches.  Hematological: Negative.   Psychiatric/Behavioral: Negative.      Today's Vitals   05/25/20 1616  BP: 118/72  Pulse: (!) 109  Temp: 98.3 F (36.8 C)  TempSrc: Oral  Weight: 287 lb 12.8 oz (130.5 kg)  Height: 5' 3.4" (1.61 m)  PainSc: 6   PainLoc: Leg   Body mass index is 50.34 kg/m.   Objective:  Physical Exam Constitutional:      General: She is not in acute  distress.    Appearance: Normal appearance. She is obese.  Eyes:     Pupils: Pupils are equal, round, and reactive to light.  Cardiovascular:     Rate and Rhythm: Normal rate and regular rhythm.     Pulses: Normal pulses.     Heart sounds: Normal heart sounds. No murmur heard.   Pulmonary:     Effort: Pulmonary effort is normal. No respiratory distress.     Breath sounds: Normal breath sounds. No wheezing.  Musculoskeletal:     Right lower leg: No edema.     Left lower leg: No edema.  Skin:    General: Skin is warm and dry.     Capillary Refill: Capillary refill takes less than 2 seconds.  Neurological:     General: No focal deficit present.  Mental Status: She is alert and oriented to person, place, and time.     Cranial Nerves: No cranial nerve deficit.     Motor: No weakness.  Psychiatric:        Mood and Affect: Mood normal.        Behavior: Behavior normal.        Thought Content: Thought content normal.        Judgment: Judgment normal.         Assessment And Plan:     1. Essential hypertension  Chronic, blood pressure is well controlled  Continue with current medications and follow up with Cardiology  She is encouraged to limit her stress and to focus on her health and to listen to her body when she is tired or having elevated blood pressures  2. Morbid obesity ([place])  She is encouraged to strive for BMI less than 30 to decrease cardiac risk. Advised to aim for at least 150 minutes of exercise per week.   Patient was given opportunity to ask questions. Patient verbalized understanding of the plan and was able to repeat key elements of the plan. All questions were answered to their satisfaction.  [name], FNP    I, [name], FNP, have reviewed all documentation for this visit. The documentation on 05/25/20 for the exam, diagnosis, procedures, and orders are all accurate and complete.  THE PATIENT IS ENCOURAGED TO PRACTICE SOCIAL DISTANCING DUE TO  THE COVID-19 PANDEMIC.

## 2020-05-28 ENCOUNTER — Telehealth: Payer: Federal, State, Local not specified - PPO

## 2020-05-28 ENCOUNTER — Ambulatory Visit (INDEPENDENT_AMBULATORY_CARE_PROVIDER_SITE_OTHER): Payer: Medicare Other

## 2020-05-28 DIAGNOSIS — R7303 Prediabetes: Secondary | ICD-10-CM

## 2020-05-28 DIAGNOSIS — G4730 Sleep apnea, unspecified: Secondary | ICD-10-CM

## 2020-05-28 DIAGNOSIS — I10 Essential (primary) hypertension: Secondary | ICD-10-CM

## 2020-06-03 NOTE — Patient Instructions (Signed)
Goals Addressed    Other   .  Diabetes disease progression prevented or minimized   On track     Timeframe:  Long-Range Goal Priority:  Medium Start Date: 05/28/20                            Expected End Date:  11/29/20     Next Follow Up Date: 08/27/20     . Self administer medications as prescribed . Attend all scheduled provider appointments . Call pharmacy for medication refills . Call provider office for new concerns or questions . Continue to monitor blood sugars at home as discussed  . Continue to adhere to dietary and exercise recommendations  . Review printed education materials and discuss at next RN CM call                   .  Hypertension monitored   On track     Timeframe:  Long-Range Goal Priority:  High Start Date: 05/28/20                            Expected End Date: 11/29/20  Follow Up Date: 08/27/20  Self administers medications as prescribed Attends all scheduled provider appointments Calls provider office for new concerns, questions, or BP outside discussed parameters Checks BP and records as discussed Follows a low sodium diet/DASH diet

## 2020-06-03 NOTE — Chronic Care Management (AMB) (Signed)
Chronic Care Management   CCM RN Visit Note  05/28/2020 Name: Brandi [name] MRN: [id number] DOB: 1955/04/21  Subjective: Brandi [name] is a 65 y.o. year old female who is a primary care patient of [name], [place]. The care management team was consulted for assistance with disease management and care coordination needs.    Engaged with patient by telephone for follow up visit in response to provider referral for case management and/or care coordination services.   Consent to Services:  The patient was given information about Chronic Care Management services, agreed to services, and gave verbal consent prior to initiation of services.  Please see initial visit note for detailed documentation.   Patient agreed to services and verbal consent obtained.   Assessment: Review of patient past medical history, allergies, medications, health status, including review of consultants reports, laboratory and other test data, was performed as part of comprehensive evaluation and provision of chronic care management services.   SDOH (Social Determinants of Health) assessments and interventions performed:  Yes, no acute challenges identified   CCM Care Plan  Allergies  Allergen Reactions  . Hydralazine Itching  . Meperidine Hcl Other (See Comments)  . Aliskiren Hives  . Aspirin Nausea Only  . Latex     hives  . Oxycodone-Acetaminophen Itching  . Rofecoxib Diarrhea  . Spironolactone Other (See Comments)  . Furosemide Other (See Comments)    Rash and extreme dehydration  . Sulfa Antibiotics Rash    Outpatient Encounter Medications as of 05/28/2020  Medication Sig  . amLODipine (NORVASC) 5 MG tablet Take 1 tablet (5 mg total) by mouth daily.  Marland Kitchen atorvastatin (LIPITOR) 40 MG tablet Take one tablet by mouth once daily  . Biotin 1000 MCG tablet Take 1,000 mcg by mouth daily.  Marland Kitchen BYSTOLIC 20 MG TABS Take 1 tablet by mouth once daily  . cetirizine (ZYRTEC) 10 MG tablet Take 10 mg by mouth daily.  .  Cholecalciferol (VITAMIN D) 50 MCG (2000 UT) CAPS Take 1 capsule by mouth daily.  . cyclobenzaprine (FLEXERIL) 10 MG tablet TAKE 1 TABLET(10 MG) BY MOUTH THREE TIMES DAILY  . fluticasone (FLONASE) 50 MCG/ACT nasal spray Flonase Allergy Relief 50 mcg/actuation nasal spray,suspension  INSTILL 2 SPRAYS IN EACH NOSTRIL QD  . hydrocortisone cream 0.5 % 1 application as needed.   . Magnesium 500 MG TABS Take 1 tablet by mouth daily.  Marland Kitchen omeprazole (PRILOSEC) 20 MG capsule Take 20 mg by mouth daily.  Marland Kitchen PRESCRIPTION MEDICATION STRENGTIA 30MG BID  . PRESCRIPTION MEDICATION HEPATO-CI BID  . PRESCRIPTION MEDICATION OMEGA CO3-SE BID  . PRESCRIPTION MEDICATION b12 GD ONE DROPPER  . PRESCRIPTION MEDICATION GLYSEN BID  . spironolactone (ALDACTONE) 50 MG tablet Take 1 tablet (50 mg total) by mouth daily.  Marland Kitchen topiramate (TOPAMAX) 50 MG tablet Take 1 tablet (50 mg total) by mouth 2 (two) times daily. Start 50 mg once daily at bedtime x 1 week and then twice daily  . TRIAMCINOLONE ACETONIDE, TOP, 0.05 % OINT Apply 1 application topically as needed.  . Vitamin E 180 MG CAPS Take 1 capsule by mouth daily.   No facility-administered encounter medications on file as of 05/28/2020.    Patient Active Problem List   Diagnosis Date Noted  . Prediabetes 10/09/2019  . Essential hypertension 10/09/2019  . Morbid obesity ([place]) 06/09/2019    Conditions to be addressed/monitored:HTN, sleep apnea, polypharmacy, prediabetes, Morbid Obesity   Care Plan : Diabetes Type 2 (Adult)  Updates made by Lynne Logan, RN since  06/03/2020 12:00 AM    Problem: Disease Progression (Diabetes, Type 2)   Priority: Medium    Long-Range Goal: Disease Progression Prevented or Minimized   Start Date: 05/28/2020  Expected End Date: 11/29/2020  This Visit's Progress: On track  Priority: Medium  Note:   Current Barriers:  Marland Kitchen Knowledge Deficits related to disease process and treatment management of Diabetes . Chronic Disease Management  support and education needs related to DMII, HTN, Sleep Apnea Nurse Case Manager Clinical Goal(s):  Marland Kitchen Over the next 180 days, patient will work with the CCM RN CM and PCP to address needs related to disease education and support to improve Self Health management of DM as evidence by Diabetes disease progression will be prevented or minimized  CCM RN CM Interventions:  05/28/20 call completed with patient  . Evaluation of current treatment plan related to Diabetes and patient's adherence to plan as established by provider . Reviewed current A1c is at 6.0; Reinforced education to patient re: daily glycemic goal is to achieve fasting glucose levels between 80-130; <180 after meals . Educated on dietary and exercise recommendations  . Reviewed medications with patient and discussed current pharmacological treatment for DM including indication, dosage and frequency of meds . Advised patient, providing education and rationale, to check cbg daily before meals and at bedtime and record, calling the CCM team and or PCP for findings outside established parameters . Mailed printed educational materials related to Diabetes Management using the Plate Method; Grocery Shopping with Diabetes; Carb Choices; Diabetes Care Schedule; Preventing Complications from Diabetes; Diabetes Zone Safety Tool  .  Discussed plans with patient for ongoing care management follow up and provided patient with direct contact information for care management team Patient Self Care Activities:  . Self administer medications as prescribed . Attend all scheduled provider appointments . Call pharmacy for medication refills . Call provider office for new concerns or questions . Continue to monitor blood sugars at home as discussed  . Continue to adhere to dietary and exercise recommendations  . Review printed education materials and discuss at next RN CM call  Next Follow Up Date: 08/27/20    Care Plan : Hypertension (Adult)  Updates made  by Lynne Logan, RN since 06/03/2020 12:00 AM    Problem: Hypertension (Hypertension)   Priority: High    Long-Range Goal: Hypertension Monitored   Start Date: 05/28/2020  Expected End Date: 11/29/2020  This Visit's Progress: On track  Priority: High  Note:   Objective:  . Last practice recorded BP readings:  BP Readings from Last 3 Encounters:  05/25/20 118/72  04/06/20 (!) 192/108  03/23/20 136/76 .   Marland Kitchen Most recent eGFR/CrCl: No results found for: EGFR  No components found for: CRCL Current Barriers:  Marland Kitchen Knowledge Deficits related to basic understanding of hypertension pathophysiology and self care management . Knowledge Deficits related to understanding of medications prescribed for management of hypertension Case Manager Clinical Goal(s):  Marland Kitchen Over the next 180 days, patient will verbalize understanding of plan for hypertension management Interventions:  . Collaboration with [name], FNP regarding development and update of comprehensive plan of care as evidenced by provider attestation and co-signature . Inter-disciplinary care team collaboration (see longitudinal plan of care) . Evaluation of current treatment plan related to hypertension self management and patient's adherence to plan as established by provider. . Provided education to patient re: stroke prevention, s/s of heart attack and stroke, DASH diet, complications of uncontrolled blood pressure . Reviewed medications with patient  and discussed importance of compliance . Advised patient, providing education and rationale, to monitor blood pressure daily and record, calling PCP for findings outside established parameters.  . Reviewed scheduled/upcoming provider appointments including: PCP f/u visit scheduled for 06/30/20 _0 :15 AM  . Discussed plans with patient for ongoing care management follow up and provided patient with direct contact information for care management team Patient Goals: - to keep blood pressure under  good control Self-Care Activities: Self administers medications as prescribed Attends all scheduled provider appointments Calls provider office for new concerns, questions, or BP outside discussed parameters Checks BP and records as discussed Follows a low sodium diet/DASH diet Follow Up Plan: Telephone follow up appointment with care management team member scheduled for: 08/27/20    Plan:Telephone follow up appointment with care management team member scheduled for:  08/27/20  Barb Merino, RN, BSN, CCM Care Management Coordinator [facility]  Direct Phone: [phone]

## 2020-06-15 ENCOUNTER — Ambulatory Visit: Payer: Federal, State, Local not specified - PPO

## 2020-06-15 ENCOUNTER — Telehealth: Payer: Self-pay | Admitting: Pharmacist

## 2020-06-15 DIAGNOSIS — R7303 Prediabetes: Secondary | ICD-10-CM

## 2020-06-15 DIAGNOSIS — I10 Essential (primary) hypertension: Secondary | ICD-10-CM

## 2020-06-15 DIAGNOSIS — Z79899 Other long term (current) drug therapy: Secondary | ICD-10-CM

## 2020-06-15 NOTE — Patient Instructions (Signed)
Social Worker Visit Information  Goals we discussed today:  Goals Addressed            This Visit's Progress   . Work with SW to manage care coordination needs   On track    Timeframe:  Long-Range Goal Priority:  Low Start Date:   1.10.22                         Expected End Date: 5.10.22                      Next planned outreach: 4.26.22  Patient Goals/Self-Care Activities . Over the next 90 days, patient will:   - Patient will self administer medications as prescribed Patient will attend all scheduled provider appointments Patient will call provider office for new concerns or questions Contact SW as needed prior to next scheduled call Engage with providers regarding referrals as needed        Materials Provided: Verbal education about referral process provided by phone  Follow Up Plan: SW will follow up with patient by phone over the next month.   Daneen Schick, BSW, CDP Social Worker, Certified Dementia Practitioner [place] [facility] [phone]

## 2020-06-15 NOTE — Chronic Care Management (AMB) (Signed)
Chronic Care Management    Social Work Note  06/15/2020 Name: Brandi [name] MRN: [id number] DOB: 08-05-1955  Brandi [name] is a 65 y.o. year old female who is a primary care patient of [name], [place]. The CCM team was consulted to assist the patient with chronic disease management and/or care coordination needs related to: HTN, Polypharmacy, and Pre-Diabetes.   Engaged with patient by telephone for follow up visit in response to provider referral for social work chronic care management and care coordination services.   Consent to Services:  The patient was given information about Chronic Care Management services, agreed to services, and gave verbal consent prior to initiation of services.  Please see initial visit note for detailed documentation.   Patient agreed to services and consent obtained.   Assessment: Review of patient past medical history, allergies, medications, and health status, including review of relevant consultants reports was performed today as part of a comprehensive evaluation and provision of chronic care management and care coordination services.     SDOH (Social Determinants of Health) assessments and interventions performed:    Advanced Directives Status: Not addressed in this encounter.  CCM Care Plan  Allergies  Allergen Reactions  . Hydralazine Itching  . Meperidine Hcl Other (See Comments)  . Aliskiren Hives  . Aspirin Nausea Only  . Latex     hives  . Oxycodone-Acetaminophen Itching  . Rofecoxib Diarrhea  . Spironolactone Other (See Comments)  . Furosemide Other (See Comments)    Rash and extreme dehydration  . Sulfa Antibiotics Rash    Outpatient Encounter Medications as of 06/15/2020  Medication Sig  . amLODipine (NORVASC) 5 MG tablet Take 1 tablet (5 mg total) by mouth daily.  Marland Kitchen atorvastatin (LIPITOR) 40 MG tablet Take one tablet by mouth once daily  . Biotin 1000 MCG tablet Take 1,000 mcg by mouth daily.  Marland Kitchen BYSTOLIC 20 MG TABS Take 1  tablet by mouth once daily  . cetirizine (ZYRTEC) 10 MG tablet Take 10 mg by mouth daily.  . Cholecalciferol (VITAMIN D) 50 MCG (2000 UT) CAPS Take 1 capsule by mouth daily.  . cyclobenzaprine (FLEXERIL) 10 MG tablet TAKE 1 TABLET(10 MG) BY MOUTH THREE TIMES DAILY  . fluticasone (FLONASE) 50 MCG/ACT nasal spray Flonase Allergy Relief 50 mcg/actuation nasal spray,suspension  INSTILL 2 SPRAYS IN EACH NOSTRIL QD  . hydrocortisone cream 0.5 % 1 application as needed.   . Magnesium 500 MG TABS Take 1 tablet by mouth daily.  Marland Kitchen omeprazole (PRILOSEC) 20 MG capsule Take 20 mg by mouth daily.  Marland Kitchen PRESCRIPTION MEDICATION STRENGTIA 30MG  BID  . PRESCRIPTION MEDICATION HEPATO-CI BID  . PRESCRIPTION MEDICATION OMEGA CO3-SE BID  . PRESCRIPTION MEDICATION b12 GD ONE DROPPER  . PRESCRIPTION MEDICATION GLYSEN BID  . spironolactone (ALDACTONE) 50 MG tablet Take 1 tablet (50 mg total) by mouth daily.  Marland Kitchen topiramate (TOPAMAX) 50 MG tablet Take 1 tablet (50 mg total) by mouth 2 (two) times daily. Start 50 mg once daily at bedtime x 1 week and then twice daily  . TRIAMCINOLONE ACETONIDE, TOP, 0.05 % OINT Apply 1 application topically as needed.  . Vitamin E 180 MG CAPS Take 1 capsule by mouth daily.   No facility-administered encounter medications on file as of 06/15/2020.    Patient Active Problem List   Diagnosis Date Noted  . Prediabetes 10/09/2019  . Essential hypertension 10/09/2019  . Morbid obesity ([place]) 06/09/2019    Conditions to be addressed/monitored: HTN and prediabetes  Care Plan : Social  Work Care Plan  Updates made by Daneen Schick since 06/15/2020 12:00 AM    Problem: Care Coordination     Long-Range Goal: Collaborate with RN Care Manager to perform appropriate assessments to assist with care coordination needs   Start Date: 04/05/2020  Expected End Date: 08/03/2020  This Visit's Progress: On track  Recent Progress: On track  Priority: Low  Note:   Current Barriers:  . Chronic disease  management support and education needs related to HTN and sleep apnea    Social Worker Clinical Goal(s):  Marland Kitchen Over the next 120 days, patient will work with SW to identify and address any acute and/or chronic care coordination needs related to the self health management of HTN and sleep apnea    CCM SW Interventions:  . Inter-disciplinary care team collaboration (see longitudinal plan of care) . Collaboration with [name], FNP regarding development and update of comprehensive plan of care as evidenced by provider attestation and co-signature . Successful outbound call placed to the patient to assist with care coordination needs . Determined there are no SDoH acute needs identified at this time . Performed chart review to note two referrals placed in December 2021 closed due to inability to contact the patient . Discussed the patient got busy with holidays, then the passing of her brother in law in January 2022 which led to acute depression in her sister. The patient indicates she spent a lot of time caring for her sister and did not take time to follow up on her own health care needs . Determined the patient is still interested in a referral to [facility] as well as a referral for a colonoscopy . Identified the patient primary care provider, [name] FNP as referring provider for colonoscopy . Collaboration with [name] FNP to request a new referral for colonoscopy be places . Identified the patient was referred to [facility] by her cardiology office . Discussed the patient would also like to re-engage with Lorel Monaco, Pharmacist at [facility] re: HTN management . Collaboration with Karlene Einstein [place] to request new referral for [facility] as well as outreach to the patient as requested . Scheduled follow up over the next 30 days  Patient Goals/Self-Care Activities . Over the next 90 days, patient will:   - Patient will self administer  medications as prescribed Patient will attend all scheduled provider appointments Patient will call provider office for new concerns or questions Contact SW as needed prior to next scheduled call Engage with providers regarding referrals as needed  Follow Up Plan:  SW will follow up with the patient over the next 30 days       Follow Up Plan: SW will follow up with patient by phone over the next month.      Daneen Schick, BSW, CDP Social Worker, Certified Dementia Practitioner [place] [facility] [place] [facility] [phone]  Total time spent performing care coordination and/or care management activities with the patient by phone or face to face = 35 minutes.

## 2020-06-15 NOTE — Telephone Encounter (Signed)
Patient returned to town and requested appointment in HTN clinic. Appointment scheduled for March 29th at 11:30AM.

## 2020-06-21 NOTE — Progress Notes (Signed)
Patient ID: Brandi [name]                 DOB: 30-Nov-1955                      MRN: [id number]     HPI: Brandi [name] a 65 y.o.femalereferred by Dr. Corey Skains HTN clinic. PMH is significant for severe hypertension, hypertensive heart disease, congestive heart failure(EF 55-60% in 2020), obstructive sleep apnea, multiple medication intolerances, significant bilateral lower extremity edema, and morbid obesity, prediabetes (A1c 6.1%). Pt seen by PCP on 05/25/20 and BP was 118/72. Pt reported recent death in family in 2020/04/17 and BP has been variable up to 140/110. Reported running out of bystolic and self-increasing amlodipine to 10 mg daily instead of 5 mg daily.  Patient presents today in good spirits ambulating with a cane. Reports medication adherence with amlodipine 5 mg daily, spironolactone 50 mg daily, and Bystolic 20 mg daily. Reports need to avoid amlodipine 10 mg dose due to history of worsening LE edema and avoid spironolactone 100 mg dose due to possible rash. Denies dizziness, headaches, blurred vision and LE swelling. For the last two weeks, home BP ranged 125 to low 130s/85-90s with a low of 93/70 and highs of 142/91 and 141/98.  Current HTN meds: Amlodipine 5 mg daily(PM) Spironolactone50mg  daily(AM) Bystolic 20 mg daily (AM)  Previously tried:hydralazine (itching), Aliskiren (hives), Lasix(rash and extreme dehydration), carvedilol and metoprolol (hives), propranolol (vomiting)   BP goal:<130/80 mmHg  Family History:Diabetes in her father; Heart disease in her father and mother; Hypertension in her mother and sister; Kidney disease in her father.  Social History:denies tobacco use; drinks alcohol rarely  Diet:plans to work with weight loss clinic, avoiding salt   Exercise:limited due to cane but plans to work with weight loss clinic:   Home BP readings:  05/25/20 - 06/02/20: 152/104, 140/90, 139/93, 149/100, 141/100, 140/95, 136/90, 147/99 06/03/20 - 06/13/20  : 123/97, 139/83, 125/85, 142/91, 127/86, 117/86, 131/88, 127/91, 130/86, 131/90 06/14/20 - 06/22/20: 106/88, 122/85, 118/45, 124/88, 133/94, 127/95, 93/70, 128/90, 141/98   Wt Readings from Last 3 Encounters:  05/25/20 287 lb 12.8 oz (130.5 kg)  03/11/20 292 lb 8.8 oz (132.7 kg)  03/11/20 292 lb 9.6 oz (132.7 kg)   BP Readings from Last 3 Encounters:  06/22/20 122/86  05/25/20 118/72  04/06/20 (!) 192/108   Pulse Readings from Last 3 Encounters:  06/22/20 83  05/25/20 (!) 109  04/06/20 93    Renal function: CrCl cannot be calculated (Patient's most recent lab result is older than the maximum 21 days allowed.).  Past Medical History:  Diagnosis Date  . Arthritis   . Hypertension   . Hypertensive crisis   . Osteoporosis   . Rheumatoid arthritis ([place])   . Sleep apnea     Current Outpatient Medications on File Prior to Visit  Medication Sig Dispense Refill  . amLODipine (NORVASC) 5 MG tablet Take 1 tablet (5 mg total) by mouth daily. 90 tablet 1  . atorvastatin (LIPITOR) 40 MG tablet Take one tablet by mouth once daily 90 tablet 3  . Biotin 1000 MCG tablet Take 1,000 mcg by mouth daily.    Marland Kitchen BYSTOLIC 20 MG TABS Take 1 tablet by mouth once daily 90 tablet 3  . cetirizine (ZYRTEC) 10 MG tablet Take 10 mg by mouth daily.    . Cholecalciferol (VITAMIN D) 50 MCG (2000 UT) CAPS Take 1 capsule by mouth daily.    . cyclobenzaprine (FLEXERIL)  10 MG tablet TAKE 1 TABLET(10 MG) BY MOUTH THREE TIMES DAILY 270 tablet 1  . fluticasone (FLONASE) 50 MCG/ACT nasal spray Flonase Allergy Relief 50 mcg/actuation nasal spray,suspension  INSTILL 2 SPRAYS IN EACH NOSTRIL QD    . hydrocortisone cream 0.5 % 1 application as needed.     . Magnesium 500 MG TABS Take 1 tablet by mouth daily.    Marland Kitchen omeprazole (PRILOSEC) 20 MG capsule Take 20 mg by mouth daily.    Marland Kitchen PRESCRIPTION MEDICATION STRENGTIA 30MG  BID    . PRESCRIPTION MEDICATION HEPATO-CI BID    . PRESCRIPTION MEDICATION OMEGA CO3-SE BID    .  PRESCRIPTION MEDICATION b12 GD ONE DROPPER    . PRESCRIPTION MEDICATION GLYSEN BID    . spironolactone (ALDACTONE) 50 MG tablet Take 1 tablet (50 mg total) by mouth daily. 90 tablet 3  . topiramate (TOPAMAX) 50 MG tablet Take 1 tablet (50 mg total) by mouth 2 (two) times daily. Start 50 mg once daily at bedtime x 1 week and then twice daily 60 tablet 2  . TRIAMCINOLONE ACETONIDE, TOP, 0.05 % OINT Apply 1 application topically as needed.    . Vitamin E 180 MG CAPS Take 1 capsule by mouth daily.     No current facility-administered medications on file prior to visit.    Allergies  Allergen Reactions  . Hydralazine Itching  . Meperidine Hcl Other (See Comments)  . Aliskiren Hives  . Aspirin Nausea Only  . Latex     hives  . Oxycodone-Acetaminophen Itching  . Rofecoxib Diarrhea  . Spironolactone Other (See Comments)  . Furosemide Other (See Comments)    Rash and extreme dehydration  . Sulfa Antibiotics Rash     Assessment/Plan:  1. Hypertension - SBP at goal <130/80 mmHg, however diastolic BP elevated. Medication adherence appears optimal with HTN medications. BP has improved since family member's death and returning home. Discussed benefits of weight loss for BP management (see below). Also, discussed possible need for additional management if diastolic BP remains elevated. Patient verbalized understanding. Continued amlodipine 5 mg daily, spironolactone50mg  daily, and Bystolic 20 mg daily. Will consider valsartan at next visit if additional therapy is warranted. Follow-up visit scheduled in 3 weeks.  2. Weight loss -Referral sent to the [facility] weight [facility] wellness clinic. Wegovy not on formulary for Medicare patients, however discussed clinical benefits of Ozempic (semaglutide) given history of prediabetes and BMI ~50. Patient requested to discuss with "diabetes education doctor" at appointment on 04/04. Patient will like to revisit initiation in 3 weeks.   Lorel Monaco, PharmD,  [facility] [address], [place], [place] [address] Phone: [phone]; Fax: [phone]

## 2020-06-22 ENCOUNTER — Encounter: Payer: Self-pay | Admitting: Pharmacist

## 2020-06-22 ENCOUNTER — Other Ambulatory Visit: Payer: Self-pay

## 2020-06-22 ENCOUNTER — Ambulatory Visit (INDEPENDENT_AMBULATORY_CARE_PROVIDER_SITE_OTHER): Payer: Medicare Other | Admitting: Pharmacist

## 2020-06-22 DIAGNOSIS — I10 Essential (primary) hypertension: Secondary | ICD-10-CM

## 2020-06-22 NOTE — Patient Instructions (Addendum)
It was nice to see you today!  Your goal blood pressure is <130/54mmHg. In clinic, your blood pressure was 122/86 mmHg.  Medication Changes:  Continue amlodipine 5 mg daily  Continue spironolactone 50 mg daily  Continue Bystolic 20 mg daily  Talk to your primary care physician about Ozempic (semaglutide) for its weight loss benefits.   Monitor blood pressure at home daily and keep a log (on your phone or piece of paper) to bring with you to your next visit. Write down date, time, blood pressure and pulse.  Keep up the good work with diet and exercise. Aim for a diet full of vegetables, fruit and lean meats (chicken, [place], fish). Try to limit salt intake by eating fresh or frozen vegetables (instead of canned), rinse canned vegetables prior to cooking and do not add any additional salt to meals.   Please give [name] a call at [phone] with any questions or concerns.

## 2020-06-23 NOTE — Progress Notes (Signed)
[facility] [address] [place]. [place] [address] [phone]       OFFICE FOLLOW UP VISITNOTE  [name] Date of Birth:  [birth year] Medical Record Number:  [id number]   Referring MD: Sherley Bounds Reason for Referral: Headaches  Chief Complaint  Patient presents with  . Follow-up    Rm 14 alone Pt states headaches have improved, haven't had any so far.      HPI:   Today, 06/23/2020, Ms. Dejoy returns for 19-month follow-up regarding chronic daily headaches and history of postconcussive syndrome  Since prior visit, headaches have greatly improved - she is not able to say when her last headache occurred  Currently on topiramate 50 mg twice daily - tolerating without side effects  Does report right hand numbness in the distribution of the median nerve that is typically worse upon awakening in the morning and worsens with certain activities. Reports prior dx of carpal tunnel " many years ago" and used to wear brace.  Denies any weakness or difficulty with right hand function.  No further concerns at this time     History provided for reference purposes only Update 02/25/2020 Dr. Leonie Man: She returns for follow-up after last visit more than a year ago.  She states memory and cognitive difficulties have improved.  She however still has almost daily headaches.  She describes headaches been fluctuating in severity from mild to severe but does not last long.  She takes 1 tablet of Tylenol which seems to work somewhat but the headache returns.  She denies any nausea vomiting light or sound sensitivity.  She is ableshe thinks has helped but she has not tried a higher dose.  She also takes gabapentin for sciatica but takes it only once a day as well as Requip 1 mg twice daily for restless legs which have not been bothering her recently.  She has no new complaints.  Update 12/04/2018 Dr. Leonie Man: She returns for follow-up after last visit 2 months ago.  She is  accompanied by her daughter.  Patient states she is doing better.  She had only one fall recently when she felt dizzy and off-balance.  She uses a cane most of the time now and is careful when she gets up and turns.  She had MRI scan of the brain done on 10/31/2018 which was fairly unremarkable except for incidental empty sella.  EEG done on 10/09/2018 was normal.  Vitamin B12, TSH and RPR were normal on 09/26/2018.  Patient states Topamax that has started is helping hand pain and she is able to grip objects better she is not dropping them as much.  She feels her memory difficulties are unchanged.  She has however not been participating in activities for regular stress laxation.  She complains of dizziness but has been taking both Flexeril and gabapentin 3 times daily and she is willing to reduce them as she feels she does not need them as much now.  Initial visit 09/26/2018 Dr. Leonie Man Ms. Marchese is a 65 year old pleasant Caucasian lady who is seen today for initial consultation visit.  She is accompanied by her daughter.  History is obtained from her and review of referral notes.  I have reviewed imaging films in PACS.  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  She states she fell without warm warning face forward and sustained bruises on her forehead and face.  She states she did not lose consciousness  but daughter of states that she may have briefly lost it.  Patient was seen in the ER in the hospital where apparently brain scan was unremarkable however I do not have those hospital records for my review today.  Since then she has had constant headaches, tremors of her hands, short-term memory difficulties.  She states her balance is poor.  She has been using a walker at times and she can use a cane also and she wants to.  She is also been dropping objects from her hand intermittently.  She also complains of some occasional word finding difficulties and states her speech  is slow.  This symptoms have continued and have not gotten better.  She had a CT scan of the head ordered by Dr. Ronnald Ramp on 09/18/2018 which I personally reviewed shows no acute abnormality.  Patient states she was seeing a neurologist in [place] but I do not have those records either.  She does not remember having an MRI scan done.  She has also chronic back pain and sciatica and she takes Flexeril 10 mg 3 times daily, gabapentin 300 mg 3 times daily and tramadol as needed.  She also takes Requip 1 mg at night for restless legs.  Patient denies any significant increasing headaches, focal extremity weakness, stroke, TIA, seizures. to work despite the headache and sleeps well at night.  She has been taking Topamax 25 mg once a day which      ROS:   14 system review of systems is positive for those listed in HPI and all other systems negative  PMH:  Past Medical History:  Diagnosis Date  . Arthritis   . Hypertension   . Hypertensive crisis   . Osteoporosis   . Rheumatoid arthritis ([place])   . Sleep apnea     Social History:  Social History   Socioeconomic History  . Marital status: Single    Spouse name: Not on file  . Number of children: Not on file  . Years of education: Not on file  . Highest education level: Not on file  Occupational History  . Not on file  Tobacco Use  . Smoking status: Never Smoker  . Smokeless tobacco: Never Used  Vaping Use  . Vaping Use: Never used  Substance and Sexual Activity  . Alcohol use: Not Currently  . Drug use: Not Currently  . Sexual activity: Not on file  Other Topics Concern  . Not on file  Social History Narrative   Lives alone   Right handed   Drinks no caffeine   Social Determinants of Health   Financial Resource Strain: Low Risk   . Difficulty of Paying Living Expenses: Not hard at all  Food Insecurity: No Food Insecurity  . Worried About Charity fundraiser in the Last Year: Never true  . Ran Out of Food in the Last  Year: Never true  Transportation Needs: No Transportation Needs  . Lack of Transportation (Medical): No  . Lack of Transportation (Non-Medical): No  Physical Activity: Inactive  . Days of Exercise per Week: 0 days  . Minutes of Exercise per Session: 0 min  Stress: No Stress Concern Present  . Feeling of Stress : Not at all  Social Connections: Not on file  Intimate Partner Violence: Not on file    Medications:   Current Outpatient Medications on File Prior to Visit  Medication Sig Dispense Refill  . amLODipine (NORVASC) 5 MG tablet Take 1 tablet (5 mg total) by  mouth daily. 90 tablet 1  . atorvastatin (LIPITOR) 40 MG tablet Take one tablet by mouth once daily 90 tablet 3  . Biotin 1000 MCG tablet Take 1,000 mcg by mouth daily.    Marland Kitchen BYSTOLIC 20 MG TABS Take 1 tablet by mouth once daily 90 tablet 3  . cetirizine (ZYRTEC) 10 MG tablet Take 10 mg by mouth daily.    . Cholecalciferol (VITAMIN D) 50 MCG (2000 UT) CAPS Take 1 capsule by mouth daily.    . cyclobenzaprine (FLEXERIL) 10 MG tablet TAKE 1 TABLET(10 MG) BY MOUTH THREE TIMES DAILY 270 tablet 1  . fluticasone (FLONASE) 50 MCG/ACT nasal spray Flonase Allergy Relief 50 mcg/actuation nasal spray,suspension  INSTILL 2 SPRAYS IN EACH NOSTRIL QD    . hydrocortisone cream 0.5 % 1 application as needed.     . Magnesium 500 MG TABS Take 1 tablet by mouth daily.    Marland Kitchen omeprazole (PRILOSEC) 20 MG capsule Take 20 mg by mouth daily.    Marland Kitchen PRESCRIPTION MEDICATION STRENGTIA 30MG  BID    . PRESCRIPTION MEDICATION HEPATO-CI BID    . PRESCRIPTION MEDICATION OMEGA CO3-SE BID    . PRESCRIPTION MEDICATION b12 GD ONE DROPPER    . PRESCRIPTION MEDICATION GLYSEN BID    . spironolactone (ALDACTONE) 50 MG tablet Take 1 tablet (50 mg total) by mouth daily. 90 tablet 3  . TRIAMCINOLONE ACETONIDE, TOP, 0.05 % OINT Apply 1 application topically as needed.    . Vitamin E 180 MG CAPS Take 1 capsule by mouth daily.     No current facility-administered medications  on file prior to visit.    Allergies:   Allergies  Allergen Reactions  . Hydralazine Itching  . Meperidine Hcl Other (See Comments)  . Aliskiren Hives  . Aspirin Nausea Only  . Chocolate   . Juniper Berries   . Latex     hives  . Oxycodone-Acetaminophen Itching  . Rofecoxib Diarrhea  . Spironolactone Other (See Comments)  . Furosemide Other (See Comments)    Rash and extreme dehydration  . Sulfa Antibiotics Rash    Physical Exam Today's Vitals   06/24/20 1345  BP: (!) 147/92  Pulse: 81  Weight: 290 lb (131.5 kg)  Height: 5\' 4"  (1.626 m)   Body mass index is 49.78 kg/m.  General: Morbidly obese very pleasant middle-aged Caucasian lady, seated, in no evident distress Head: head normocephalic and atraumatic.   Neck: supple with no carotid or supraclavicular bruits Cardiovascular: regular rate and rhythm, no murmurs Musculoskeletal: no deformity Skin:  no rash/petichiae Vascular:  Normal pulses all extremities  Neurologic Exam Mental Status: Awake and fully alert. Oriented to place and time. Recent and remote memory intact. Attention span, concentration and fund of knowledge appropriate. Mood and affect appropriate.   Cranial Nerves:  pupils equal, briskly reactive to light. Extraocular movements full without nystagmus. Visual fields full to confrontation. Hearing intact. Facial sensation intact. Face, tongue, palate moves normally and symmetrically.  Motor: Normal bulk and tone. Normal strength in all tested extremity muscles. + Tinel's and Phalen test Sensory.: intact to touch , pinprick , position and vibratory sensation.  Coordination: Rapid alternating movements normal in all extremities. Finger-to-nose and heel-to-shin performed accurately bilaterally. Gait and Station: Arises from chair without difficulty.  Uses a cane.  Stance is normal. Gait demonstrates broad-based gait with normal stride length and balance and use of cane. heel, toe and tandem walk not  attempted Reflexes: 1+ and symmetric except ankle jerks are depressed bilaterally. Toes  downgoing.       ASSESSMENT/PLAN: 65 year old Caucasian lady with 2-year history of postconcussive syndrome with chronic persistent daily headache as well as likely right carpal tunnel syndrome    Chronic daily headache -Greatly improved without any recent headaches -Continue topiramate 50 mg twice daily -refill provided  Carpal tunnel syndrome, right -Positive Tinel's and Phalen's testing -Symptoms consistent with carpal tunnel -as symptoms intermittent and not severe, recommend conservative management at this time with use of braces and avoidance of repetitive motions which aggravate symptoms.  Advised if symptoms worsen or interfere with daily activity or functioning, may proceed with further evaluation such as EMG/NCV    Follow-up in 6 months or call earlier if needed   CC:  [facility] provider: Dr. Baron Sane, Doreene Burke, FNP    I spent 25 minutes of face-to-face and non-face-to-face time with patient.  This included previsit chart review, lab review, study review, order entry, electronic health record documentation, patient education and discussion regarding history of chronic daily headaches and use of topiramate, carpal tunnel syndrome and further education and answered all other questions to patient satisfaction  Frann Rider, AGNP-BC  [facility] [address] [place], [place] [address]  Phone [phone] Fax [phone] Note: This document was prepared with digital dictation and possible smart phrase technology. Any transcriptional errors that result from this process are unintentional.

## 2020-06-24 ENCOUNTER — Ambulatory Visit (INDEPENDENT_AMBULATORY_CARE_PROVIDER_SITE_OTHER): Payer: Medicare Other | Admitting: Adult Health

## 2020-06-24 ENCOUNTER — Encounter: Payer: Self-pay | Admitting: Adult Health

## 2020-06-24 ENCOUNTER — Telehealth: Payer: Self-pay

## 2020-06-24 VITALS — BP 147/92 | HR 81 | Ht 64.0 in | Wt 290.0 lb

## 2020-06-24 DIAGNOSIS — R519 Headache, unspecified: Secondary | ICD-10-CM

## 2020-06-24 DIAGNOSIS — G5601 Carpal tunnel syndrome, right upper limb: Secondary | ICD-10-CM | POA: Diagnosis not present

## 2020-06-24 MED ORDER — TOPIRAMATE 50 MG PO TABS: 50.0000 mg | ORAL_TABLET | Freq: Two times a day (BID) | ORAL | 3 refills | Status: DC

## 2020-06-24 NOTE — Patient Instructions (Signed)
Your Plan:  Continue Topamax 50 mg twice daily - refill provided  Your right hand numbness is likely in setting of carpal tunnel syndrome - recommend currently taking more of a conservative approach with use of braces and avoid motions that can worsen symptoms If symptoms worsen or start to interfere with daily activity or functioning, would recommend then proceeding with further evaluation   Follow-up in 6 months or call earlier if needed     Thank you for coming to see [name] at [facility]. I hope we have been able to provide you high quality care today.  You may receive a patient satisfaction survey over the next few weeks. We would appreciate your feedback and comments so that we may continue to improve ourselves and the health of our patients.     Carpal Tunnel Syndrome  Carpal tunnel syndrome is a condition that causes pain, weakness, and numbness in your hand and arm. Numbness is when you cannot feel an area in your body. The carpal tunnel is a narrow area that is on the palm side of your wrist. Repeated wrist motion or certain diseases may cause swelling in the tunnel. This swelling can pinch the main nerve in the wrist. This nerve is called the median nerve. What are the causes? This condition may be caused by:  Moving your hand and wrist over and over again while doing a task.  Injury to the wrist.  Arthritis.  A sac of fluid (cyst) or abnormal growth (tumor) in the carpal tunnel.  Fluid buildup during pregnancy.  Use of tools that vibrate. Sometimes the cause is not known. What increases the risk? The following factors may make you more likely to have this condition:  Having a job that makes you do these things: ? Move your hand over and over again. ? Work with tools that vibrate, such as drills or sanders.  Being a woman.  Having diabetes, obesity, thyroid problems, or kidney failure. What are the signs or symptoms? Symptoms of this  condition include:  A tingling feeling in your fingers.  Tingling or loss of feeling in your hand.  Pain in your entire arm. This pain may get worse when you bend your wrist and elbow for a long time.  Pain in your wrist that goes up your arm to your shoulder.  Pain that goes down into your palm or fingers.  Weakness in your hands. You may find it hard to grab and hold items. You may feel worse at night. How is this treated? This condition may be treated with:  Lifestyle changes. You will be asked to stop or change the activity that caused your problem.  Doing exercises and activities that make bones, muscles, and tendons stronger (physical therapy).  Learning how to use your hand again (occupational therapy).  Medicines for pain and swelling. You may have injections in your wrist.  A wrist splint or brace.  Surgery. Follow these instructions at home: If you have a splint or brace:  Wear the splint or brace as told by your doctor. Take it off only as told by your doctor.  Loosen the splint if your fingers: ? Tingle. ? Become numb. ? Turn cold and blue.  Keep the splint or brace clean.  If the splint or brace is not waterproof: ? Do not let it get wet. ? Cover it with a watertight covering when you take a bath or a shower. Managing pain, stiffness, and swelling If told, put ice  on the painful area:  If you have a removable splint or brace, remove it as told by your doctor.  Put ice in a plastic bag.  Place a towel between your skin and the bag.  Leave the ice on for 20 minutes, 2-3 times per day. Do not fall asleep with the cold pack on your skin.  Take off the ice if your skin turns bright red. This is very important. If you cannot feel pain, heat, or cold, you have a greater risk of damage to the area. Move your fingers often to reduce stiffness and swelling.   General instructions  Take over-the-counter and prescription medicines only as told by your  doctor.  Rest your wrist from any activity that may cause pain. If needed, talk with your boss at work about changes that can help your wrist heal.  Do exercises as told by your doctor, physical therapist, or occupational therapist.  Keep all follow-up visits. Contact a doctor if:  You have new symptoms.  Medicine does not help your pain.  Your symptoms get worse. Get help right away if:  You have very bad numbness or tingling in your wrist or hand. Summary  Carpal tunnel syndrome is a condition that causes pain in your hand and arm.  It is often caused by repeated wrist motions.  Lifestyle changes and medicines are used to treat this problem. Surgery may help in very bad cases.  Follow your doctor's instructions about wearing a splint, resting your wrist, keeping follow-up visits, and calling for help. This information is not intended to replace advice given to you by your health care provider. Make sure you discuss any questions you have with your health care provider. Document Revised: 07/24/2019 Document Reviewed: 07/24/2019 Elsevier Patient Education  [place].

## 2020-06-24 NOTE — Progress Notes (Signed)
I agree with the above plan 

## 2020-06-24 NOTE — Telephone Encounter (Signed)
  Chronic Care Management   Outreach Note  06/24/2020 Name: Brandi [name] MRN: [id number] DOB: [birth year]  Referred by: [name], FNP Reason for referral : Chronic Care Management (Unsuccessful call)   SW received in basket message from patients primary provider requesting the patient follow up on close referral to gastroenterology as referral was placed less than 6 months ago and should be able to be reopened. SW placed an unsuccessful outbound call to the patient to follow up with her regarding desire for colonoscopy referral. SW left a HIPAA compliant voice message requesting a return call.  Follow Up Plan: SW will follow up with the patient over the next month.  Daneen Schick, BSW, CDP Social Worker, Certified Dementia Practitioner [place] [facility] [place] [facility] [phone]

## 2020-06-30 ENCOUNTER — Ambulatory Visit (INDEPENDENT_AMBULATORY_CARE_PROVIDER_SITE_OTHER): Payer: Medicare Other | Admitting: Nurse Practitioner

## 2020-06-30 ENCOUNTER — Other Ambulatory Visit: Payer: Self-pay

## 2020-06-30 ENCOUNTER — Encounter: Payer: Self-pay | Admitting: Nurse Practitioner

## 2020-06-30 VITALS — BP 122/80 | HR 96 | Temp 98.4°F | Ht 64.0 in | Wt 289.8 lb

## 2020-06-30 DIAGNOSIS — I10 Essential (primary) hypertension: Secondary | ICD-10-CM

## 2020-06-30 DIAGNOSIS — R7303 Prediabetes: Secondary | ICD-10-CM | POA: Diagnosis not present

## 2020-06-30 NOTE — Progress Notes (Signed)
I,Brandi [name] [employer] as a Education administrator for [employer], FNP.,have documented all relevant documentation on the behalf of [name], FNP,as directed by  [name], FNP while in the presence of [name], [name]. This visit occurred during the SARS-CoV-2 public health emergency.  Safety protocols were in place, including screening questions prior to the visit, additional usage of staff PPE, and extensive cleaning of exam room while observing appropriate contact time as indicated for disinfecting solutions.  Subjective:     Patient ID: Brandi [name] , female    DOB: 1955/08/29 , 65 y.o.   MRN: [id number]   Chief Complaint  Patient presents with  . Hypertension  . Prediabetes    HPI  Patient presents today for a f/u on her blood pressure. She has not been back to [place].  She is wearing a CPAP at night and sleeps well. She had a salad for dinner last night with tuna    She has been having right wrist numbness and tingling, thought to be related to carpal tunnel and she is not interested in surgery.   Wt Readings from Last 3 Encounters: 06/30/20 : 289 lb 12.8 oz (131.5 kg) 06/24/20 : 290 lb (131.5 kg) 05/25/20 : 287 lb 12.8 oz (130.5 kg)    Hypertension This is a chronic problem. The current episode started more than 1 year ago. The problem is uncontrolled. Pertinent negatives include no anxiety, blurred vision, chest pain, headaches or palpitations. Risk factors for coronary artery disease include obesity, dyslipidemia and sedentary lifestyle. Past treatments include calcium channel blockers. There are no compliance problems.  There is no history of angina. There is no history of chronic renal disease.  Diabetes She presents for her follow-up diabetic visit. She has type 2 diabetes mellitus. Her disease course has been stable. Pertinent negatives for hypoglycemia include no dizziness or headaches. Pertinent negatives for diabetes include no blurred vision, no chest pain, no  fatigue, no polydipsia, no polyphagia and no polyuria. Symptoms are stable. There are no diabetic complications. Risk factors for coronary artery disease include dyslipidemia, obesity and sedentary lifestyle. Current diabetic treatment includes diet. She is compliant with treatment all of the time. Home blood sugar record trend: fluctuating. (Range 107- 158 )     Past Medical History:  Diagnosis Date  . Arthritis   . Hypertension   . Hypertensive crisis   . Osteoporosis   . Rheumatoid arthritis ([place])   . Sleep apnea      Family History  Problem Relation Age of Onset  . Hypertension Mother   . Cancer Mother   . Heart disease Mother   . Kidney disease Father   . Heart disease Father   . Diabetes Father   . Hypertension Sister      Current Outpatient Medications:  .  amLODipine (NORVASC) 5 MG tablet, Take 1 tablet (5 mg total) by mouth daily., Disp: 90 tablet, Rfl: 1 .  atorvastatin (LIPITOR) 40 MG tablet, Take one tablet by mouth once daily, Disp: 90 tablet, Rfl: 3 .  Biotin 1000 MCG tablet, Take 1,000 mcg by mouth daily., Disp: , Rfl:  .  BYSTOLIC 20 MG TABS, Take 1 tablet by mouth once daily, Disp: 90 tablet, Rfl: 3 .  cetirizine (ZYRTEC) 10 MG tablet, Take 10 mg by mouth daily., Disp: , Rfl:  .  Cholecalciferol (VITAMIN D) 50 MCG (2000 UT) CAPS, Take 1 capsule by mouth daily., Disp: , Rfl:  .  cyclobenzaprine (FLEXERIL) 10 MG tablet, TAKE 1 TABLET(10 MG)  BY MOUTH THREE TIMES DAILY, Disp: 270 tablet, Rfl: 1 .  fluticasone (FLONASE) 50 MCG/ACT nasal spray, Flonase Allergy Relief 50 mcg/actuation nasal spray,suspension  INSTILL 2 SPRAYS IN EACH NOSTRIL QD, Disp: , Rfl:  .  hydrocortisone cream 0.5 %, 1 application as needed. , Disp: , Rfl:  .  Magnesium 500 MG TABS, Take 1 tablet by mouth daily., Disp: , Rfl:  .  omeprazole (PRILOSEC) 20 MG capsule, Take 20 mg by mouth daily., Disp: , Rfl:  .  PRESCRIPTION MEDICATION, STRENGTIA 30MG BID, Disp: , Rfl:  .  PRESCRIPTION MEDICATION,  HEPATO-CI BID, Disp: , Rfl:  .  PRESCRIPTION MEDICATION, OMEGA CO3-SE BID, Disp: , Rfl:  .  PRESCRIPTION MEDICATION, b12 GD ONE DROPPER, Disp: , Rfl:  .  PRESCRIPTION MEDICATION, GLYSEN BID, Disp: , Rfl:  .  spironolactone (ALDACTONE) 50 MG tablet, Take 1 tablet (50 mg total) by mouth daily., Disp: 90 tablet, Rfl: 3 .  topiramate (TOPAMAX) 50 MG tablet, Take 1 tablet (50 mg total) by mouth 2 (two) times daily., Disp: 180 tablet, Rfl: 3 .  TRIAMCINOLONE ACETONIDE, TOP, 0.05 % OINT, Apply 1 application topically as needed., Disp: , Rfl:  .  Vitamin E 180 MG CAPS, Take 1 capsule by mouth daily., Disp: , Rfl:  .  Semaglutide,0.25 or 0.5MG/DOS, (OZEMPIC, 0.25 OR 0.5 MG/DOSE,) 2 MG/1.5ML SOPN, Inject 0.25 mg weekly x4 weeks, then increase to 0.5 mg weekly, Disp: 3 mL, Rfl: 1 .  valsartan (DIOVAN) 80 MG tablet, Take 1 tablet (80 mg total) by mouth daily., Disp: 30 tablet, Rfl: 3   Allergies  Allergen Reactions  . Hydralazine Itching  . Meperidine Hcl Other (See Comments)  . Aliskiren Hives  . Aspirin Nausea Only  . Chocolate   . Juniper Berries   . Latex     hives  . Oxycodone-Acetaminophen Itching  . Rofecoxib Diarrhea  . Spironolactone Other (See Comments)  . Furosemide Other (See Comments)    Rash and extreme dehydration  . Sulfa Antibiotics Rash     Review of Systems  Constitutional: Negative for fatigue.  Eyes: Negative for blurred vision.  Respiratory: Negative.   Cardiovascular: Negative for chest pain, palpitations and leg swelling.  Endocrine: Negative for polydipsia, polyphagia and polyuria.  Neurological: Negative for dizziness and headaches.  Psychiatric/Behavioral: Negative.      Today's Vitals   06/30/20 1111  BP: 122/80  Pulse: 96  Temp: 98.4 F (36.9 C)  TempSrc: Oral  Weight: 289 lb 12.8 oz (131.5 kg)  Height: '5\' 4"'  (1.626 m)  PainSc: 0-No pain   Body mass index is 49.74 kg/m.   Objective:  Physical Exam Constitutional:      General: She is not in  acute distress.    Appearance: Normal appearance. She is obese.  Eyes:     Pupils: Pupils are equal, round, and reactive to light.  Cardiovascular:     Rate and Rhythm: Normal rate and regular rhythm.     Pulses: Normal pulses.     Heart sounds: Normal heart sounds. No murmur heard.   Pulmonary:     Effort: Pulmonary effort is normal. No respiratory distress.     Breath sounds: Normal breath sounds. No wheezing.  Musculoskeletal:     Right lower leg: No edema.     Left lower leg: No edema.  Skin:    General: Skin is warm and dry.     Capillary Refill: Capillary refill takes less than 2 seconds.  Neurological:  General: No focal deficit present.     Mental Status: She is alert and oriented to person, place, and time.     Cranial Nerves: No cranial nerve deficit.     Motor: No weakness.  Psychiatric:        Mood and Affect: Mood normal.        Behavior: Behavior normal.        Thought Content: Thought content normal.        Judgment: Judgment normal.         Assessment And Plan:     1. Essential hypertension  Chronic, blood pressure is controlled  She is not under as much stress - BMP8+eGFR  2. Prediabetes  Chronic, stable  Will check hgbA1c  Continue to avoid sugary foods and drinks - BMP8+eGFR - Hemoglobin A1c     Patient was given opportunity to ask questions. Patient verbalized understanding of the plan and was able to repeat key elements of the plan. All questions were answered to their satisfaction.  [name], FNP   I, [name], FNP, have reviewed all documentation for this visit. The documentation on 06/30/20 for the exam, diagnosis, procedures, and orders are all accurate and complete.   IF YOU HAVE BEEN REFERRED TO A SPECIALIST, IT MAY TAKE 1-2 WEEKS TO SCHEDULE/PROCESS THE REFERRAL. IF YOU HAVE NOT HEARD FROM US/SPECIALIST IN TWO WEEKS, PLEASE GIVE [name] A CALL AT [phone].   THE PATIENT IS ENCOURAGED TO PRACTICE SOCIAL DISTANCING  DUE TO THE COVID-19 PANDEMIC.

## 2020-06-30 NOTE — Patient Instructions (Signed)

## 2020-07-01 LAB — BMP8+EGFR
BUN/Creatinine Ratio: 13 (ref 12–28)
BUN: 10 mg/dL (ref 8–27)
CO2: 19 mmol/L — ABNORMAL LOW (ref 20–29)
Calcium: 10.1 mg/dL (ref 8.7–10.3)
Chloride: 105 mmol/L (ref 96–106)
Creatinine, Ser: 0.8 mg/dL (ref 0.57–1.00)
Glucose: 107 mg/dL — ABNORMAL HIGH (ref 65–99)
Potassium: 4.5 mmol/L (ref 3.5–5.2)
Sodium: 142 mmol/L (ref 134–144)
eGFR: 81 mL/min/{1.73_m2} (ref >59)

## 2020-07-01 LAB — HEMOGLOBIN A1C
Est. average glucose Bld gHb Est-mCnc: 126 mg/dL
Hgb A1c MFr Bld: 6 % — ABNORMAL HIGH (ref 4.8–5.6)

## 2020-07-12 NOTE — Progress Notes (Signed)
Patient ID: Brandi [name]                 DOB: 11-Jun-1955                      MRN: [id number]     HPI: Brandi [name] a 65 y.o.femalereferred by Dr. Corey Skains HTN clinic. PMH is significant for severe hypertension, hypertensive heart disease, congestive heart failure(EF 55-60% in 2020), obstructive sleep apnea, multiple medication intolerances, significant bilateral lower extremity edema, and morbid obesity, prediabetes (A1c 6.1%). Pt seen in HTN clinic on 06/22/20 and BP 122/86. Home BP ranged 125 to low 130s/85-90s. No medication changes were made. Additionally, pt seen by PCP on 06/30/20. BP 122/80 was at goal and continued on current regimen.   Patient presents today in good spirits ambulating with a cane. Reports medication adherence with HTN medications. Denies dizziness, headaches, blurred vision and LE swelling. Reports she hasn't taken morning meds yet today. Based on reported readings below, average BP 134/95. Patient expresses worry regarding elevated diastolic BP. Additionally, after previous discussion regarding weight loss management, pt reports she would like to try semaglutide.   Current HTN meds:  Amlodipine 5 mg daily(PM)  Spironolactone50mg  daily(AM)  Bystolic 20 mg daily(AM)  Previously tried:hydralazine (itching), Aliskiren (hives), Lasix(rash and extreme dehydration), carvedilol and metoprolol (hives), propranolol (vomiting)   BP goal:<130/80 mmHg  Family History:Diabetes in her father; Heart disease in her father and mother; Hypertension in her mother and sister; Kidney disease in her father.  Social History:denies tobacco use; drinks alcohol rarely  Diet:plans to work with weight loss clinic, avoiding salt   Exercise:limited due to cane but plans to work with weight loss clinic:  Home BP readings: 4/1 to 4/19  HR 77-92   SBP DBP   124 89   130 101   135 94   140 90   134 84   118 93   126 99   138 104   136 97   142 94   143 95    126 83   127  95   138 99   142 99   138 90   145 94   138 90      AVG 134.4444 93.88889   Baseline weight: 286 lbs  Wt Readings from Last 3 Encounters:  06/30/20 289 lb 12.8 oz (131.5 kg)  06/24/20 290 lb (131.5 kg)  05/25/20 287 lb 12.8 oz (130.5 kg)   BP Readings from Last 3 Encounters:  07/13/20 (!) 138/100  06/30/20 122/80  06/24/20 (!) 147/92   Pulse Readings from Last 3 Encounters:  07/13/20 94  06/30/20 96  06/24/20 81    Renal function: Estimated Creatinine Clearance: 92 mL/min (by C-G formula based on SCr of 0.8 mg/dL).  Past Medical History:  Diagnosis Date  . Arthritis   . Hypertension   . Hypertensive crisis   . Osteoporosis   . Rheumatoid arthritis ([place])   . Sleep apnea     Current Outpatient Medications on File Prior to Visit  Medication Sig Dispense Refill  . amLODipine (NORVASC) 5 MG tablet Take 1 tablet (5 mg total) by mouth daily. 90 tablet 1  . atorvastatin (LIPITOR) 40 MG tablet Take one tablet by mouth once daily 90 tablet 3  . Biotin 1000 MCG tablet Take 1,000 mcg by mouth daily.    Marland Kitchen BYSTOLIC 20 MG TABS Take 1 tablet by mouth once daily 90 tablet 3  . cetirizine (ZYRTEC) 10 MG tablet  Take 10 mg by mouth daily.    . Cholecalciferol (VITAMIN D) 50 MCG (2000 UT) CAPS Take 1 capsule by mouth daily.    . cyclobenzaprine (FLEXERIL) 10 MG tablet TAKE 1 TABLET(10 MG) BY MOUTH THREE TIMES DAILY 270 tablet 1  . fluticasone (FLONASE) 50 MCG/ACT nasal spray Flonase Allergy Relief 50 mcg/actuation nasal spray,suspension  INSTILL 2 SPRAYS IN EACH NOSTRIL QD    . hydrocortisone cream 0.5 % 1 application as needed.     . Magnesium 500 MG TABS Take 1 tablet by mouth daily.    Marland Kitchen omeprazole (PRILOSEC) 20 MG capsule Take 20 mg by mouth daily.    Marland Kitchen PRESCRIPTION MEDICATION STRENGTIA 30MG  BID    . PRESCRIPTION MEDICATION HEPATO-CI BID    . PRESCRIPTION MEDICATION OMEGA CO3-SE BID    . PRESCRIPTION MEDICATION b12 GD ONE DROPPER    . PRESCRIPTION MEDICATION  GLYSEN BID    . spironolactone (ALDACTONE) 50 MG tablet Take 1 tablet (50 mg total) by mouth daily. 90 tablet 3  . topiramate (TOPAMAX) 50 MG tablet Take 1 tablet (50 mg total) by mouth 2 (two) times daily. 180 tablet 3  . TRIAMCINOLONE ACETONIDE, TOP, 0.05 % OINT Apply 1 application topically as needed.    . Vitamin E 180 MG CAPS Take 1 capsule by mouth daily.     No current facility-administered medications on file prior to visit.    Allergies  Allergen Reactions  . Hydralazine Itching  . Meperidine Hcl Other (See Comments)  . Aliskiren Hives  . Aspirin Nausea Only  . Chocolate   . Juniper Berries   . Latex     hives  . Oxycodone-Acetaminophen Itching  . Rofecoxib Diarrhea  . Spironolactone Other (See Comments)  . Furosemide Other (See Comments)    Rash and extreme dehydration  . Sulfa Antibiotics Rash    Assessment/Plan:  1. Hypertension - Clinic BP above goal <130/80 mmHg, however did not take morning medications yet. Additionally, home diastolic BP remains elevated. Medication adherence appears optimal with HTN medications. Will start valsartan 80 mg daily. Continued amlodipine 5 mg daily, spironolactone50mg  daily, and Bystolic 20 mg daily. Follow-up BMET scheduled in 2 weeks and BP appointment scheduled in 4 weeks.    2. Weight loss/prediabetes - Patient's BMI ~49 and will benefit from additional weight loss for hypertension and prediabetes management. Wegovy not on formulary for Medicare patients, therefore will start Ozempic (semalguitide) 0.25 mg weekly x4 weeks. Discussed clinical benefits and potential side effects. Demonstrated proper injection technique with teach back method. Encouraged patient to aim for a diet full of vegetables, fruit and lean meats (chicken, [place], fish) and to limit carbs (bread, pasta, sugar, rice) and red meat consumption. Encouraged patient to exercise 20-30 minutes daily with the goal of 150 minutes per week. Patient verbalized understanding.  Follow-up appointment scheduled in 4 weeks.   Lorel Monaco, PharmD, [place] [address] [place], [place] [address] Phone: [phone]; Fax: [phone]

## 2020-07-13 ENCOUNTER — Ambulatory Visit (INDEPENDENT_AMBULATORY_CARE_PROVIDER_SITE_OTHER): Payer: Medicare Other | Admitting: Pharmacist

## 2020-07-13 ENCOUNTER — Encounter: Payer: Self-pay | Admitting: Pharmacist

## 2020-07-13 ENCOUNTER — Other Ambulatory Visit: Payer: Self-pay

## 2020-07-13 VITALS — BP 138/100 | HR 94 | Wt 286.8 lb

## 2020-07-13 DIAGNOSIS — R7301 Impaired fasting glucose: Secondary | ICD-10-CM | POA: Diagnosis not present

## 2020-07-13 DIAGNOSIS — I10 Essential (primary) hypertension: Secondary | ICD-10-CM

## 2020-07-13 MED ORDER — OZEMPIC (0.25 OR 0.5 MG/DOSE) 2 MG/1.5ML ~~LOC~~ SOPN: PEN_INJECTOR | SUBCUTANEOUS | 1 refills | Status: DC

## 2020-07-13 MED ORDER — VALSARTAN 80 MG PO TABS: 80.0000 mg | ORAL_TABLET | Freq: Every day | ORAL | 3 refills | Status: DC

## 2020-07-13 NOTE — Patient Instructions (Signed)
It was nice to see you today!  Your goal blood pressure is <130/80 mmHg.  Medication Changes: Begin taking Valsartan 80 mg daily once in the morning  Begin taking Ozempic 0.25 mg weekly x4 weeks  Continue amlodipine 5 mg daily in the evening  Continue spironolactone 50 mg daily in the morning  Continue bystolic 20 mg daily in the morning  Monitor blood pressure at home daily and keep a log (on your phone or piece of paper) to bring with you to your next visit. Write down date, time, blood pressure and pulse.  Keep up the good work with diet and exercise. Aim for a diet full of vegetables, fruit and lean meats (chicken, [place], fish). Try to limit salt intake by eating fresh or frozen vegetables (instead of canned), rinse canned vegetables prior to cooking and do not add any additional salt to meals.   Please give [name] a call at [phone] with any questions or concerns.

## 2020-07-20 ENCOUNTER — Telehealth: Payer: Federal, State, Local not specified - PPO

## 2020-07-21 ENCOUNTER — Telehealth: Payer: Self-pay

## 2020-07-21 ENCOUNTER — Telehealth: Payer: Federal, State, Local not specified - PPO

## 2020-07-21 NOTE — Telephone Encounter (Signed)
  Chronic Care Management   Outreach Note  07/21/2020 Name: Brandi [name] MRN: [id number] DOB: [birth year]  Referred by: [name], FNP Reason for referral : Chronic Care Management (Unsuccessful call)   A second unsuccessful telephone outreach was attempted today. The patient was referred to the case management team for assistance with care management and care coordination.   Follow Up Plan: A HIPAA compliant phone message was left for the patient providing contact information and requesting a return call.  The care management team will reach out to the patient again over the next 30 days.   Daneen Schick, BSW, CDP Social Worker, Certified Dementia Practitioner [facility] [phone]

## 2020-07-23 ENCOUNTER — Encounter: Payer: Self-pay | Admitting: Nurse Practitioner

## 2020-07-27 ENCOUNTER — Other Ambulatory Visit: Payer: Medicare Other

## 2020-07-30 ENCOUNTER — Other Ambulatory Visit: Payer: Medicare Other

## 2020-08-02 ENCOUNTER — Other Ambulatory Visit: Payer: Medicare Other | Admitting: *Deleted

## 2020-08-02 ENCOUNTER — Other Ambulatory Visit: Payer: Self-pay

## 2020-08-02 DIAGNOSIS — I10 Essential (primary) hypertension: Secondary | ICD-10-CM | POA: Diagnosis not present

## 2020-08-03 LAB — BASIC METABOLIC PANEL
BUN/Creatinine Ratio: 16 (ref 12–28)
BUN: 17 mg/dL (ref 8–27)
CO2: 21 mmol/L (ref 20–29)
Calcium: 10.1 mg/dL (ref 8.7–10.3)
Chloride: 105 mmol/L (ref 96–106)
Creatinine, Ser: 1.04 mg/dL — ABNORMAL HIGH (ref 0.57–1.00)
Glucose: 99 mg/dL (ref 65–99)
Potassium: 4.4 mmol/L (ref 3.5–5.2)
Sodium: 142 mmol/L (ref 134–144)
eGFR: 59 mL/min/{1.73_m2} — ABNORMAL LOW (ref >59)

## 2020-08-09 ENCOUNTER — Other Ambulatory Visit: Payer: Self-pay | Admitting: Nurse Practitioner

## 2020-08-09 DIAGNOSIS — Z1231 Encounter for screening mammogram for malignant neoplasm of breast: Secondary | ICD-10-CM

## 2020-08-09 NOTE — Progress Notes (Signed)
Patient ID: Brandi [name]                 DOB: 08/18/1955                      MRN: [id number]     HPI: Brandi [name] a 65 y.o.femalereferred by Dr. Corey Skains HTN clinic. PMH is significant for severe hypertension, hypertensive heart disease, congestive heart failure(EF 55-60% in 2020), obstructive sleep apnea, multiple medication intolerances, significant bilateral lower extremity edema, and morbid obesity, prediabetes (A1c 6.1%). Pt seen in HTN clinic on 07/13/20 and BP was 138/100, however did not taking morning medications at that time. Home BP averaged 134/95, with documented elevated diastolic readings. Pt started on valsartan 80 mg daily. Of note, on 2-week f/u BMET Scr mildly increased from 0.8 to 1.04. Potassium wnl.   Patient presents today in good spirits ambulating with a cane. Reports medication adherence with HTN medications. Denies chronic NSAID use. Reports staying hydrated with water. Reports compliance with CPAP nightly. Based on reported readings below, average BP 133/95. Patient continues to express worry regarding elevated diastolic BP. Additionally, reports 8 lb weight loss since starting Ozempic and plans to increase to 0.5 mg next Tuesday. Denies GI upset. Reports appointment with healthy weight management in June 2022.   Current HTN meds:  Valsartan 80 mg daily (AM)  Amlodipine 5 mg daily(PM)  Spironolactone50mg  daily(AM)  Bystolic 20 mg daily(AM)  Previously tried:hydralazine (itching), Aliskiren (hives), Lasix(rash and extreme dehydration), carvedilol and metoprolol (hives), propranolol (vomiting)  BP goal:<130/80 mmHg  Family History:Diabetes in her father; Heart disease in her father and mother; Hypertension in her mother and sister; Kidney disease in her father.  Social History:denies tobacco use; drinks alcohol rarely  Diet:plans to work with weight loss clinic, avoiding salt  Exercise:limited due to cane but plans to work with  weight loss clinic  Home BP readings: see below  HR: 70-90s   SBP DBP   141 99   150 113   140 90   143 90   132 91   132 93   130 89   131 98   135 98   122 96   118 98   131 92   129  98   141 99   131 99   138 98   120 98   130 89   142 88   129 90   124 94   134 91  AVG 132.8636 95.04545    Wt Readings from Last 3 Encounters:  08/10/20 278 lb (126.1 kg)  07/13/20 286 lb 12.8 oz (130.1 kg)  06/30/20 289 lb 12.8 oz (131.5 kg)   BP Readings from Last 3 Encounters:  08/10/20 134/81  07/13/20 (!) 138/100  06/30/20 122/80   Pulse Readings from Last 3 Encounters:  07/13/20 94  06/30/20 96  06/24/20 81    Renal function: Estimated Creatinine Clearance: 69 mL/min (A) (by C-G formula based on SCr of 1.04 mg/dL (H)).  Past Medical History:  Diagnosis Date  . Arthritis   . Hypertension   . Hypertensive crisis   . Osteoporosis   . Rheumatoid arthritis ([place])   . Sleep apnea     Current Outpatient Medications on File Prior to Visit  Medication Sig Dispense Refill  . atorvastatin (LIPITOR) 40 MG tablet Take one tablet by mouth once daily 90 tablet 3  . Biotin 1000 MCG tablet Take 1,000 mcg by mouth daily.    Marland Kitchen BYSTOLIC 20  MG TABS Take 1 tablet by mouth once daily 90 tablet 3  . cetirizine (ZYRTEC) 10 MG tablet Take 10 mg by mouth daily.    . Cholecalciferol (VITAMIN D) 50 MCG (2000 UT) CAPS Take 1 capsule by mouth daily.    . cyclobenzaprine (FLEXERIL) 10 MG tablet TAKE 1 TABLET(10 MG) BY MOUTH THREE TIMES DAILY 270 tablet 1  . fluticasone (FLONASE) 50 MCG/ACT nasal spray Flonase Allergy Relief 50 mcg/actuation nasal spray,suspension  INSTILL 2 SPRAYS IN EACH NOSTRIL QD    . hydrocortisone cream 0.5 % 1 application as needed.     . Magnesium 500 MG TABS Take 1 tablet by mouth daily.    Marland Kitchen omeprazole (PRILOSEC) 20 MG capsule Take 20 mg by mouth daily.    Marland Kitchen PRESCRIPTION MEDICATION STRENGTIA 30MG  BID    . PRESCRIPTION MEDICATION HEPATO-CI BID    .  PRESCRIPTION MEDICATION OMEGA CO3-SE BID    . PRESCRIPTION MEDICATION b12 GD ONE DROPPER    . PRESCRIPTION MEDICATION GLYSEN BID    . Semaglutide,0.25 or 0.5MG /DOS, (OZEMPIC, 0.25 OR 0.5 MG/DOSE,) 2 MG/1.5ML SOPN Inject 0.25 mg weekly x4 weeks, then increase to 0.5 mg weekly 3 mL 1  . spironolactone (ALDACTONE) 50 MG tablet Take 1 tablet (50 mg total) by mouth daily. 90 tablet 3  . topiramate (TOPAMAX) 50 MG tablet Take 1 tablet (50 mg total) by mouth 2 (two) times daily. 180 tablet 3  . TRIAMCINOLONE ACETONIDE, TOP, 0.05 % OINT Apply 1 application topically as needed.    . valsartan (DIOVAN) 80 MG tablet Take 1 tablet (80 mg total) by mouth daily. 30 tablet 3  . Vitamin E 180 MG CAPS Take 1 capsule by mouth daily.     No current facility-administered medications on file prior to visit.    Allergies  Allergen Reactions  . Hydralazine Itching  . Meperidine Hcl Other (See Comments)  . Aliskiren Hives  . Aspirin Nausea Only  . Chocolate   . Juniper Berries   . Latex     hives  . Oxycodone-Acetaminophen Itching  . Rofecoxib Diarrhea  . Spironolactone Other (See Comments)  . Furosemide Other (See Comments)    Rash and extreme dehydration  . Sulfa Antibiotics Rash    Assessment/Plan:  1. Hypertension - Clinic SBP near goal <580/99IPJA, however diastolic BP remains elevated. Medication adherence appears optimal. Given history of bilateral LE edema, discussed increasing valsartan to 160 mg daily pending follow-up labs, however pt would like to avoid medications that may affect her kidneys. Patient agreed to increase amlodipine to 7.5 mg daily and will monitor for LE edema. Continued valsartan 80 mg daily, spironolactone50mg  daily,andBystolic 20 mg daily. Encouraged patient to start checking BP daily around 1-2PM and to bring log to next visit. Patient verbalized understanding. Re-discussed healthy eating habits including a diet full of vegetables, fruit and lean meats (chicken, [place],  fish) and to limit salt intake by eating fresh or frozen vegetables (instead of canned), rinse canned vegetables prior to cooking and do not add any additional salt to meals. Patient verbalized understanding. Will check BMET today given mild increase in serum creatinine since initiating valsartan. Follow-up appointment scheduled in 4 weeks.   Lorel Monaco, PharmD, [place] [address], [place], [place] [address] Phone: [phone]; Fax: [phone]

## 2020-08-10 ENCOUNTER — Other Ambulatory Visit: Payer: Self-pay

## 2020-08-10 ENCOUNTER — Encounter: Payer: Self-pay | Admitting: Pharmacist

## 2020-08-10 ENCOUNTER — Ambulatory Visit (INDEPENDENT_AMBULATORY_CARE_PROVIDER_SITE_OTHER): Payer: Medicare Other | Admitting: Pharmacist

## 2020-08-10 VITALS — BP 134/81 | Wt 278.0 lb

## 2020-08-10 DIAGNOSIS — I10 Essential (primary) hypertension: Secondary | ICD-10-CM | POA: Diagnosis not present

## 2020-08-10 MED ORDER — AMLODIPINE BESYLATE 5 MG PO TABS: 7.5000 mg | ORAL_TABLET | Freq: Every day | ORAL | 2 refills | Status: DC

## 2020-08-10 NOTE — Patient Instructions (Signed)
It was nice to see you today!  Your goal blood pressure is <130/80 mmHg  Medication Changes: Begin taking amlodipine 7.5 mg daily (1.5 tabs daily)  Continue valsartan 80 mg daily  Continue spironolactone50mg  daily  Continue systolic 20 mg daily(AM)  Monitor blood pressure at home daily around 1-2PM  and keep a log (on your phone or piece of paper) to bring with you to your next visit. Write down date, time, blood pressure and pulse.  Keep up the good work with diet and exercise. Aim for a diet full of vegetables, fruit and lean meats (chicken, [place], fish). Try to limit salt intake by eating fresh or frozen vegetables (instead of canned), rinse canned vegetables prior to cooking and do not add any additional salt to meals.   Please give [name] a call at [phone] with any questions or concerns.

## 2020-08-11 ENCOUNTER — Telehealth: Payer: Medicare Other

## 2020-08-11 ENCOUNTER — Ambulatory Visit: Payer: Self-pay

## 2020-08-11 DIAGNOSIS — I10 Essential (primary) hypertension: Secondary | ICD-10-CM

## 2020-08-11 DIAGNOSIS — R7303 Prediabetes: Secondary | ICD-10-CM

## 2020-08-11 DIAGNOSIS — Z79899 Other long term (current) drug therapy: Secondary | ICD-10-CM

## 2020-08-11 LAB — BASIC METABOLIC PANEL
BUN/Creatinine Ratio: 14 (ref 12–28)
BUN: 15 mg/dL (ref 8–27)
CO2: 23 mmol/L (ref 20–29)
Calcium: 10.3 mg/dL (ref 8.7–10.3)
Chloride: 104 mmol/L (ref 96–106)
Creatinine, Ser: 1.07 mg/dL — ABNORMAL HIGH (ref 0.57–1.00)
Glucose: 86 mg/dL (ref 65–99)
Potassium: 4.5 mmol/L (ref 3.5–5.2)
Sodium: 141 mmol/L (ref 134–144)
eGFR: 57 mL/min/{1.73_m2} — ABNORMAL LOW (ref >59)

## 2020-08-11 NOTE — Chronic Care Management (AMB) (Signed)
Care Management   Follow Up Note   08/11/2020 Name: Brandi [name] MRN: [id number] DOB: [birth year]   Referred by: [name], FNP Reason for referral : Chronic Care Management   Third unsuccessful telephone outreach was attempted today. The patient was referred to the case management team for assistance with care management and care coordination. The patient's primary care provider has been notified of our unsuccessful attempts to make or maintain contact with the patient. The care management team is pleased to engage with this patient at any time in the future should he/she be interested in assistance from the care management team.    SW goal closure due to inability to maintain patient contact.  Patient Care Plan: Social Work Care Plan    Problem Identified: Care Coordination     Long-Range Goal: Collaborate with RN Care Manager to perform appropriate assessments to assist with care coordination needs Completed 08/11/2020  Start Date: 04/05/2020  Expected End Date: 08/03/2020  Recent Progress: On track  Priority: Low  Note:   Current Barriers:  . Chronic disease management support and education needs related to HTN and sleep apnea    Social Worker Clinical Goal(s):  Marland Kitchen Over the next 120 days, patient will work with SW to identify and address any acute and/or chronic care coordination needs related to the self health management of HTN and sleep apnea    08/11/20- Goal closed due to inability to maintain patient contact  CCM SW Interventions:  . Inter-disciplinary care team collaboration (see longitudinal plan of care) . Collaboration with [name], FNP regarding development and update of comprehensive plan of care as evidenced by provider attestation and co-signature . Successful outbound call placed to the patient to assist with care coordination needs . Determined there are no [facility] acute needs identified at this time . Performed chart review to note two referrals placed in  December 2021 closed due to inability to contact the patient . Discussed the patient got busy with holidays, then the passing of her brother in law in January 2022 which led to acute depression in her sister. The patient indicates she spent a lot of time caring for her sister and did not take time to follow up on her own health care needs . Determined the patient is still interested in a referral to [facility] as well as a referral for a colonoscopy . Identified the patient primary care provider, [name] FNP as referring provider for colonoscopy . Collaboration with [name] FNP to request a new referral for colonoscopy be places . Identified the patient was referred to [facility] by her cardiology office . Discussed the patient would also like to re-engage with Lorel Monaco, Pharmacist at [facility] re: HTN management . Collaboration with Karlene Einstein Nw[place]u to request new referral for [facility] as well as outreach to the patient as requested . Scheduled follow up over the next 30 days  Patient Goals/Self-Care Activities . Over the next 90 days, patient will:   - Patient will self administer medications as prescribed Patient will attend all scheduled provider appointments Patient will call provider office for new concerns or questions Contact SW as needed prior to next scheduled call Engage with providers regarding referrals as needed  Follow Up Plan:  SW will follow up with the patient over the next 30 days        Follow Up Plan: No SW follow up planned at this time due to inability to  maintain patient contact. Patient will remain engaged with RN Care Manager.  Daneen Schick, BSW, CDP Social Worker, Certified Dementia Practitioner [place] [facility] [phone]

## 2020-08-14 ENCOUNTER — Other Ambulatory Visit: Payer: Self-pay | Admitting: Nurse Practitioner

## 2020-08-25 ENCOUNTER — Encounter (INDEPENDENT_AMBULATORY_CARE_PROVIDER_SITE_OTHER): Payer: Self-pay | Admitting: Bariatrics

## 2020-08-25 ENCOUNTER — Other Ambulatory Visit: Payer: Self-pay

## 2020-08-25 ENCOUNTER — Ambulatory Visit (INDEPENDENT_AMBULATORY_CARE_PROVIDER_SITE_OTHER): Payer: Medicare Other | Admitting: Bariatrics

## 2020-08-25 VITALS — BP 113/79 | HR 100 | Temp 98.2°F | Ht 63.0 in | Wt 273.0 lb

## 2020-08-25 DIAGNOSIS — R0602 Shortness of breath: Secondary | ICD-10-CM | POA: Diagnosis not present

## 2020-08-25 DIAGNOSIS — G4733 Obstructive sleep apnea (adult) (pediatric): Secondary | ICD-10-CM

## 2020-08-25 DIAGNOSIS — Z0289 Encounter for other administrative examinations: Secondary | ICD-10-CM

## 2020-08-25 DIAGNOSIS — E785 Hyperlipidemia, unspecified: Secondary | ICD-10-CM | POA: Diagnosis not present

## 2020-08-25 DIAGNOSIS — R5383 Other fatigue: Secondary | ICD-10-CM

## 2020-08-25 DIAGNOSIS — Z6841 Body Mass Index (BMI) 40.0 and over, adult: Secondary | ICD-10-CM | POA: Diagnosis not present

## 2020-08-25 DIAGNOSIS — Z1331 Encounter for screening for depression: Secondary | ICD-10-CM

## 2020-08-25 DIAGNOSIS — E559 Vitamin D deficiency, unspecified: Secondary | ICD-10-CM

## 2020-08-25 DIAGNOSIS — R7303 Prediabetes: Secondary | ICD-10-CM

## 2020-08-25 DIAGNOSIS — I10 Essential (primary) hypertension: Secondary | ICD-10-CM | POA: Diagnosis not present

## 2020-08-25 DIAGNOSIS — E7849 Other hyperlipidemia: Secondary | ICD-10-CM

## 2020-08-26 LAB — HEMOGLOBIN A1C
Est. average glucose Bld gHb Est-mCnc: 120 mg/dL
Hgb A1c MFr Bld: 5.8 % — ABNORMAL HIGH (ref 4.8–5.6)

## 2020-08-26 LAB — T4, FREE: Free T4: 1.19 ng/dL (ref 0.82–1.77)

## 2020-08-26 LAB — LIPID PANEL WITH LDL/HDL RATIO
Cholesterol, Total: 123 mg/dL (ref 100–199)
HDL: 47 mg/dL (ref >39)
LDL Chol Calc (NIH): 58 mg/dL (ref 0–99)
LDL/HDL Ratio: 1.2 ratio (ref 0.0–3.2)
Triglycerides: 97 mg/dL (ref 0–149)
VLDL Cholesterol Cal: 18 mg/dL (ref 5–40)

## 2020-08-26 LAB — VITAMIN D 25 HYDROXY (VIT D DEFICIENCY, FRACTURES): Vit D, 25-Hydroxy: 105 ng/mL — ABNORMAL HIGH (ref 30.0–100.0)

## 2020-08-26 LAB — TSH: TSH: 1.88 u[IU]/mL (ref 0.450–4.500)

## 2020-08-26 LAB — INSULIN, RANDOM: INSULIN: 19.6 u[IU]/mL (ref 2.6–24.9)

## 2020-08-26 LAB — T3: T3, Total: 105 ng/dL (ref 71–180)

## 2020-08-27 ENCOUNTER — Telehealth: Payer: Federal, State, Local not specified - PPO

## 2020-08-31 NOTE — Progress Notes (Signed)
Chief Complaint:   OBESITY Brandi [name] (MR# [id number]) is a 65 y.o. female who presents for evaluation and treatment of obesity and related comorbidities. Current BMI is Body mass index is 48.36 kg/m. Brandi [name] has been struggling with her weight for many years and has been unsuccessful in either losing weight, maintaining weight loss, or reaching her healthy weight goal.  Brandi [name] states that she has food allergies. She does like to cook sometimes. She states that she sometimes skips breakfast.  Brandi [name] is currently in the action stage of change and ready to dedicate time achieving and maintaining a healthier weight. Brandi [name] is interested in becoming our patient and working on intensive lifestyle modifications including (but not limited to) diet and exercise for weight loss.  Brandi [name]'s habits were reviewed today and are as follows: Her family eats meals together, she thinks her family will eat healthier with her, her desired weight loss is 103 lbs, she has been heavy most of her life, she started gaining weight after having her kids, her heaviest weight ever was 300 pounds, she has significant food cravings issues, she skips meals frequently, she is frequently drinking liquids with calories, she frequently makes poor food choices, she has problems with excessive hunger, she frequently eats larger portions than normal and she struggles with emotional eating.  Depression Screen Brandi [name]'s Food and Mood (modified PHQ-9) score was 9.  Depression screen PHQ 2/9 08/25/2020  Decreased Interest 1  Down, Depressed, Hopeless 1  PHQ - 2 Score 2  Altered sleeping 1  Tired, decreased energy 1  Change in appetite 1  Feeling bad or failure about yourself  1  Trouble concentrating 1  Moving slowly or fidgety/restless 1  Suicidal thoughts 1  PHQ-9 Score 9  Difficult doing work/chores Not difficult at all   Subjective:   1. Other fatigue Brandi [name] admits to daytime somnolence and admits to waking up still  tired. Patent has a history of symptoms of daytime fatigue. Brandi [name] generally gets 6 or 7 hours of sleep per night, and states that she has generally restful sleep. Snoring is present. Apneic episodes are not present. Epworth Sleepiness Score is 15.  2. SOB (shortness of breath) on exertion Brandi [name] notes increasing shortness of breath with exercising and seems to be worsening over time with weight gain. She notes getting out of breath sooner with activity than she used to. This has not gotten worse recently. Brandi [name] denies shortness of breath at rest or orthopnea.  3. Essential hypertension Brandi [name] is taking amlodipine, Valsartan, Bystolic, spironolactone, and Hydralazine. Her blood pressure is controlled.  4. OSA (obstructive sleep apnea) Brandi [name] wears CPAP, and she notes being well rested.  5. Other hyperlipidemia Brandi [name] is taking atorvastatin.   6. Pre-diabetes Brandi [name] is taking Ozempic, and her last A1c was 6.0.  7. Vitamin D deficiency Brandi [name] is taking Vit D, Vit E, Vit B12, and multivitamins.   Assessment/Plan:   1. Other fatigue Brandi [name] does feel that her weight is causing her energy to be lower than it should be. Fatigue may be related to obesity, depression or many other causes. Labs will be ordered, and in the meanwhile, Brandi [name] will focus on self care including making healthy food choices, increasing physical activity and focusing on stress reduction.  - EKG 12-Lead - Hemoglobin A1c - Insulin, random - Lipid Panel With LDL/HDL Ratio - T3 - T4, free - TSH - VITAMIN D 25 Hydroxy (Vit-D Deficiency, Fractures)  2. SOB (shortness of breath) on exertion Brandi [name] does  feel that she gets out of breath more easily that she used to when she exercises. Brandi [name]'s shortness of breath appears to be obesity related and exercise induced. She has agreed to work on weight loss and gradually increase exercise to treat her exercise induced shortness of breath. Will continue to monitor closely.  3. Essential  hypertension Brandi [name] will continue her medications, and will work on healthy weight loss and exercise to improve blood pressure control. We will watch for [name] of hypotension as she continues her lifestyle modifications.  4. OSA (obstructive sleep apnea) Intensive lifestyle modifications are the first line treatment for this issue. We discussed several lifestyle modifications today. Brandi [name] will continue wearing her CPAP, and will continue to work on diet, exercise and weight loss efforts. We will continue to monitor. Orders and follow up as documented in patient record.   5. Other hyperlipidemia Cardiovascular risk and specific lipid/LDL goals reviewed. We discussed several lifestyle modifications today. We will check labs today. Brandi [name] will her medications, and will continue to work on diet, exercise and weight loss efforts. Orders and follow up as documented in patient record.   Counseling Intensive lifestyle modifications are the first line treatment for this issue. . Dietary changes: Increase soluble fiber. Decrease simple carbohydrates. . Exercise changes: Moderate to vigorous-intensity aerobic activity 150 minutes per week if tolerated. . Lipid-lowering medications: see documented in medical record.  - Lipid Panel With LDL/HDL Ratio  6. Pre-diabetes Brandi [name] will continue to work on weight loss, exercise, and decreasing simple carbohydrates to help decrease the risk of diabetes. We will check labs today.  - Hemoglobin A1c - Insulin, random  7. Vitamin D deficiency Low Vitamin D level contributes to fatigue and are associated with obesity, breast, and colon cancer. We will check labs today. Brandi [name] will follow-up for routine testing of Vitamin D, at least 2-3 times per year to avoid over-replacement.  - VITAMIN D 25 Hydroxy (Vit-D Deficiency, Fractures)  8. Depression screen Brandi [name] had a positive depression screening. Depression is commonly associated with obesity and often results in  emotional eating behaviors. We will monitor this closely and work on CBT to help improve the non-hunger eating patterns. Referral to Psychology may be required if no improvement is seen as she continues in our clinic.  9. Class 3 severe obesity with serious comorbidity and body mass index (BMI) of 45.0 to 49.9 in adult, unspecified obesity type (HCC) Nazaret is currently in the action stage of change and her goal is to continue with weight loss efforts. I recommend Promise begin the structured treatment plan as follows:  She has agreed to the Category 1 Plan.  Intentional eating was discussed. Maecy is to stop all sugary drinks. I reviewed labs from 03/11/2020 and 08/10/2020 with the patient today.  Exercise goals: No exercise has been prescribed at this time.   Behavioral modification strategies: increasing lean protein intake, decreasing simple carbohydrates, increasing vegetables, increasing water intake, decreasing eating out, no skipping meals, meal planning and cooking strategies, keeping healthy foods in the home and planning for success.  She was informed of the importance of frequent follow-up visits to maximize her success with intensive lifestyle modifications for her multiple health conditions. She was informed we would discuss her lab results at her next visit unless there is a critical issue that needs to be addressed sooner. Mahogany agreed to keep her next visit at the agreed upon time to discuss these results.  Objective:   Blood pressure 113/79, pulse 100, temperature 98.2  F (36.8 C), height 5\' 3"  (1.6 m), weight 273 lb (123.8 kg), SpO2 97 %. Body mass index is 48.36 kg/m.  EKG: Normal sinus rhythm, rate 101 BPM.  Indirect Calorimeter completed today shows a VO2 of 182 and a REE of 1269.  Her calculated basal metabolic rate is 5188 thus her basal metabolic rate is worse than expected.  General: Cooperative, alert, well developed, in no acute distress. HEENT: Conjunctivae and lids  unremarkable. Cardiovascular: Regular rhythm.  Lungs: Normal work of breathing. Neurologic: No focal deficits.   Lab Results  Component Value Date   CREATININE 1.07 (H) 08/10/2020   BUN 15 08/10/2020   NA 141 08/10/2020   K 4.5 08/10/2020   CL 104 08/10/2020   CO2 23 08/10/2020   Lab Results  Component Value Date   ALT 14 10/09/2019   AST 18 10/09/2019   ALKPHOS 200 (H) 10/09/2019   BILITOT 0.4 10/09/2019   Lab Results  Component Value Date   HGBA1C 5.8 (H) 08/25/2020   HGBA1C 6.0 (H) 06/30/2020   HGBA1C 6.0 (H) 03/11/2020   HGBA1C 6.1 (H) 10/09/2019   HGBA1C 6.6 (H) 01/06/2019   Lab Results  Component Value Date   INSULIN 19.6 08/25/2020   Lab Results  Component Value Date   TSH 1.880 08/25/2020   Lab Results  Component Value Date   CHOL 123 08/25/2020   HDL 47 08/25/2020   LDLCALC 58 08/25/2020   TRIG 97 08/25/2020   CHOLHDL 2.6 03/11/2020   Lab Results  Component Value Date   WBC 7.2 01/27/2019   HGB 12.4 01/27/2019   HCT 38.4 01/27/2019   MCV 90 01/27/2019   PLT 275 01/27/2019   No results found for: IRON, TIBC, FERRITIN Obesity Behavioral Intervention:   Approximately 15 minutes were spent on the discussion below.  ASK: We discussed the diagnosis of obesity with Brandi [name] today and Tanyika agreed to give [name] permission to discuss obesity behavioral modification therapy today.  ASSESS: Geraldin has the diagnosis of obesity and her BMI today is 48.37. Marda is in the action stage of change.   ADVISE: Tyna was educated on the multiple health risks of obesity as well as the benefit of weight loss to improve her health. She was advised of the need for long term treatment and the importance of lifestyle modifications to improve her current health and to decrease her risk of future health problems.  AGREE: Multiple dietary modification options and treatment options were discussed and Tye agreed to follow the recommendations documented in the above  note.  ARRANGE: Rosary was educated on the importance of frequent visits to treat obesity as outlined per CMS and USPSTF guidelines and agreed to schedule her next follow up appointment today.  Attestation Statements:   Reviewed by clinician on day of visit: allergies, medications, problem list, medical history, surgical history, family history, social history, and previous encounter notes.   Wilhemena Durie, am acting as Location manager for [employer], DO.  I have reviewed the above documentation for accuracy and completeness, and I agree with the above. [name], DO

## 2020-09-06 ENCOUNTER — Other Ambulatory Visit: Payer: Self-pay | Admitting: Nurse Practitioner

## 2020-09-06 NOTE — Progress Notes (Signed)
Patient ID: Brandi [name]                 DOB: 12-Oct-1955                      MRN: [id number]     HPI: Brandi [name] is a 65 y.o. female referred by Dr. Tamala Julian to HTN clinic. PMH is significant for severe hypertension, hypertensive heart disease, congestive heart failure (EF 55-60% in 2020), obstructive sleep apnea, multiple medication intolerances, significant bilateral lower extremity edema, and morbid obesity, prediabetes (A1c 6.1%). Pt last seen in HTN clinic on 08/10/20 and BP was 134/81. Average reported home BP was 133/96. Amlodipine was increased from 5 mg to 7.5 mg daily. At visit on 08/25/20 with [facility], BP was 113/79 and discussed lifestyle modifications including (but not limited to) diet and exercise for weight loss.  Patient presents today in good spirits ambulating with a cane. Reports medication adherence with HTN medications. Reports minor LE swelling, however feet elevation helps reduce swelling. Denies dizziness, headaches, and blurred vision. Reports average home BP readings 127/87 (see below). Additionally, reports completion of 4th dose of Ozempic (semaglutide) 0.5 mg this week with plans to start 1 mg next week Tuesday. Denies GI upset.  Current HTN meds:  Valsartan 80 mg daily (AM) Amlodipine 7.5 mg daily (PM) Spironolactone 50 mg daily (AM) Bystolic 20 mg daily (AM)   Previously tried: hydralazine (itching), Aliskiren (hives), Lasix (rash and extreme dehydration), carvedilol and metoprolol (hives), propranolol (vomiting)    BP goal: <130/80 mmHg  Family History: Diabetes in her father; Heart disease in her father and mother; Hypertension in her mother and sister; Kidney disease in her father.   Social History: denies tobacco use; drinks alcohol rarely   Diet: working with [facility] - Category diet 1 plan: focuses on vegetables, lean meat, and fruits; can eat healthy choice meals, lean cuisine, or smart choice meals;  can drink water, juice, premium protein, how limits soda; avoiding salt   Exercise: limited due to cane but plans to work with weight loss clinic  Home BP readings: May 17- June 13th   Date SBP DBP Pulse  17-May 134 97 95  18-May 136 91 84  19-May 124 85 94  20-May 128 89 88  21-May 124 92 93  22-May 126 82 72  23-May 133 86 70  24-May 119 80 80  25-May 126 89 84  26-May 108 72 87  27-May 115 93 85  28-May 131 88 84  29-May 123 90 94  30-May 132 88 73  31-May 113 78 81  1-Jun 128 89 80  2-Jun 129 89 87  3-Jun 129 90 89  4-Jun 131 86 85  5-Jun 123 81 75  6-Jun 136 89 80  7-Jun 149 98 88  8-Jun 132 80 80  9-Jun 124 89 80  10-Jun 106 72 88  11-Jun 128 89 79  12-Jun 130 89 86       AVG 127 87 84    Wt Readings from Last 3 Encounters:  09/07/20 274 lb 12.8 oz (124.6 kg)  08/25/20 273 lb (123.8 kg)  08/10/20 278 lb (126.1 kg)   BP Readings from Last 3 Encounters:  09/07/20 124/84  08/25/20 113/79  08/10/20 134/81   Pulse Readings from Last 3 Encounters:  09/07/20 92  08/25/20 100  07/13/20 94    Renal function: CrCl cannot be calculated (Patient's most recent lab  result is older than the maximum 21 days allowed.).  Past Medical History:  Diagnosis Date   Anemia    Arthritis    Back pain    Edema, lower extremity    Food allergy    GERD (gastroesophageal reflux disease)    Hypertension    Hypertensive crisis    Joint pain    Morbid obesity ([place])    Osteoporosis    Pre-diabetes    Rheumatoid arthritis ([place])    Sciatica neuralgia    Sleep apnea     Current Outpatient Medications on File Prior to Visit  Medication Sig Dispense Refill   amLODipine (NORVASC) 5 MG tablet Take 1.5 tablets (7.5 mg total) by mouth daily. 45 tablet 2   atorvastatin (LIPITOR) 40 MG tablet TAKE 1 TABLET BY MOUTH EVERY DAY 90 tablet 3   Biotin 1000 MCG tablet Take 1,000 mcg by mouth daily.     BYSTOLIC 20 MG TABS Take 1 tablet by mouth once daily 90 tablet 3   cetirizine  (ZYRTEC) 10 MG tablet Take 10 mg by mouth daily.     Cholecalciferol (VITAMIN D) 50 MCG (2000 UT) CAPS Take 1 capsule by mouth daily.     cyclobenzaprine (FLEXERIL) 10 MG tablet TAKE 1 TABLET(10 MG) BY MOUTH THREE TIMES DAILY 270 tablet 1   fluticasone (FLONASE) 50 MCG/ACT nasal spray Flonase Allergy Relief 50 mcg/actuation nasal spray,suspension  INSTILL 2 SPRAYS IN EACH NOSTRIL QD     Magnesium 500 MG TABS Take 1 tablet by mouth daily.     omeprazole (PRILOSEC) 20 MG capsule Take 20 mg by mouth daily.     PRESCRIPTION MEDICATION STRENGTIA 30MG  BID     PRESCRIPTION MEDICATION HEPATO-CI BID     PRESCRIPTION MEDICATION OMEGA CO3-SE BID     PRESCRIPTION MEDICATION b12 GD ONE DROPPER     spironolactone (ALDACTONE) 50 MG tablet Take 1 tablet (50 mg total) by mouth daily. 90 tablet 3   topiramate (TOPAMAX) 25 MG tablet TAKE 1 TABLET(25 MG) BY MOUTH DAILY 90 tablet 1   topiramate (TOPAMAX) 50 MG tablet Take 1 tablet (50 mg total) by mouth 2 (two) times daily. 180 tablet 3   valsartan (DIOVAN) 80 MG tablet Take 1 tablet (80 mg total) by mouth daily. 30 tablet 3   Vitamin E 180 MG CAPS Take 1 capsule by mouth daily.     No current facility-administered medications on file prior to visit.    Allergies  Allergen Reactions   Hydralazine Itching   Meperidine Hcl Other (See Comments)   Aliskiren Hives   Aspirin Nausea Only   Chocolate    Juniper Berries    Latex     hives   Oxycodone-Acetaminophen Itching   Rofecoxib Diarrhea   Spironolactone Other (See Comments)   Furosemide Other (See Comments)    Rash and extreme dehydration   Sulfa Antibiotics Rash     Assessment/Plan:  1. Hypertension - BP near goal <130/80 mmHg. Medication adherence appears optimal. Continued amlodipine 7.5 mg daily, valsartan 80 mg daily, spironolactone 50 mg daily, and Bystolic 20 mg daily. Encouraged patient to continue checking BP daily and work with [facility] for diet and exercise management.  Patient verbalized understanding. Follow-up appointment scheduled in 4 weeks.   2. Weight loss/pre-diabetes - Medication adherence appears optimal with Ozempic (semaglitude) 0.5 mg weekly. Current weight 274 lbs (baseline 286 lbs). Will increase Ozempic to 1 mg weekly x4 weeks. Encouraged patient to continue to work with [facility] for diet and exercise management. Patient verbalized understanding. Follow-up appointment scheduled in 4 weeks for Ozempic dose titration to 2 mg weekly.  Lorel Monaco, PharmD, [facility] [address] [place], [place] [address] Phone: [phone]; Fax: [phone]

## 2020-09-07 ENCOUNTER — Encounter: Payer: Self-pay | Admitting: Pharmacist

## 2020-09-07 ENCOUNTER — Other Ambulatory Visit: Payer: Self-pay

## 2020-09-07 ENCOUNTER — Ambulatory Visit (INDEPENDENT_AMBULATORY_CARE_PROVIDER_SITE_OTHER): Payer: Medicare Other | Admitting: Pharmacist

## 2020-09-07 VITALS — BP 124/84 | HR 92 | Wt 274.8 lb

## 2020-09-07 DIAGNOSIS — I10 Essential (primary) hypertension: Secondary | ICD-10-CM

## 2020-09-07 DIAGNOSIS — R7303 Prediabetes: Secondary | ICD-10-CM

## 2020-09-07 MED ORDER — OZEMPIC (1 MG/DOSE) 2 MG/1.5ML ~~LOC~~ SOPN: 1.0000 mg | PEN_INJECTOR | SUBCUTANEOUS | 0 refills | Status: DC

## 2020-09-07 NOTE — Patient Instructions (Signed)
It was nice to see you today!  Medication Changes:  Begin taking Ozempic (semaglutide) 1 mg weekly x4 weeks.  Continue valsartan 80 mg daily  Continue amlodipine 7.5 mg daily  Continue spironolactone 50 mg daily  Continue Bystolic 20 mg daily  Monitor blood pressure at home daily and keep a log (on your phone or piece of paper) to bring with you to your next visit. Write down date, time, blood pressure and pulse.  Keep up the good work with diet and exercise. Aim for a diet full of vegetables, fruit and lean meats (chicken, [place], fish). Try to limit salt intake by eating fresh or frozen vegetables (instead of canned), rinse canned vegetables prior to cooking and do not add any additional salt to meals.

## 2020-09-08 ENCOUNTER — Ambulatory Visit (INDEPENDENT_AMBULATORY_CARE_PROVIDER_SITE_OTHER): Payer: Medicare Other | Admitting: Bariatrics

## 2020-09-22 ENCOUNTER — Ambulatory Visit (INDEPENDENT_AMBULATORY_CARE_PROVIDER_SITE_OTHER): Payer: Medicare Other | Admitting: Bariatrics

## 2020-09-29 ENCOUNTER — Other Ambulatory Visit: Payer: Self-pay

## 2020-09-29 ENCOUNTER — Telehealth: Payer: Self-pay

## 2020-09-29 ENCOUNTER — Encounter (INDEPENDENT_AMBULATORY_CARE_PROVIDER_SITE_OTHER): Payer: Self-pay | Admitting: Bariatrics

## 2020-09-29 ENCOUNTER — Ambulatory Visit (INDEPENDENT_AMBULATORY_CARE_PROVIDER_SITE_OTHER): Payer: Medicare Other | Admitting: Bariatrics

## 2020-09-29 VITALS — BP 125/88 | HR 98 | Temp 98.0°F | Ht 63.0 in | Wt 262.0 lb

## 2020-09-29 DIAGNOSIS — Z6841 Body Mass Index (BMI) 40.0 and over, adult: Secondary | ICD-10-CM | POA: Diagnosis not present

## 2020-09-29 DIAGNOSIS — E559 Vitamin D deficiency, unspecified: Secondary | ICD-10-CM | POA: Diagnosis not present

## 2020-09-29 DIAGNOSIS — R7303 Prediabetes: Secondary | ICD-10-CM

## 2020-09-29 NOTE — Telephone Encounter (Signed)
Patient notified that her FMLA forms have been completed and faxed. YL,RMA

## 2020-10-04 ENCOUNTER — Encounter (INDEPENDENT_AMBULATORY_CARE_PROVIDER_SITE_OTHER): Payer: Self-pay | Admitting: Bariatrics

## 2020-10-04 NOTE — Progress Notes (Signed)
Chief Complaint:   OBESITY Brandi [name] is here to discuss her progress with her obesity treatment plan along with follow-up of her obesity related diagnoses. Brandi [name] is on the Category 1 Plan and states she is following her eating plan approximately 50% of the time. Brandi [name] states she is not exercising 0 minutes 0 times per week.  Today's visit was #: 2 Starting weight: 273 lbs Starting date: 08/25/2020 Today's weight: 262 lbs Today's date: 09/29/2020 Total lbs lost to date: 11 lbs Total lbs lost since last in-office visit: 11  Interim History: Brandi [name] is down 11 lbs since her last visit.  .  She snacks at night and the afternoon.  Subjective:   1. Pre-diabetes Brandi [name]'s A1c level is 5.8 and her Insulin level is 19.6.  2. Vitamin D deficiency Brandi [name]'s last Vitamin D level was 105.8.  Assessment/Plan:   1. Pre-diabetes Brandi [name] will continue to work on weight loss, exercise, and decreasing simple carbohydrates to help decrease the risk of diabetes. She will keep carbohydrates low (sweets and starches), and increase healthy fats and protein.  2. Vitamin D deficiency Low Vitamin D level contributes to fatigue and are associated with obesity, breast, and colon cancer. I called the patient, and asked her to discontinue Vitamin D, and to not resume at this time. Brandi [name] will follow-up for routine testing of Vitamin D, at least 2-3 times per year to avoid over-replacement.  3. Obesity, current BMI 46.41 Brandi [name] is currently in the action stage of change. As such, her goal is to continue with weight loss efforts. She has agreed to the Category 1 Plan.   Brandi [name] will continue meal planning, and will adhere closely to the plan.  I reviewed labs with Brandi [name] from 08/25/2020.  Exercise goals:  Will get up and move around in between commericals.  Behavioral modification strategies: increasing lean protein intake, decreasing simple carbohydrates, increasing vegetables, increasing water intake, decreasing eating out,  no skipping meals, meal planning and cooking strategies, keeping healthy foods in the home, and planning for success.  Brandi [name] has agreed to follow-up with our clinic in 2 weeks. She was informed of the importance of frequent follow-up visits to maximize her success with intensive lifestyle modifications for her multiple health conditions. Brandi [name] is using a cane for walking.  Objective:   Blood pressure 125/88, pulse 98, temperature 98 F (36.7 C), height 5\' 3"  (1.6 m), weight 262 lb (118.8 kg), SpO2 97 %. Body mass index is 46.41 kg/m.  General: Cooperative, alert, well developed, in no acute distress. HEENT: Conjunctivae and lids unremarkable. Cardiovascular: Regular rhythm.  Lungs: Normal work of breathing. Neurologic: No focal deficits.   Lab Results  Component Value Date   CREATININE 1.07 (H) 08/10/2020   BUN 15 08/10/2020   NA 141 08/10/2020   K 4.5 08/10/2020   CL 104 08/10/2020   CO2 23 08/10/2020   Lab Results  Component Value Date   ALT 14 10/09/2019   AST 18 10/09/2019   ALKPHOS 200 (H) 10/09/2019   BILITOT 0.4 10/09/2019   Lab Results  Component Value Date   HGBA1C 5.8 (H) 08/25/2020   HGBA1C 6.0 (H) 06/30/2020   HGBA1C 6.0 (H) 03/11/2020   HGBA1C 6.1 (H) 10/09/2019   HGBA1C 6.6 (H) 01/06/2019   Lab Results  Component Value Date   INSULIN 19.6 08/25/2020   Lab Results  Component Value Date   TSH 1.880 08/25/2020   Lab Results  Component Value Date   CHOL 123 08/25/2020  HDL 47 08/25/2020   LDLCALC 58 08/25/2020   TRIG 97 08/25/2020   CHOLHDL 2.6 03/11/2020   Lab Results  Component Value Date   VD25OH 105.0 (H) 08/25/2020   Lab Results  Component Value Date   WBC 7.2 01/27/2019   HGB 12.4 01/27/2019   HCT 38.4 01/27/2019   MCV 90 01/27/2019   PLT 275 01/27/2019   No results found for: IRON, TIBC, FERRITIN  Obesity Behavioral Intervention:   Approximately 15 minutes were spent on the discussion below.  ASK: We discussed the diagnosis  of obesity with Brandi [name] today and Nidya agreed to give [name] permission to discuss obesity behavioral modification therapy today.  ASSESS: Hiliana has the diagnosis of obesity and her BMI today is 48.3. Liz is in the action stage of change.   ADVISE: Ameilia was educated on the multiple health risks of obesity as well as the benefit of weight loss to improve her health. She was advised of the need for long term treatment and the importance of lifestyle modifications to improve her current health and to decrease her risk of future health problems.  AGREE: Multiple dietary modification options and treatment options were discussed and Brandi [name] agreed to follow the recommendations documented in the above note.  ARRANGE: Lasheena was educated on the importance of frequent visits to treat obesity as outlined per CMS and USPSTF guidelines and agreed to schedule her next follow up appointment today.  Attestation Statements:   Reviewed by clinician on day of visit: allergies, medications, problem list, medical history, surgical history, family history, social history, and previous encounter notes.   I, Brandi [name], RMA, am acting as Location manager for [employer], DO.   I have reviewed the above documentation for accuracy and completeness, and I agree with the above. [name], DO

## 2020-10-05 ENCOUNTER — Ambulatory Visit: Payer: Medicare Other

## 2020-10-05 ENCOUNTER — Other Ambulatory Visit: Payer: Self-pay

## 2020-10-05 ENCOUNTER — Ambulatory Visit
Admission: RE | Admit: 2020-10-05 | Discharge: 2020-10-05 | Disposition: A | Discharge status: Home / Self Care | Payer: Medicare Other | Source: Ambulatory Visit | Attending: Nurse Practitioner | Admitting: Nurse Practitioner

## 2020-10-05 DIAGNOSIS — Z1231 Encounter for screening mammogram for malignant neoplasm of breast: Secondary | ICD-10-CM | POA: Diagnosis not present

## 2020-10-06 ENCOUNTER — Ambulatory Visit (INDEPENDENT_AMBULATORY_CARE_PROVIDER_SITE_OTHER): Payer: Medicare Other | Admitting: Pharmacist

## 2020-10-06 VITALS — BP 120/92 | HR 95 | Wt 262.0 lb

## 2020-10-06 DIAGNOSIS — R7303 Prediabetes: Secondary | ICD-10-CM

## 2020-10-06 DIAGNOSIS — I10 Essential (primary) hypertension: Secondary | ICD-10-CM

## 2020-10-06 MED ORDER — OZEMPIC (2 MG/DOSE) 8 MG/3ML ~~LOC~~ SOPN: 2.0000 mg | PEN_INJECTOR | SUBCUTANEOUS | 11 refills | Status: DC

## 2020-10-06 MED ORDER — VALSARTAN 160 MG PO TABS: 160.0000 mg | ORAL_TABLET | Freq: Every day | ORAL | 5 refills | Status: DC

## 2020-10-06 NOTE — Patient Instructions (Addendum)
Schedule annual appt with Dr Tamala Julian - due October 2022  Your blood pressure goal is < 130/46mmHg  Increase your valsartan from 80mg  to 160mg  once daily  Increase your Ozempic to 2mg  once weekly  Continue taking your other medications and monitoring your blood pressure at home  Follow up in 1 month for blood pressure check on August 11 at 11am

## 2020-10-06 NOTE — Progress Notes (Signed)
Patient ID: [name]                 DOB: 12-Sep-1955                      MRN: [id number]     HPI: Brandi [name] is a 65 y.o. female referred by Dr. Tamala [name] to HTN clinic. PMH is significant for severe hypertension, hypertensive heart disease, congestive heart failure (EF 55-60% in 2020), obstructive sleep apnea, multiple medication intolerances, significant bilateral lower extremity edema, and morbid obesity, diabetes with A1c up to 6.6 back in 2020, now down to 5.8 with Ozempic. Pt last seen in HTN clinic on 08/10/20 and BP was 134/81. Average reported home BP was 133/96. Amlodipine was increased from 5 mg to 7.5 mg daily. At visit on 08/25/20 with [facility], BP was 113/79 and discussed lifestyle modifications including diet and exercise for weight loss. At most recent PharmD visit on 6/14, BP was very close to goal 124/84. Her Ozempic was increased to 1mg  weekly for continued weight loss efforts and A1c lowering.  Patient presents today in good spirits ambulating with a cane. Reports medication adherence with HTN medications. Elevates her legs when she sits, does not report any recent LE edema. Denies dizziness, headaches (on topiramate, working well). Does have some balance issues secondary to sciatica issues, has been working on this. Trying to increase walking as able. Checks BP once daily, provides detailed log today. Tolerating Ozempic 1mg  well, just gave 4th injection of this dose yesterday. Has noticed decrease in appetite. Baseline weight 284 lbs, down to 274 lbs at visit 1 month ago, now down to 262 lbs today.  Current HTN meds:  Valsartan 80 mg daily (AM) Amlodipine 7.5 mg daily (PM) Spironolactone 50 mg daily (AM) Bystolic 20 mg daily (AM)   Previously tried: hydralazine (itching), Aliskiren (hives), Lasix (rash and extreme dehydration), carvedilol and metoprolol (hives), propranolol (vomiting)    BP goal: <130/80 mmHg  Family History: Diabetes in her  father; Heart disease in her father and mother; Hypertension in her mother and sister; Kidney disease in her father.   Social History: denies tobacco use; drinks alcohol rarely   Diet: working with [facility] - Category diet 1 plan: focuses on vegetables, lean meat, and fruits; can eat healthy choice meals, lean cuisine, or smart choice meals; can drink water, juice, premium protein, now limits soda; avoiding salt and caffeine.  Exercise: limited due to cane but plans to work with weight loss clinic  Home BP readings: Checks between 1-2pm  Date SBP DBP Pulse  22-Jun 122 85 75  23-Jun 121 76 73  24-Jun 116 82 73  25-Jun 118 85 79  26-Jun 126 88 91  27-Jun 128 86 77  28-Jun 126 82 70  29-Jun 125 87 75  30-Jun 122 89 76  1-Jul 128 89 80  2-Jul 127 85 85  3-Jul 121 87 84  4-Jul 130 90 80  5-Jul 121 89 83  6-Jul 125 88 96  7-Jul 127 85 75  8-Jul 128 87 80  9-Jul 134 90 89  10-Jul 128 99 78  11-Jul 126 89 93  12-Jul 128 88 92  AVG 125 87 81    Wt Readings from Last 3 Encounters:  09/29/20 262 lb (118.8 kg)  09/07/20 274 lb 12.8 oz (124.6 kg)  08/25/20 273 lb (123.8 kg)   BP Readings from Last 3 Encounters:  09/29/20  125/88  09/07/20 124/84  08/25/20 113/79   Pulse Readings from Last 3 Encounters:  09/29/20 98  09/07/20 92  08/25/20 100    Renal function: CrCl cannot be calculated (Patient's most recent lab result is older than the maximum 21 days allowed.).  Past Medical History:  Diagnosis Date   Anemia    Arthritis    Back pain    Edema, lower extremity    Food allergy    GERD (gastroesophageal reflux disease)    Hypertension    Hypertensive crisis    Joint pain    Morbid obesity ([place])    Osteoporosis    Pre-diabetes    Rheumatoid arthritis ([place])    Sciatica neuralgia    Sleep apnea     Current Outpatient Medications on File Prior to Visit  Medication Sig Dispense Refill   amLODipine (NORVASC) 5 MG tablet Take 1.5 tablets  (7.5 mg total) by mouth daily. 45 tablet 2   atorvastatin (LIPITOR) 40 MG tablet TAKE 1 TABLET BY MOUTH EVERY DAY 90 tablet 3   Biotin 1000 MCG tablet Take 1,000 mcg by mouth daily.     BYSTOLIC 20 MG TABS Take 1 tablet by mouth once daily 90 tablet 3   cetirizine (ZYRTEC) 10 MG tablet Take 10 mg by mouth daily.     cyclobenzaprine (FLEXERIL) 10 MG tablet TAKE 1 TABLET(10 MG) BY MOUTH THREE TIMES DAILY 270 tablet 1   fluticasone (FLONASE) 50 MCG/ACT nasal spray Flonase Allergy Relief 50 mcg/actuation nasal spray,suspension  INSTILL 2 SPRAYS IN EACH NOSTRIL QD     Magnesium 500 MG TABS Take 1 tablet by mouth daily.     omeprazole (PRILOSEC) 20 MG capsule Take 20 mg by mouth daily.     PRESCRIPTION MEDICATION STRENGTIA 30MG  BID     PRESCRIPTION MEDICATION HEPATO-CI BID     PRESCRIPTION MEDICATION OMEGA CO3-SE BID     PRESCRIPTION MEDICATION b12 GD ONE DROPPER     Semaglutide, 1 MG/DOSE, (OZEMPIC, 1 MG/DOSE,) 2 MG/1.5ML SOPN Inject 1 mg into the skin once a week. 3 mL 0   spironolactone (ALDACTONE) 50 MG tablet Take 1 tablet (50 mg total) by mouth daily. 90 tablet 3   topiramate (TOPAMAX) 50 MG tablet Take 1 tablet (50 mg total) by mouth 2 (two) times daily. 180 tablet 3   valsartan (DIOVAN) 80 MG tablet Take 1 tablet (80 mg total) by mouth daily. 30 tablet 3   Vitamin E 180 MG CAPS Take 1 capsule by mouth daily.     No current facility-administered medications on file prior to visit.    Allergies  Allergen Reactions   Hydralazine Itching   Meperidine Hcl Other (See Comments)   Aliskiren Hives   Aspirin Nausea Only   Chocolate    Juniper Berries    Latex     hives   Oxycodone-Acetaminophen Itching   Rofecoxib Diarrhea   Spironolactone Other (See Comments)   Furosemide Other (See Comments)    Rash and extreme dehydration   Sulfa Antibiotics Rash     Assessment/Plan:  1. Hypertension - BP near goal <130/80 mmHg, home readings averaging 125/87. Diastolic BP continues to remain  elevated. Will increase valsartan to 160mg  daily and continue amlodipine 7.5 mg daily, spironolactone 50 mg daily, and Bystolic 20 mg daily. Encouraged patient to continue checking BP daily and work with [facility] for diet and exercise management. Patient verbalized understanding. Follow-up appointment scheduled in 4 weeks.   2. Weight loss/pre-diabetes -  Pt has been tolerating Ozempic dose titration well. Her A1c has also decreased from high of 6.6% in 2020 down to 5.8% on lower dose of Ozempic. Anticipate A1c may decrease back to normal range with continued Ozempic titration. Will increase to maintenance dose of Ozempic 2mg  weekly to aid with continued weight loss efforts. Pt has lost 24 lbs so far since starting Ozempic - current weight 262 lbs (baseline 286 lbs). Encouraged patient to continue to work with [facility] for diet and exercise management.   Amal Saiki E. Shanikia [employer], PharmD, BCACP, [place] [address], [place], [place] [address] Phone: [phone]; Fax: [phone] 10/06/2020 11:28 AM

## 2020-10-07 ENCOUNTER — Telehealth: Payer: Medicare Other

## 2020-10-07 ENCOUNTER — Telehealth: Payer: Self-pay

## 2020-10-07 NOTE — Telephone Encounter (Signed)
  Care Management   Follow Up Note   10/07/2020 Name: Brandi [name] MRN: [id number] DOB: 24-Oct-1955   Referred by: [name], FNP Reason for referral : Chronic Care Management (RN CM Follow up call )   An unsuccessful telephone outreach was attempted today. The patient was referred to the case management team for assistance with care management and care coordination.   Follow Up Plan: A HIPPA compliant phone message was left for the patient providing contact information and requesting a return call.   Barb Merino, RN, BSN, CCM Care Management Coordinator [place] [facility]  Direct Phone: [phone]

## 2020-10-11 ENCOUNTER — Telehealth: Payer: Medicare Other

## 2020-10-11 ENCOUNTER — Telehealth: Payer: Self-pay

## 2020-10-11 NOTE — Telephone Encounter (Signed)
  Care Management   Follow Up Note   10/11/2020 Name: Brandi [name] MRN: [id number] DOB: 1956-01-23   Referred by: [name], FNP Reason for referral : Chronic Care Management (RN CM Follow up call )   An unsuccessful telephone outreach was attempted today. The patient was referred to the case management team for assistance with care management and care coordination.   Follow Up Plan: A HIPPA compliant phone message was left for the patient providing contact information and requesting a return call.   Barb Merino, RN, BSN, CCM Care Management Coordinator [facility] [facility]  Direct Phone: [phone] '

## 2020-10-12 ENCOUNTER — Other Ambulatory Visit: Payer: Self-pay

## 2020-10-12 ENCOUNTER — Ambulatory Visit (INDEPENDENT_AMBULATORY_CARE_PROVIDER_SITE_OTHER): Payer: Medicare Other | Admitting: Bariatrics

## 2020-10-12 ENCOUNTER — Encounter (INDEPENDENT_AMBULATORY_CARE_PROVIDER_SITE_OTHER): Payer: Self-pay | Admitting: Bariatrics

## 2020-10-12 VITALS — BP 122/77 | HR 107 | Temp 98.2°F | Ht 63.0 in | Wt 258.0 lb

## 2020-10-12 DIAGNOSIS — I10 Essential (primary) hypertension: Secondary | ICD-10-CM

## 2020-10-12 DIAGNOSIS — R7303 Prediabetes: Secondary | ICD-10-CM

## 2020-10-12 DIAGNOSIS — Z6841 Body Mass Index (BMI) 40.0 and over, adult: Secondary | ICD-10-CM | POA: Diagnosis not present

## 2020-10-18 ENCOUNTER — Encounter (INDEPENDENT_AMBULATORY_CARE_PROVIDER_SITE_OTHER): Payer: Self-pay | Admitting: Bariatrics

## 2020-10-18 NOTE — Progress Notes (Signed)
Chief Complaint:   OBESITY Brandi [name] is here to discuss her progress with her obesity treatment plan along with follow-up of her obesity related diagnoses. Brandi [name] is on the Category 1 Plan and states she is following her eating plan approximately 100% of the time. Brandi [name] states she is walking up and down the stairs during commercials.  Today's visit was #: 3 Starting weight: 273 lbs Starting date: 08/25/2020 Today's weight: 258 lbs Today's date: 10/12/2020 Total lbs lost to date: 15 Total lbs lost since last in-office visit: 4  Interim History: Brandi [name] is down an additional 4 lbs since her last visit. She had oral surgery and is getting dentures. She has been on a soft diet.  Subjective:   1. Essential hypertension Controlled. Brandi [name] is taking Norvasc and Bystolic.  BP Readings from Last 3 Encounters:  10/12/20 122/77  10/06/20 (!) 120/92  09/29/20 125/88   Lab Results  Component Value Date   CREATININE 1.07 (H) 08/10/2020   CREATININE 1.04 (H) 08/02/2020   CREATININE 0.80 06/30/2020   2. Pre-diabetes Brandi [name] is taking Ozempic.  Lab Results  Component Value Date   HGBA1C 5.8 (H) 08/25/2020   Lab Results  Component Value Date   INSULIN 19.6 08/25/2020   Assessment/Plan:   1. Essential hypertension Brandi [name] is working on healthy weight loss and exercise to improve blood pressure control. We will watch for [name] of hypotension as she continues her lifestyle modifications. Continue current treatment plan.  2. Pre-diabetes Brandi [name] will continue to work on weight loss, exercise, and decreasing simple carbohydrates to help decrease the risk of diabetes. Continue current treatment plan.  3. Obesity, current BMI 45.8  Brandi [name] is currently in the action stage of change. As such, her goal is to continue with weight loss efforts. She has agreed to the Category 1 Plan.   Meal planning Mindful eating Keep water intake high  Exercise goals:  As is  Behavioral modification strategies:  increasing lean protein intake, decreasing simple carbohydrates, increasing vegetables, increasing water intake, decreasing eating out, no skipping meals, meal planning and cooking strategies, keeping healthy foods in the home, and planning for success.  Brandi [name] has agreed to follow-up with our clinic in 2 weeks. She was informed of the importance of frequent follow-up visits to maximize her success with intensive lifestyle modifications for her multiple health conditions.   Objective:   Blood pressure 122/77, pulse (!) 107, temperature 98.2 F (36.8 C), height '5\' 3"'$  (1.6 m), weight 258 lb (117 kg), SpO2 98 %. Body mass index is 45.7 kg/m.  General: Cooperative, alert, well developed, in no acute distress. Using a cane with 3 points for ambulation. HEENT: Conjunctivae and lids unremarkable. Cardiovascular: Regular rhythm.  Lungs: Normal work of breathing. Neurologic: No focal deficits.   Lab Results  Component Value Date   CREATININE 1.07 (H) 08/10/2020   BUN 15 08/10/2020   NA 141 08/10/2020   K 4.5 08/10/2020   CL 104 08/10/2020   CO2 23 08/10/2020   Lab Results  Component Value Date   ALT 14 10/09/2019   AST 18 10/09/2019   ALKPHOS 200 (H) 10/09/2019   BILITOT 0.4 10/09/2019   Lab Results  Component Value Date   HGBA1C 5.8 (H) 08/25/2020   HGBA1C 6.0 (H) 06/30/2020   HGBA1C 6.0 (H) 03/11/2020   HGBA1C 6.1 (H) 10/09/2019   HGBA1C 6.6 (H) 01/06/2019   Lab Results  Component Value Date   INSULIN 19.6 08/25/2020   Lab Results  Component Value  Date   TSH 1.880 08/25/2020   Lab Results  Component Value Date   CHOL 123 08/25/2020   HDL 47 08/25/2020   LDLCALC 58 08/25/2020   TRIG 97 08/25/2020   CHOLHDL 2.6 03/11/2020   Lab Results  Component Value Date   VD25OH 105.0 (H) 08/25/2020   Lab Results  Component Value Date   WBC 7.2 01/27/2019   HGB 12.4 01/27/2019   HCT 38.4 01/27/2019   MCV 90 01/27/2019   PLT 275 01/27/2019   No results found for: IRON,  TIBC, FERRITIN  Obesity Behavioral Intervention:   Approximately 15 minutes were spent on the discussion below.  ASK: We discussed the diagnosis of obesity with Brandi [name] today and Brandi [name] agreed to give [name] permission to discuss obesity behavioral modification therapy today.  ASSESS: Brandi [name] has the diagnosis of obesity and her BMI today is 45.8. Brandi [name] is in the action stage of change.   ADVISE: Brandi [name] was educated on the multiple health risks of obesity as well as the benefit of weight loss to improve her health. She was advised of the need for long term treatment and the importance of lifestyle modifications to improve her current health and to decrease her risk of future health problems.  AGREE: Multiple dietary modification options and treatment options were discussed and Brandi [name] agreed to follow the recommendations documented in the above note.  ARRANGE: Brandi [name] was educated on the importance of frequent visits to treat obesity as outlined per CMS and USPSTF guidelines and agreed to schedule her next follow up appointment today.  Attestation Statements:   Reviewed by clinician on day of visit: allergies, medications, problem list, medical history, surgical history, family history, social history, and previous encounter notes.  Coral Ceo, CMA, am acting as Location manager for [employer], DO.  I have reviewed the above documentation for accuracy and completeness, and I agree with the above. [name], DO

## 2020-10-26 ENCOUNTER — Other Ambulatory Visit: Payer: Self-pay

## 2020-10-26 ENCOUNTER — Encounter (INDEPENDENT_AMBULATORY_CARE_PROVIDER_SITE_OTHER): Payer: Self-pay | Admitting: Bariatrics

## 2020-10-26 ENCOUNTER — Ambulatory Visit (INDEPENDENT_AMBULATORY_CARE_PROVIDER_SITE_OTHER): Payer: Medicare Other | Admitting: Bariatrics

## 2020-10-26 VITALS — BP 97/66 | HR 88 | Temp 98.1°F | Ht 63.0 in | Wt 255.0 lb

## 2020-10-26 DIAGNOSIS — R7303 Prediabetes: Secondary | ICD-10-CM

## 2020-10-26 DIAGNOSIS — Z6841 Body Mass Index (BMI) 40.0 and over, adult: Secondary | ICD-10-CM | POA: Diagnosis not present

## 2020-10-26 DIAGNOSIS — E7849 Other hyperlipidemia: Secondary | ICD-10-CM

## 2020-10-27 NOTE — Progress Notes (Signed)
Chief Complaint:   OBESITY Acelynn is here to discuss her progress with her obesity treatment plan along with follow-up of her obesity related diagnoses. Kassondra is on the Category 1 Plan and states she is following her eating plan approximately 90% of the time. Elliotte states she is doing 0 minutes 0 times per week.  Today's visit was #: 4 Starting weight: 273 lbs Starting date: 08/25/2020 Today's weight: 255 lbs Today's date: 10/26/2020 Total lbs lost to date: 18 lbs Total lbs lost since last in-office visit: 3 lbs  Interim History: Rheba is down an additional 3 lbs since her last visit.  Subjective:   1. Pre-diabetes Sue is taking Ozempic.  2. Other hyperlipidemia Clarrisa is taking Lipitor.  Assessment/Plan:   1. Pre-diabetes Britta will continue taking Lipitor. She will increase Omega 3 to Omega 6 ration. She will continue to work on weight loss, exercise, and decreasing simple carbohydrates to help decrease the risk of diabetes.    2. Other hyperlipidemia Kristiona will continue taking Ozempic. She will continue to decrease carbohydrates. Cardiovascular risk and specific lipid/LDL goals reviewed.  We discussed several lifestyle modifications today and Rushelle will continue to work on diet, exercise and weight loss efforts. Orders and follow up as documented in patient record.   Counseling Intensive lifestyle modifications are the first line treatment for this issue. Dietary changes: Increase soluble fiber. Decrease simple carbohydrates. Exercise changes: Moderate to vigorous-intensity aerobic activity 150 minutes per week if tolerated. Lipid-lowering medications: see documented in medical record.   3. Obesity, current BMI 45.2 Jaiyana is currently in the action stage of change. As such, her goal is to continue with weight loss efforts. She has agreed to the Category 1 Plan.   Exercise goals: Kairy's sciatic nerve has been acting up. She is doing her steps.  Behavioral  modification strategies: increasing lean protein intake, decreasing simple carbohydrates, increasing vegetables, increasing water intake, decreasing eating out, no skipping meals, meal planning and cooking strategies, keeping healthy foods in the home, and planning for success.  Everlie has agreed to follow-up with our clinic in 2 weeks. She was informed of the importance of frequent follow-up visits to maximize her success with intensive lifestyle modifications for her multiple health conditions.   Objective:   Blood pressure 97/66, pulse 88, temperature 98.1 F (36.7 C), height '5\' 3"'$  (1.6 m), weight 255 lb (115.7 kg), SpO2 98 %. Body mass index is 45.17 kg/m.  General: Cooperative, alert, well developed, in no acute distress. HEENT: Conjunctivae and lids unremarkable. Cardiovascular: Regular rhythm.  Lungs: Normal work of breathing. Neurologic: No focal deficits.   Lab Results  Component Value Date   CREATININE 1.07 (H) 08/10/2020   BUN 15 08/10/2020   NA 141 08/10/2020   K 4.5 08/10/2020   CL 104 08/10/2020   CO2 23 08/10/2020   Lab Results  Component Value Date   ALT 14 10/09/2019   AST 18 10/09/2019   ALKPHOS 200 (H) 10/09/2019   BILITOT 0.4 10/09/2019   Lab Results  Component Value Date   HGBA1C 5.8 (H) 08/25/2020   HGBA1C 6.0 (H) 06/30/2020   HGBA1C 6.0 (H) 03/11/2020   HGBA1C 6.1 (H) 10/09/2019   HGBA1C 6.6 (H) 01/06/2019   Lab Results  Component Value Date   INSULIN 19.6 08/25/2020   Lab Results  Component Value Date   TSH 1.880 08/25/2020   Lab Results  Component Value Date   CHOL 123 08/25/2020   HDL 47 08/25/2020  LDLCALC 58 08/25/2020   TRIG 97 08/25/2020   CHOLHDL 2.6 03/11/2020   Lab Results  Component Value Date   VD25OH 105.0 (H) 08/25/2020   Lab Results  Component Value Date   WBC 7.2 01/27/2019   HGB 12.4 01/27/2019   HCT 38.4 01/27/2019   MCV 90 01/27/2019   PLT 275 01/27/2019   No results found for: IRON, TIBC,  FERRITIN  Obesity Behavioral Intervention:   Approximately 15 minutes were spent on the discussion below.  ASK: We discussed the diagnosis of obesity with [name] today and Gabryelle agreed to give [name] permission to discuss obesity behavioral modification therapy today.  ASSESS: Shanette has the diagnosis of obesity and her BMI today is 45.2. Marelly is in the action stage of change.   ADVISE: Evilyn was educated on the multiple health risks of obesity as well as the benefit of weight loss to improve her health. She was advised of the need for long term treatment and the importance of lifestyle modifications to improve her current health and to decrease her risk of future health problems.  AGREE: Multiple dietary modification options and treatment options were discussed and Aynslie agreed to follow the recommendations documented in the above note.  ARRANGE: Bronda was educated on the importance of frequent visits to treat obesity as outlined per CMS and USPSTF guidelines and agreed to schedule her next follow up appointment today.  Attestation Statements:   Reviewed by clinician on day of visit: allergies, medications, problem list, medical history, surgical history, family history, social history, and previous encounter notes.  I, [name], RMA, am acting as Location manager for [employer], DO.   I have reviewed the above documentation for accuracy and completeness, and I agree with the above. [name], DO

## 2020-10-28 ENCOUNTER — Encounter (INDEPENDENT_AMBULATORY_CARE_PROVIDER_SITE_OTHER): Payer: Self-pay | Admitting: Bariatrics

## 2020-11-02 ENCOUNTER — Ambulatory Visit (INDEPENDENT_AMBULATORY_CARE_PROVIDER_SITE_OTHER): Payer: Medicare Other | Admitting: Nurse Practitioner

## 2020-11-02 ENCOUNTER — Encounter: Payer: Self-pay | Admitting: Nurse Practitioner

## 2020-11-02 ENCOUNTER — Other Ambulatory Visit: Payer: Self-pay

## 2020-11-02 VITALS — BP 134/82 | HR 88 | Temp 98.1°F | Ht 63.0 in | Wt 257.8 lb

## 2020-11-02 DIAGNOSIS — I10 Essential (primary) hypertension: Secondary | ICD-10-CM

## 2020-11-02 DIAGNOSIS — Z23 Encounter for immunization: Secondary | ICD-10-CM

## 2020-11-02 DIAGNOSIS — R7303 Prediabetes: Secondary | ICD-10-CM | POA: Diagnosis not present

## 2020-11-02 DIAGNOSIS — M5431 Sciatica, right side: Secondary | ICD-10-CM

## 2020-11-02 DIAGNOSIS — Z1211 Encounter for screening for malignant neoplasm of colon: Secondary | ICD-10-CM

## 2020-11-02 MED ORDER — SHINGRIX 50 MCG/0.5ML IM SUSR: 0.5000 mL | Freq: Once | INTRAMUSCULAR | 0 refills | Status: AC

## 2020-11-02 MED ORDER — TRAMADOL HCL 50 MG PO TABS: 50.0000 mg | ORAL_TABLET | Freq: Four times a day (QID) | ORAL | 0 refills | Status: DC | PRN

## 2020-11-02 MED ORDER — TETANUS-DIPHTH-ACELL PERTUSSIS 5-2.5-18.5 LF-MCG/0.5 IM SUSP: 0.5000 mL | Freq: Once | INTRAMUSCULAR | 0 refills | Status: AC

## 2020-11-02 MED ORDER — TRIAMCINOLONE ACETONIDE 40 MG/ML IJ SUSP
40.0000 mg | Freq: Once | INTRAMUSCULAR | Status: AC
  Administered 2020-11-02: 40 mg via INTRAMUSCULAR

## 2020-11-02 NOTE — Progress Notes (Signed)
I,Brandi [name],acting as a Education administrator for Brandi Brine, FNP.,have documented all relevant documentation on the behalf of Brandi Brine, FNP,as directed by  Brandi Brine, FNP while in the presence of Brandi [name], [place].  This visit occurred during the SARS-CoV-2 public health emergency.  Safety protocols were in place, including screening questions prior to the visit, additional usage of staff PPE, and extensive cleaning of exam room while observing appropriate contact time as indicated for disinfecting solutions.  Subjective:     Patient ID: Brandi [name] , female    DOB: July 05, 1955 , 65 y.o.   MRN: [id number]   Chief Complaint  Patient presents with   Hypertension    HPI  Pt is here today for a BPC and DM f/u . She would like to discuss certain immunizations due. TDAP & 4th covid booster. Pt does experience sciatic nerve pain today 9/10, she went to see Dr. Owens Shark (Chronic conditioning program) - she has been having more issues with the pain. She has been focusing on losing weight. She has lost 15 lbs and is [facility] to [facility] She has been taking 1000 mg tylenol 1-2 times a day. She has not been getting out of the bed over the last few days.   Hypertension This is a chronic problem. The current episode started more than 1 year ago. The problem has been gradually improving since onset. The problem is uncontrolled. Pertinent negatives include no anxiety, blurred vision, chest pain, headaches or palpitations. Risk factors for coronary artery disease include obesity, dyslipidemia and sedentary lifestyle. Past treatments include calcium channel blockers. There are no compliance problems.  There is no history of angina. There is no history of chronic renal disease.  Diabetes She presents for her follow-up diabetic visit. She has type 2 diabetes mellitus. Her disease course has been stable. Pertinent negatives for hypoglycemia include no dizziness or headaches. Pertinent negatives for  diabetes include no blurred vision, no chest pain, no fatigue, no polydipsia, no polyphagia and no polyuria. Symptoms are stable. There are no diabetic complications. Risk factors for coronary artery disease include dyslipidemia, obesity and sedentary lifestyle. Current diabetic treatment includes diet. She is compliant with treatment all of the time. Home blood sugar record trend: fluctuating. (Range 107- 158 )    Past Medical History:  Diagnosis Date   Anemia    Arthritis    Back pain    Edema, lower extremity    Food allergy    GERD (gastroesophageal reflux disease)    Hypertension    Hypertensive crisis    Joint pain    Morbid obesity ([place])    Osteoporosis    Pre-diabetes    Rheumatoid arthritis ([place])    Sciatica neuralgia    Sleep apnea      Family History  Problem Relation Age of Onset   Hypertension Mother    Cancer Mother    Heart disease Mother    Eating disorder Mother    Obesity Mother    Kidney disease Father    Heart disease Father    Diabetes Father    High blood pressure Father    High Cholesterol Father    Anxiety disorder Father    Bipolar disorder Father    Schizophrenia Father    Hypertension Sister      Current Outpatient Medications:    atorvastatin (LIPITOR) 40 MG tablet, TAKE 1 TABLET BY MOUTH EVERY DAY, Disp: 90 tablet, Rfl: 3   Biotin 1000 MCG tablet, Take 1,000 mcg  by mouth daily., Disp: , Rfl:    BYSTOLIC 20 MG TABS, Take 1 tablet by mouth once daily, Disp: 90 tablet, Rfl: 3   cetirizine (ZYRTEC) 10 MG tablet, Take 10 mg by mouth daily., Disp: , Rfl:    fluticasone (FLONASE) 50 MCG/ACT nasal spray, Flonase Allergy Relief 50 mcg/actuation nasal spray,suspension  INSTILL 2 SPRAYS IN EACH NOSTRIL QD, Disp: , Rfl:    Magnesium 500 MG TABS, Take 1 tablet by mouth daily., Disp: , Rfl:    omeprazole (PRILOSEC) 20 MG capsule, Take 20 mg by mouth daily., Disp: , Rfl:    PRESCRIPTION MEDICATION, STRENGTIA '30MG'$  BID, Disp: , Rfl:    PRESCRIPTION  MEDICATION, HEPATO-CI BID, Disp: , Rfl:    PRESCRIPTION MEDICATION, OMEGA CO3-SE BID, Disp: , Rfl:    PRESCRIPTION MEDICATION, b12 GD ONE DROPPER, Disp: , Rfl:    Semaglutide, 2 MG/DOSE, (OZEMPIC, 2 MG/DOSE,) 8 MG/3ML SOPN, Inject 2 mg into the skin once a week., Disp: 2 mL, Rfl: 11   topiramate (TOPAMAX) 50 MG tablet, Take 1 tablet (50 mg total) by mouth 2 (two) times daily., Disp: 180 tablet, Rfl: 3   traMADol (ULTRAM) 50 MG tablet, Take 1 tablet (50 mg total) by mouth every 6 (six) hours as needed., Disp: 20 tablet, Rfl: 0   Vitamin E 180 MG CAPS, Take 1 capsule by mouth daily., Disp: , Rfl:    amLODipine (NORVASC) 5 MG tablet, Take 1.5 tablets (7.5 mg total) by mouth daily., Disp: 135 tablet, Rfl: 3   cyclobenzaprine (FLEXERIL) 10 MG tablet, TAKE 1 TABLET(10 MG) BY MOUTH THREE TIMES DAILY, Disp: 270 tablet, Rfl: 1   spironolactone (ALDACTONE) 50 MG tablet, Take 1 tablet (50 mg total) by mouth daily., Disp: 90 tablet, Rfl: 3   valsartan (DIOVAN) 160 MG tablet, Take 1 tablet (160 mg total) by mouth daily., Disp: 90 tablet, Rfl: 3   Allergies  Allergen Reactions   Hydralazine Itching   Meperidine Hcl Other (See Comments)   Aliskiren Hives   Aspirin Nausea Only   Chocolate    Juniper Berries    Latex     hives   Oxycodone-Acetaminophen Itching   Rofecoxib Diarrhea   Spironolactone Other (See Comments)   Furosemide Other (See Comments)    Rash and extreme dehydration   Sulfa Antibiotics Rash     Review of Systems  Constitutional: Negative.  Negative for fatigue.  Eyes:  Negative for blurred vision.  Respiratory: Negative.    Cardiovascular: Negative.  Negative for chest pain, palpitations and leg swelling.  Endocrine: Negative for polydipsia, polyphagia and polyuria.  Neurological: Negative.  Negative for dizziness and headaches.  Psychiatric/Behavioral: Negative.      Today's Vitals   11/02/20 1109  BP: 134/82  Pulse: 88  Temp: 98.1 F (36.7 C)  Weight: 257 lb 12.8 oz  (116.9 kg)  Height: '5\' 3"'$  (1.6 m)  PainSc: 9    Body mass index is 45.67 kg/m.  Wt Readings from Last 3 Encounters:  11/10/20 252 lb (114.3 kg)  11/04/20 256 lb (116.1 kg)  11/02/20 257 lb 12.8 oz (116.9 kg)    Objective:  Physical Exam Vitals reviewed.  Constitutional:      General: She is not in acute distress.    Appearance: Normal appearance. She is obese.  Eyes:     Pupils: Pupils are equal, round, and reactive to light.  Cardiovascular:     Rate and Rhythm: Normal rate and regular rhythm.     Pulses: Normal  pulses.     Heart sounds: Normal heart sounds. No murmur heard. Pulmonary:     Effort: Pulmonary effort is normal. No respiratory distress.     Breath sounds: Normal breath sounds. No wheezing.  Musculoskeletal:     Right lower leg: No edema.     Left lower leg: No edema.  Skin:    General: Skin is warm and dry.     Capillary Refill: Capillary refill takes less than 2 seconds.  Neurological:     General: No focal deficit present.     Mental Status: She is alert and oriented to person, place, and time.     Cranial Nerves: No cranial nerve deficit.     Motor: No weakness.  Psychiatric:        Mood and Affect: Mood normal.        Behavior: Behavior normal.        Thought Content: Thought content normal.        Judgment: Judgment normal.        Assessment And Plan:     1. Essential hypertension Comments: Stable, continue current medications  2. Prediabetes Comments: Stable, no current medications Encouraged to continue healthy diet  3. Sciatic pain, right Comments: She is having significant pain will treat with Kenalog Encouraged to stretch regularly - triamcinolone acetonide (KENALOG-40) injection 40 mg  4. Morbid obesity ([place]) Continue follow up with [facility] she is doing well  5. Encounter for screening colonoscopy According to USPTF Colorectal cancer Screening guidelines. Colonoscopy is recommended every 10 years, starting at  age 74years. Will refer to GI for colon cancer screening. - Ambulatory referral to Gastroenterology  6. Encounter for immunization - Zoster Vaccine Adjuvanted [facility]) injection; Inject 0.5 mLs into the muscle once for 1 dose.  Dispense: 0.5 mL; Refill: 0 - Tdap (BOOSTRIX) 5-2.5-18.5 LF-MCG/0.5 injection; Inject 0.5 mLs into the muscle once for 1 dose.  Dispense: 0.5 mL; Refill: 0    Patient was given opportunity to ask questions. Patient verbalized understanding of the plan and was able to repeat key elements of the plan. All questions were answered to their satisfaction.  Brandi Brine, FNP   I, Brandi Brine, FNP, have reviewed all documentation for this visit. The documentation on 11/18/20 for the exam, diagnosis, procedures, and orders are all accurate and complete.   IF YOU HAVE BEEN REFERRED TO A SPECIALIST, IT MAY TAKE 1-2 WEEKS TO SCHEDULE/PROCESS THE REFERRAL. IF YOU HAVE NOT HEARD FROM US/SPECIALIST IN TWO WEEKS, PLEASE GIVE [name] A CALL AT [phone].   THE PATIENT IS ENCOURAGED TO PRACTICE SOCIAL DISTANCING DUE TO THE COVID-19 PANDEMIC.

## 2020-11-02 NOTE — Patient Instructions (Signed)

## 2020-11-04 ENCOUNTER — Other Ambulatory Visit: Payer: Self-pay

## 2020-11-04 ENCOUNTER — Ambulatory Visit (INDEPENDENT_AMBULATORY_CARE_PROVIDER_SITE_OTHER): Payer: Medicare Other | Admitting: Pharmacist

## 2020-11-04 VITALS — BP 114/82 | HR 92 | Ht 63.0 in | Wt 256.0 lb

## 2020-11-04 DIAGNOSIS — R7303 Prediabetes: Secondary | ICD-10-CM | POA: Diagnosis not present

## 2020-11-04 DIAGNOSIS — I10 Essential (primary) hypertension: Secondary | ICD-10-CM | POA: Diagnosis not present

## 2020-11-04 LAB — BASIC METABOLIC PANEL
BUN/Creatinine Ratio: 14 (ref 12–28)
BUN: 16 mg/dL (ref 8–27)
CO2: 16 mmol/L — ABNORMAL LOW (ref 20–29)
Calcium: 10.2 mg/dL (ref 8.7–10.3)
Chloride: 103 mmol/L (ref 96–106)
Creatinine, Ser: 1.12 mg/dL — ABNORMAL HIGH (ref 0.57–1.00)
Glucose: 95 mg/dL (ref 65–99)
Potassium: 4.4 mmol/L (ref 3.5–5.2)
Sodium: 138 mmol/L (ref 134–144)
eGFR: 54 mL/min/{1.73_m2} — ABNORMAL LOW (ref >59)

## 2020-11-04 MED ORDER — VALSARTAN 160 MG PO TABS: 160.0000 mg | ORAL_TABLET | Freq: Every day | ORAL | 3 refills | Status: DC

## 2020-11-04 MED ORDER — SPIRONOLACTONE 50 MG PO TABS: 50.0000 mg | ORAL_TABLET | Freq: Every day | ORAL | 3 refills | Status: DC

## 2020-11-04 MED ORDER — AMLODIPINE BESYLATE 5 MG PO TABS: 7.5000 mg | ORAL_TABLET | Freq: Every day | ORAL | 3 refills | Status: DC

## 2020-11-04 NOTE — Patient Instructions (Signed)
It was nice to see you today  Continue taking your current medications, your blood pressure is at goal < 130/74mHg  Call clinic with any concerns #[phone]

## 2020-11-04 NOTE — Progress Notes (Signed)
Patient ID: [name]                 DOB: Nov 15, 1955                      MRN: [id number]     HPI: Brandi [name] is a 65 y.o. female referred by Dr. Tamala Julian to HTN clinic. PMH is significant for severe hypertension, hypertensive heart disease, congestive heart failure (EF 55-60% in 2020), obstructive sleep apnea, multiple medication intolerances, significant bilateral lower extremity edema, and morbid obesity, diabetes with A1c up to 6.6 back in 2020, now down to 5.8 with Ozempic. Pt has followed in PharmD clinic for both HTN and GLP1RA therapy for weight loss over the past year. Most recently, valsartan dose was increased to '160mg'$  daily as her diastolic BP remained elevated. Her Ozempic was also titrated up to maintenance dose of '2mg'$  weekly to aid with weight loss and A1c control.  Patient presents today in good spirits ambulating with a cane. Reports medication adherence with HTN medications. Elevates her legs when she sits, does not report any recent LE edema. Denies dizziness, headaches (on topiramate, working well). Does have some balance issues secondary to sciatica issues, has been working on this.Checks BP once daily, provides detailed log today. Tolerating Ozempic '2mg'$  well, just gave 4th injection of this dose earlier this week. Baseline weight 287 lbs (BMI 49), now down to 256 lbs today (BMI 45).  Current HTN meds:  Valsartan 160 mg daily (AM) Amlodipine 7.5 mg daily (PM) Spironolactone 50 mg daily (AM) Bystolic 20 mg daily (AM)   Previously tried: hydralazine (itching), Aliskiren (hives), Lasix (rash and extreme dehydration), carvedilol and metoprolol (hives), propranolol (vomiting)    BP goal: <130/80 mmHg  Family History: Diabetes in her father; Heart disease in her father and mother; Hypertension in her mother and sister; Kidney disease in her father.   Social History: denies tobacco use; drinks alcohol rarely   Diet: working with [facility] - Category  diet 1 plan: focuses on vegetables, lean meat, and fruits; can eat healthy choice meals, lean cuisine, or smart choice meals; can drink water, juice, premium protein, now limits soda; avoiding salt and caffeine.  Exercise: limited due to cane but plans to work with weight loss clinic  Home BP readings: Checks between 1-2pm  Date SBP DBP Pulse  17-Jul 130 82 84  18-Jul 132 90 86  19-Jul 122 72 86  20-Jul 123 92 91  21-Jul 120 88 86  23-Jul 122 88 82  24-Jul 126 86 84  25-Jul 115 84 81  26-Jul 128 88 -  27-Jul 119 95 90  28-Jul 108 93 85  29-Jul 122 82 88  1-Aug 121 88 86  2-Aug 97 66 86  3-Aug 122 78 80  4-Aug 118 81 86  5-Aug 106 76 87  6-Aug 108 88 82  7-Aug 102 70 76  8-Aug 110 78 70  9-Aug 134 82 88  10-Aug 128 78 76  AVG 119 83 84    Wt Readings from Last 3 Encounters:  11/02/20 257 lb 12.8 oz (116.9 kg)  10/26/20 255 lb (115.7 kg)  10/12/20 258 lb (117 kg)   BP Readings from Last 3 Encounters:  11/02/20 134/82  10/26/20 97/66  10/12/20 122/77   Pulse Readings from Last 3 Encounters:  11/02/20 88  10/26/20 88  10/12/20 (!) 107    Renal function: CrCl cannot be calculated (Patient's most recent  lab result is older than the maximum 21 days allowed.).  Past Medical History:  Diagnosis Date   Anemia    Arthritis    Back pain    Edema, lower extremity    Food allergy    GERD (gastroesophageal reflux disease)    Hypertension    Hypertensive crisis    Joint pain    Morbid obesity ([place])    Osteoporosis    Pre-diabetes    Rheumatoid arthritis ([place])    Sciatica neuralgia    Sleep apnea     Current Outpatient Medications on File Prior to Visit  Medication Sig Dispense Refill   amLODipine (NORVASC) 5 MG tablet Take 1.5 tablets (7.5 mg total) by mouth daily. 45 tablet 2   atorvastatin (LIPITOR) 40 MG tablet TAKE 1 TABLET BY MOUTH EVERY DAY 90 tablet 3   Biotin 1000 MCG tablet Take 1,000 mcg by mouth daily.     BYSTOLIC 20 MG TABS Take 1 tablet by  mouth once daily 90 tablet 3   cetirizine (ZYRTEC) 10 MG tablet Take 10 mg by mouth daily.     cyclobenzaprine (FLEXERIL) 10 MG tablet TAKE 1 TABLET(10 MG) BY MOUTH THREE TIMES DAILY 270 tablet 1   fluticasone (FLONASE) 50 MCG/ACT nasal spray Flonase Allergy Relief 50 mcg/actuation nasal spray,suspension  INSTILL 2 SPRAYS IN EACH NOSTRIL QD     Magnesium 500 MG TABS Take 1 tablet by mouth daily.     omeprazole (PRILOSEC) 20 MG capsule Take 20 mg by mouth daily.     PRESCRIPTION MEDICATION STRENGTIA '30MG'$  BID     PRESCRIPTION MEDICATION HEPATO-CI BID     PRESCRIPTION MEDICATION OMEGA CO3-SE BID     PRESCRIPTION MEDICATION b12 GD ONE DROPPER     Semaglutide, 2 MG/DOSE, (OZEMPIC, 2 MG/DOSE,) 8 MG/3ML SOPN Inject 2 mg into the skin once a week. 2 mL 11   spironolactone (ALDACTONE) 50 MG tablet Take 1 tablet (50 mg total) by mouth daily. 90 tablet 3   topiramate (TOPAMAX) 50 MG tablet Take 1 tablet (50 mg total) by mouth 2 (two) times daily. 180 tablet 3   traMADol (ULTRAM) 50 MG tablet Take 1 tablet (50 mg total) by mouth every 6 (six) hours as needed. 20 tablet 0   valsartan (DIOVAN) 160 MG tablet Take 1 tablet (160 mg total) by mouth daily. 30 tablet 5   Vitamin E 180 MG CAPS Take 1 capsule by mouth daily.     No current facility-administered medications on file prior to visit.    Allergies  Allergen Reactions   Hydralazine Itching   Meperidine Hcl Other (See Comments)   Aliskiren Hives   Aspirin Nausea Only   Chocolate    Juniper Berries    Latex     hives   Oxycodone-Acetaminophen Itching   Rofecoxib Diarrhea   Spironolactone Other (See Comments)   Furosemide Other (See Comments)    Rash and extreme dehydration   Sulfa Antibiotics Rash     Assessment/Plan:  1. Hypertension - BP has improved and is now at goal goal <130/80 mmHg, home readings averaging 119/83. Will check BMET today with recent dose increase of valsartan. Continue valsartan '160mg'$  daily, amlodipine 7.5 mg daily,  spironolactone 50 mg daily, and Bystolic 20 mg daily. Encouraged patient to continue checking BP daily and work with [facility] for diet and exercise management. Patient verbalized understanding. Follow-up with PharmD as needed.  2. Weight loss/pre-diabetes - Pt has been tolerating Ozempic dose titration well,  now on maintenance dose of '2mg'$  weekly. Her A1c has also decreased from high of 6.6% in 2020 down to 5.8% since starting therapy. Pt has lost over 30 lbs since starting Ozempic - baseline weight 287 lbs (BMI 49), now down to 256 lbs today (BMI 45). Encouraged patient to continue to work with [facility] for diet and exercise management.   Brandi [name] E. Nariyah Osias, PharmD, BCACP, [place] [id number], [place], [place] [address] Phone: [phone]; Fax: [phone] 11/04/2020 10:08 AM

## 2020-11-08 ENCOUNTER — Ambulatory Visit (INDEPENDENT_AMBULATORY_CARE_PROVIDER_SITE_OTHER): Payer: Medicare Other

## 2020-11-08 ENCOUNTER — Encounter: Payer: Self-pay | Admitting: Nurse Practitioner

## 2020-11-08 ENCOUNTER — Telehealth: Payer: Federal, State, Local not specified - PPO

## 2020-11-08 DIAGNOSIS — I10 Essential (primary) hypertension: Secondary | ICD-10-CM | POA: Diagnosis not present

## 2020-11-08 DIAGNOSIS — G4730 Sleep apnea, unspecified: Secondary | ICD-10-CM

## 2020-11-08 DIAGNOSIS — R7303 Prediabetes: Secondary | ICD-10-CM

## 2020-11-09 ENCOUNTER — Other Ambulatory Visit: Payer: Self-pay | Admitting: *Deleted

## 2020-11-09 ENCOUNTER — Telehealth: Payer: Self-pay | Admitting: Interventional Cardiology

## 2020-11-09 DIAGNOSIS — E878 Other disorders of electrolyte and fluid balance, not elsewhere classified: Secondary | ICD-10-CM

## 2020-11-09 MED ORDER — OZEMPIC (1 MG/DOSE) 4 MG/3ML ~~LOC~~ SOPN: 1.0000 mg | PEN_INJECTOR | SUBCUTANEOUS | 0 refills | Status: DC

## 2020-11-09 NOTE — Telephone Encounter (Signed)
Called pharmacy to see why they can't fill Ozempic '2mg'$ . They stated all doses of med are on backorder. Think they will be able to order the '1mg'$  dose today, but '2mg'$  dose isn't expected to be in before 9/30 apparently. Sent in 1 month rx for Ozempic '1mg'$  dose, pt advised to call pharmacy tomorrow to make sure they were able to get dose in stock. Will increase back to '2mg'$  once med able to be ordered.

## 2020-11-09 NOTE — Telephone Encounter (Signed)
Pt c/o medication issue:  1. Name of Medication:   Semaglutide, 2 MG/DOSE, (OZEMPIC, 2 MG/DOSE,) 8 MG/3ML SOPN    2. How are you currently taking this medication (dosage and times per day)? As prescribed, but is due today and she was unable to pick it up   3. Are you having a reaction (difficulty breathing--STAT)? No   4. What is your medication issue? States pharmacy is no longer able to get this medication through manufacturer. Requesting to speak with Jinny Blossom in regards to it.

## 2020-11-10 ENCOUNTER — Encounter (INDEPENDENT_AMBULATORY_CARE_PROVIDER_SITE_OTHER): Payer: Self-pay | Admitting: Bariatrics

## 2020-11-10 ENCOUNTER — Ambulatory Visit (INDEPENDENT_AMBULATORY_CARE_PROVIDER_SITE_OTHER): Payer: Medicare Other | Admitting: Bariatrics

## 2020-11-10 ENCOUNTER — Other Ambulatory Visit: Payer: Self-pay

## 2020-11-10 VITALS — BP 140/87 | HR 90 | Temp 98.7°F | Ht 63.0 in | Wt 252.0 lb

## 2020-11-10 DIAGNOSIS — Z6841 Body Mass Index (BMI) 40.0 and over, adult: Secondary | ICD-10-CM | POA: Diagnosis not present

## 2020-11-10 DIAGNOSIS — E7849 Other hyperlipidemia: Secondary | ICD-10-CM

## 2020-11-10 DIAGNOSIS — I10 Essential (primary) hypertension: Secondary | ICD-10-CM | POA: Diagnosis not present

## 2020-11-11 NOTE — Telephone Encounter (Signed)
Called pt to follow up, she states she picked up '1mg'$  of Ozempic yesterday. Advised her to call her pharmacy after she's given 2-3 injections to see if theyr'e able to order the '2mg'$  dose for next month. If not, she'll give me a call, may have better luck at an independent pharmacy.

## 2020-11-11 NOTE — Addendum Note (Signed)
Addended by: Shenia Alan E on: 11/11/2020 02:05 PM   Modules accepted: Orders

## 2020-11-11 NOTE — Progress Notes (Signed)
Chief Complaint:   OBESITY Brandi [name] is here to discuss her progress with her obesity treatment plan along with follow-up of her obesity related diagnoses. Brandi [name] is on the Category 1 Plan and states she is following her eating plan approximately 100% of the time. Brandi [name] states she is walking more.   Today's visit was #: 4 Starting weight: 273 lbs Starting date: 08/25/2020 Today's weight: 252 lbs Today's date:11/10/2020 Total lbs lost to date: 21 lbs Total lbs lost since last in-office visit: 3 lbs  Interim History: Brandi [name] is down 3 lbs. She fell earlier this week. She has new dentures and still adjusts.  Subjective:   1. Other hyperlipidemia Brandi [name] is currently taking Lipitor.  2. Essential hypertension Brandi [name] is currently taking Norvasc, Bystolic, Aldactone and Diovan. Her blood pressure is slightly elevated today.   Assessment/Plan:   1. Other hyperlipidemia Cardiovascular risk and specific lipid/LDL goals reviewed.  We discussed several lifestyle modifications today and Brandi [name] will continue to work on diet, exercise and weight loss efforts. Brandi [name] will continue Lipitor. Orders and follow up as documented in patient record.   Counseling Intensive lifestyle modifications are the first line treatment for this issue. Dietary changes: Increase soluble fiber. Decrease simple carbohydrates. Exercise changes: Moderate to vigorous-intensity aerobic activity 150 minutes per week if tolerated. Lipid-lowering medications: see documented in medical record.   2. Essential hypertension Brandi [name] will continue her medication. She is working on healthy weight loss and exercise to improve blood pressure control. We will watch for [name] of hypotension as she continues her lifestyle modifications.  3. Obesity, current BMI 44.7 Brandi [name] is currently in the action stage of change. As such, her goal is to continue with weight loss efforts. She has agreed to the Category 1 Plan.   Brandi [name] will continue meal  planning. She will continue intentional eating.  Exercise goals:  Brandi [name] is "sore" she will decrease steps.  Behavioral modification strategies: increasing lean protein intake, decreasing simple carbohydrates, increasing vegetables, increasing water intake, decreasing eating out, no skipping meals, meal planning and cooking strategies, keeping healthy foods in the home, and planning for success.  Brandi [name] has agreed to follow-up with our clinic in 2 weeks. She was informed of the importance of frequent follow-up visits to maximize her success with intensive lifestyle modifications for her multiple health conditions.   Objective:   Blood pressure 140/87, pulse 90, temperature 98.7 F (37.1 C), height '5\' 3"'$  (1.6 m), weight 252 lb (114.3 kg), SpO2 98 %. Body mass index is 44.64 kg/m. Brandi [name] is walking with a cane.  General: Cooperative, alert, well developed, in no acute distress. HEENT: Conjunctivae and lids unremarkable. Cardiovascular: Regular rhythm.  Lungs: Normal work of breathing. Neurologic: No focal deficits.   Lab Results  Component Value Date   CREATININE 1.12 (H) 11/04/2020   BUN 16 11/04/2020   NA 138 11/04/2020   K 4.4 11/04/2020   CL 103 11/04/2020   CO2 16 (L) 11/04/2020   Lab Results  Component Value Date   ALT 14 10/09/2019   AST 18 10/09/2019   ALKPHOS 200 (H) 10/09/2019   BILITOT 0.4 10/09/2019   Lab Results  Component Value Date   HGBA1C 5.8 (H) 08/25/2020   HGBA1C 6.0 (H) 06/30/2020   HGBA1C 6.0 (H) 03/11/2020   HGBA1C 6.1 (H) 10/09/2019   HGBA1C 6.6 (H) 01/06/2019   Lab Results  Component Value Date   INSULIN 19.6 08/25/2020   Lab Results  Component Value Date   TSH 1.880 08/25/2020  Lab Results  Component Value Date   CHOL 123 08/25/2020   HDL 47 08/25/2020   LDLCALC 58 08/25/2020   TRIG 97 08/25/2020   CHOLHDL 2.6 03/11/2020   Lab Results  Component Value Date   VD25OH 105.0 (H) 08/25/2020   Lab Results  Component Value Date   WBC  7.2 01/27/2019   HGB 12.4 01/27/2019   HCT 38.4 01/27/2019   MCV 90 01/27/2019   PLT 275 01/27/2019   No results found for: IRON, TIBC, FERRITIN  Obesity Behavioral Intervention:   Approximately 15 minutes were spent on the discussion below.  ASK: We discussed the diagnosis of obesity with Brandi [name] today and Brandi [name] agreed to give [name] permission to discuss obesity behavioral modification therapy today.  ASSESS: Brandi [name] has the diagnosis of obesity and her BMI today is 44.7. [place] is in the action stage of change.   ADVISE: Brandi [name] was educated on the multiple health risks of obesity as well as the benefit of weight loss to improve her health. She was advised of the need for long term treatment and the importance of lifestyle modifications to improve her current health and to decrease her risk of future health problems.  AGREE: Multiple dietary modification options and treatment options were discussed and Brandi [name] agreed to follow the recommendations documented in the above note.  ARRANGE: Brandi [name] was educated on the importance of frequent visits to treat obesity as outlined per CMS and USPSTF guidelines and agreed to schedule her next follow up appointment today.  Attestation Statements:   Reviewed by clinician on day of visit: allergies, medications, problem list, medical history, surgical history, family history, social history, and previous encounter notes.  I, [name], RMA, am acting as Location manager for [employer], DO.   I have reviewed the above documentation for accuracy and completeness, and I agree with the above. [name], DO

## 2020-11-14 ENCOUNTER — Other Ambulatory Visit: Payer: Self-pay | Admitting: Nurse Practitioner

## 2020-11-15 ENCOUNTER — Encounter (INDEPENDENT_AMBULATORY_CARE_PROVIDER_SITE_OTHER): Payer: Self-pay | Admitting: Bariatrics

## 2020-11-16 NOTE — Chronic Care Management (AMB) (Signed)
Chronic Care Management   CCM RN Visit Note  11/08/2020 Name: Brandi [name] MRN: [id number] DOB: 19-Mar-1956  Subjective: Brandi [name] is a 65 y.o. year old female who is a primary care patient of [name], [place]. The care management team was consulted for assistance with disease management and care coordination needs.    Engaged with patient by telephone for follow up visit in response to provider referral for case management and/or care coordination services.   Consent to Services:  The patient was given information about Chronic Care Management services, agreed to services, and gave verbal consent prior to initiation of services.  Please see initial visit note for detailed documentation.   Patient agreed to services and verbal consent obtained.   Assessment: Review of patient past medical history, allergies, medications, health status, including review of consultants reports, laboratory and other test data, was performed as part of comprehensive evaluation and provision of chronic care management services.   SDOH (Social Determinants of Health) assessments and interventions performed:  Yes, no acute needs   CCM Care Plan  Allergies  Allergen Reactions   Hydralazine Itching   Meperidine Hcl Other (See Comments)   Aliskiren Hives   Aspirin Nausea Only   Chocolate    Juniper Berries    Latex     hives   Oxycodone-Acetaminophen Itching   Rofecoxib Diarrhea   Spironolactone Other (See Comments)   Furosemide Other (See Comments)    Rash and extreme dehydration   Sulfa Antibiotics Rash    Outpatient Encounter Medications as of 11/08/2020  Medication Sig   amLODipine (NORVASC) 5 MG tablet Take 1.5 tablets (7.5 mg total) by mouth daily.   atorvastatin (LIPITOR) 40 MG tablet TAKE 1 TABLET BY MOUTH EVERY DAY   Biotin 1000 MCG tablet Take 1,000 mcg by mouth daily.   BYSTOLIC 20 MG TABS Take 1 tablet by mouth once daily   cetirizine (ZYRTEC) 10 MG tablet Take 10 mg by mouth  daily.   fluticasone (FLONASE) 50 MCG/ACT nasal spray Flonase Allergy Relief 50 mcg/actuation nasal spray,suspension  INSTILL 2 SPRAYS IN EACH NOSTRIL QD   Magnesium 500 MG TABS Take 1 tablet by mouth daily.   omeprazole (PRILOSEC) 20 MG capsule Take 20 mg by mouth daily.   PRESCRIPTION MEDICATION STRENGTIA 30MG BID   PRESCRIPTION MEDICATION HEPATO-CI BID   PRESCRIPTION MEDICATION OMEGA CO3-SE BID   PRESCRIPTION MEDICATION b12 GD ONE DROPPER   Semaglutide, 2 MG/DOSE, (OZEMPIC, 2 MG/DOSE,) 8 MG/3ML SOPN Inject 2 mg into the skin once a week.   spironolactone (ALDACTONE) 50 MG tablet Take 1 tablet (50 mg total) by mouth daily.   topiramate (TOPAMAX) 50 MG tablet Take 1 tablet (50 mg total) by mouth 2 (two) times daily.   traMADol (ULTRAM) 50 MG tablet Take 1 tablet (50 mg total) by mouth every 6 (six) hours as needed.   valsartan (DIOVAN) 160 MG tablet Take 1 tablet (160 mg total) by mouth daily.   Vitamin E 180 MG CAPS Take 1 capsule by mouth daily.   [DISCONTINUED] cyclobenzaprine (FLEXERIL) 10 MG tablet TAKE 1 TABLET(10 MG) BY MOUTH THREE TIMES DAILY   No facility-administered encounter medications on file as of 11/08/2020.    Patient Active Problem List   Diagnosis Date Noted   Prediabetes 10/09/2019   Essential hypertension 10/09/2019   Morbid obesity ([place]) 06/09/2019    Conditions to be addressed/monitored: HTN, sleep apnea, polypharmacy, prediabetes, Morbid Obesity   Care Plan : Prediabetes  Updates made by  Christina Gintz, Claudette Stapler, RN since 11/08/2020 12:00 AM     Problem: Disease Progression (Prediabetes)   Priority: Medium     Long-Range Goal: Disease Progression Prevented or Minimized   Start Date: 05/28/2020  Expected End Date: 05/28/2021  Recent Progress: On track  Priority: Medium  Note:   Objective:  Lab Results  Component Value Date   HGBA1C 5.8 (H) 08/25/2020   Lab Results  Component Value Date   CREATININE 1.12 (H) 11/04/2020   CREATININE 1.07 (H) 08/10/2020    CREATININE 1.04 (H) 08/02/2020   Lab Results  Component Value Date   EGFR 54 (L) 11/04/2020   Current Barriers:  Knowledge Deficits related to basic Diabetes pathophysiology and self care/management Knowledge Deficits related to medications used for management of diabetes Case Manager Clinical Goal(s):  patient will demonstrate improved adherence to prescribed treatment plan for diabetes self care/management as evidenced by: daily monitoring and recording of CBG  adherence to ADA/ carb modified diet exercise 5 days/week adherence to prescribed medication regimen contacting provider for new or worsened symptoms or questions Interventions:  11/08/20 completed successful outbound call with patient  Collaboration with [name], [place] regarding development and update of comprehensive plan of care as evidenced by provider attestation and co-signature Inter-disciplinary care team collaboration (see longitudinal plan of care) Provided education to patient about basic DM disease process Review of patient status, including review of consultant's reports, relevant laboratory and other test results, and medications completed. Reviewed medications with patient and discussed importance of medication adherence Educated patient on dietary and exercise recommendations; daily glycemic control FBS 80-130, <180 after meals;15'15' rule Advised patient, providing education and rationale, to check cbg daily before meals and at bedtime and record, calling the CCM team and or PCP for findings outside established parameters Reviewed scheduled/upcoming provider appointments including: next PCP follow up appointment scheduled for 03/15/21 _0 :00 AM  Mailed printed educational materials related to Low Carb Smoothies and Mediterranean diet  Discussed plans with patient for ongoing care management follow up and provided patient with direct contact information for care management team Self-Care Activities Self administers  oral medications as prescribed Self administers injectable DM medication (Ozempic) as prescribed Attends all scheduled provider appointments Checks blood sugars as prescribed and utilize hyper and hypoglycemia protocol as needed Adheres to prescribed ADA/carb modified Patient Goals: - check blood sugar at prescribed times - take the blood sugar meter to all doctor visits - drink 6 to 8 glasses of water each day - manage portion size  Follow Up Plan: Telephone follow up appointment with care management team member scheduled for: 03/17/21    Care Plan : Hypertension (Adult)  Updates made by Lynne Logan, RN since 11/08/2020 12:00 AM     Problem: Hypertension (Hypertension)   Priority: High     Long-Range Goal: Hypertension Monitored   Start Date: 05/28/2020  Expected End Date: 05/28/2021  Recent Progress: On track  Priority: High  Note:   Objective:  Last practice recorded BP readings:  BP Readings from Last 3 Encounters:  11/10/20 140/87  11/04/20 114/82  11/02/20 134/82   Most recent eGFR/CrCl:  Lab Results  Component Value Date   EGFR 54 (L) 11/04/2020    No components found for: CRCL Current Barriers:  Knowledge Deficits related to basic understanding of hypertension pathophysiology and self care management Knowledge Deficits related to understanding of medications prescribed for management of hypertension Case Manager Clinical Goal(s):  patient will demonstrate improved adherence to prescribed treatment plan for hypertension as  evidenced by taking all medications as prescribed, monitoring and recording blood pressure as directed, adhering to low sodium/DASH diet Interventions:  11/08/20 completed successful outbound call with patient  Collaboration with [name], [place] regarding development and update of comprehensive plan of care as evidenced by provider attestation and co-signature Inter-disciplinary care team collaboration (see longitudinal plan of  care) Evaluation of current treatment plan related to hypertension self management and patient's adherence to plan as established by provider. Provided education to patient re: stroke prevention, s/s of heart attack and stroke, DASH diet, complications of uncontrolled blood pressure Reviewed medications with patient and discussed importance of compliance Advised patient, providing education and rationale, to monitor blood pressure daily and record, calling PCP for findings outside established parameters.  Mailed printed educational material related to Why Should I Lower Sodium? Discussed plans with patient for ongoing care management follow up and provided patient with direct contact information for care management team Self-Care Activities: Self administers medications as prescribed Attends all scheduled provider appointments Calls provider office for new concerns, questions, or BP outside discussed parameters Checks BP and records as discussed Follows a low sodium diet/DASH diet Patient Goals: - check blood pressure 3 times per week - write blood pressure results in a log or diary - learn about high blood pressure  Follow Up Plan: Telephone follow up appointment with care management team member scheduled for: 03/17/21     Care Plan : Chronic Kidney (Adult)  Updates made by Lynne Logan, RN since 11/08/2020 12:00 AM     Problem: Disease Progression   Priority: High     Long-Range Goal: Disease Progression Prevented or Minimized   Start Date: 11/08/2020  Expected End Date: 11/08/2021  This Visit's Progress: On track  Priority: High  Note:   Current Barriers:  Ineffective Self Health Maintenance in a patient with HTN, sleep apnea, polypharmacy, prediabetes, Morbid Obesity  Clinical Goal(s):  Collaboration with [name], FNP regarding development and update of comprehensive plan of care as evidenced by provider attestation and co-signature Inter-disciplinary care team  collaboration (see longitudinal plan of care) patient will work with care management team to address care coordination and chronic disease management needs related to Disease Management Educational Needs Care Coordination Medication Management and Education Medication Reconciliation Psychosocial Support   Interventions:  11/08/20 completed successful outbound call with patient  Evaluation of current treatment plan related to HTN, sleep apnea, polypharmacy, prediabetes, Morbid Obesity , self-management and patient's adherence to plan as established by provider. Collaboration with [name], FNP regarding development and update of comprehensive plan of care as evidenced by provider attestation       and co-signature Inter-disciplinary care team collaboration (see longitudinal plan of care) Provided education to patient about basic disease process related to chronic kidney disease  Review of patient status, including review of consultant's reports, relevant laboratory and other test results, and medications completed. Reviewed medications with patient and discussed importance of medication adherence Educated on dietary recommendations; Educated on importance of increasing water to 64 oz daily; Educated on importance of keeping blood pressure and prediabetes under good control  Technical sales engineer related to Eating Right with Chronic Kidney disease  Discussed plans with patient for ongoing care management follow up and provided patient with direct contact information for care management team Self Care Activities:  Self administers medications as prescribed Attends all scheduled provider appointments Calls pharmacy for medication refills Calls provider office for new concerns or questions Patient Goals: - increase water to  64 oz per day - keep blood pressure and prediabetes under good control   Follow Up Plan: Telephone follow up appointment with care management team member  scheduled for: 03/17/21     Plan:Telephone follow up appointment with care management team member scheduled for:  03/17/21  Barb Merino, RN, BSN, CCM Care Management Coordinator [facility]  Direct Phone: [phone]

## 2020-11-16 NOTE — Patient Instructions (Signed)
Goals Addressed      Diabetes disease progression prevented or minimized   On track    Timeframe:  Long-Range Goal Priority:  Medium Start Date: 05/28/20                            Expected End Date:  05/28/21     Next Follow Up Date: 03/17/21    Self administer medications as prescribed Attend all scheduled provider appointments Call pharmacy for medication refills Call provider office for new concerns or questions Continue to monitor blood sugars at home as discussed  Continue to adhere to dietary and exercise recommendations  Review printed education materials and discuss at next RN CM call                    Follow My Treatment Plan-Chronic Kidney   On track    Timeframe:  Long-Range Goal Priority:  High Start Date:  11/08/20                          Expected End Date: 11/08/21                      Follow Up Date 03/17/21    Patient Goals: - increase water to 64 oz per day - keep blood pressure and prediabetes under good control    Why is this important?   Staying as healthy as you can is very important. This may mean making changes if you smoke, don't exercise or eat poorly.  A healthy lifestyle is an important goal for you.  Following the treatment plan and making changes may be hard.  Try some of these steps to help keep the disease from getting worse.     Notes:      Hypertension monitored   On track    Timeframe:  Long-Range Goal Priority:  High Start Date: 05/28/20                            Expected End Date: 05/28/21  Follow Up Date: 03/17/21  Self administers medications as prescribed Attends all scheduled provider appointments Calls provider office for new concerns, questions, or BP outside discussed parameters Checks BP and records as discussed Follows a low sodium diet/DASH diet

## 2020-11-23 ENCOUNTER — Other Ambulatory Visit: Payer: Medicare Other | Admitting: *Deleted

## 2020-11-23 ENCOUNTER — Other Ambulatory Visit: Payer: Self-pay

## 2020-11-23 DIAGNOSIS — E878 Other disorders of electrolyte and fluid balance, not elsewhere classified: Secondary | ICD-10-CM | POA: Diagnosis not present

## 2020-11-23 LAB — BASIC METABOLIC PANEL
BUN/Creatinine Ratio: 26 (ref 12–28)
BUN: 33 mg/dL — ABNORMAL HIGH (ref 8–27)
CO2: 17 mmol/L — ABNORMAL LOW (ref 20–29)
Calcium: 10.3 mg/dL (ref 8.7–10.3)
Chloride: 101 mmol/L (ref 96–106)
Creatinine, Ser: 1.25 mg/dL — ABNORMAL HIGH (ref 0.57–1.00)
Glucose: 102 mg/dL — ABNORMAL HIGH (ref 65–99)
Potassium: 4.6 mmol/L (ref 3.5–5.2)
Sodium: 135 mmol/L (ref 134–144)
eGFR: 47 mL/min/{1.73_m2} — ABNORMAL LOW (ref >59)

## 2020-11-23 MED ORDER — OZEMPIC (2 MG/DOSE) 8 MG/3ML ~~LOC~~ SOPN: 2.0000 mg | PEN_INJECTOR | SUBCUTANEOUS | 11 refills | Status: DC

## 2020-11-23 NOTE — Addendum Note (Signed)
Addended by: Deanta Mincey E on: 11/23/2020 08:04 AM   Modules accepted: Orders

## 2020-11-26 MED ORDER — OZEMPIC (2 MG/DOSE) 8 MG/3ML ~~LOC~~ SOPN: 2.0000 mg | PEN_INJECTOR | SUBCUTANEOUS | 11 refills | Status: DC

## 2020-11-26 NOTE — Addendum Note (Signed)
Addended by: Jennfer Gassen E on: 11/26/2020 12:15 PM   Modules accepted: Orders

## 2020-12-02 ENCOUNTER — Other Ambulatory Visit: Payer: Self-pay | Admitting: Interventional Cardiology

## 2020-12-03 MED ORDER — OZEMPIC (1 MG/DOSE) 4 MG/3ML ~~LOC~~ SOPN: 1.0000 mg | PEN_INJECTOR | SUBCUTANEOUS | 1 refills | Status: DC

## 2020-12-15 ENCOUNTER — Encounter (INDEPENDENT_AMBULATORY_CARE_PROVIDER_SITE_OTHER): Payer: Self-pay | Admitting: Bariatrics

## 2020-12-15 ENCOUNTER — Other Ambulatory Visit: Payer: Self-pay

## 2020-12-15 ENCOUNTER — Ambulatory Visit (INDEPENDENT_AMBULATORY_CARE_PROVIDER_SITE_OTHER): Payer: Medicare Other | Admitting: Bariatrics

## 2020-12-15 VITALS — BP 115/80 | HR 82 | Temp 98.0°F | Ht 63.0 in | Wt 237.0 lb

## 2020-12-15 DIAGNOSIS — I10 Essential (primary) hypertension: Secondary | ICD-10-CM

## 2020-12-15 DIAGNOSIS — Z6841 Body Mass Index (BMI) 40.0 and over, adult: Secondary | ICD-10-CM

## 2020-12-15 DIAGNOSIS — R7303 Prediabetes: Secondary | ICD-10-CM

## 2020-12-15 NOTE — Progress Notes (Signed)
Chief Complaint:   OBESITY Maggi is here to discuss her progress with her obesity treatment plan along with follow-up of her obesity related diagnoses. Cherine is on the Category 1 Plan and states she is following her eating plan approximately 100% of the time. Analeise states she is walking more.   Today's visit was #: 5 Starting weight: 273 lbs Starting date: 08/25/2020 Today's weight: 237 lbs Today's date: 12/15/2020 Total lbs lost to date: 36 lbs Total lbs lost since last in-office visit: 5 lbs  Interim History: Marishka is down an additional 5 lbs and doing well overall.  Subjective:   1. Essential hypertension Koby is currently taking Bystolic, Norvasc, Aldactone and Diovan. Her hypertension is controlled.  2. Pre-diabetes Amberlynn is currently taking Ozempic.  Assessment/Plan:   1. Essential hypertension Niyati will continue with medications. She is working on healthy weight loss and exercise to improve blood pressure control. We will watch for signs of hypotension as she continues her lifestyle modifications.  2. Pre-diabetes Velta will continue with medications. She will continue to work on weight loss, exercise, and decreasing simple carbohydrates to help decrease the risk of diabetes.   3. Obesity, current BMI 42.1 Audery is currently in the action stage of change. As such, her goal is to continue with weight loss efforts. She has agreed to the Category 1 Plan.   Majesta will continue meal planning and intentional eating.   Exercise goals:  Donnell has sciatic pain doing acupuncture.   Behavioral modification strategies: increasing lean protein intake, decreasing simple carbohydrates, increasing vegetables, increasing water intake, decreasing eating out, no skipping meals, meal planning and cooking strategies, keeping healthy foods in the home, and planning for success.  Anysa has agreed to follow-up with our clinic in 2 weeks. She was informed of the importance of frequent  follow-up visits to maximize her success with intensive lifestyle modifications for her multiple health conditions.   Objective:   Blood pressure 115/80, pulse 82, temperature 98 F (36.7 C), height 5\' 3"  (1.6 m), weight 237 lb (107.5 kg), SpO2 98 %. Body mass index is 41.98 kg/m. Ahnyla is using a 3 point cane.  General: Cooperative, alert, well developed, in no acute distress. HEENT: Conjunctivae and lids unremarkable. Cardiovascular: Regular rhythm.  Lungs: Normal work of breathing. Neurologic: No focal deficits.   Lab Results  Component Value Date   CREATININE 1.25 (H) 11/23/2020   BUN 33 (H) 11/23/2020   NA 135 11/23/2020   K 4.6 11/23/2020   CL 101 11/23/2020   CO2 17 (L) 11/23/2020   Lab Results  Component Value Date   ALT 14 10/09/2019   AST 18 10/09/2019   ALKPHOS 200 (H) 10/09/2019   BILITOT 0.4 10/09/2019   Lab Results  Component Value Date   HGBA1C 5.8 (H) 08/25/2020   HGBA1C 6.0 (H) 06/30/2020   HGBA1C 6.0 (H) 03/11/2020   HGBA1C 6.1 (H) 10/09/2019   HGBA1C 6.6 (H) 01/06/2019   Lab Results  Component Value Date   INSULIN 19.6 08/25/2020   Lab Results  Component Value Date   TSH 1.880 08/25/2020   Lab Results  Component Value Date   CHOL 123 08/25/2020   HDL 47 08/25/2020   LDLCALC 58 08/25/2020   TRIG 97 08/25/2020   CHOLHDL 2.6 03/11/2020   Lab Results  Component Value Date   VD25OH 105.0 (H) 08/25/2020   Lab Results  Component Value Date   WBC 7.2 01/27/2019   HGB 12.4 01/27/2019  HCT 38.4 01/27/2019   MCV 90 01/27/2019   PLT 275 01/27/2019   No results found for: IRON, TIBC, FERRITIN  Obesity Behavioral Intervention:   Approximately 15 minutes were spent on the discussion below.  ASK: We discussed the diagnosis of obesity with [name] today and Debarah agreed to give [name] permission to discuss obesity behavioral modification therapy today.  ASSESS: Karmah has the diagnosis of obesity and her BMI today is  42.1 Calayah is in the  action stage of change.   ADVISE: Mesha was educated on the multiple health risks of obesity as well as the benefit of weight loss to improve her health. She was advised of the need for long term treatment and the importance of lifestyle modifications to improve her current health and to decrease her risk of future health problems.  AGREE: Multiple dietary modification options and treatment options were discussed and Raizel agreed to follow the recommendations documented in the above note.  ARRANGE: Kili was educated on the importance of frequent visits to treat obesity as outlined per CMS and USPSTF guidelines and agreed to schedule her next follow up appointment today.  Attestation Statements:   Reviewed by clinician on day of visit: allergies, medications, problem list, medical history, surgical history, family history, social history, and previous encounter notes.  I, [name], RMA, am acting as Location manager for [employer], DO.   I have reviewed the above documentation for accuracy and completeness, and I agree with the above. [name], DO

## 2020-12-16 DIAGNOSIS — E785 Hyperlipidemia, unspecified: Secondary | ICD-10-CM | POA: Insufficient documentation

## 2020-12-16 DIAGNOSIS — G44309 Post-traumatic headache, unspecified, not intractable: Secondary | ICD-10-CM | POA: Insufficient documentation

## 2020-12-16 DIAGNOSIS — M545 Low back pain, unspecified: Secondary | ICD-10-CM | POA: Insufficient documentation

## 2020-12-16 DIAGNOSIS — G8929 Other chronic pain: Secondary | ICD-10-CM | POA: Insufficient documentation

## 2020-12-28 ENCOUNTER — Telehealth: Payer: Self-pay | Admitting: Interventional Cardiology

## 2020-12-28 MED ORDER — OZEMPIC (1 MG/DOSE) 4 MG/3ML ~~LOC~~ SOPN
1.0000 mg | PEN_INJECTOR | SUBCUTANEOUS | 1 refills | Status: DC
Start: 2020-12-28 — End: 2021-04-19

## 2020-12-28 NOTE — Telephone Encounter (Signed)
Pt c/o medication issue:  1. Name of Medication:  BYSTOLIC 20 MG TABS  2. How are you currently taking this medication (dosage and times per day)? As prescribed   3. Are you having a reaction (difficulty breathing--STAT)? No   4. What is your medication issue? Nastasha is calling stating when she went to go pick up this medication they advised it was going to cost over $200. She is requesting a callback to discuss this due to it never being this much before. Please advise.

## 2020-12-28 NOTE — Telephone Encounter (Signed)
*  STAT* If patient is at the pharmacy, call can be transferred to refill team.   1. Which medications need to be refilled? (please list name of each medication and dose if known) Semaglutide, 1 MG/DOSE, (OZEMPIC, 1 MG/DOSE,) 4 MG/3ML SOPN  2. Which pharmacy/location (including street and city if local pharmacy) is medication to be sent to? [facility], [place] - [address] AT [place]  3. Do they need a 30 day or 90 day supply? 90 day supply

## 2020-12-28 NOTE — Telephone Encounter (Signed)
Spoke with pt and she states she went to the pharmacy to pick up her Bystolic and it was going to cost over $200.  States she has been on this a long time and never had this issue.  Advised I will contact the pharmacy and see what's going on as she may be in the donut hole.  Told pt if prior auth or assistance needed, we will send message to our nurse that takes care of that and she will be in touch if needed.   Spoke with rep at the pharmacy and she states the medication is not on pt's formulary and needs a prior auth.  Message has been sent to Prior Auth nurse.  We will see if we can get this covered for pt.

## 2020-12-28 NOTE — Telephone Encounter (Signed)
**Note De-Identified  Obfuscation** I did the non formulary Name BrandBystolic PA through covermymeds and received a message that a PA is not required.  I called [facility] and was advised that the pt paid $28/90 day supply for her last refill of Bystolic and that this refill will cost her $215/90 day supply.  The pt may be in her donut hole and there is no pt asst program for Bystolic.  I will call the pt to discuss GoodRx

## 2020-12-29 NOTE — Telephone Encounter (Signed)
**Note De-Identified  Obfuscation** I provided the link: [email or web address].[email or web address]/ To the pt and she states that she is going to that web site as soon as we ended our call.  She is aware to use the information she receives from the [place] savings program at [facility] when she picks up her Bystolic refill up.  She verbalized understanding and is aware to call Jeani Hawking at Dr Thompson Caul office at [facility] at [phone] if she has any questions, concerns, or issue with picking up her Bystolic refill.  She thanked me for calling her with this information.

## 2020-12-29 NOTE — Telephone Encounter (Signed)
Would confirm that PA request was sent to her [employer] FEP plan and not the [facility] plan. As long as pharmacy is billing her [place] FEP plan and that no PA is needed with that plan, she can use the commercial copay card with the link Melissa sent.

## 2020-12-30 ENCOUNTER — Encounter: Payer: Self-pay | Admitting: Adult Health

## 2020-12-30 ENCOUNTER — Ambulatory Visit (INDEPENDENT_AMBULATORY_CARE_PROVIDER_SITE_OTHER): Payer: Medicare Other | Admitting: Adult Health

## 2020-12-30 VITALS — BP 122/84 | HR 88 | Ht 64.0 in | Wt 238.1 lb

## 2020-12-30 DIAGNOSIS — R269 Unspecified abnormalities of gait and mobility: Secondary | ICD-10-CM | POA: Diagnosis not present

## 2020-12-30 DIAGNOSIS — F0781 Postconcussional syndrome: Secondary | ICD-10-CM | POA: Diagnosis not present

## 2020-12-30 DIAGNOSIS — R519 Headache, unspecified: Secondary | ICD-10-CM | POA: Diagnosis not present

## 2020-12-30 DIAGNOSIS — G3184 Mild cognitive impairment, so stated: Secondary | ICD-10-CM | POA: Diagnosis not present

## 2020-12-30 NOTE — Progress Notes (Signed)
[facility] [address] [place]. [place] [address] [phone]       OFFICE FOLLOW UP VISITNOTE  [name] Date of Birth:  1955/05/26 Medical Record Number:  [id number]   Referring MD: Sherley Bounds Reason for Referral: Headaches   Chief Complaint  Patient presents with   Follow-up    Rm 2 alone here for 6 month f/u- Pt reports she has been doing ok since last visit. Headaches have greatly improved, balance has been off. One fall since last visit but no injury.       HPI:   Today, 12/30/2020, Brandi [name] returns for 70-month follow-up accompanied by her daughter. Patient reports she has been doing well overall without any recent headaches and continued use of topiramate tolerating without side effects.  She does report having a fall approximately 1 month ago thankfully without injury.  She has been having more issues with sciatica and neuropathy and is planned to establish care with chronic conditions of [place].  Daughter is concerned regarding memory loss issues and worsening gait. She moved in with her daughter back in July.  She does endorse increased stressors over the past year. Previously discussed memory loss concerns with evaluation for reversible causes unremarkable.  No further concerns at this time     History provided for reference purposes only Update 06/23/2020 JM: Brandi [name] returns for 53-month follow-up regarding chronic daily headaches and history of postconcussive syndrome  Since prior visit, headaches have greatly improved - she is not able to say when her last headache occurred  Currently on topiramate 50 mg twice daily - tolerating without side effects  Does report right hand numbness in the distribution of the median nerve that is typically worse upon awakening in the morning and worsens with certain activities. Reports prior dx of carpal tunnel " many years ago" and used to wear brace.  Denies any weakness or difficulty with  right hand function.  No further concerns at this time  Update 02/25/2020 Brandi [name]: She returns for follow-up after last visit more than a year ago.  She states memory and cognitive difficulties have improved.  She however still has almost daily headaches.  She describes headaches been fluctuating in severity from mild to severe but does not last long.  She takes 1 tablet of Tylenol which seems to work somewhat but the headache returns.  She denies any nausea vomiting light or sound sensitivity.  She is ableshe thinks has helped but she has not tried a higher dose.  She also takes gabapentin for sciatica but takes it only once a day as well as Requip 1 mg twice daily for restless legs which have not been bothering her recently.  She has no new complaints.  Update 12/04/2018 Brandi [name]: She returns for follow-up after last visit 2 months ago.  She is accompanied by her daughter.  Patient states she is doing better.  She had only one fall recently when she felt dizzy and off-balance.  She uses a cane most of the time now and is careful when she gets up and turns.  She had MRI scan of the brain done on 10/31/2018 which was fairly unremarkable except for incidental empty sella.  EEG done on 10/09/2018 was normal.  Vitamin B12, TSH and RPR were normal on 09/26/2018.  Patient states Topamax that has started is helping hand pain and she is able to grip objects better she is not dropping them as much.  She feels her memory  difficulties are unchanged.  She has however not been participating in activities for regular stress laxation.  She complains of dizziness but has been taking both Flexeril and gabapentin 3 times daily and she is willing to reduce them as she feels she does not need them as much now.  Initial visit 09/26/2018 Dr. Leonie [name] Brandi [name] is a 65 year old pleasant Caucasian lady who is seen today for initial consultation visit.  She is accompanied by her daughter.  History is obtained from her and review of  referral notes.  I have reviewed imaging films in PACS.  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  She states she fell without warm warning face forward and sustained bruises on her forehead and face.  She states she did not lose consciousness but daughter of states that she may have briefly lost it.  Patient was seen in the ER in the hospital where apparently brain scan was unremarkable however I do not have those hospital records for my review today.  Since then she has had constant headaches, tremors of her hands, short-term memory difficulties.  She states her balance is poor.  She has been using a walker at times and she can use a cane also and she wants to.  She is also been dropping objects from her hand intermittently.  She also complains of some occasional word finding difficulties and states her speech is slow.  This symptoms have continued and have not gotten better.  She had a CT scan of the head ordered by Dr. Ronnald Ramp on 09/18/2018 which I personally reviewed shows no acute abnormality.  Patient states she was seeing a neurologist in [place] but I do not have those records either.  She does not remember having an MRI scan done.  She has also chronic back pain and sciatica and she takes Flexeril 10 mg 3 times daily, gabapentin 300 mg 3 times daily and tramadol as needed.  She also takes Requip 1 mg at night for restless legs.  Patient denies any significant increasing headaches, focal extremity weakness, stroke, TIA, seizures. to work despite the headache and sleeps well at night.  She has been taking Topamax 25 mg once a day which      ROS:   14 system review of systems is positive for those listed in HPI and all other systems negative  PMH:  Past Medical History:  Diagnosis Date   Anemia    Arthritis    Back pain    Edema, lower extremity    Food allergy    GERD (gastroesophageal reflux disease)    Hypertension    Hypertensive  crisis    Joint pain    Morbid obesity ([place])    Osteoporosis    Pre-diabetes    Rheumatoid arthritis ([place])    Sciatica neuralgia    Sleep apnea     Social History:  Social History   Socioeconomic History   Marital status: Single    Spouse name: Not on file   Number of children: Not on file   Years of education: Not on file   Highest education level: Not on file  Occupational History   Occupation: Retired Medical illustrator Nurseb  Tobacco Use   Smoking status: Never   Smokeless tobacco: Never  Vaping Use   Vaping Use: Never used  Substance and Sexual Activity   Alcohol use: Not Currently   Drug use: Not Currently   Sexual activity:  Not on file  Other Topics Concern   Not on file  Social History Narrative   Lives alone   Right handed   Drinks no caffeine   Social Determinants of Health   Financial Resource Strain: Low Risk    Difficulty of Paying Living Expenses: Not hard at all  Food Insecurity: No Food Insecurity   Worried About Charity fundraiser in the Last Year: Never true   Ran Out of Food in the Last Year: Never true  Transportation Needs: No Transportation Needs   Lack of Transportation (Medical): No   Lack of Transportation (Non-Medical): No  Physical Activity: Inactive   Days of Exercise per Week: 0 days   Minutes of Exercise per Session: 0 min  Stress: No Stress Concern Present   Feeling of Stress : Not at all  Social Connections: Not on file  Intimate Partner Violence: Not on file    Medications:   Current Outpatient Medications on File Prior to Visit  Medication Sig Dispense Refill   amLODipine (NORVASC) 5 MG tablet Take 1.5 tablets (7.5 mg total) by mouth daily. 135 tablet 3   atorvastatin (LIPITOR) 40 MG tablet TAKE 1 TABLET BY MOUTH EVERY DAY 90 tablet 3   Biotin 1000 MCG tablet Take 1,000 mcg by mouth daily.     BYSTOLIC 20 MG TABS Take 1 tablet by mouth once daily 90 tablet 3   cetirizine (ZYRTEC) 10 MG tablet Take 10 mg by mouth daily.      cyclobenzaprine (FLEXERIL) 10 MG tablet TAKE 1 TABLET(10 MG) BY MOUTH THREE TIMES DAILY 270 tablet 1   fluticasone (FLONASE) 50 MCG/ACT nasal spray Flonase Allergy Relief 50 mcg/actuation nasal spray,suspension  INSTILL 2 SPRAYS IN EACH NOSTRIL QD     Magnesium 500 MG TABS Take 1 tablet by mouth daily.     omeprazole (PRILOSEC) 20 MG capsule Take 20 mg by mouth daily.     PRESCRIPTION MEDICATION STRENGTIA 30MG  BID     PRESCRIPTION MEDICATION HEPATO-CI BID     PRESCRIPTION MEDICATION OMEGA CO3-SE BID     PRESCRIPTION MEDICATION b12 GD ONE DROPPER     Semaglutide, 1 MG/DOSE, (OZEMPIC, 1 MG/DOSE,) 4 MG/3ML SOPN Inject 1 mg into the skin once a week. 9 mL 1   spironolactone (ALDACTONE) 50 MG tablet Take 1 tablet (50 mg total) by mouth daily. 90 tablet 3   topiramate (TOPAMAX) 50 MG tablet Take 1 tablet (50 mg total) by mouth 2 (two) times daily. 180 tablet 3   traMADol (ULTRAM) 50 MG tablet Take 1 tablet (50 mg total) by mouth every 6 (six) hours as needed. 20 tablet 0   valsartan (DIOVAN) 160 MG tablet Take 1 tablet (160 mg total) by mouth daily. 90 tablet 3   Vitamin E 180 MG CAPS Take 1 capsule by mouth daily.     No current facility-administered medications on file prior to visit.    Allergies:   Allergies  Allergen Reactions   Hydralazine Itching   Meperidine Hcl Other (See Comments)   Aliskiren Hives   Aspirin Nausea Only   Chocolate    Juniper Berries    Latex     hives   Oxycodone-Acetaminophen Itching   Rofecoxib Diarrhea   Spironolactone Other (See Comments)   Furosemide Other (See Comments)    Rash and extreme dehydration   Sulfa Antibiotics Rash    Physical Exam Today's Vitals   12/30/20 1446  BP: 122/84  Pulse: 88  SpO2: 97%  Weight:  238 lb 2 oz (108 kg)  Height: 5\' 4"  (1.626 m)    Body mass index is 40.87 kg/m.  General: Morbidly obese very pleasant middle-aged Caucasian lady, seated, in no evident distress Head: head normocephalic and atraumatic.    Neck: supple with no carotid or supraclavicular bruits Cardiovascular: regular rate and rhythm, no murmurs Musculoskeletal: no deformity Skin:  no rash/petichiae Vascular:  Normal pulses all extremities  Neurologic Exam Mental Status: Awake and fully alert. Oriented to place and time. Recent memory subjectively impaired and remote memory intact. Attention span, concentration and fund of knowledge appropriate. Mood and affect appropriate.   Cranial Nerves:  pupils equal, briskly reactive to light. Extraocular movements full without nystagmus. Visual fields full to confrontation. Hearing intact. Facial sensation intact. Face, tongue, palate moves normally and symmetrically.  Motor: Normal bulk and tone. Normal strength in all tested extremity muscles. Sensory.: intact to touch , pinprick , position and vibratory sensation.  Coordination: Rapid alternating movements normal in all extremities. Finger-to-nose and heel-to-shin performed accurately bilaterally. Gait and Station: Arises from chair without difficulty.  Uses a cane.  Stance is normal. Gait demonstrates broad-based gait with normal stride length and balance and use of cane. heel, toe and tandem walk not attempted Reflexes: 1+ and symmetric except ankle jerks are depressed bilaterally. Toes downgoing.       ASSESSMENT/PLAN: 64 year old Caucasian lady with 2-year history of postconcussive syndrome with chronic headache, mild cognitive impairment and gait impairment    Chronic daily headache -Remains stable -Continue topiramate 50 mg twice daily -refill up to date  Mild cognitive impairment -chronic issue -concern of short term memory loss likely in setting of increased stressors and worsening sciatica pain -Discussed importance of routine memory exercises such as crossword puzzles, word search, etc as well as ensuring routine exercise and activity, healthy diet and adequate sleep  Gait impairment -Slight decline likely in  setting of worsening sciatica pain -discussed working with PT but she plans on establishing care with chronic conditions of [place] and plans on discussing further at that time -Unable to appreciate you deficit or concerning findings on exam    Follow-up in 6 months or call earlier if needed   CC:  [place] provider: Dr. Baron Sane, Doreene Burke, FNP    I spent 34 minutes of face-to-face and non-face-to-face time with patient and daughter.  This included previsit chart review, lab review, study review, electronic health record documentation, patient and daughter education and discussion regarding history of chronic daily headaches and use of topiramate, mild cognitive impairment and gait impairment and likely etiologies and answered all other questions to patient and daughters satisfaction  Frann Rider, AGNP-BC  [facility] [address] [place], [place] [address]  Phone [phone] Fax [phone] Note: This document was prepared with digital dictation and possible smart phrase technology. Any transcriptional errors that result from this process are unintentional.

## 2020-12-31 MED ORDER — NEBIVOLOL HCL 20 MG PO TABS: ORAL_TABLET | ORAL | 1 refills | Status: DC

## 2020-12-31 NOTE — Telephone Encounter (Signed)
**Note De-Identified  Obfuscation** The pt states that she does not qualify for the Bystolic savings program because she has [employer].  She states that she has reached out to [employer]. Patient Assistance Program and that they are e-mailing her an application.  She is concerned that she will not qualify for this asst programs and only has enough Bystolic on hand to last her until Sunday. She is aware we have no samples  I asked her if she has ever taken generic Nebivolol and she stated that she was unaware of a generic form of Bystolic and is willing to try it.  Per her request I have e-scribed a Nebivolol 20 mg #90 with 1 refill to [facility] to fill.  She has an appointment with Dr Tamala Julian on Monday 10/10 and will discuss this change with him at that time.

## 2020-12-31 NOTE — Telephone Encounter (Signed)
We should switch to Nebivolol if she tolerates it.

## 2020-12-31 NOTE — Addendum Note (Signed)
**Note De-Identified Davell Beckstead Obfuscation** Addended by: Dennie Fetters on: 12/31/2020 10:42 AM   Modules accepted: Orders

## 2021-01-02 NOTE — Progress Notes (Signed)
Cardiology Office Note:    Date:  01/03/2021   ID:  MCKENLEY BIRENBAUM, DOB 1956/03/08, MRN [id number]  PCP:  [name], FNP  Cardiologist:  Sinclair Grooms, MD   Referring MD: [name], FNP   Chief Complaint  Patient presents with   Hypertension    History of Present Illness:    Brandi [name] is a 65 y.o. female with a hx of severe hypertension, hypertensive heart disease, congestive heart failure (EF 55-60% in 2020), multiple medication intolerances, significant bilateral lower extremity edema, and morbid obesity, diabetes with A1c up to 6.6 back in 2020, now down to 5.8 with Ozempic, and obstructive sleep apnea but not using CPAP.  BP has been controlled via [facility]!!  She is accompanied by her daughter.  She is taking current medications and having no side effects.  She is concerned that some of the diastolic blood pressures are 90.  Systolic pressures have been clearly under 140 mmHg.  She is losing weight.  She is decreasing salt in her diet.  She will not wear CPAP.  Past Medical History:  Diagnosis Date   Anemia    Arthritis    Back pain    Edema, lower extremity    Food allergy    GERD (gastroesophageal reflux disease)    Hypertension    Hypertensive crisis    Joint pain    Morbid obesity ([place])    Osteoporosis    Pre-diabetes    Rheumatoid arthritis ([place])    Sciatica neuralgia    Sleep apnea     Past Surgical History:  Procedure Laterality Date   ABDOMINAL HYSTERECTOMY     PARTIAL   CHOLECYSTECTOMY     PROTESTIC HIPS     PROTESTIC KNEE      Current Medications: Current Meds  Medication Sig   amLODipine (NORVASC) 5 MG tablet Take 1.5 tablets (7.5 mg total) by mouth daily.   atorvastatin (LIPITOR) 40 MG tablet TAKE 1 TABLET BY MOUTH EVERY DAY   Biotin 1000 MCG tablet Take 1,000 mcg by mouth daily.   cetirizine (ZYRTEC) 10 MG tablet Take 10 mg by mouth daily.   cyclobenzaprine (FLEXERIL) 10 MG tablet TAKE 1 TABLET(10 MG) BY MOUTH  THREE TIMES DAILY   fluticasone (FLONASE) 50 MCG/ACT nasal spray Flonase Allergy Relief 50 mcg/actuation nasal spray,suspension  INSTILL 2 SPRAYS IN EACH NOSTRIL QD   Magnesium 500 MG TABS Take 1 tablet by mouth daily.   Nebivolol HCl (BYSTOLIC) 20 MG TABS Take 1 tablet by mouth once daily   omeprazole (PRILOSEC) 20 MG capsule Take 20 mg by mouth daily.   PRESCRIPTION MEDICATION STRENGTIA 30MG  BID   PRESCRIPTION MEDICATION HEPATO-CI BID   PRESCRIPTION MEDICATION OMEGA CO3-SE BID   PRESCRIPTION MEDICATION b12 GD ONE DROPPER   rOPINIRole (REQUIP) 2 MG tablet Take 1 tablet by mouth 2 (two) times daily.   Semaglutide, 1 MG/DOSE, (OZEMPIC, 1 MG/DOSE,) 4 MG/3ML SOPN Inject 1 mg into the skin once a week.   spironolactone (ALDACTONE) 50 MG tablet Take 1 tablet (50 mg total) by mouth daily.   topiramate (TOPAMAX) 50 MG tablet Take 1 tablet (50 mg total) by mouth 2 (two) times daily.   traMADol (ULTRAM) 50 MG tablet Take 1 tablet (50 mg total) by mouth every 6 (six) hours as needed.   valsartan (DIOVAN) 160 MG tablet Take 1 tablet (160 mg total) by mouth daily.   Vitamin E 180 MG CAPS Take 1 capsule by mouth daily.  Allergies:   Hydralazine, Meperidine hcl, Aliskiren, Aspirin, Chocolate, Juniper berries, Latex, Oxycodone-acetaminophen, Rofecoxib, Spironolactone, Furosemide, and Sulfa antibiotics   Social History   Socioeconomic History   Marital status: Single    Spouse name: Not on file   Number of children: Not on file   Years of education: Not on file   Highest education level: Not on file  Occupational History   Occupation: Retired Medical illustrator Nurseb  Tobacco Use   Smoking status: Never   Smokeless tobacco: Never  Vaping Use   Vaping Use: Never used  Substance and Sexual Activity   Alcohol use: Not Currently   Drug use: Not Currently   Sexual activity: Not on file  Other Topics Concern   Not on file  Social History Narrative   Lives alone   Right handed   Drinks no  caffeine   Social Determinants of Health   Financial Resource Strain: Low Risk    Difficulty of Paying Living Expenses: Not hard at all  Food Insecurity: No Food Insecurity   Worried About Charity fundraiser in the Last Year: Never true   Arboriculturist in the Last Year: Never true  Transportation Needs: No Transportation Needs   Lack of Transportation (Medical): No   Lack of Transportation (Non-Medical): No  Physical Activity: Inactive   Days of Exercise per Week: 0 days   Minutes of Exercise per Session: 0 min  Stress: No Stress Concern Present   Feeling of Stress : Not at all  Social Connections: Not on file     Family History: The patient's family history includes Anxiety disorder in her father; Bipolar disorder in her father; Cancer in her mother; Diabetes in her father; Eating disorder in her mother; Heart disease in her father and mother; High Cholesterol in her father; High blood pressure in her father; Hypertension in her mother and sister; Kidney disease in her father; Obesity in her mother; Schizophrenia in her father.  ROS:   Please see the history of present illness.    She denies medication side effects.  All other systems reviewed and are negative.  EKGs/Labs/Other Studies Reviewed:    The following studies were reviewed today: No new imaging  EKG:  EKG not performed  Recent Labs: 08/25/2020: TSH 1.880 11/23/2020: BUN 33; Creatinine, Ser 1.25; Potassium 4.6; Sodium 135  Recent Lipid Panel    Component Value Date/Time   CHOL 123 08/25/2020 1143   TRIG 97 08/25/2020 1143   HDL 47 08/25/2020 1143   CHOLHDL 2.6 03/11/2020 1505   LDLCALC 58 08/25/2020 1143    Physical Exam:    VS:  BP 120/90   Pulse 97   Ht 5\' 4"  (1.626 m)   Wt 237 lb 12.8 oz (107.9 kg)   SpO2 99%   BMI 40.82 kg/m     Wt Readings from Last 3 Encounters:  01/03/21 237 lb 12.8 oz (107.9 kg)  12/30/20 238 lb 2 oz (108 kg)  12/15/20 237 lb (107.5 kg)     GEN: Morbid obesity with BMI  41. No acute distress HEENT: Normal NECK: No JVD. LYMPHATICS: No lymphadenopathy CARDIAC: No murmur. RRR no gallop, or edema. VASCULAR:  Normal Pulses. No bruits. RESPIRATORY:  Clear to auscultation without rales, wheezing or rhonchi  ABDOMEN: Soft, non-tender, non-distended, No pulsatile mass, MUSCULOSKELETAL: No deformity  SKIN: Warm and dry NEUROLOGIC:  Alert and oriented x 3 PSYCHIATRIC:  Normal affect   ASSESSMENT:    1. Hypertensive heart disease with heart  failure ([place])   2. Chronic diastolic HF (heart failure) ([place])   3. Essential hypertension   4. Morbid obesity ([place])   5. Prediabetes   6. Obstructive sleep apnea    PLAN:    In order of problems listed above:  Doing okay.  Continue nebivolol 20 mg/day, amlodipine 7-1/2 mg/day, spironolactone 50 mg/day, and valsartan 160 mg/day.  She will have a basic metabolic panel performed 3 months from her last laboratory work and August 2022. No evidence of volume overload as noted. Blood pressure is under good control at 120/90 mmHg.  As her weight comes down, intensity of antihypertensive regimen may also decrease. Losing weight on semaglutide/Ozempic once a week. Low carbohydrate diet I encouraged her to wear CPAP.    6 to 9 months to see me.  Basic metabolic panel will be done in 1 month.  On her current medication regimen, basic metabolic panel needs to be done 3-4 times per year.   Medication Adjustments/Labs and Tests Ordered: Current medicines are reviewed at length with the patient today.  Concerns regarding medicines are outlined above.  No orders of the defined types were placed in this encounter.  No orders of the defined types were placed in this encounter.   Patient Instructions  Medication Instructions:  Your physician recommends that you continue on your current medications as directed. Please refer to the Current Medication list given to you today.  *If you need a refill on your cardiac medications  before your next appointment, please call your pharmacy*   Lab Work: None If you have labs (blood work) drawn today and your tests are completely normal, you will receive your results only by: [place] (if you have MyChart) OR A paper copy in the mail If you have any lab test that is abnormal or we need to change your treatment, we will call you to review the results.   Testing/Procedures: None   Follow-Up: At [facility], you and your health needs are our priority.  As part of our continuing mission to provide you with exceptional heart care, we have created designated Provider Care Teams.  These Care Teams include your primary Cardiologist (physician) and Advanced Practice Providers (APPs -  Physician Assistants and Nurse Practitioners) who all work together to provide you with the care you need, when you need it.  We recommend signing up for the patient portal called "MyChart".  Sign up information is provided on this After Visit Summary.  MyChart is used to connect with patients for Virtual Visits (Telemedicine).  Patients are able to view lab/test results, encounter notes, upcoming appointments, etc.  Non-urgent messages can be sent to your provider as well.   To learn more about what you can do with MyChart, go to [email or web address].    Your next appointment:   9 month(s)  The format for your next appointment:   In Person  Provider:   You may see Sinclair Grooms, MD or one of the following Advanced Practice Providers on your designated Care Team:   Cecilie Kicks, NP   Other Instructions     Signed, Sinclair Grooms, MD  01/03/2021 2:26 PM    [place]

## 2021-01-03 ENCOUNTER — Ambulatory Visit (INDEPENDENT_AMBULATORY_CARE_PROVIDER_SITE_OTHER): Payer: Medicare Other | Admitting: Interventional Cardiology

## 2021-01-03 ENCOUNTER — Other Ambulatory Visit: Payer: Self-pay

## 2021-01-03 ENCOUNTER — Encounter: Payer: Self-pay | Admitting: Interventional Cardiology

## 2021-01-03 VITALS — BP 120/90 | HR 97 | Ht 64.0 in | Wt 237.8 lb

## 2021-01-03 DIAGNOSIS — G4733 Obstructive sleep apnea (adult) (pediatric): Secondary | ICD-10-CM

## 2021-01-03 DIAGNOSIS — R7303 Prediabetes: Secondary | ICD-10-CM

## 2021-01-03 DIAGNOSIS — I5032 Chronic diastolic (congestive) heart failure: Secondary | ICD-10-CM

## 2021-01-03 DIAGNOSIS — I10 Essential (primary) hypertension: Secondary | ICD-10-CM | POA: Diagnosis not present

## 2021-01-03 DIAGNOSIS — I110 Hypertensive heart disease with heart failure: Secondary | ICD-10-CM

## 2021-01-03 NOTE — Patient Instructions (Signed)
Medication Instructions:  Your physician recommends that you continue on your current medications as directed. Please refer to the Current Medication list given to you today.  *If you need a refill on your cardiac medications before your next appointment, please call your pharmacy*   Lab Work: None If you have labs (blood work) drawn today and your tests are completely normal, you will receive your results only by: [place] (if you have MyChart) OR A paper copy in the mail If you have any lab test that is abnormal or we need to change your treatment, we will call you to review the results.   Testing/Procedures: None   Follow-Up: At [facility], you and your health needs are our priority.  As part of our continuing mission to provide you with exceptional heart care, we have created designated Provider Care Teams.  These Care Teams include your primary Cardiologist (physician) and Advanced Practice Providers (APPs -  Physician Assistants and Nurse Practitioners) who all work together to provide you with the care you need, when you need it.  We recommend signing up for the patient portal called "MyChart".  Sign up information is provided on this After Visit Summary.  MyChart is used to connect with patients for Virtual Visits (Telemedicine).  Patients are able to view lab/test results, encounter notes, upcoming appointments, etc.  Non-urgent messages can be sent to your provider as well.   To learn more about what you can do with MyChart, go to [email or web address].    Your next appointment:   9 month(s)  The format for your next appointment:   In Person  Provider:   You may see Sinclair Grooms, MD or one of the following Advanced Practice Providers on your designated Care Team:   Cecilie Kicks, NP   Other Instructions

## 2021-01-03 NOTE — Addendum Note (Signed)
Addended by: Loren Racer on: 01/03/2021 02:33 PM   Modules accepted: Orders

## 2021-01-04 ENCOUNTER — Encounter (INDEPENDENT_AMBULATORY_CARE_PROVIDER_SITE_OTHER): Payer: Self-pay | Admitting: Bariatrics

## 2021-01-04 ENCOUNTER — Ambulatory Visit (INDEPENDENT_AMBULATORY_CARE_PROVIDER_SITE_OTHER): Payer: Medicare Other | Admitting: Bariatrics

## 2021-01-04 VITALS — BP 113/77 | HR 97 | Temp 97.7°F | Ht 64.0 in | Wt 234.0 lb

## 2021-01-04 DIAGNOSIS — K5909 Other constipation: Secondary | ICD-10-CM | POA: Diagnosis not present

## 2021-01-04 DIAGNOSIS — I10 Essential (primary) hypertension: Secondary | ICD-10-CM | POA: Diagnosis not present

## 2021-01-04 DIAGNOSIS — R7303 Prediabetes: Secondary | ICD-10-CM | POA: Diagnosis not present

## 2021-01-04 DIAGNOSIS — Z6841 Body Mass Index (BMI) 40.0 and over, adult: Secondary | ICD-10-CM

## 2021-01-05 ENCOUNTER — Ambulatory Visit (INDEPENDENT_AMBULATORY_CARE_PROVIDER_SITE_OTHER): Payer: Medicare Other | Admitting: Bariatrics

## 2021-01-05 NOTE — Progress Notes (Signed)
Chief Complaint:   OBESITY Brandi [name] is here to discuss her progress with her obesity treatment plan along with follow-up of her obesity related diagnoses. Brandi [name] is on the Category 1 Plan and states she is following her eating plan approximately 80% of the time. Brandi [name] states she is walking stairs for 15 minutes 7 times per week.  Today's visit was #: 6 Starting weight: 273 lbs Starting date: 08/25/2020 Today's weight: 234 lbs Today's date: 01/04/2021 Total lbs lost to date: 39 lbs Total lbs lost since last in-office visit: 3 lbs  Interim History: Brandi [name] is down 3 lbs since her last visit.   Subjective:   1. Essential hypertension Brandi [name]'s blood pressure is controlled.   2. Pre-diabetes Brandi [name] is taking Ozempic currently. She denies side effects.   3. Other constipation Brandi [name] is getting adequate water. Her last bowel movement was about 3 days ago.   Assessment/Plan:   1. Essential hypertension Brandi [name] will continue her medications. She is working on healthy weight loss and exercise to improve blood pressure control. We will watch for [name] of hypotension as she continues her lifestyle modifications.  2. Pre-diabetes Brandi [name] will continue her medications. She will continue to work on weight loss, exercise, and decreasing simple carbohydrates to help decrease the risk of diabetes.   3. Other constipation Brandi [name] will start Ducolax or Milk of Magnesia. She will use Miralax in coffee, tea or water. She was informed that a decrease in bowel movement frequency is normal while losing weight, but stools should not be hard or painful. Orders and follow up as documented in patient record.   Counseling Getting to Good Bowel Health: Your goal is to have one soft bowel movement each day. Drink at least 8 glasses of water each day. Eat plenty of fiber (goal is over 25 grams each day). It is best to get most of your fiber from dietary sources which includes leafy green vegetables, fresh fruit, and  whole grains. You may need to add fiber with the help of OTC fiber supplements. These include Metamucil, Citrucel, and Flaxseed. If you are still having trouble, try adding Miralax or Magnesium Citrate. If all of these changes do not work, Cabin crew.   4. Obesity, current BMI 41.5 Brandi [name] is currently in the action stage of change. As such, her goal is to continue with weight loss efforts. She has agreed to the Category 1 Plan.   Brandi [name] will continue meal planning and intentional eating.  Exercise goals:  Brandi [name] will continue walking the stairs and she will do some activities.   Behavioral modification strategies: increasing lean protein intake, decreasing simple carbohydrates, increasing vegetables, increasing water intake, decreasing eating out, no skipping meals, meal planning and cooking strategies, keeping healthy foods in the home, and planning for success.  Brandi [name] has agreed to follow-up with our clinic in 2-3 weeks. She was informed of the importance of frequent follow-up visits to maximize her success with intensive lifestyle modifications for her multiple health conditions.   Objective:   Blood pressure 113/77, pulse 97, temperature 97.7 F (36.5 C), height 5\' 4"  (1.626 m), weight 234 lb (106.1 kg), SpO2 99 %. Body mass index is 40.17 kg/m.  General: Cooperative, alert, well developed, in no acute distress. HEENT: Conjunctivae and lids unremarkable. Cardiovascular: Regular rhythm.  Lungs: Normal work of breathing. Neurologic: No focal deficits.   Lab Results  Component Value Date   CREATININE 1.25 (H) 11/23/2020   BUN 33 (H) 11/23/2020   NA 135  11/23/2020   K 4.6 11/23/2020   CL 101 11/23/2020   CO2 17 (L) 11/23/2020   Lab Results  Component Value Date   ALT 14 10/09/2019   AST 18 10/09/2019   ALKPHOS 200 (H) 10/09/2019   BILITOT 0.4 10/09/2019   Lab Results  Component Value Date   HGBA1C 5.8 (H) 08/25/2020   HGBA1C 6.0 (H) 06/30/2020   HGBA1C 6.0 (H)  03/11/2020   HGBA1C 6.1 (H) 10/09/2019   HGBA1C 6.6 (H) 01/06/2019   Lab Results  Component Value Date   INSULIN 19.6 08/25/2020   Lab Results  Component Value Date   TSH 1.880 08/25/2020   Lab Results  Component Value Date   CHOL 123 08/25/2020   HDL 47 08/25/2020   LDLCALC 58 08/25/2020   TRIG 97 08/25/2020   CHOLHDL 2.6 03/11/2020   Lab Results  Component Value Date   VD25OH 105.0 (H) 08/25/2020   Lab Results  Component Value Date   WBC 7.2 01/27/2019   HGB 12.4 01/27/2019   HCT 38.4 01/27/2019   MCV 90 01/27/2019   PLT 275 01/27/2019   No results found for: IRON, TIBC, FERRITIN  Obesity Behavioral Intervention:   Approximately 15 minutes were spent on the discussion below.  ASK: We discussed the diagnosis of obesity with Brandi [name] today and Brandi [name] agreed to give Brandi [name] permission to discuss obesity behavioral modification therapy today.  ASSESS: Brandi [name] has the diagnosis of obesity and her BMI today is 41.5. Brandi [name] is in the action stage of change.   ADVISE: Brandi [name] was educated on the multiple health risks of obesity as well as the benefit of weight loss to improve her health. She was advised of the need for long term treatment and the importance of lifestyle modifications to improve her current health and to decrease her risk of future health problems.  AGREE: Multiple dietary modification options and treatment options were discussed and Brandi [name] agreed to follow the recommendations documented in the above note.  ARRANGE: Brandi [name] was educated on the importance of frequent visits to treat obesity as outlined per CMS and USPSTF guidelines and agreed to schedule her next follow up appointment today.  Attestation Statements:   Reviewed by clinician on day of visit: allergies, medications, problem list, medical history, surgical history, family history, social history, and previous encounter notes.  I, Brandi [name], RMA, am acting as Location manager for [employer], DO.   I  have reviewed the above documentation for accuracy and completeness, and I agree with the above. Brandi Lesch, DO

## 2021-01-06 ENCOUNTER — Telehealth: Payer: Self-pay | Admitting: Interventional Cardiology

## 2021-01-06 ENCOUNTER — Encounter (INDEPENDENT_AMBULATORY_CARE_PROVIDER_SITE_OTHER): Payer: Self-pay | Admitting: Bariatrics

## 2021-01-06 NOTE — Telephone Encounter (Signed)
Pt seen by Dr. Tamala Julian on 10/10.  After leaving Dr. Tamala Julian decided we needed to get a BMET on her in November.  Called pt and left message to call back and schedule a lab appt anytime in November. Order in for lab.

## 2021-01-13 NOTE — Progress Notes (Signed)
I agree with the above plan 

## 2021-01-14 NOTE — Telephone Encounter (Signed)
Spoke with pt and scheduled her to come for labs on 11/10.  Pt agreeable to plan.

## 2021-01-20 ENCOUNTER — Encounter (INDEPENDENT_AMBULATORY_CARE_PROVIDER_SITE_OTHER): Payer: Self-pay | Admitting: Bariatrics

## 2021-01-20 ENCOUNTER — Ambulatory Visit (INDEPENDENT_AMBULATORY_CARE_PROVIDER_SITE_OTHER): Payer: Medicare Other | Admitting: Bariatrics

## 2021-01-20 ENCOUNTER — Other Ambulatory Visit: Payer: Self-pay

## 2021-01-20 VITALS — BP 114/81 | HR 90 | Temp 97.8°F | Ht 64.0 in | Wt 229.0 lb

## 2021-01-20 DIAGNOSIS — R7303 Prediabetes: Secondary | ICD-10-CM

## 2021-01-20 DIAGNOSIS — Z6841 Body Mass Index (BMI) 40.0 and over, adult: Secondary | ICD-10-CM

## 2021-01-20 DIAGNOSIS — I10 Essential (primary) hypertension: Secondary | ICD-10-CM | POA: Diagnosis not present

## 2021-01-20 NOTE — Progress Notes (Signed)
Chief Complaint:   OBESITY Brandi [name] is here to discuss her progress with her obesity treatment plan along with follow-up of her obesity related diagnoses. Brandi [name] is on the Category 1 Plan and states she is following her eating plan approximately 100% of the time. Brandi [name] states she is being more active.   Today's visit was #: 7 Starting weight: 273 lbs Starting date: 08/25/2020 Today's weight: 229 lbs Today's date: 01/20/2021 Total lbs lost to date: 44 Total lbs lost since last in-office visit: 5  Interim History: Brandi [name] is down another 5 lbs since her last visit. She denies any struggles and she is satisfied with the food. She is keeping her protein high.  Subjective:   1. Pre-diabetes Brandi [name] is taking Ozempic currently.  2. Essential hypertension Brandi [name] is taking Nebivolol, Norvasc, Aldactone, and Diovan.  Assessment/Plan:   1. Pre-diabetes Brandi [name] will continue Ozempic and will continue to work on weight loss, exercise, and decreasing simple carbohydrates to help decrease the risk of diabetes.   2. Essential hypertension Brandi [name] will continue her medications, and will continue working on healthy weight loss and exercise to improve blood pressure control. We will watch for signs of hypotension as she continues her lifestyle modifications.  3. Obesity, current BMI 39.31 Brandi [name] is currently in the action stage of change. As such, her goal is to continue with weight loss efforts. She has agreed to the Category 1 Plan.   Brandi [name] will continue to adhere closely to the meal plan.   Exercise goals: Walking some and steps, and walking with bands and weights.   Behavioral modification strategies: increasing lean protein intake, decreasing simple carbohydrates, increasing vegetables, increasing water intake, decreasing eating out, no skipping meals, meal planning and cooking strategies, keeping healthy foods in the home, and planning for success.  Brandi [name] has agreed to follow-up with our clinic  in 3 weeks. She was informed of the importance of frequent follow-up visits to maximize her success with intensive lifestyle modifications for her multiple health conditions.   Objective:   Blood pressure 114/81, pulse 90, temperature 97.8 F (36.6 C), height 5\' 4"  (1.626 m), weight 229 lb (103.9 kg), SpO2 100 %. Body mass index is 39.31 kg/m.  Brandi [name] is using a cane for ambulation. General: Cooperative, alert, well developed, in no acute distress. HEENT: Conjunctivae and lids unremarkable. Cardiovascular: Regular rhythm.  Lungs: Normal work of breathing. Neurologic: No focal deficits.   Lab Results  Component Value Date   CREATININE 1.25 (H) 11/23/2020   BUN 33 (H) 11/23/2020   NA 135 11/23/2020   K 4.6 11/23/2020   CL 101 11/23/2020   CO2 17 (L) 11/23/2020   Lab Results  Component Value Date   ALT 14 10/09/2019   AST 18 10/09/2019   ALKPHOS 200 (H) 10/09/2019   BILITOT 0.4 10/09/2019   Lab Results  Component Value Date   HGBA1C 5.8 (H) 08/25/2020   HGBA1C 6.0 (H) 06/30/2020   HGBA1C 6.0 (H) 03/11/2020   HGBA1C 6.1 (H) 10/09/2019   HGBA1C 6.6 (H) 01/06/2019   Lab Results  Component Value Date   INSULIN 19.6 08/25/2020   Lab Results  Component Value Date   TSH 1.880 08/25/2020   Lab Results  Component Value Date   CHOL 123 08/25/2020   HDL 47 08/25/2020   LDLCALC 58 08/25/2020   TRIG 97 08/25/2020   CHOLHDL 2.6 03/11/2020   Lab Results  Component Value Date   VD25OH 105.0 (H) 08/25/2020   Lab Results  Component Value Date   WBC 7.2 01/27/2019   HGB 12.4 01/27/2019   HCT 38.4 01/27/2019   MCV 90 01/27/2019   PLT 275 01/27/2019   No results found for: IRON, TIBC, FERRITIN  Attestation Statements:   Reviewed by clinician on day of visit: allergies, medications, problem list, medical history, surgical history, family history, social history, and previous encounter notes.   Wilhemena Durie, am acting as Location manager for [employer], DO.  I  have reviewed the above documentation for accuracy and completeness, and I agree with the above. [name], DO

## 2021-02-02 ENCOUNTER — Other Ambulatory Visit: Payer: Self-pay

## 2021-02-02 ENCOUNTER — Encounter (INDEPENDENT_AMBULATORY_CARE_PROVIDER_SITE_OTHER): Payer: Self-pay | Admitting: Bariatrics

## 2021-02-02 ENCOUNTER — Ambulatory Visit (INDEPENDENT_AMBULATORY_CARE_PROVIDER_SITE_OTHER): Payer: Medicare Other | Admitting: Bariatrics

## 2021-02-02 VITALS — BP 109/78 | HR 95 | Temp 97.3°F | Ht 64.0 in | Wt 226.0 lb

## 2021-02-02 DIAGNOSIS — I10 Essential (primary) hypertension: Secondary | ICD-10-CM

## 2021-02-02 DIAGNOSIS — E7849 Other hyperlipidemia: Secondary | ICD-10-CM | POA: Diagnosis not present

## 2021-02-02 DIAGNOSIS — Z6841 Body Mass Index (BMI) 40.0 and over, adult: Secondary | ICD-10-CM | POA: Diagnosis not present

## 2021-02-02 NOTE — Progress Notes (Signed)
Chief Complaint:   OBESITY Brandi [name] is here to discuss her progress with her obesity treatment plan along with follow-up of her obesity related diagnoses. Brandi [name] is on the Category 1 Plan and states she is following her eating plan approximately 100% of the time. Brandi [name] states she is walking for 20 minutes 7 times per week.  Today's visit was #: 8 Starting weight: 273 lbs Starting date: 08/25/2020 Today's weight: 226 lbs Today's date: 02/02/2021 Total lbs lost to date: 47 lbs Total lbs lost since last in-office visit: 3 lbs  Interim History: Brandi [name] is down an additional 3 lbs since her last visit. She is doing well with her water intake.  Subjective:   1. Essential hypertension Brandi [name]'s blood pressure is controlled. Her last blood pressure was 109/78.  2. Other hyperlipidemia Brandi [name] is currently taking Lipitor.   Assessment/Plan:   1. Essential hypertension Brandi [name] will continue her medications. She is working on healthy weight loss and exercise to improve blood pressure control. We will watch for signs of hypotension as she continues her lifestyle modifications.  2. Other hyperlipidemia Cardiovascular risk and specific lipid/LDL goals reviewed.  We discussed several lifestyle modifications today and Chirsty will continue to work on diet, exercise and weight loss efforts. Ahsley will continue medications. Orders and follow up as documented in patient record.   Counseling Intensive lifestyle modifications are the first line treatment for this issue. Dietary changes: Increase soluble fiber. Decrease simple carbohydrates. Exercise changes: Moderate to vigorous-intensity aerobic activity 150 minutes per week if tolerated. Lipid-lowering medications: see documented in medical record.   3. Obesity, current BMI 38.8 Brandi [name] is currently in the action stage of change. As such, her goal is to continue with weight loss efforts. She has agreed to the Category 1 Plan.   Brandi [name] will continue meal  planning. She will be mindful eating. She will continue to adhere closely to the plan.   Exercise goals:  Brandi [name] fell in the hall using a cane. She is going to therapy.  Behavioral modification strategies: increasing lean protein intake, decreasing simple carbohydrates, increasing vegetables, increasing water intake, decreasing eating out, no skipping meals, meal planning and cooking strategies, keeping healthy foods in the home, and planning for success.  Brandi [name] has agreed to follow-up with our clinic in 2-3 weeks. She was informed of the importance of frequent follow-up visits to maximize her success with intensive lifestyle modifications for her multiple health conditions.   Objective:   Blood pressure 109/78, pulse 95, temperature (!) 97.3 F (36.3 C), height 5\' 4"  (1.626 m), weight 226 lb (102.5 kg), SpO2 100 %. Body mass index is 38.79 kg/m.  General: Cooperative, alert, well developed, in no acute distress. HEENT: Conjunctivae and lids unremarkable. Cardiovascular: Regular rhythm.  Lungs: Normal work of breathing. Neurologic: No focal deficits.   Lab Results  Component Value Date   CREATININE 1.25 (H) 11/23/2020   BUN 33 (H) 11/23/2020   NA 135 11/23/2020   K 4.6 11/23/2020   CL 101 11/23/2020   CO2 17 (L) 11/23/2020   Lab Results  Component Value Date   ALT 14 10/09/2019   AST 18 10/09/2019   ALKPHOS 200 (H) 10/09/2019   BILITOT 0.4 10/09/2019   Lab Results  Component Value Date   HGBA1C 5.8 (H) 08/25/2020   HGBA1C 6.0 (H) 06/30/2020   HGBA1C 6.0 (H) 03/11/2020   HGBA1C 6.1 (H) 10/09/2019   HGBA1C 6.6 (H) 01/06/2019   Lab Results  Component Value Date   INSULIN  19.6 08/25/2020   Lab Results  Component Value Date   TSH 1.880 08/25/2020   Lab Results  Component Value Date   CHOL 123 08/25/2020   HDL 47 08/25/2020   LDLCALC 58 08/25/2020   TRIG 97 08/25/2020   CHOLHDL 2.6 03/11/2020   Lab Results  Component Value Date   VD25OH 105.0 (H) 08/25/2020    Lab Results  Component Value Date   WBC 7.2 01/27/2019   HGB 12.4 01/27/2019   HCT 38.4 01/27/2019   MCV 90 01/27/2019   PLT 275 01/27/2019   No results found for: IRON, TIBC, FERRITIN  Attestation Statements:   Reviewed by clinician on day of visit: allergies, medications, problem list, medical history, surgical history, family history, social history, and previous encounter notes.  I, [name], RMA, am acting as Location manager for [employer], DO.   I have reviewed the above documentation for accuracy and completeness, and I agree with the above. [name], DO

## 2021-02-03 ENCOUNTER — Other Ambulatory Visit: Payer: Federal, State, Local not specified - PPO | Admitting: *Deleted

## 2021-02-03 DIAGNOSIS — I5032 Chronic diastolic (congestive) heart failure: Secondary | ICD-10-CM

## 2021-02-04 ENCOUNTER — Encounter (INDEPENDENT_AMBULATORY_CARE_PROVIDER_SITE_OTHER): Payer: Self-pay | Admitting: Bariatrics

## 2021-02-04 LAB — BASIC METABOLIC PANEL
BUN/Creatinine Ratio: 27 (ref 12–28)
BUN: 34 mg/dL — ABNORMAL HIGH (ref 8–27)
CO2: 20 mmol/L (ref 20–29)
Calcium: 10.3 mg/dL (ref 8.7–10.3)
Chloride: 102 mmol/L (ref 96–106)
Creatinine, Ser: 1.26 mg/dL — ABNORMAL HIGH (ref 0.57–1.00)
Glucose: 88 mg/dL (ref 70–99)
Potassium: 5.3 mmol/L — ABNORMAL HIGH (ref 3.5–5.2)
Sodium: 136 mmol/L (ref 134–144)
eGFR: 47 mL/min/{1.73_m2} — ABNORMAL LOW (ref >59)

## 2021-02-08 ENCOUNTER — Other Ambulatory Visit: Payer: Self-pay | Admitting: *Deleted

## 2021-02-08 DIAGNOSIS — I10 Essential (primary) hypertension: Secondary | ICD-10-CM

## 2021-02-08 DIAGNOSIS — E875 Hyperkalemia: Secondary | ICD-10-CM

## 2021-02-08 MED ORDER — SPIRONOLACTONE 50 MG PO TABS: ORAL_TABLET | ORAL | 3 refills | Status: DC

## 2021-02-21 ENCOUNTER — Other Ambulatory Visit: Payer: Self-pay

## 2021-02-21 ENCOUNTER — Emergency Department (HOSPITAL_COMMUNITY): Payer: Medicare Other

## 2021-02-21 ENCOUNTER — Encounter (HOSPITAL_COMMUNITY): Payer: Self-pay

## 2021-02-21 ENCOUNTER — Emergency Department (HOSPITAL_COMMUNITY)
Admission: EM | Admit: 2021-02-21 | Discharge: 2021-02-22 | Disposition: A | Discharge status: Home / Self Care | Payer: Medicare Other | Attending: Emergency Medicine | Admitting: Emergency Medicine

## 2021-02-21 DIAGNOSIS — Z91040 Latex allergy status: Secondary | ICD-10-CM | POA: Diagnosis not present

## 2021-02-21 DIAGNOSIS — Z96642 Presence of left artificial hip joint: Secondary | ICD-10-CM | POA: Diagnosis not present

## 2021-02-21 DIAGNOSIS — M25552 Pain in left hip: Secondary | ICD-10-CM | POA: Diagnosis not present

## 2021-02-21 DIAGNOSIS — X58XXXA Exposure to other specified factors, initial encounter: Secondary | ICD-10-CM | POA: Diagnosis not present

## 2021-02-21 DIAGNOSIS — I10 Essential (primary) hypertension: Secondary | ICD-10-CM | POA: Diagnosis not present

## 2021-02-21 DIAGNOSIS — S73005A Unspecified dislocation of left hip, initial encounter: Secondary | ICD-10-CM | POA: Insufficient documentation

## 2021-02-21 DIAGNOSIS — Z471 Aftercare following joint replacement surgery: Secondary | ICD-10-CM | POA: Diagnosis not present

## 2021-02-21 DIAGNOSIS — Z20822 Contact with and (suspected) exposure to covid-19: Secondary | ICD-10-CM | POA: Diagnosis not present

## 2021-02-21 DIAGNOSIS — S73035A Other anterior dislocation of left hip, initial encounter: Secondary | ICD-10-CM

## 2021-02-21 DIAGNOSIS — Z79899 Other long term (current) drug therapy: Secondary | ICD-10-CM | POA: Diagnosis not present

## 2021-02-21 DIAGNOSIS — S79912A Unspecified injury of left hip, initial encounter: Secondary | ICD-10-CM | POA: Diagnosis not present

## 2021-02-21 DIAGNOSIS — T84021A Dislocation of internal left hip prosthesis, initial encounter: Secondary | ICD-10-CM | POA: Diagnosis not present

## 2021-02-21 LAB — BASIC METABOLIC PANEL
Anion gap: 4 — ABNORMAL LOW (ref 5–15)
BUN: 28 mg/dL — ABNORMAL HIGH (ref 8–23)
CO2: 23 mmol/L (ref 22–32)
Calcium: 9.4 mg/dL (ref 8.9–10.3)
Chloride: 108 mmol/L (ref 98–111)
Creatinine, Ser: 1.29 mg/dL — ABNORMAL HIGH (ref 0.44–1.00)
GFR, Estimated: 45 mL/min — ABNORMAL LOW (ref >60)
Glucose, Bld: 107 mg/dL — ABNORMAL HIGH (ref 70–99)
Potassium: 4.8 mmol/L (ref 3.5–5.1)
Sodium: 135 mmol/L (ref 135–145)

## 2021-02-21 LAB — CBC
HCT: 38.8 % (ref 36.0–46.0)
Hemoglobin: 13 g/dL (ref 12.0–15.0)
MCH: 31.3 pg (ref 26.0–34.0)
MCHC: 33.5 g/dL (ref 30.0–36.0)
MCV: 93.5 fL (ref 80.0–100.0)
Platelets: 270 10*3/uL (ref 150–400)
RBC: 4.15 MIL/uL (ref 3.87–5.11)
RDW: 14.2 % (ref 11.5–15.5)
WBC: 8.7 10*3/uL (ref 4.0–10.5)
nRBC: 0 % (ref 0.0–0.2)

## 2021-02-21 LAB — RESP PANEL BY RT-PCR (FLU A&B, COVID) ARPGX2
Influenza A by PCR: NEGATIVE
Influenza B by PCR: NEGATIVE
SARS Coronavirus 2 by RT PCR: NEGATIVE

## 2021-02-21 MED ORDER — FENTANYL CITRATE PF 50 MCG/ML IJ SOSY
50.0000 ug | PREFILLED_SYRINGE | Freq: Once | INTRAMUSCULAR | Status: AC
  Administered 2021-02-21: 12:00:00 50 ug via INTRAVENOUS
  Filled 2021-02-21: qty 1

## 2021-02-21 MED ORDER — PROPOFOL 10 MG/ML IV BOLUS
INTRAVENOUS | Status: AC
  Filled 2021-02-21: qty 20

## 2021-02-21 MED ORDER — PROPOFOL 10 MG/ML IV BOLUS
0.5000 mg/kg | Freq: Once | INTRAVENOUS | Status: DC
  Filled 2021-02-21: qty 20

## 2021-02-21 MED ORDER — TRAMADOL HCL 50 MG PO TABS
50.0000 mg | ORAL_TABLET | Freq: Four times a day (QID) | ORAL | Status: DC | PRN
  Administered 2021-02-21: 50 mg via ORAL
  Filled 2021-02-21: qty 1

## 2021-02-21 MED ORDER — ROPINIROLE HCL 1 MG PO TABS: 2.0000 mg | ORAL_TABLET | Freq: Two times a day (BID) | ORAL | Status: DC

## 2021-02-21 MED ORDER — PROPOFOL 10 MG/ML IV BOLUS
INTRAVENOUS | Status: AC | PRN
  Administered 2021-02-21: 50 mg via INTRAVENOUS
  Administered 2021-02-21: 75 mg via INTRAVENOUS
  Administered 2021-02-21: 25 mg via INTRAVENOUS
  Administered 2021-02-21: 30 mg via INTRAVENOUS
  Administered 2021-02-21: 20 ug via INTRAVENOUS

## 2021-02-21 MED ORDER — IRBESARTAN 150 MG PO TABS
150.0000 mg | ORAL_TABLET | Freq: Every day | ORAL | Status: DC
  Administered 2021-02-22: 150 mg via ORAL
  Filled 2021-02-21: qty 1

## 2021-02-21 MED ORDER — AMLODIPINE BESYLATE 5 MG PO TABS
7.5000 mg | ORAL_TABLET | Freq: Every day | ORAL | Status: DC
  Administered 2021-02-22: 7.5 mg via ORAL
  Filled 2021-02-21: qty 2

## 2021-02-21 MED ORDER — FENTANYL CITRATE PF 50 MCG/ML IJ SOSY
PREFILLED_SYRINGE | INTRAMUSCULAR | Status: AC
  Filled 2021-02-21: qty 1

## 2021-02-21 MED ORDER — PANTOPRAZOLE SODIUM 40 MG PO TBEC
40.0000 mg | DELAYED_RELEASE_TABLET | Freq: Two times a day (BID) | ORAL | Status: DC
  Administered 2021-02-22: 40 mg via ORAL
  Filled 2021-02-21: qty 1

## 2021-02-21 MED ORDER — FENTANYL CITRATE (PF) 100 MCG/2ML IJ SOLN
INTRAMUSCULAR | Status: AC | PRN
  Administered 2021-02-21: 50 ug via INTRAVENOUS

## 2021-02-21 MED ORDER — NEBIVOLOL HCL 10 MG PO TABS
20.0000 mg | ORAL_TABLET | Freq: Every day | ORAL | Status: DC
  Administered 2021-02-22: 20 mg via ORAL
  Filled 2021-02-21: qty 2

## 2021-02-21 MED ORDER — ATORVASTATIN CALCIUM 40 MG PO TABS
40.0000 mg | ORAL_TABLET | Freq: Every day | ORAL | Status: DC
  Administered 2021-02-22: 40 mg via ORAL
  Filled 2021-02-21: qty 1

## 2021-02-21 MED ORDER — TOPIRAMATE 25 MG PO TABS
50.0000 mg | ORAL_TABLET | Freq: Two times a day (BID) | ORAL | Status: DC
  Administered 2021-02-21 – 2021-02-22 (×2): 50 mg via ORAL
  Filled 2021-02-21 (×2): qty 2

## 2021-02-21 MED ORDER — PROPOFOL 10 MG/ML IV BOLUS
INTRAVENOUS | Status: AC | PRN
  Administered 2021-02-21: 25 mg via INTRAVENOUS
  Administered 2021-02-21: 50 mg via INTRAVENOUS

## 2021-02-21 NOTE — Progress Notes (Signed)
Orthopedic Tech Progress Note Patient Details:  Brandi [name] [facility] 1955-03-28 [id number]  Patient ID: Brandi [name], female   DOB: 1956-01-15, 65 y.o.   MRN: [id number]  Brandi [name] 02/21/2021, 2:56 PM2 attempt at left knee reduction.   Successful

## 2021-02-21 NOTE — Progress Notes (Signed)
Orthopedic Tech Progress Note Patient Details:  Chalese Peach [facility] 11/11/1955 [id number]  Patient ID: [name], female   DOB: 04-26-1955, 65 y.o.   MRN: [id number]  [place] Pink 02/21/2021, 1:04 PMleft hip reduction

## 2021-02-21 NOTE — ED Notes (Signed)
125mg  Propofol wasted w/ Sharrie Rothman Ward RN

## 2021-02-21 NOTE — ED Notes (Addendum)
Pt received graham crackers and signed the med waiver

## 2021-02-21 NOTE — Consult Note (Signed)
ORTHOPAEDIC CONSULTATION  REQUESTING PHYSICIAN: Pattricia Boss, MD  Chief Complaint: Left hip pain  HPI: Brandi [name] is a 65 y.o. 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  She is unable to recall the specific doctor's name.  She reports that she was doing well until this injury.  She had a a twist in the shower when this occurred.  Past Medical History:  Diagnosis Date   Anemia    Arthritis    Back pain    Edema, lower extremity    Food allergy    GERD (gastroesophageal reflux disease)    Hypertension    Hypertensive crisis    Joint pain    Morbid obesity ([place])    Osteoporosis    Pre-diabetes    Rheumatoid arthritis ([place])    Sciatica neuralgia    Sleep apnea    Past Surgical History:  Procedure Laterality Date   ABDOMINAL HYSTERECTOMY     PARTIAL   CHOLECYSTECTOMY     PROTESTIC HIPS     PROTESTIC KNEE     Social History   Socioeconomic History   Marital status: Single    Spouse name: Not on file   Number of children: Not on file   Years of education: Not on file   Highest education level: Not on file  Occupational History   Occupation: Retired Medical illustrator Nurseb  Tobacco Use   Smoking status: Never   Smokeless tobacco: Never  Vaping Use   Vaping Use: Never used  Substance and Sexual Activity   Alcohol use: Not Currently   Drug use: Not Currently   Sexual activity: Not on file  Other Topics Concern   Not on file  Social History Narrative   Lives alone   Right handed   Drinks no caffeine   Social Determinants of Health   Financial Resource Strain: Low Risk    Difficulty of Paying Living Expenses: Not hard at all  Food Insecurity: No Food Insecurity   Worried About Charity fundraiser in the Last Year: Never true   [place] in the Last Year: Never true  Transportation Needs: No Transportation Needs   Lack of Transportation  (Medical): No   Lack of Transportation (Non-Medical): No  Physical Activity: Inactive   Days of Exercise per Week: 0 days   Minutes of Exercise per Session: 0 min  Stress: No Stress Concern Present   Feeling of Stress : Not at all  Social Connections: Not on file   Family History  Problem Relation Age of Onset   Hypertension Mother    Cancer Mother    Heart disease Mother    Eating disorder Mother    Obesity Mother    Kidney disease Father    Heart disease Father    Diabetes Father    High blood pressure Father    High Cholesterol Father    Anxiety disorder Father    Bipolar disorder Father    Schizophrenia Father    Hypertension Sister    - negative except otherwise stated in the family history section Allergies  Allergen Reactions   Hydralazine Itching   Meperidine Hcl Other (See Comments)   Aliskiren Hives   Aspirin Nausea Only   Chocolate    Juniper Berries    Latex     hives   Oxycodone-Acetaminophen Itching   Rofecoxib Diarrhea   Spironolactone Other (See Comments)  Furosemide Other (See Comments)    Rash and extreme dehydration   Sulfa Antibiotics Rash   Prior to Admission medications   Medication Sig Start Date End Date Taking? Authorizing Provider  amLODipine (NORVASC) 5 MG tablet Take 1.5 tablets (7.5 mg total) by mouth daily. 11/04/20   Belva Crome, MD  atorvastatin (LIPITOR) 40 MG tablet TAKE 1 TABLET BY MOUTH EVERY DAY 08/16/20   [name], FNP  Biotin 1000 MCG tablet Take 1,000 mcg by mouth daily.    [provider]  cetirizine (ZYRTEC) 10 MG tablet Take 10 mg by mouth daily.    [provider]  cyclobenzaprine (FLEXERIL) 10 MG tablet TAKE 1 TABLET(10 MG) BY MOUTH THREE TIMES DAILY 11/15/20   [name], FNP  fluticasone (FLONASE) 50 MCG/ACT nasal spray Flonase Allergy Relief 50 mcg/actuation nasal spray,suspension  INSTILL 2 SPRAYS IN EACH NOSTRIL QD    [provider]  Magnesium 500 MG TABS Take 1 tablet by mouth  daily.    [provider]  Nebivolol HCl (BYSTOLIC) 20 MG TABS Take 1 tablet by mouth once daily 12/31/20   Belva Crome, MD  omeprazole (PRILOSEC) 20 MG capsule Take 20 mg by mouth daily.    [provider]  [place] 30MG  BID    [provider]  PRESCRIPTION MEDICATION HEPATO-CI BID    [provider]  PRESCRIPTION MEDICATION OMEGA CO3-SE BID    [provider]  PRESCRIPTION MEDICATION b12 GD ONE DROPPER    [provider]  rOPINIRole (REQUIP) 2 MG tablet Take 1 tablet by mouth 2 (two) times daily.    [provider]  Semaglutide, 1 MG/DOSE, (OZEMPIC, 1 MG/DOSE,) 4 MG/3ML SOPN Inject 1 mg into the skin once a week. 12/28/20   Belva Crome, MD  spironolactone (ALDACTONE) 50 MG tablet Take one tablet by mouth on Monday, Tuesday, Thursday and Friday. 02/08/21   Belva Crome, MD  topiramate (TOPAMAX) 50 MG tablet Take 1 tablet (50 mg total) by mouth 2 (two) times daily. 06/24/20   Frann Rider, NP  traMADol (ULTRAM) 50 MG tablet Take 1 tablet (50 mg total) by mouth every 6 (six) hours as needed. 11/02/20 11/02/21  [name], FNP  valsartan (DIOVAN) 160 MG tablet Take 1 tablet (160 mg total) by mouth daily. 11/04/20   Belva Crome, MD  Vitamin E 180 MG CAPS Take 1 capsule by mouth daily.    [provider]   DG Pelvis Portable  Result Date: 02/21/2021 CLINICAL DATA:  Left hip dislocation. EXAM: PORTABLE PELVIS 1-2 VIEWS COMPARISON:  Left hip radiographs 02/21/2021 at 11:51 a.m. FINDINGS: Prosthetic left hip remains displaced.  No fractures are present. IMPRESSION: Persistent dislocation of left hip prosthesis. Electronically Signed   By: San Morelle M.D.   On: 02/21/2021 13:27   DG HIP UNILAT WITH PELVIS 2-3 VIEWS LEFT  Result Date: 02/21/2021 CLINICAL DATA:  Left hip reduction. EXAM: DG HIP (WITH OR WITHOUT PELVIS) 2-3V LEFT COMPARISON:  02/21/2021 at 1151 hours. FINDINGS: Interval  restoration of anatomic alignment in a left hip arthroplasty. No definite fracture. IMPRESSION: Interval reduction a left total hip arthroplasty dislocation. Electronically Signed   By: Lorin Picket M.D.   On: 02/21/2021 14:54   DG Hip Unilat W or Wo Pelvis 2-3 Views Left  Result Date: 02/21/2021 CLINICAL DATA:  Leg gave out in shower EXAM: DG HIP (WITH OR WITHOUT PELVIS) 2-3V LEFT COMPARISON:  None. FINDINGS: Anterolateral dislocation of the left  femoral head prosthesis from the acetabular prosthesis. No fracture. Right hip arthroplasty components project in expected location. IMPRESSION: Anterolateral dislocation of left hip arthroplasty. Electronically Signed   By: Lucrezia Europe M.D.   On: 02/21/2021 12:03     Positive ROS: All other systems have been reviewed and were otherwise negative with the exception of those mentioned in the HPI and as above.  Physical Exam: General: No acute distress Cardiovascular: No pedal edema Respiratory: No cyanosis, no use of accessory musculature GI: No organomegaly, abdomen is soft and non-tender Skin: No lesions in the area of chief complaint Neurologic: Sensation intact distally Psychiatric: Patient is at baseline mood and affect Lymphatic: No axillary or cervical lymphadenopathy  MUSCULOSKELETAL:  Left hip is shortened and externally rotated.  Sensation is intact in all distributions of the left foot.  Stable to dorsiflex and plantarflex the left foot she able to extend and flex the left knee 2+ dorsalis pedis pulse  Independent Imaging Review: X-ray 2 views pre and postreduction  Left anterior hip dislocation with subsequent concentric reduction Assessment: 65 year old female with left anterior prosthetic hip dislocation after a trip in the shower  Plan: Status post closed reduction of the left hip with concentric reduction.  I have advised on being in an abduction pillow while she sleeps.  They will be instructed in abduction and specific  anterior hip precautions.  She will follow-up with me in 2 weeks  Thank you for the consult and the opportunity to see Ms. Mode  Vanetta Mulders, MD [facility] 3:13 PM

## 2021-02-21 NOTE — ED Provider Notes (Signed)
[facility] DEPT Provider Note   CSN: [id number] Arrival date & time: 02/21/21  1048     History Chief Complaint  Patient presents with   Hip Pain    Brandi [name] is a 65 y.o. female.  HPI 65 year old female history of bilateral hip prosthesis 81-51 year old ago in [place].  She was in the bath today when her hip popped and she had deformity and pain.  She did not fall.  She has not had similar symptoms in the past.  She has not had anything to eat or drink today.  She is not on blood thinners.     Past Medical History:  Diagnosis Date   Anemia    Arthritis    Back pain    Edema, lower extremity    Food allergy    GERD (gastroesophageal reflux disease)    Hypertension    Hypertensive crisis    Joint pain    Morbid obesity ([place])    Osteoporosis    Pre-diabetes    Rheumatoid arthritis ([place])    Sciatica neuralgia    Sleep apnea     Patient Active Problem List   Diagnosis Date Noted   Hip dislocation, left ([place])    Chronic pain 12/16/2020   Hyperlipidemia 12/16/2020   Low back pain 12/16/2020   Posttraumatic headache 12/16/2020   Prediabetes 10/09/2019   Essential hypertension 10/09/2019   Morbid obesity ([place]) 06/09/2019    Past Surgical History:  Procedure Laterality Date   ABDOMINAL HYSTERECTOMY     PARTIAL   CHOLECYSTECTOMY     PROTESTIC HIPS     PROTESTIC KNEE       OB History     Gravida  3   Para  2   Term      Preterm      AB      Living         SAB      IAB      Ectopic      Multiple      Live Births              Family History  Problem Relation Age of Onset   Hypertension Mother    Cancer Mother    Heart disease Mother    Eating disorder Mother    Obesity Mother    Kidney disease Father    Heart disease Father    Diabetes Father    High blood pressure Father    High Cholesterol Father    Anxiety disorder Father    Bipolar disorder Father    Schizophrenia Father     Hypertension Sister     Social History   Tobacco Use   Smoking status: Never   Smokeless tobacco: Never  Vaping Use   Vaping Use: Never used  Substance Use Topics   Alcohol use: Not Currently   Drug use: Not Currently    Home Medications Prior to Admission medications   Medication Sig Start Date End Date Taking? Authorizing Provider  amLODipine (NORVASC) 5 MG tablet Take 1.5 tablets (7.5 mg total) by mouth daily. 11/04/20   Belva Crome, MD  atorvastatin (LIPITOR) 40 MG tablet TAKE 1 TABLET BY MOUTH EVERY DAY 08/16/20   [name], FNP  Biotin 1000 MCG tablet Take 1,000 mcg by mouth daily.    [provider]  cetirizine (ZYRTEC) 10 MG tablet Take 10 mg by mouth daily.    [provider]  cyclobenzaprine (FLEXERIL) 10  MG tablet TAKE 1 TABLET(10 MG) BY MOUTH THREE TIMES DAILY 11/15/20   [name], FNP  fluticasone (FLONASE) 50 MCG/ACT nasal spray Flonase Allergy Relief 50 mcg/actuation nasal spray,suspension  INSTILL 2 SPRAYS IN EACH NOSTRIL QD    [provider]  Magnesium 500 MG TABS Take 1 tablet by mouth daily.    [provider]  Nebivolol HCl (BYSTOLIC) 20 MG TABS Take 1 tablet by mouth once daily 12/31/20   Belva Crome, MD  omeprazole (PRILOSEC) 20 MG capsule Take 20 mg by mouth daily.    [provider]  [place] 30MG  BID    [provider]  PRESCRIPTION MEDICATION HEPATO-CI BID    [provider]  PRESCRIPTION MEDICATION OMEGA CO3-SE BID    [provider]  PRESCRIPTION MEDICATION b12 GD ONE DROPPER    [provider]  rOPINIRole (REQUIP) 2 MG tablet Take 1 tablet by mouth 2 (two) times daily.    [provider]  Semaglutide, 1 MG/DOSE, (OZEMPIC, 1 MG/DOSE,) 4 MG/3ML SOPN Inject 1 mg into the skin once a week. 12/28/20   Belva Crome, MD  spironolactone (ALDACTONE) 50 MG tablet Take one tablet by mouth on Monday, Tuesday, Thursday and Friday. 02/08/21    Belva Crome, MD  topiramate (TOPAMAX) 50 MG tablet Take 1 tablet (50 mg total) by mouth 2 (two) times daily. 06/24/20   Frann Rider, NP  traMADol (ULTRAM) 50 MG tablet Take 1 tablet (50 mg total) by mouth every 6 (six) hours as needed. 11/02/20 11/02/21  [name], FNP  valsartan (DIOVAN) 160 MG tablet Take 1 tablet (160 mg total) by mouth daily. 11/04/20   Belva Crome, MD  Vitamin E 180 MG CAPS Take 1 capsule by mouth daily.    [provider]    Allergies    Hydralazine, Meperidine hcl, Aliskiren, Aspirin, Chocolate, Juniper berries, Latex, Oxycodone-acetaminophen, Rofecoxib, Spironolactone, Furosemide, and Sulfa antibiotics  Review of Systems   Review of Systems  All other systems reviewed and are negative.  Physical Exam Updated Vital Signs BP (!) 110/54   Pulse 79   Temp 98.3 F (36.8 C)   Resp 13   Ht 1.626 m (5\' 4" )   Wt 108.4 kg   SpO2 100%   BMI 41.02 kg/m   Physical Exam Vitals and nursing note reviewed.  Constitutional:      Appearance: Normal appearance. She is obese.  HENT:     Head: Normocephalic.     Right Ear: External ear normal.     Left Ear: External ear normal.     Nose: Nose normal.     Mouth/Throat:     Pharynx: Oropharynx is clear.  Eyes:     Pupils: Pupils are equal, round, and reactive to light.  Cardiovascular:     Rate and Rhythm: Normal rate.  Pulmonary:     Effort: Pulmonary effort is normal.  Abdominal:     General: Abdomen is flat.  Musculoskeletal:     Cervical back: Normal range of motion.     Comments: Left leg shortened externally rotated Pulses intact Sensation intact  Skin:    General: Skin is warm and dry.     Capillary Refill: Capillary refill takes less than 2 seconds.  Neurological:     General: No focal deficit present.     Mental Status: She is alert.  Psychiatric:        Mood and Affect: Mood normal.    ED Results /  Procedures / Treatments   Labs (all labs ordered are listed, but only abnormal  results are displayed) Labs Reviewed  BASIC METABOLIC PANEL - Abnormal; Notable for the following components:      Result Value   Glucose, Bld 107 (*)    BUN 28 (*)    Creatinine, Ser 1.29 (*)    GFR, Estimated 45 (*)    Anion gap 4 (*)    All other components within normal limits  RESP PANEL BY RT-PCR (FLU A&B, COVID) ARPGX2  CBC    EKG None  Radiology DG Pelvis Portable  Result Date: 02/21/2021 CLINICAL DATA:  Left hip dislocation. EXAM: PORTABLE PELVIS 1-2 VIEWS COMPARISON:  Left hip radiographs 02/21/2021 at 11:51 a.m. FINDINGS: Prosthetic left hip remains displaced.  No fractures are present. IMPRESSION: Persistent dislocation of left hip prosthesis. Electronically Signed   By: San Morelle M.D.   On: 02/21/2021 13:27   DG HIP UNILAT WITH PELVIS 2-3 VIEWS LEFT  Result Date: 02/21/2021 CLINICAL DATA:  Left hip reduction. EXAM: DG HIP (WITH OR WITHOUT PELVIS) 2-3V LEFT COMPARISON:  02/21/2021 at 1151 hours. FINDINGS: Interval restoration of anatomic alignment in a left hip arthroplasty. No definite fracture. IMPRESSION: Interval reduction a left total hip arthroplasty dislocation. Electronically Signed   By: Lorin Picket M.D.   On: 02/21/2021 14:54   DG Hip Unilat W or Wo Pelvis 2-3 Views Left  Result Date: 02/21/2021 CLINICAL DATA:  Leg gave out in shower EXAM: DG HIP (WITH OR WITHOUT PELVIS) 2-3V LEFT COMPARISON:  None. FINDINGS: Anterolateral dislocation of the left femoral head prosthesis from the acetabular prosthesis. No fracture. Right hip arthroplasty components project in expected location. IMPRESSION: Anterolateral dislocation of left hip arthroplasty. Electronically Signed   By: Lucrezia Europe M.D.   On: 02/21/2021 12:03    Procedures .Ortho Injury Treatment  Date/Time: 02/21/2021 2:03 PM Performed by: Pattricia Boss, MD Authorized by: Pattricia Boss, MD   Consent:    Consent obtained:  Written   Consent given by:  Patient   Risks discussed:  Fracture,  nerve damage and vascular damage   Alternatives discussed:  No treatmentInjury location: hip Location details: right hip Injury type: dislocation Dislocation type: anterior Spontaneous dislocation: yes Prosthesis: yes Pre-procedure neurovascular assessment: neurovascularly intact Pre-procedure distal perfusion: normal Pre-procedure neurological function: normal Pre-procedure range of motion: reduced  Anesthesia: Local anesthesia used: no  Patient sedated: Yes. Refer to sedation procedure documentation for details of sedation. Manipulation performed: yes Reduction successful: no X-Kevyn Boquet confirmed reduction: yes Post-procedure distal perfusion: normal   .Sedation  Date/Time: 02/21/2021 12:30 PM Performed by: Pattricia Boss, MD Authorized by: Pattricia Boss, MD   Consent:    Consent obtained:  Written   Consent given by:  Patient   Risks discussed:  Prolonged hypoxia resulting in organ damage, dysrhythmia, prolonged sedation necessitating reversal, inadequate sedation and nausea Universal protocol:    Immediately prior to procedure, a time out was called: yes     Patient identity confirmed:  Arm band and verbally with patient Indications:    Procedure performed:  Dislocation reduction   Procedure necessitating sedation performed by:  Physician performing sedation Pre-sedation assessment:    Time since last food or drink:  12   ASA classification: class 3 - patient with severe systemic disease     Mouth opening:  3 or more finger widths   Thyromental distance:  3 finger widths   Mallampati score:  I - soft palate, uvula, fauces, pillars visible   Neck  mobility: normal     Pre-sedation assessments completed and reviewed: airway patency     Pre-sedation assessment completed:  02/21/2021 12:30 PM Immediate pre-procedure details:    Reassessment: Patient reassessed immediately prior to procedure     Reviewed: vital signs, relevant labs/tests and NPO status     Verified: bag  valve mask available, emergency equipment available, intubation equipment available, IV patency confirmed, oxygen available and suction available   Procedure details (see MAR for exact dosages):    Preoxygenation:  Room air   Sedation:  Propofol   Intended level of sedation: deep   Intra-procedure monitoring:  Blood pressure monitoring, continuous capnometry, frequent LOC assessments, frequent vital sign checks, continuous pulse oximetry and cardiac monitor   Intra-procedure events: none     Intra-procedure management:  Airway repositioning   Total Provider sedation time (minutes):  20 Post-procedure details:    Post-sedation assessment completed:  02/21/2021 1:00 PM   Attendance: Constant attendance by certified staff until patient recovered     Recovery: Patient returned to pre-procedure baseline     Post-sedation assessments completed and reviewed: airway patency, cardiovascular function and mental status     Patient is stable for discharge or admission: yes     Procedure completion:  Tolerated well, no immediate complications .Sedation  Date/Time: 02/21/2021 2:00 PM Performed by: Pattricia Boss, MD Authorized by: Pattricia Boss, MD   Consent:    Consent obtained:  Written   Consent given by:  Patient   Risks discussed:  Allergic reaction, dysrhythmia, prolonged hypoxia resulting in organ damage, prolonged sedation necessitating reversal, inadequate sedation and respiratory compromise necessitating ventilatory assistance and intubation   Alternatives discussed:  Analgesia without sedation Universal protocol:    Immediately prior to procedure, a time out was called: yes   Indications:    Procedure performed:  Dislocation reduction Pre-sedation assessment:    Time since last food or drink:  15   ASA classification: class 3 - patient with severe systemic disease     Mouth opening:  3 or more finger widths   Thyromental distance:  3 finger widths   Mallampati score:  I - soft palate,  uvula, fauces, pillars visible   Neck mobility: normal     Pre-sedation assessments completed and reviewed: airway patency, cardiovascular function, mental status and respiratory function     Pre-sedation assessment completed:  02/21/2021 2:00 PM Immediate pre-procedure details:    Reassessment: Patient reassessed immediately prior to procedure     Verified: intubation equipment available   Procedure details (see MAR for exact dosages):    Preoxygenation:  Nasal cannula   Sedation:  Propofol   Intended level of sedation: deep   Analgesia:  Fentanyl   Intra-procedure monitoring:  Blood pressure monitoring, continuous capnometry, frequent LOC assessments, frequent vital sign checks, continuous pulse oximetry and cardiac monitor   Intra-procedure events: none     Intra-procedure management:  Airway repositioning   Total Provider sedation time (minutes):  20 Post-procedure details:    Post-sedation assessment completed:  02/21/2021 4:20 PM   Attendance: Constant attendance by certified staff until patient recovered     Recovery: Patient returned to pre-procedure baseline     Patient is stable for discharge or admission: yes     Procedure completion:  Tolerated well, no immediate complications   Medications Ordered in ED Medications  propofol (DIPRIVAN) 10 mg/mL bolus/IV push 54.2 mg (54.2 mg Intravenous See Procedure Record 02/21/21 1445)  fentaNYL (SUBLIMAZE) injection 50 mcg (50 mcg Intravenous See Procedure  Record 02/21/21 1457)  propofol (DIPRIVAN) 10 mg/mL bolus/IV push ( Intravenous See Procedure Record 02/21/21 1400)  propofol (DIPRIVAN) 10 mg/mL bolus/IV push (50 mg Intravenous Given 02/21/21 1445)  fentaNYL (SUBLIMAZE) injection (50 mcg Intravenous Given 02/21/21 1445)    ED Course  I have reviewed the triage vital signs and the nursing notes.  Pertinent labs & imaging results that were available during my care of the patient were reviewed by me and considered in my medical  decision making (see chart for details).  Clinical Course as of 02/21/21 1622  Mon Feb 21, 2021  1211 DG Hip Malvin Johns or Wo Pelvis 2-3 Views Left [DR]    Clinical Course User Index [DR] Pattricia Boss, MD   MDM Rules/Calculators/A&P                         Attempted hip reduction. Unsuccessful abduction. Discussed with Dr. Sammuel Hines who will come in to evaluate. Dr. Sammuel Hines reduction with sedation performed. Plan discharged home.  However, patient will need TOC. TOC has not evaluated patient yet. Discussed with Dr. Langston Masker who has assumed care and will assist with dispo after toc evaluates  Final Clinical Impression(s) / ED Diagnoses Final diagnoses:  Anterior dislocation of left hip, initial encounter [facility])    Rx / [place] Orders ED Discharge Orders     None        Pattricia Boss, MD 02/21/21 1649

## 2021-02-21 NOTE — Progress Notes (Signed)
Patient was a conscious sedation to reset her Left hip. Patient maintained saturations of 99-100% on 2lpm [place] for duration of procedure. Patient tolerated well with no distress. RT monitored for the duration.

## 2021-02-21 NOTE — Progress Notes (Signed)
Orthopedic Tech Progress Note Patient Details:  Cortny Bambach [facility] 1955/10/16 [id number]  Ortho Devices Type of Ortho Device: Knee Immobilizer Ortho Device/Splint Location: left Ortho Device/Splint Interventions: Application   Post Interventions Patient Tolerated: Well Instructions Provided: Care of device  [place] Pink 02/21/2021, 1:04 PM

## 2021-02-21 NOTE — Progress Notes (Signed)
RT was called for a second time to do this conscious sedation. The Ortho Physician came in to do at this time. Patient started out on 3lpm [place] and had to be placed on a 100% NRB for the duration of the procedure. RT monitored the entire procedure and 15 minutes post to wean patient back off the oxygen. She tolerated the procedure and is currently tolerating 3lpm [place].

## 2021-02-21 NOTE — Discharge Instructions (Addendum)
Please keep hip from rotating out as discussed with Dr. Sammuel Hines.  Call Dr. Eddie Dibbles office tomorrow for appointment for follow up in 2 weeks. Weight bear as tolerated

## 2021-02-21 NOTE — ED Triage Notes (Signed)
Pt brought in by ems for possible Left hip dislocation. Pt has previous bilateral hip & knee replacements. Pt states she was in the shower and her leg just "gave out". Pt denies fall, states she sat in shower chair upon leg giving out. +2 bilateral pedal pulses.

## 2021-02-21 NOTE — TOC Initial Note (Signed)
Transition of Care [facility]) - Initial/Assessment Note    Patient Details  Name: Brandi [name] MRN: [id number] Date of Birth: Aug 14, 1955  [facility] of [facility]) CM/SW Contact:    Joanne Chars, LCSW Phone Number: 02/21/2021, 5:53 PM  Clinical Narrative:     CSW consulted regarding DME, spoke with pt and daughter Lollie Marrow.  Permission given to speak with daughter present.  They are unclear what their follow up is outside of seeing orthopedic MD in 2 weeks.  Daughter reports that she is worried about whether pt can walk as she has not been up since the procedure earlier today.  Current DME in home: walker, cane, 3n1.  Daughter would like hospital bed, also requesting medical transport home and notes they have to go up 14 stairs to get inside.    CSW spoke to MD Trifan who ordered PT consult, will hold pt overnight.  Hospital bed ordered.  CSW spoke to pt and daughter and they are in agreement with this plan.  Hospital bed ordered from [facility].               Expected Discharge Plan:  (TBD-home.  Pending PT eval) Barriers to Discharge: Other (must enter comment) (PT eval, DME)   Patient Goals and CMS Choice        Expected Discharge Plan and Services Expected Discharge Plan:  (TBD-home.  Pending PT eval) In-house Referral: Clinical Social Work   Post Acute Care Choice: Durable Medical Equipment Living arrangements for the past 2 months: Single Family Home                 DME Arranged: Hospital bed DME Agency: [employer] Date DME Agency Contacted: 02/21/21 Time DME Agency Contacted: [phone] Representative spoke with at DME Agency: [place]            Prior Living Arrangements/Services Living arrangements for the past 2 months: [place] with:: Adult Children Patient language and need for interpreter reviewed:: Yes Do you feel safe going back to the place where you live?: Yes      Need for Family Participation in Patient Care: Yes (Comment) Care giver support  system in place?: Yes (comment)   Criminal Activity/Legal Involvement Pertinent to Current Situation/Hospitalization: No - Comment as needed  Activities of Daily Living Home Assistive Devices/Equipment: Eyeglasses, Cane (specify quad or straight), Walker (specify type), Shower chair with back ADL Screening (condition at time of admission) Patient's cognitive ability adequate to safely complete daily activities?: Yes Is the patient deaf or have difficulty hearing?: No Does the patient have difficulty seeing, even when wearing glasses/contacts?: No Does the patient have difficulty concentrating, remembering, or making decisions?: No Patient able to express need for assistance with ADLs?: Yes Does the patient have difficulty dressing or bathing?: Yes Independently performs ADLs?: No Communication: Independent Dressing (OT): Needs assistance Is this a change from baseline?: Change from baseline, expected to last <3days Grooming: Independent Feeding: Independent Bathing: Needs assistance Is this a change from baseline?: Change from baseline, expected to last <3 days Toileting: Needs assistance Is this a change from baseline?: Change from baseline, expected to last <3 days In/Out Bed: Needs assistance Is this a change from baseline?: Change from baseline, expected to last <3 days Walks in Home: Needs assistance Is this a change from baseline?: Change from baseline, expected to last <3 days Does the patient have difficulty walking or climbing stairs?: Yes Weakness of Legs: Left Weakness of Arms/Hands: None  Permission Sought/Granted Permission sought to  share information with : Family Supports Permission granted to share information with : Yes, Verbal Permission Granted  Share Information with NAME: daughter Lollie Marrow           Emotional Assessment Appearance:: Appears stated age Attitude/Demeanor/Rapport: Engaged Affect (typically observed): Appropriate, Pleasant Orientation: :   (not recorded, appears oriented) Alcohol / Substance Use: Not Applicable Psych Involvement: No (comment)  Admission diagnosis:  hip pain Patient Active Problem List   Diagnosis Date Noted   Hip dislocation, left ([place])    Chronic pain 12/16/2020   Hyperlipidemia 12/16/2020   Low back pain 12/16/2020   Posttraumatic headache 12/16/2020   Prediabetes 10/09/2019   Essential hypertension 10/09/2019   Morbid obesity ([place]) 06/09/2019   PCP:  [name], [place] Pharmacy:   [facility] [place], [place] - [address] AT [address] [place] [address] Phone: [phone] Fax: [phone]     Social Determinants of Health (SDOH) Interventions    Readmission Risk Interventions No flowsheet data found.

## 2021-02-21 NOTE — Procedures (Signed)
The patient was identified and the correct left prosthetic hip dislocation side was noted according to universal protocol with nursing.  Anesthesia subsequently performed a propofol sedation.  With firm pressure on bilateral ASIS joints the leg was pulled with traction and internal rotation until a palpable clunk was felt.  Following this there is subsequent leg lengths as well as good rotation about the hip.

## 2021-02-22 DIAGNOSIS — S73005A Unspecified dislocation of left hip, initial encounter: Secondary | ICD-10-CM | POA: Diagnosis not present

## 2021-02-22 NOTE — Progress Notes (Addendum)
CSW contact [facility] to inquire about hospital bed, pt does not qualify. CSW order Rolling walker to be deliver to pt's room.   CSW provided chose for [facility] providers , pt chose [place] spoke with [facility] with [facility] to confirm [facility] PT,OT, aide.       CSW spoke with pt and pt's daughter and informed them pt does not qualify for a Hospital bed. Pt and daughter is aware they will need to pay out of pocket. CSW provided [facility] contact information for hospital bed.  Pt's daughter is requesting EMS transfer home upon d/c.   Arlie Solomons.Randolf Sansoucie, MSW, [place]  Transitions of Care Clinical Social Worker I Direct Dial: [phone]  Fax: [phone] [email or web address]

## 2021-02-22 NOTE — Progress Notes (Incomplete Revision)
CSW contact [facility] to inquire about hospital bed, pt does not qualify. CSW order Rolling walker to be deliver to pt's room.   CSW provided choice for [facility] providers , pt chose [place] spoke with [facility] with [facility] to confirm [facility] PT,OT, aide.       CSW spoke with pt and pt's daughter and informed them pt does not qualify for a Hospital bed. Pt and daughter is aware they will need to pay out of pocket. CSW provided [facility] contact information for hospital bed. Pt's daughter is requesting EMS transfer home upon d/c.   Arlie Solomons.Jaquille Kau, MSW, [place]   Transitions of Care Clinical Social Worker I Direct Dial: [phone]   Fax: [phone] [email or web address]

## 2021-02-22 NOTE — Progress Notes (Signed)
CSW spoke with pt's daughter, she stated hospital bed was not deliverer yet.CSW to follow up with ADPT.  Arlie Solomons.Caydyn Sprung, MSW, [place]  Transitions of Care Clinical Social Worker I Direct Dial: [phone]  Fax: [phone] [email or web address]

## 2021-02-22 NOTE — Evaluation (Signed)
Physical Therapy Evaluation Patient Details Name: Brandi VIOLETTE MRN: [id number] DOB: 06-Dec-1955 Today's Date: 02/22/2021  History of Present Illness  65 yo female brought to [facility] after L THA dislcation. Reduced, now WBAT with KI.  PMH: bilateral THA and TKA'S, htn.  Clinical Impression  The patient  tolerated  mobilizing and ambulated x 10', short steps. KI in place and reviewed it's use and adjustment when slides down. Patient plans to return home. Recommend RW, hospital bed and  HHPT. Pt admitted with above diagnosis.  Pt currently with functional limitations due to the deficits listed below (see PT Problem List). Pt will benefit from skilled PT to increase their independence and safety with mobility to allow discharge to the venue listed below.          Recommendations for follow up therapy are one component of a multi-disciplinary discharge planning process, led by the attending physician.  Recommendations may be updated based on patient status, additional functional criteria and insurance authorization.  Follow Up Recommendations Home health PT    Assistance Recommended at Discharge Intermittent Supervision/Assistance  Functional Status Assessment Patient has had a recent decline in their functional status and demonstrates the ability to make significant improvements in function in a reasonable and predictable amount of time.  Equipment Recommendations  Rolling walker (2 wheels)    Recommendations for Other Services       Precautions / Restrictions Precautions Precautions: Posterior Hip Required Braces or Orthoses: Knee Immobilizer - Left Knee Immobilizer - Left: On at all times Restrictions Weight Bearing Restrictions: Yes LLE Weight Bearing: Weight bearing as tolerated      Mobility  Bed Mobility Overal bed mobility: Needs Assistance Bed Mobility: Supine to Sit;Sit to Supine     Supine to sit: Min assist Sit to supine: Min assist   General bed mobility comments:  support Lle and used stool to scoot self back onto stretcher due to height    Transfers Overall transfer level: Needs assistance Equipment used: Rolling walker (2 wheels) Transfers: Sit to/from Stand Sit to Stand: Min assist           General transfer comment: steady  assistance    Ambulation/Gait Ambulation/Gait assistance: Herbalist (Feet): 10 Feet Assistive device: Rolling walker (2 wheels) Gait Pattern/deviations: Step-to pattern Gait velocity: decr     General Gait Details: slow speed , step to. cues to turn to right when possible  Stairs            Wheelchair Mobility    Modified Rankin (Stroke Patients Only)       Balance Overall balance assessment: Needs assistance Sitting-balance support: No upper extremity supported;Feet supported Sitting balance-Leahy Scale: Good     Standing balance support: During functional activity;Bilateral upper extremity supported Standing balance-Leahy Scale: Fair                               Pertinent Vitals/Pain Pain Assessment: Faces Faces Pain Scale: Hurts a little bit Pain Location: LEFT HIP Pain Descriptors / Indicators: Discomfort Pain Intervention(s): Monitored during session    Home Living Family/patient expects to be discharged to:: Private residence Living Arrangements: Children Available Help at Discharge: Family;Available PRN/intermittently Type of Home: House Home Access: Stairs to enter   Entrance Stairs-Number of Steps: 2 Alternate Level Stairs-Number of Steps: 14 Home Layout: Two level;Bed/bath upstairs;1/2 bath on main level Home Equipment: Air cabin crew (4 wheels);BSC/3in1      Prior Function  Prior Level of Function : Independent/Modified Independent                     Hand Dominance        Extremity/Trunk Assessment   Upper Extremity Assessment Upper Extremity Assessment: Overall WFL for tasks assessed;RUE deficits/detail RUE Deficits /  Details: DECREASED  FLEXION, substitutes to  lift arm    Lower Extremity Assessment Lower Extremity Assessment: LLE deficits/detail LLE Deficits / Details: in KI, bears weight to amb.    Cervical / Trunk Assessment Cervical / Trunk Assessment: Normal  Communication   Communication: No difficulties  Cognition Arousal/Alertness: Awake/alert Behavior During Therapy: WFL for tasks assessed/performed Overall Cognitive Status: Within Functional Limits for tasks assessed                                          General Comments      Exercises     Assessment/Plan    PT Assessment Patient needs continued PT services  PT Problem List Decreased strength;Decreased knowledge of precautions;Decreased knowledge of use of DME;Decreased activity tolerance;Decreased safety awareness;Decreased range of motion       PT Treatment Interventions DME instruction;Therapeutic activities;Gait training;Therapeutic exercise;Patient/family education;Functional mobility training    PT Goals (Current goals can be found in the Care Plan section)  Acute Rehab PT Goals Patient Stated Goal: go home PT Goal Formulation: With patient Time For Goal Achievement: 03/08/21 Potential to Achieve Goals: Good    Frequency Min 3X/week   Barriers to discharge        Co-evaluation               AM-PAC PT "6 Clicks" Mobility  Outcome Measure Help needed turning from your back to your side while in a flat bed without using bedrails?: A Lot Help needed moving from lying on your back to sitting on the side of a flat bed without using bedrails?: A Lot Help needed moving to and from a bed to a chair (including a wheelchair)?: A Lot Help needed standing up from a chair using your arms (e.g., wheelchair or bedside chair)?: A Lot Help needed to walk in hospital room?: A Lot Help needed climbing 3-5 steps with a railing? : Total 6 Click Score: 11    End of Session Equipment Utilized During  Treatment: Gait belt Activity Tolerance: Patient tolerated treatment well Patient left: in bed;with call bell/phone within reach Nurse Communication: Mobility status PT Visit Diagnosis: Unsteadiness on feet (R26.81);Difficulty in walking, not elsewhere classified (R26.2);Pain Pain - Right/Left: Left Pain - part of body: Hip    Time: 1000-1030 PT Time Calculation (min) (ACUTE ONLY): 30 min   Charges:   PT Evaluation $PT Eval Low Complexity: 1 Low PT Treatments $Gait Training: 8-22 mins        Tresa Endo PT Acute Rehabilitation Services Pager [phone] Office [phone]   Claretha Cooper 02/22/2021, 10:52 AM

## 2021-02-23 ENCOUNTER — Encounter (INDEPENDENT_AMBULATORY_CARE_PROVIDER_SITE_OTHER): Payer: Self-pay | Admitting: Bariatrics

## 2021-02-23 ENCOUNTER — Telehealth: Payer: Self-pay

## 2021-02-23 ENCOUNTER — Telehealth (INDEPENDENT_AMBULATORY_CARE_PROVIDER_SITE_OTHER): Payer: Medicare Other | Admitting: Bariatrics

## 2021-02-23 DIAGNOSIS — E7849 Other hyperlipidemia: Secondary | ICD-10-CM | POA: Diagnosis not present

## 2021-02-23 DIAGNOSIS — R7303 Prediabetes: Secondary | ICD-10-CM | POA: Diagnosis not present

## 2021-02-23 DIAGNOSIS — Z6841 Body Mass Index (BMI) 40.0 and over, adult: Secondary | ICD-10-CM

## 2021-02-23 NOTE — Telephone Encounter (Signed)
The pt's daughter scheduled the pt an appt for a referral to orthopedic and ER follow-up.

## 2021-02-24 DIAGNOSIS — M810 Age-related osteoporosis without current pathological fracture: Secondary | ICD-10-CM | POA: Diagnosis not present

## 2021-02-24 DIAGNOSIS — Z6839 Body mass index (BMI) 39.0-39.9, adult: Secondary | ICD-10-CM | POA: Diagnosis not present

## 2021-02-24 DIAGNOSIS — I10 Essential (primary) hypertension: Secondary | ICD-10-CM | POA: Diagnosis not present

## 2021-02-24 DIAGNOSIS — K219 Gastro-esophageal reflux disease without esophagitis: Secondary | ICD-10-CM | POA: Diagnosis not present

## 2021-02-24 DIAGNOSIS — Z79891 Long term (current) use of opiate analgesic: Secondary | ICD-10-CM | POA: Diagnosis not present

## 2021-02-24 DIAGNOSIS — G4730 Sleep apnea, unspecified: Secondary | ICD-10-CM | POA: Diagnosis not present

## 2021-02-24 DIAGNOSIS — Z7951 Long term (current) use of inhaled steroids: Secondary | ICD-10-CM | POA: Diagnosis not present

## 2021-02-24 DIAGNOSIS — M5440 Lumbago with sciatica, unspecified side: Secondary | ICD-10-CM | POA: Diagnosis not present

## 2021-02-24 DIAGNOSIS — M1990 Unspecified osteoarthritis, unspecified site: Secondary | ICD-10-CM | POA: Diagnosis not present

## 2021-02-24 DIAGNOSIS — M069 Rheumatoid arthritis, unspecified: Secondary | ICD-10-CM | POA: Diagnosis not present

## 2021-02-24 DIAGNOSIS — G8929 Other chronic pain: Secondary | ICD-10-CM | POA: Diagnosis not present

## 2021-02-24 DIAGNOSIS — Z7985 Long-term (current) use of injectable non-insulin antidiabetic drugs: Secondary | ICD-10-CM | POA: Diagnosis not present

## 2021-02-24 DIAGNOSIS — S73035D Other anterior dislocation of left hip, subsequent encounter: Secondary | ICD-10-CM | POA: Diagnosis not present

## 2021-02-24 DIAGNOSIS — R7303 Prediabetes: Secondary | ICD-10-CM | POA: Diagnosis not present

## 2021-02-24 DIAGNOSIS — T84021D Dislocation of internal left hip prosthesis, subsequent encounter: Secondary | ICD-10-CM | POA: Diagnosis not present

## 2021-02-28 NOTE — Progress Notes (Signed)
TeleHealth Visit:  Due to the COVID-19 pandemic, this visit was completed with telemedicine (audio/video) technology to reduce patient and provider exposure as well as to preserve personal protective equipment.   Thy has verbally consented to this TeleHealth visit. The patient is located at home, the provider is located at the [facility] office. The participants in this visit include the listed provider and patient. The visit was conducted today via video.  Chief Complaint: OBESITY Brandi [name] is here to discuss her progress with her obesity treatment plan along with follow-up of her obesity related diagnoses. Brandi [name] is on the Category 1 Plan and states she is following her eating plan approximately 100% of the time. Brandi [name] states she is doing 0 minutes 0 times per week.  Today's visit was #: 9 Starting weight: 273 lbs Starting date: 08/25/2020  Interim History: Arilyn has dislocated her hip and needs a video visit. She thinks that she has lost 2-3 lbs. She is doing well with her water and protein intake.  Subjective:   1. Other hyperlipidemia Brandi [name] is currently taking Lipitor.   2. Pre-diabetes Brandi [name] is taking Ozempic currently. She states she has a normal appetite. She denies cravings.  Assessment/Plan:   1. Other hyperlipidemia Cardiovascular risk and specific lipid/LDL goals reviewed.  We discussed several lifestyle modifications today and Brandi [name] will continue to work on diet, exercise and weight loss efforts. Brandi [name] will continue Lipitor. She will decrease saturated fats. She will increase MUFAs and PUFAs. Orders and follow up as documented in patient record.   Counseling Intensive lifestyle modifications are the first line treatment for this issue. Dietary changes: Increase soluble fiber. Decrease simple carbohydrates. Exercise changes: Moderate to vigorous-intensity aerobic activity 150 minutes per week if tolerated. Lipid-lowering medications: see documented in  medical record.   2. Pre-diabetes Brandi [name] will continue taking Ozempic. She will continue to work on weight loss, exercise, and decreasing simple carbohydrates to help decrease the risk of diabetes.   3. Obesity, current BMI 38.8 Brandi [name] is currently in the action stage of change. As such, her goal is to continue with weight loss efforts. She has agreed to the Category 1 Plan.   Brandi [name] will continue staying close to the plan. She will keep her water and protein high.  Exercise goals:  Brandi [name] will continue physical therapy for now.  Behavioral modification strategies: increasing lean protein intake, decreasing simple carbohydrates, increasing vegetables, increasing water intake, decreasing eating out, no skipping meals, meal planning and cooking strategies, keeping healthy foods in the home, and planning for success.  Brandi [name] has agreed to follow-up with our clinic in 4 weeks with Brandi Marble, NP or Brandi [facility], Brandi [name] and 8 weeks with myself. She was informed of the importance of frequent follow-up visits to maximize her success with intensive lifestyle modifications for her multiple health conditions.  Objective:   VITALS: Per patient if applicable, see vitals. GENERAL: Alert and in no acute distress. CARDIOPULMONARY: No increased WOB. Speaking in clear sentences.  PSYCH: Pleasant and cooperative. Speech normal rate and rhythm. Affect is appropriate. Insight and judgement are appropriate. Attention is focused, linear, and appropriate.  NEURO: Oriented as arrived to appointment on time with no prompting.   Lab Results  Component Value Date   CREATININE 1.29 (H) 02/21/2021   BUN 28 (H) 02/21/2021   NA 135 02/21/2021   K 4.8 02/21/2021   CL 108 02/21/2021   CO2 23 02/21/2021   Lab Results  Component Value Date   ALT 14  10/09/2019   AST 18 10/09/2019   ALKPHOS 200 (H) 10/09/2019   BILITOT 0.4 10/09/2019   Lab Results  Component Value Date   HGBA1C 5.8 (H) 08/25/2020   HGBA1C 6.0 (H)  06/30/2020   HGBA1C 6.0 (H) 03/11/2020   HGBA1C 6.1 (H) 10/09/2019   HGBA1C 6.6 (H) 01/06/2019   Lab Results  Component Value Date   INSULIN 19.6 08/25/2020   Lab Results  Component Value Date   TSH 1.880 08/25/2020   Lab Results  Component Value Date   CHOL 123 08/25/2020   HDL 47 08/25/2020   LDLCALC 58 08/25/2020   TRIG 97 08/25/2020   CHOLHDL 2.6 03/11/2020   Lab Results  Component Value Date   VD25OH 105.0 (H) 08/25/2020   Lab Results  Component Value Date   WBC 8.7 02/21/2021   HGB 13.0 02/21/2021   HCT 38.8 02/21/2021   MCV 93.5 02/21/2021   PLT 270 02/21/2021   No results found for: IRON, TIBC, FERRITIN  Attestation Statements:   Reviewed by clinician on day of visit: allergies, medications, problem list, medical history, surgical history, family history, social history, and previous encounter notes.  I, [name], RMA, am acting as Location manager for [employer], DO.   I have reviewed the above documentation for accuracy and completeness, and I agree with the above. -

## 2021-03-01 ENCOUNTER — Telehealth: Payer: Self-pay | Admitting: Orthopaedic Surgery

## 2021-03-01 ENCOUNTER — Encounter: Payer: Self-pay | Admitting: Nurse Practitioner

## 2021-03-01 ENCOUNTER — Telehealth (INDEPENDENT_AMBULATORY_CARE_PROVIDER_SITE_OTHER): Payer: Medicare Other | Admitting: Nurse Practitioner

## 2021-03-01 ENCOUNTER — Encounter: Payer: Self-pay | Admitting: Pharmacist

## 2021-03-01 VITALS — BP 118/78 | Ht 64.0 in | Wt 226.0 lb

## 2021-03-01 DIAGNOSIS — S73005A Unspecified dislocation of left hip, initial encounter: Secondary | ICD-10-CM

## 2021-03-01 NOTE — Telephone Encounter (Signed)
Spoke to Brandi [name] and informed her pt does not need immobilizer and she should follow anterior hip approach

## 2021-03-01 NOTE — Telephone Encounter (Signed)
LMOM for [employer]

## 2021-03-01 NOTE — Progress Notes (Signed)
Virtual Visit via MyChart   This visit type was conducted due to national recommendations for restrictions regarding the COVID-19 Pandemic (e.g. social distancing) in an effort to limit this patient's exposure and mitigate transmission in our community.  Due to her co-morbid illnesses, this patient is at least at moderate risk for complications without adequate follow up.  This format is felt to be most appropriate for this patient at this time.  All issues noted in this document were discussed and addressed.  A limited physical exam was performed with this format.    This visit type was conducted due to national recommendations for restrictions regarding the COVID-19 Pandemic (e.g. social distancing) in an effort to limit this patient's exposure and mitigate transmission in our community.  Patients identity confirmed using two different identifiers.  This format is felt to be most appropriate for this patient at this time.  All issues noted in this document were discussed and addressed.  No physical exam was performed (except for noted visual exam findings with Video Visits).    Date:  03/01/2021   ID:  Brandi [name], DOB 1955-08-05, MRN [id number]  Patient Location:  Home - spoke with Brandi [name] and Brandi [name]  Provider location:   Office    Chief Complaint:  left hip dislocation  History of Present Illness:    Brandi [name] is a 65 y.o. female who presents via video conferencing for a telehealth visit today.    The patient does not have symptoms concerning for COVID-19 infection (fever, chills, cough, or new shortness of breath).   When she was washingin the bathroom and went right and her left leg went left, she was able to slide down on to her shower chair. She had a dislocated left hip. She has bilateral hip and knee replacements, done approximately in 2009, 2010.    She fell the first part of November she did not go to the ER. When she was walking out the front door and  she fell in the hallway, was having problems on her right side and then her feet went straight from under her and fell to the left and injured her shoulder on down. She was sore but no other injuries.   She has PT/OT coming to the house. She had conscious sedation. She is using a walker and bedside commode. She is downstairs at her daughters house. She has an adduct pillow and knee immobilizer.   She has been sitting more.  She reports she is well hydrated with water. She has been taking flexeril as needed but does make her sleepy.      Past Medical History:  Diagnosis Date   Anemia    Arthritis    Back pain    Edema, lower extremity    Food allergy    GERD (gastroesophageal reflux disease)    Hypertension    Hypertensive crisis    Joint pain    Morbid obesity ([place])    Osteoporosis    Pre-diabetes    Rheumatoid arthritis ([place])    Sciatica neuralgia    Sleep apnea    Past Surgical History:  Procedure Laterality Date   ABDOMINAL HYSTERECTOMY     PARTIAL   CHOLECYSTECTOMY     PROTESTIC HIPS     PROTESTIC KNEE       Current Meds  Medication Sig   amLODipine (NORVASC) 5 MG tablet Take 1.5 tablets (7.5 mg total) by mouth daily. (Patient taking differently: Take 7.5 mg by  mouth at bedtime.)   atorvastatin (LIPITOR) 40 MG tablet TAKE 1 TABLET BY MOUTH EVERY DAY (Patient taking differently: Take 40 mg by mouth every morning.)   Biotin 1000 MCG tablet Take 1,000 mcg by mouth every morning.   cetirizine (ZYRTEC) 10 MG tablet Take 10 mg by mouth every morning.   Cyanocobalamin (VITAMIN B-12 PO) Take 1 drop by mouth daily.   cyclobenzaprine (FLEXERIL) 10 MG tablet TAKE 1 TABLET(10 MG) BY MOUTH THREE TIMES DAILY (Patient taking differently: Take 10 mg by mouth 3 (three) times daily.)   fluticasone (FLONASE) 50 MCG/ACT nasal spray 1 spray daily as needed for allergies or rhinitis.   Magnesium 500 MG TABS Take 500 mg by mouth at bedtime.   Nebivolol HCl (BYSTOLIC) 20 MG TABS Take 1 tablet  by mouth once daily (Patient taking differently: Take 20 mg by mouth every morning.)   omeprazole (PRILOSEC OTC) 20 MG tablet Take 20 mg by mouth every morning.   omeprazole (PRILOSEC) 20 MG capsule Take 20 mg by mouth daily.   OVER THE COUNTER MEDICATION Take 30 mg by mouth 2 (two) times daily. Strengtia 30 mg   OVER THE COUNTER MEDICATION Take 1 tablet by mouth 2 (two) times daily. Hepato - C1   OVER THE COUNTER MEDICATION Take 1 capsule by mouth 2 (two) times daily. Omega CO3-SE   rOPINIRole (REQUIP) 2 MG tablet Take 1 tablet by mouth 2 (two) times daily.   Semaglutide, 1 MG/DOSE, (OZEMPIC, 1 MG/DOSE,) 4 MG/3ML SOPN Inject 1 mg into the skin once a week. (Patient taking differently: Inject 1 mg into the skin every Tuesday.)   spironolactone (ALDACTONE) 50 MG tablet Take one tablet by mouth on Monday, Tuesday, Thursday and Friday. (Patient taking differently: Take 50 mg by mouth See admin instructions. Take one tablet  (50 mg) by mouth on Monday, Tuesday, Thursday and Friday.)   topiramate (TOPAMAX) 50 MG tablet Take 1 tablet (50 mg total) by mouth 2 (two) times daily.   traMADol (ULTRAM) 50 MG tablet Take 1 tablet (50 mg total) by mouth every 6 (six) hours as needed. (Patient taking differently: Take 50 mg by mouth every 6 (six) hours as needed (pain).)   valsartan (DIOVAN) 160 MG tablet Take 1 tablet (160 mg total) by mouth daily.   Vitamin E 180 MG CAPS Take 180 mg by mouth every morning.     Allergies:   Hydralazine, Meperidine hcl, Aliskiren, Aspirin, Juniper berries, Latex, Rofecoxib, Spironolactone, Chocolate, Furosemide, Oxycodone-acetaminophen, and Sulfa antibiotics   Social History   Tobacco Use   Smoking status: Never   Smokeless tobacco: Never  Vaping Use   Vaping Use: Never used  Substance Use Topics   Alcohol use: Not Currently   Drug use: Not Currently     Family Hx: The patient's family history includes Anxiety disorder in her father; Bipolar disorder in her father;  Cancer in her mother; Diabetes in her father; Eating disorder in her mother; Heart disease in her father and mother; High Cholesterol in her father; High blood pressure in her father; Hypertension in her mother and sister; Kidney disease in her father; Obesity in her mother; Schizophrenia in her father.  ROS:   Please see the history of present illness.    Review of Systems  Constitutional: Negative.   Respiratory: Negative.    Cardiovascular: Negative.   Musculoskeletal:  Positive for joint pain (left hip pain when she is walking).  Neurological: Negative.   Psychiatric/Behavioral: Negative.     All  other systems reviewed and are negative.   Labs/Other Tests and Data Reviewed:    Recent Labs: 08/25/2020: TSH 1.880 02/21/2021: BUN 28; Creatinine, Ser 1.29; Hemoglobin 13.0; Platelets 270; Potassium 4.8; Sodium 135   Recent Lipid Panel Lab Results  Component Value Date/Time   CHOL 123 08/25/2020 11:43 AM   TRIG 97 08/25/2020 11:43 AM   HDL 47 08/25/2020 11:43 AM   CHOLHDL 2.6 03/11/2020 03:05 PM   LDLCALC 58 08/25/2020 11:43 AM    Wt Readings from Last 3 Encounters:  03/01/21 226 lb (102.5 kg)  02/21/21 239 lb (108.4 kg)  02/02/21 226 lb (102.5 kg)     Exam:    Vital Signs:  BP 118/78   Ht 5\' 4"  (1.626 m)   Wt 226 lb (102.5 kg)   BMI 38.79 kg/m     Physical Exam Vitals reviewed.  Constitutional:      General: She is not in acute distress.    Appearance: Normal appearance.  Pulmonary:     Effort: Pulmonary effort is normal. No respiratory distress.  Neurological:     Mental Status: She is alert.    ASSESSMENT & PLAN:     1. Dislocation of left hip, initial encounter ([place]) Hip is back in place after conscious sedation at ER, continue with PT/OT. Continue with follow up with Orthopedic if needs a new referral to Dr. Mayer Camel return call  - Ambulatory referral to Orthopedic Surgery   COVID-19 Education: The signs and symptoms of COVID-19 were discussed with the  patient and how to seek care for testing (follow up with PCP or arrange E-visit).  The importance of social distancing was discussed today.  Patient Risk:   After full review of this patients clinical status, I feel that they are at least moderate risk at this time.  Time:   Today, I have spent 14.29 minutes/ seconds with the patient with telehealth technology discussing above diagnoses.     Medication Adjustments/Labs and Tests Ordered: Current medicines are reviewed at length with the patient today.  Concerns regarding medicines are outlined above.   Tests Ordered: Orders Placed This Encounter  Procedures   Ambulatory referral to Orthopedic Surgery    Medication Changes: No orders of the defined types were placed in this encounter.   Disposition:  Follow up prn  Signed, [name], FNP

## 2021-03-01 NOTE — Telephone Encounter (Signed)
Annette the Nurse called and would like the protocol on the pt is. Also she would like to know if she can take the immobilizer?   CB [phone]

## 2021-03-11 ENCOUNTER — Other Ambulatory Visit (HOSPITAL_BASED_OUTPATIENT_CLINIC_OR_DEPARTMENT_OTHER): Payer: Self-pay | Admitting: Orthopaedic Surgery

## 2021-03-11 ENCOUNTER — Ambulatory Visit (HOSPITAL_BASED_OUTPATIENT_CLINIC_OR_DEPARTMENT_OTHER)
Admission: RE | Admit: 2021-03-11 | Discharge: 2021-03-11 | Disposition: A | Discharge status: Home / Self Care | Payer: Medicare Other | Source: Ambulatory Visit | Attending: Orthopaedic Surgery | Admitting: Orthopaedic Surgery

## 2021-03-11 ENCOUNTER — Ambulatory Visit (INDEPENDENT_AMBULATORY_CARE_PROVIDER_SITE_OTHER): Payer: Federal, State, Local not specified - PPO | Admitting: Orthopaedic Surgery

## 2021-03-11 ENCOUNTER — Other Ambulatory Visit: Payer: Self-pay

## 2021-03-11 DIAGNOSIS — M25512 Pain in left shoulder: Secondary | ICD-10-CM | POA: Diagnosis not present

## 2021-03-11 DIAGNOSIS — S43005A Unspecified dislocation of left shoulder joint, initial encounter: Secondary | ICD-10-CM | POA: Diagnosis not present

## 2021-03-11 DIAGNOSIS — S73005A Unspecified dislocation of left hip, initial encounter: Secondary | ICD-10-CM

## 2021-03-11 DIAGNOSIS — M19012 Primary osteoarthritis, left shoulder: Secondary | ICD-10-CM

## 2021-03-11 NOTE — Progress Notes (Signed)
Chief Complaint: Left hip follow-up, left shoulder     History of Present Illness:    Brandi [name] is a 65 y.o. female presents 2 weeks following a left closed reduction of an anterior hip dislocation status post left total hip arthroplasty.  She is doing well from the standpoint.  She is work with physical therapy and strengthening back the hip with the assistance of a walker.  She has been following anterior hip precautions with her therapist.  Since the injury she notes that her left shoulder is also flared up as well.  This has been increasingly painful over the joint.  She has had quite limited range of motion for several years now involving the left shoulder although this is more recently become painful as she has been using a walker due to her lower extremity dislocation.  She is right-hand dominant    Surgical History:   None  PMH/PSH/Family History/Social History/Meds/Allergies:    Past Medical History:  Diagnosis Date   Anemia    Arthritis    Back pain    Edema, lower extremity    Food allergy    GERD (gastroesophageal reflux disease)    Hypertension    Hypertensive crisis    Joint pain    Morbid obesity ([place])    Osteoporosis    Pre-diabetes    Rheumatoid arthritis ([place])    Sciatica neuralgia    Sleep apnea    Past Surgical History:  Procedure Laterality Date   ABDOMINAL HYSTERECTOMY     PARTIAL   CHOLECYSTECTOMY     PROTESTIC HIPS     PROTESTIC KNEE     Social History   Socioeconomic History   Marital status: Single    Spouse name: Not on file   Number of children: Not on file   Years of education: Not on file   Highest education level: Not on file  Occupational History   Occupation: Retired Medical illustrator Nurseb  Tobacco Use   Smoking status: Never   Smokeless tobacco: Never  Vaping Use   Vaping Use: Never used  Substance and Sexual Activity   Alcohol use: Not Currently   Drug use: Not Currently   Sexual  activity: Not on file  Other Topics Concern   Not on file  Social History Narrative   Lives alone   Right handed   Drinks no caffeine   Social Determinants of Health   Financial Resource Strain: Low Risk    Difficulty of Paying Living Expenses: Not hard at all  Food Insecurity: No Food Insecurity   Worried About Charity fundraiser in the Last Year: Never true   Ran Out of Food in the Last Year: Never true  Transportation Needs: No Transportation Needs   Lack of Transportation (Medical): No   Lack of Transportation (Non-Medical): No  Physical Activity: Inactive   Days of Exercise per Week: 0 days   Minutes of Exercise per Session: 0 min  Stress: No Stress Concern Present   Feeling of Stress : Not at all  Social Connections: Not on file   Family History  Problem Relation Age of Onset   Hypertension Mother    Cancer Mother    Heart disease Mother    Eating disorder Mother    Obesity Mother    Kidney disease Father  Heart disease Father    Diabetes Father    High blood pressure Father    High Cholesterol Father    Anxiety disorder Father    Bipolar disorder Father    Schizophrenia Father    Hypertension Sister    Allergies  Allergen Reactions   Hydralazine Itching   Meperidine Hcl Rash and Other (See Comments)    hallucinations   Aliskiren Hives   Aspirin Nausea Only   Juniper Berries Hives   Latex Hives   Rofecoxib Diarrhea    Reaction to Vioxx   Spironolactone Other (See Comments)    High doses caused nausea and rash - may can tolerate low doses   Chocolate Nausea And Vomiting and Rash   Furosemide Other (See Comments)    Rash and extreme dehydration   Oxycodone-Acetaminophen Itching and Rash   Sulfa Antibiotics Rash   Current Outpatient Medications  Medication Sig Dispense Refill   amLODipine (NORVASC) 5 MG tablet Take 1.5 tablets (7.5 mg total) by mouth daily. (Patient taking differently: Take 7.5 mg by mouth at bedtime.) 135 tablet 3   atorvastatin  (LIPITOR) 40 MG tablet TAKE 1 TABLET BY MOUTH EVERY DAY (Patient taking differently: Take 40 mg by mouth every morning.) 90 tablet 3   Biotin 1000 MCG tablet Take 1,000 mcg by mouth every morning.     cetirizine (ZYRTEC) 10 MG tablet Take 10 mg by mouth every morning.     Cyanocobalamin (VITAMIN B-12 PO) Take 1 drop by mouth daily.     cyclobenzaprine (FLEXERIL) 10 MG tablet TAKE 1 TABLET(10 MG) BY MOUTH THREE TIMES DAILY (Patient taking differently: Take 10 mg by mouth 3 (three) times daily.) 270 tablet 1   fluticasone (FLONASE) 50 MCG/ACT nasal spray 1 spray daily as needed for allergies or rhinitis.     Magnesium 500 MG TABS Take 500 mg by mouth at bedtime.     Nebivolol HCl (BYSTOLIC) 20 MG TABS Take 1 tablet by mouth once daily (Patient taking differently: Take 20 mg by mouth every morning.) 90 tablet 1   omeprazole (PRILOSEC OTC) 20 MG tablet Take 20 mg by mouth every morning.     omeprazole (PRILOSEC) 20 MG capsule Take 20 mg by mouth daily.     OVER THE COUNTER MEDICATION Take 30 mg by mouth 2 (two) times daily. Strengtia 30 mg     OVER THE COUNTER MEDICATION Take 1 tablet by mouth 2 (two) times daily. Hepato - C1     OVER THE COUNTER MEDICATION Take 1 capsule by mouth 2 (two) times daily. Omega CO3-SE     rOPINIRole (REQUIP) 2 MG tablet Take 1 tablet by mouth 2 (two) times daily.     Semaglutide, 1 MG/DOSE, (OZEMPIC, 1 MG/DOSE,) 4 MG/3ML SOPN Inject 1 mg into the skin once a week. (Patient taking differently: Inject 1 mg into the skin every Tuesday.) 9 mL 1   spironolactone (ALDACTONE) 50 MG tablet Take one tablet by mouth on Monday, Tuesday, Thursday and Friday. (Patient taking differently: Take 50 mg by mouth See admin instructions. Take one tablet  (50 mg) by mouth on Monday, Tuesday, Thursday and Friday.) 90 tablet 3   topiramate (TOPAMAX) 50 MG tablet Take 1 tablet (50 mg total) by mouth 2 (two) times daily. 180 tablet 3   traMADol (ULTRAM) 50 MG tablet Take 1 tablet (50 mg total) by  mouth every 6 (six) hours as needed. (Patient taking differently: Take 50 mg by mouth every 6 (six) hours as needed (  pain).) 20 tablet 0   valsartan (DIOVAN) 160 MG tablet Take 1 tablet (160 mg total) by mouth daily. 90 tablet 3   Vitamin E 180 MG CAPS Take 180 mg by mouth every morning.     No current facility-administered medications for this visit.   No results found.  Review of Systems:   A ROS was performed including pertinent positives and negatives as documented in the HPI.  Physical Exam :   Constitutional: NAD and appears stated age Neurological: Alert and oriented Psych: Appropriate affect and cooperative There were no vitals taken for this visit.   Comprehensive Musculoskeletal Exam:    Musculoskeletal Exam    Inspection Right Left  Skin No atrophy or winging No atrophy or winging  Palpation    Tenderness None Glenohumeral  Range of Motion    Flexion (passive) 140 100  Flexion (active) 140 100  Abduction 135 90  ER at the side 20 0  Can reach behind back to Sacrum Sacrum  Strength     Full Limited due to pain  Special Tests    Pseudoparalytic No No  Neurologic    Fires PIN, radial, median, ulnar, musculocutaneous, axillary, suprascapular, long thoracic, and spinal accessory innervated muscles. No abnormal sensibility  Vascular/Lymphatic    Radial Pulse 2+ 2+  Cervical Exam    Patient has symmetric cervical range of motion with negative Spurling's test.  Special Test:      Imaging:   Xray (3 views left shoulder): End-stage glenohumeral osteoarthritis   I personally reviewed and interpreted the radiographs.   Assessment:   2-week status post left prosthetic hip dislocation which was closed reduced by myself in the emergency room.  She is doing very well from the standpoint.  I would like to continue to advance her with anterior hip protocol and allow her for strengthening as tolerated.  I do believe that at this point there is an unlikely chance of  recurrence.  I have advised that likely her left shoulder is hurting as result of using the walker in order to weight-bear.  This will likely improve when she is no longer relying on a walker.  I would like her to get back to using her cane as tolerated. Plan :    -She will see me back in 6 weeks for reassessment     I personally saw and evaluated the patient, and participated in the management and treatment plan.  Vanetta Mulders, MD Attending Physician, Orthopedic Surgery  This document was dictated using Dragon voice recognition software. A reasonable attempt at proof reading has been made to minimize errors.

## 2021-03-14 ENCOUNTER — Emergency Department (HOSPITAL_BASED_OUTPATIENT_CLINIC_OR_DEPARTMENT_OTHER)
Admission: EM | Admit: 2021-03-14 | Discharge: 2021-03-14 | Disposition: A | Discharge status: Home / Self Care | Payer: Medicare Other | Attending: Emergency Medicine | Admitting: Emergency Medicine

## 2021-03-14 ENCOUNTER — Emergency Department (HOSPITAL_BASED_OUTPATIENT_CLINIC_OR_DEPARTMENT_OTHER): Payer: Medicare Other

## 2021-03-14 ENCOUNTER — Emergency Department (HOSPITAL_BASED_OUTPATIENT_CLINIC_OR_DEPARTMENT_OTHER): Payer: Medicare Other | Admitting: Radiology

## 2021-03-14 ENCOUNTER — Telehealth: Payer: Self-pay | Admitting: Orthopaedic Surgery

## 2021-03-14 ENCOUNTER — Other Ambulatory Visit: Payer: Self-pay

## 2021-03-14 ENCOUNTER — Encounter (HOSPITAL_BASED_OUTPATIENT_CLINIC_OR_DEPARTMENT_OTHER): Payer: Self-pay | Admitting: Emergency Medicine

## 2021-03-14 DIAGNOSIS — S73035A Other anterior dislocation of left hip, initial encounter: Secondary | ICD-10-CM | POA: Diagnosis not present

## 2021-03-14 DIAGNOSIS — W01198A Fall on same level from slipping, tripping and stumbling with subsequent striking against other object, initial encounter: Secondary | ICD-10-CM | POA: Insufficient documentation

## 2021-03-14 DIAGNOSIS — Y92481 Parking lot as the place of occurrence of the external cause: Secondary | ICD-10-CM | POA: Diagnosis not present

## 2021-03-14 DIAGNOSIS — Z91040 Latex allergy status: Secondary | ICD-10-CM | POA: Insufficient documentation

## 2021-03-14 DIAGNOSIS — Z043 Encounter for examination and observation following other accident: Secondary | ICD-10-CM | POA: Diagnosis not present

## 2021-03-14 DIAGNOSIS — W19XXXA Unspecified fall, initial encounter: Secondary | ICD-10-CM

## 2021-03-14 DIAGNOSIS — S79912A Unspecified injury of left hip, initial encounter: Secondary | ICD-10-CM | POA: Diagnosis not present

## 2021-03-14 DIAGNOSIS — T84021A Dislocation of internal left hip prosthesis, initial encounter: Secondary | ICD-10-CM | POA: Diagnosis not present

## 2021-03-14 DIAGNOSIS — I10 Essential (primary) hypertension: Secondary | ICD-10-CM | POA: Diagnosis not present

## 2021-03-14 DIAGNOSIS — M25522 Pain in left elbow: Secondary | ICD-10-CM | POA: Diagnosis not present

## 2021-03-14 DIAGNOSIS — Z79899 Other long term (current) drug therapy: Secondary | ICD-10-CM | POA: Insufficient documentation

## 2021-03-14 DIAGNOSIS — S73005A Unspecified dislocation of left hip, initial encounter: Secondary | ICD-10-CM | POA: Diagnosis not present

## 2021-03-14 DIAGNOSIS — T148XXA Other injury of unspecified body region, initial encounter: Secondary | ICD-10-CM | POA: Diagnosis not present

## 2021-03-14 MED ORDER — PROPOFOL 10 MG/ML IV BOLUS
1.0000 mg/kg | Freq: Once | INTRAVENOUS | Status: AC
  Administered 2021-03-14: 21:00:00 99.8 mg via INTRAVENOUS

## 2021-03-14 MED ORDER — PROPOFOL 10 MG/ML IV BOLUS
0.5000 mg/kg | Freq: Once | INTRAVENOUS | Status: DC
  Filled 2021-03-14: qty 20

## 2021-03-14 MED ORDER — SODIUM CHLORIDE 0.9 % IV SOLN: INTRAVENOUS | Status: DC

## 2021-03-14 MED ORDER — PROPOFOL 10 MG/ML IV BOLUS
INTRAVENOUS | Status: AC | PRN
  Administered 2021-03-14: 99.8 mg via INTRAVENOUS

## 2021-03-14 MED ORDER — KETAMINE HCL 10 MG/ML IJ SOLN: 0.5000 mg/kg | Freq: Once | INTRAMUSCULAR | Status: DC

## 2021-03-14 NOTE — Discharge Instructions (Addendum)
-  Do not bend the operated hip past 90 degrees -Do not cross the midline of the body with operated leg (use hip abduction pillow) -Do not rotate the operated leg inward -In bed, toes and knee cap should point toward ceiling -No weight bearing to the left leg  See your orthopedic specialist  tomorrow at 2 pm for follow up.

## 2021-03-14 NOTE — Sedation Documentation (Signed)
Waiting on x ray to verify the reduction

## 2021-03-14 NOTE — ED Triage Notes (Signed)
Per [facility] pt from [facility] , pt had a fall , landed on left side , left elbow and left hip . No deformities . No loc no neck or back pain .   Hx hip reduction .

## 2021-03-14 NOTE — ED Notes (Signed)
Patient transported to X-ray 

## 2021-03-14 NOTE — ED Provider Notes (Signed)
[facility] EMERGENCY DEPT Provider Note   CSN: [id number] Arrival date & time: 03/14/21  1549     History No chief complaint on file.   Brandi [name] is a 65 y.o. female.  This is a 65 y.o. female with significant medical history as below, including back pain, obesity, RA, sciatica, multiple hip injuries who presents to the ED with complaint of fall.  Patient ports that she was ambulating in the parking lot of a shopping center, she was getting to her vehicle she lost her balance/footing and fell to the ground family member caught her halfway down and her head did not hit the ground.  She struck her left elbow and the left side of her hip.  Patient with minimal discomfort to her left elbow.  No pain with range of motion left elbow.  Does have significant pain to her left hip.  Unable to bear weight since the incident.  No numbness or tingling to lower extremities.  Denies injury or pain to either knee or ankle.  No pain to contralateral hip.  No abdominal pain, nausea, vomiting, chest pain, dyspnea.  No fevers or chills.  Felt normal prior to this event.  No LOC, no thinners  The history is provided by the patient and a relative. No language interpreter was used.      Past Medical History:  Diagnosis Date   Anemia    Arthritis    Back pain    Edema, lower extremity    Food allergy    GERD (gastroesophageal reflux disease)    Hypertension    Hypertensive crisis    Joint pain    Morbid obesity ([place])    Osteoporosis    Pre-diabetes    Rheumatoid arthritis ([place])    Sciatica neuralgia    Sleep apnea     Patient Active Problem List   Diagnosis Date Noted   Hip dislocation, left ([place])    Chronic pain 12/16/2020   Hyperlipidemia 12/16/2020   Low back pain 12/16/2020   Posttraumatic headache 12/16/2020   Prediabetes 10/09/2019   Essential hypertension 10/09/2019   Morbid obesity ([place]) 06/09/2019    Past Surgical History:  Procedure Laterality Date    ABDOMINAL HYSTERECTOMY     PARTIAL   CHOLECYSTECTOMY     PROTESTIC HIPS     PROTESTIC KNEE       OB History     Gravida  3   Para  2   Term      Preterm      AB      Living         SAB      IAB      Ectopic      Multiple      Live Births              Family History  Problem Relation Age of Onset   Hypertension Mother    Cancer Mother    Heart disease Mother    Eating disorder Mother    Obesity Mother    Kidney disease Father    Heart disease Father    Diabetes Father    High blood pressure Father    High Cholesterol Father    Anxiety disorder Father    Bipolar disorder Father    Schizophrenia Father    Hypertension Sister     Social History   Tobacco Use   Smoking status: Never   Smokeless tobacco: Never  Vaping Use  Vaping Use: Never used  Substance Use Topics   Alcohol use: Not Currently   Drug use: Not Currently    Home Medications Prior to Admission medications   Medication Sig Start Date End Date Taking? Authorizing Provider  amLODipine (NORVASC) 5 MG tablet Take 1.5 tablets (7.5 mg total) by mouth daily. Patient taking differently: Take 7.5 mg by mouth at bedtime. 11/04/20   Belva Crome, MD  atorvastatin (LIPITOR) 40 MG tablet TAKE 1 TABLET BY MOUTH EVERY DAY Patient taking differently: Take 40 mg by mouth every morning. 08/16/20   [name], FNP  Biotin 1000 MCG tablet Take 1,000 mcg by mouth every morning.    [provider]  cetirizine (ZYRTEC) 10 MG tablet Take 10 mg by mouth every morning.    [provider]  Cyanocobalamin (VITAMIN B-12 PO) Take 1 drop by mouth daily.    [provider]  cyclobenzaprine (FLEXERIL) 10 MG tablet TAKE 1 TABLET(10 MG) BY MOUTH THREE TIMES DAILY Patient taking differently: Take 10 mg by mouth 3 (three) times daily. 11/15/20   [name], FNP  fluticasone (FLONASE) 50 MCG/ACT nasal spray 1 spray daily as needed for allergies or rhinitis.    [provider]  Magnesium 500 MG TABS Take 500 mg by mouth at bedtime.    [provider]  Nebivolol HCl (BYSTOLIC) 20 MG TABS Take 1 tablet by mouth once daily Patient taking differently: Take 20 mg by mouth every morning. 12/31/20   Belva Crome, MD  omeprazole (PRILOSEC OTC) 20 MG tablet Take 20 mg by mouth every morning.    [provider]  omeprazole (PRILOSEC) 20 MG capsule Take 20 mg by mouth daily.    [provider]  OVER THE COUNTER MEDICATION Take 30 mg by mouth 2 (two) times daily. Strengtia 30 mg    [provider]  OVER THE COUNTER MEDICATION Take 1 tablet by mouth 2 (two) times daily. Hepato - C1    [provider]  OVER THE COUNTER MEDICATION Take 1 capsule by mouth 2 (two) times daily. Omega CO3-SE    [provider]  rOPINIRole (REQUIP) 2 MG tablet Take 1 tablet by mouth 2 (two) times daily.    [provider]  Semaglutide, 1 MG/DOSE, (OZEMPIC, 1 MG/DOSE,) 4 MG/3ML SOPN Inject 1 mg into the skin once a week. Patient taking differently: Inject 1 mg into the skin every Tuesday. 12/28/20   Belva Crome, MD  spironolactone (ALDACTONE) 50 MG tablet Take one tablet by mouth on Monday, Tuesday, Thursday and Friday. Patient taking differently: Take 50 mg by mouth See admin instructions. Take one tablet  (50 mg) by mouth on Monday, Tuesday, Thursday and Friday. 02/08/21   Belva Crome, MD  topiramate (TOPAMAX) 50 MG tablet Take 1 tablet (50 mg total) by mouth 2 (two) times daily. 06/24/20   Frann Rider, NP  traMADol (ULTRAM) 50 MG tablet Take 1 tablet (50 mg total) by mouth every 6 (six) hours as needed. Patient taking differently: Take 50 mg by mouth every 6 (six) hours as needed (pain). 11/02/20 11/02/21  [name], FNP  valsartan (DIOVAN) 160 MG tablet Take 1 tablet (160 mg total) by mouth daily. 11/04/20   Belva Crome, MD  Vitamin E 180 MG CAPS Take 180 mg by mouth every morning.    [provider]    Allergies     Hydralazine, Meperidine hcl, Aliskiren, Aspirin, Juniper berries, Latex, Rofecoxib, Spironolactone, Chocolate, Furosemide, Oxycodone-acetaminophen,  and Sulfa antibiotics  Review of Systems   Review of Systems  Constitutional:  Negative for activity change and fever.  HENT:  Negative for facial swelling and trouble swallowing.   Eyes:  Negative for discharge and redness.  Respiratory:  Negative for cough and shortness of breath.   Cardiovascular:  Negative for chest pain and palpitations.  Gastrointestinal:  Negative for abdominal pain and nausea.  Genitourinary:  Negative for dysuria and flank pain.  Musculoskeletal:  Positive for arthralgias and gait problem. Negative for back pain.  Skin:  Negative for pallor and rash.  Neurological:  Negative for syncope and headaches.   Physical Exam Updated Vital Signs BP 114/77    Pulse 71    Temp 97.9 F (36.6 C) (Oral)    Resp 16    Ht 5\' 3"  (1.6 m)    Wt 99.8 kg    SpO2 100%    BMI 38.97 kg/m   Physical Exam Vitals and nursing note reviewed.  Constitutional:      General: She is not in acute distress.    Appearance: Normal appearance. She is obese. She is not ill-appearing.  HENT:     Head: Normocephalic and atraumatic.     Right Ear: External ear normal.     Left Ear: External ear normal.     Nose: Nose normal.     Mouth/Throat:     Mouth: Mucous membranes are moist.  Eyes:     General: No scleral icterus.       Right eye: No discharge.        Left eye: No discharge.  Cardiovascular:     Rate and Rhythm: Normal rate and regular rhythm.     Pulses: Normal pulses.     Heart sounds: Normal heart sounds.  Pulmonary:     Effort: Pulmonary effort is normal. No respiratory distress.     Breath sounds: Normal breath sounds.  Abdominal:     General: Abdomen is flat.     Tenderness: There is no abdominal tenderness.  Musculoskeletal:        General: Normal range of motion.     Cervical back: Normal range of motion.     Right lower  leg: No edema.     Left lower leg: No edema.       Legs:     Comments: No pain to either knee with provocative testing.  No pain to either ankle proper testing.  2+ DP pulses equal and symmetric bilateral.  No significant lower extremity swelling no significant evidence of trauma or tissue destruction to left acetabulum location of significant discomfort.  Skin:    General: Skin is warm and dry.     Capillary Refill: Capillary refill takes less than 2 seconds.  Neurological:     Mental Status: She is alert.  Psychiatric:        Mood and Affect: Mood normal.        Behavior: Behavior normal.    ED Results / Procedures / Treatments   Labs (all labs ordered are listed, but only abnormal results are displayed) Labs Reviewed - No data to display  EKG None  Radiology DG Pelvis Portable  Result Date: 03/14/2021 CLINICAL DATA:  Postreduction EXAM: PORTABLE PELVIS 1-2 VIEWS COMPARISON:  Left hip radiographs from earlier today FINDINGS: Successful reduction of left hip dislocation with no evidence of residual malalignment at the hip joints on this single frontal view. Bilateral total hip arthroplasty with no evidence of hardware fracture or  loosening. No bony fracture or diastasis. Degenerative changes in the visualized lower lumbar spine. No suspicious focal osseous lesions. IMPRESSION: Successful reduction of left hip dislocation with no evidence of residual hip malalignment on this frontal view. No evidence of hardware complication. No bone fracture. Electronically Signed   By: Ilona Sorrel M.D.   On: 03/14/2021 19:53   DG Hip Unilat W or Wo Pelvis 2-3 Views Left  Result Date: 03/14/2021 CLINICAL DATA:  Recent fall with possible hip dislocation, initial encounter EXAM: DG HIP (WITH OR WITHOUT PELVIS) 2-3V LEFT COMPARISON:  02/21/2021 FINDINGS: There is been interval anterior and superior dislocation of the femoral component of the left hip prosthesis. No acute fracture is seen. IMPRESSION:  Dislocation of the femoral prosthesis from the acetabular component on the left. Electronically Signed   By: Inez Catalina M.D.   On: 03/14/2021 18:10    Procedures .Critical Care Performed by: Jeanell Sparrow, DO Authorized by: Jeanell Sparrow, DO   Critical care provider statement:    Critical care time (minutes):  34   Critical care time was exclusive of:  Separately billable procedures and treating other patients   Critical care was time spent personally by me on the following activities:  Development of treatment plan with patient or surrogate, discussions with consultants, evaluation of patient's response to treatment, examination of patient, ordering and review of laboratory studies, ordering and review of radiographic studies, ordering and performing treatments and interventions, pulse oximetry, re-evaluation of patient's condition and review of old charts .Sedation  Date/Time: 03/15/2021 3:41 PM Performed by: Jeanell Sparrow, DO Authorized by: Jeanell Sparrow, DO   Consent:    Consent obtained:  Written   Consent given by:  Patient   Risks discussed:  Prolonged sedation necessitating reversal, prolonged hypoxia resulting in organ damage and respiratory compromise necessitating ventilatory assistance and intubation   Alternatives discussed:  Analgesia without sedation and anxiolysis Universal protocol:    Procedure explained and questions answered to patient or proxy's satisfaction: yes     Relevant documents present and verified: yes     Imaging studies available: yes     Immediately prior to procedure, a time out was called: yes     Patient identity confirmed:  Verbally with patient and arm band Indications:    Procedure performed:  Dislocation reduction   Procedure necessitating sedation performed by:  Physician performing sedation Pre-sedation assessment:    Time since last food or drink:  6 hours   ASA classification: class 2 - patient with mild systemic disease     Mouth  opening:  3 or more finger widths   Thyromental distance:  3 finger widths   Mallampati score:  II - soft palate, uvula, fauces visible   Neck mobility: normal     Pre-sedation assessments completed and reviewed: pre-procedure airway patency not reviewed, pre-procedure cardiovascular function not reviewed, pre-procedure hydration status not reviewed, pre-procedure mental status not reviewed, pre-procedure nausea and vomiting status not reviewed, pre-procedure pain level not reviewed, pre-procedure respiratory function not reviewed and pre-procedure temperature not reviewed     Pre-sedation assessment completed:  03/14/2021 7:10 PM Immediate pre-procedure details:    Reassessment: Patient reassessed immediately prior to procedure     Reviewed: vital signs, relevant labs/tests and NPO status     Verified: bag valve mask available, emergency equipment available, intubation equipment available, IV patency confirmed, oxygen available and reversal medications available   Procedure details (see MAR for exact dosages):    Preoxygenation:  Nasal cannula   Sedation:  Propofol   Intended level of sedation: deep   Analgesia:  None   Intra-procedure monitoring:  Blood pressure monitoring, continuous capnometry, continuous pulse oximetry, frequent vital sign checks, frequent LOC assessments and cardiac monitor   Intra-procedure events: none     Total Provider sedation time (minutes):  14 Post-procedure details:    Post-sedation assessment completed:  03/14/2021 7:40 PM   Attendance: Constant attendance by certified staff until patient recovered     Recovery: Patient returned to pre-procedure baseline     Post-sedation assessments completed and reviewed: post-procedure airway patency not reviewed, post-procedure cardiovascular function not reviewed, post-procedure hydration status not reviewed, post-procedure mental status not reviewed, post-procedure nausea and vomiting status not reviewed, pain score not  reviewed, post-procedure respiratory function not reviewed and post-procedure temperature not reviewed     Patient is stable for discharge or admission: yes     Procedure completion:  Tolerated well, no immediate complications Reduction of dislocation  Date/Time: 03/14/2021 7:00 PM Performed by: Jeanell Sparrow, DO Authorized by: Jeanell Sparrow, DO  Consent: Verbal consent obtained. Written consent obtained. Risks and benefits: risks, benefits and alternatives were discussed Consent given by: patient Patient understanding: patient states understanding of the procedure being performed Patient consent: the patient's understanding of the procedure matches consent given Procedure consent: procedure consent matches procedure scheduled Relevant documents: relevant documents present and verified Imaging studies: imaging studies available Required items: required blood products, implants, devices, and special equipment available Patient identity confirmed: verbally with patient and arm band Time out: Immediately prior to procedure a "time out" was called to verify the correct patient, procedure, equipment, support staff and site/side marked as required. Local anesthesia used: no  Anesthesia: Local anesthesia used: no  Sedation: Patient sedated: yes Sedatives: propofol Sedation start date/time: 03/14/2021 7:13 PM Sedation end date/time: 03/14/2021 7:27 PM Vitals: Vital signs were monitored during sedation.  Patient tolerance: patient tolerated the procedure well with no immediate complications     Medications Ordered in ED Medications  propofol (DIPRIVAN) 10 mg/mL bolus/IV push 99.8 mg (99.8 mg Intravenous Push 03/14/21 2059)  propofol (DIPRIVAN) 10 mg/mL bolus/IV push (99.8 mg Intravenous Given 03/14/21 1917)    ED Course  I have reviewed the triage vital signs and the nursing notes.  Pertinent labs & imaging results that were available during my care of the patient were reviewed  by me and considered in my medical decision making (see chart for details).    MDM Rules/Calculators/A&P                          CC: Fall, hip pain  This patient complains of above; this involves an extensive number of treatment options and is a complaint that carries with it a high risk of complications and morbidity. Vital signs were reviewed. Serious etiologies considered.  Record review:   Previous records obtained and reviewed   Additional history obtained from relative  Work up as above, notable for:  Imaging results that were available during my care of the patient were reviewed by me and considered in my medical decision making.   I ordered imaging studies which included hip, femur left and I independently visualized and interpreted imaging which showed anterior/superior dislocation, no fx.   Pt will require urgent reduction of this dislocation. Also recommend sedation. Pt agreeable, consented for procedure. NPO. End tidal CO2 and O2 in place, RT and nursing at bedside, also PA to  assist with reduction. See procedure note. Procedures completed, successful reduction. Pain greatly improved, placed in knee immobilizer. Rpt XR obtained and shows successful reduction.   Pt placed in knee immobilizer, given strict precautions for home and return precautions. She has appointment with her Ortho tomorrow afternoon at 2pm. Given tramadol rx for home.   Given post sedation instructions. She has returned back to her baseline, will be driven home by family at bedside.     The patient improved significantly and was discharged in stable condition. Detailed discussions were had with the patient regarding current findings, and need for close f/u with PCP or on call doctor. The patient has been instructed to return immediately if the symptoms worsen in any way for re-evaluation. Patient verbalized understanding and is in agreement with current care plan. All questions answered prior to  discharge.         This chart was dictated using voice recognition software.  Despite best efforts to proofread,  errors can occur which can change the documentation meaning.    Final Clinical Impression(s) / ED Diagnoses Final diagnoses:  Anterior dislocation of left hip, initial encounter [facility])  Fall, initial encounter    Rx / DC Orders ED Discharge Orders          Ordered    traMADol (ULTRAM) 50 MG tablet  Every 6 hours PRN        Pending             Jeanell Sparrow, DO 03/15/21 1547

## 2021-03-14 NOTE — Telephone Encounter (Signed)
Patient is at ED. She fell at the store on the same left side. Would like her seen this week. Her daughter's number is [phone]

## 2021-03-15 ENCOUNTER — Encounter: Payer: Medicare Other | Admitting: Nurse Practitioner

## 2021-03-15 DIAGNOSIS — M25552 Pain in left hip: Secondary | ICD-10-CM | POA: Diagnosis not present

## 2021-03-16 ENCOUNTER — Encounter: Payer: Self-pay | Admitting: Pharmacist

## 2021-03-16 ENCOUNTER — Encounter (INDEPENDENT_AMBULATORY_CARE_PROVIDER_SITE_OTHER): Payer: Self-pay | Admitting: Bariatrics

## 2021-03-16 ENCOUNTER — Ambulatory Visit (INDEPENDENT_AMBULATORY_CARE_PROVIDER_SITE_OTHER): Payer: Medicare Other

## 2021-03-16 ENCOUNTER — Other Ambulatory Visit: Payer: Self-pay | Admitting: Nurse Practitioner

## 2021-03-16 VITALS — Ht 63.0 in | Wt 220.0 lb

## 2021-03-16 DIAGNOSIS — Z1211 Encounter for screening for malignant neoplasm of colon: Secondary | ICD-10-CM | POA: Diagnosis not present

## 2021-03-16 DIAGNOSIS — S73005D Unspecified dislocation of left hip, subsequent encounter: Secondary | ICD-10-CM

## 2021-03-16 DIAGNOSIS — Z0000 Encounter for general adult medical examination without abnormal findings: Secondary | ICD-10-CM | POA: Diagnosis not present

## 2021-03-16 NOTE — Progress Notes (Addendum)
I connected with Brandi [name] today by telephone and verified that I am speaking with the correct person using two identifiers. Location patient: home Location provider: work Persons participating in the virtual visit: Susen, Haskew LPN.   I discussed the limitations, risks, security and privacy concerns of performing an evaluation and management service by telephone and the availability of in person appointments. I also discussed with the patient that there may be a patient responsible charge related to this service. The patient expressed understanding and verbally consented to this telephonic visit.    Interactive audio and video telecommunications were attempted between this provider and patient, however failed, due to patient having technical difficulties OR patient did not have access to video capability.  We continued and completed visit with audio only.     Vital signs may be patient reported or missing.  Subjective:   Brandi [name] is a 65 y.o. female who presents for Medicare Annual (Subsequent) preventive examination.  Review of Systems     Cardiac Risk Factors include: advanced age (>19men, >54 women);dyslipidemia;hypertension;obesity (BMI >30kg/m2)     Objective:    Today's Vitals   03/16/21 1056  Weight: 220 lb (99.8 kg)  Height: 5\' 3"  (1.6 m)   Body mass index is 38.97 kg/m.  Advanced Directives 03/16/2021 03/14/2021 02/21/2021 02/21/2021 03/11/2020 03/11/2019 11/04/2018  Does Patient Have a Medical Advance Directive? Yes No Yes No No No No  Type of Paramedic of [place];Living will - [place];Living will - - - -  Does patient want to make changes to medical advance directive? - - No - Patient declined - - - -  Copy of [place] in Chart? No - copy requested - No - copy requested - - - -  Would patient like information on creating a medical advance directive? - - No - Patient declined  No - Patient declined No - Patient declined Yes (MAU/Ambulatory/Procedural Areas - Information given) Yes (MAU/Ambulatory/Procedural Areas - Information given)    Current Medications (verified) Outpatient Encounter Medications as of 03/16/2021  Medication Sig   amLODipine (NORVASC) 5 MG tablet Take 1.5 tablets (7.5 mg total) by mouth daily. (Patient taking differently: Take 7.5 mg by mouth at bedtime.)   atorvastatin (LIPITOR) 40 MG tablet TAKE 1 TABLET BY MOUTH EVERY DAY (Patient taking differently: Take 40 mg by mouth every morning.)   Biotin 1000 MCG tablet Take 1,000 mcg by mouth every morning.   cetirizine (ZYRTEC) 10 MG tablet Take 10 mg by mouth every morning.   Cyanocobalamin (VITAMIN B-12 PO) Take 1 drop by mouth daily.   cyclobenzaprine (FLEXERIL) 10 MG tablet TAKE 1 TABLET(10 MG) BY MOUTH THREE TIMES DAILY (Patient taking differently: Take 10 mg by mouth 3 (three) times daily.)   fluticasone (FLONASE) 50 MCG/ACT nasal spray 1 spray daily as needed for allergies or rhinitis.   Magnesium 500 MG TABS Take 500 mg by mouth at bedtime.   Nebivolol HCl (BYSTOLIC) 20 MG TABS Take 1 tablet by mouth once daily (Patient taking differently: Take 20 mg by mouth every morning.)   omeprazole (PRILOSEC OTC) 20 MG tablet Take 20 mg by mouth every morning.   OVER THE COUNTER MEDICATION Take 30 mg by mouth 2 (two) times daily. Strengtia 30 mg   OVER THE COUNTER MEDICATION Take 1 tablet by mouth 2 (two) times daily. Hepato - C1   OVER THE COUNTER MEDICATION Take 1 capsule by mouth 2 (two) times daily. Omega  CO3-SE   rOPINIRole (REQUIP) 2 MG tablet Take 1 tablet by mouth 2 (two) times daily.   Semaglutide, 1 MG/DOSE, (OZEMPIC, 1 MG/DOSE,) 4 MG/3ML SOPN Inject 1 mg into the skin once a week. (Patient taking differently: Inject 1 mg into the skin every Tuesday.)   spironolactone (ALDACTONE) 50 MG tablet Take one tablet by mouth on Monday, Tuesday, Thursday and Friday. (Patient taking differently: Take 50 mg  by mouth See admin instructions. Take one tablet  (50 mg) by mouth on Monday, Tuesday, Thursday and Friday.)   topiramate (TOPAMAX) 50 MG tablet Take 1 tablet (50 mg total) by mouth 2 (two) times daily.   traMADol (ULTRAM) 50 MG tablet Take 1 tablet (50 mg total) by mouth every 6 (six) hours as needed. (Patient taking differently: Take 50 mg by mouth every 6 (six) hours as needed (pain).)   valsartan (DIOVAN) 160 MG tablet Take 1 tablet (160 mg total) by mouth daily.   Vitamin E 180 MG CAPS Take 180 mg by mouth every morning.   omeprazole (PRILOSEC) 20 MG capsule Take 20 mg by mouth daily.   No facility-administered encounter medications on file as of 03/16/2021.    Allergies (verified) Hydralazine, Meperidine hcl, Aliskiren, Aspirin, Juniper berries, Latex, Rofecoxib, Spironolactone, Chocolate, Furosemide, Oxycodone-acetaminophen, and Sulfa antibiotics   History: Past Medical History:  Diagnosis Date   Anemia    Arthritis    Back pain    Edema, lower extremity    Food allergy    GERD (gastroesophageal reflux disease)    Hypertension    Hypertensive crisis    Joint pain    Morbid obesity ([place])    Osteoporosis    Pre-diabetes    Rheumatoid arthritis ([place])    Sciatica neuralgia    Sleep apnea    Past Surgical History:  Procedure Laterality Date   ABDOMINAL HYSTERECTOMY     PARTIAL   CHOLECYSTECTOMY     PROTESTIC HIPS     PROTESTIC KNEE     Family History  Problem Relation Age of Onset   Hypertension Mother    Cancer Mother    Heart disease Mother    Eating disorder Mother    Obesity Mother    Kidney disease Father    Heart disease Father    Diabetes Father    High blood pressure Father    High Cholesterol Father    Anxiety disorder Father    Bipolar disorder Father    Schizophrenia Father    Hypertension Sister    Social History   Socioeconomic History   Marital status: Single    Spouse name: Not on file   Number of children: Not on file   Years of  education: Not on file   Highest education level: Not on file  Occupational History   Occupation: Retired Medical illustrator Nurseb  Tobacco Use   Smoking status: Never   Smokeless tobacco: Never  Vaping Use   Vaping Use: Never used  Substance and Sexual Activity   Alcohol use: Not Currently   Drug use: Not Currently   Sexual activity: Not Currently  Other Topics Concern   Not on file  Social History Narrative   Lives alone   Right handed   Drinks no caffeine   Social Determinants of Health   Financial Resource Strain: Low Risk    Difficulty of Paying Living Expenses: Not hard at all  Food Insecurity: No Food Insecurity   Worried About Charity fundraiser in the Last Year: Never  true   Ran Out of Food in the Last Year: Never true  Transportation Needs: No Transportation Needs   Lack of Transportation (Medical): No   Lack of Transportation (Non-Medical): No  Physical Activity: Inactive   Days of Exercise per Week: 0 days   Minutes of Exercise per Session: 0 min  Stress: No Stress Concern Present   Feeling of Stress : Not at all  Social Connections: Not on file    Tobacco Counseling Counseling given: Not Answered   Clinical Intake:  Pre-visit preparation completed: Yes  Pain : No/denies pain     Nutritional Status: BMI > 30  Obese Nutritional Risks: None Diabetes: No  How often do you need to have someone help you when you read instructions, pamphlets, or other written materials from your doctor or pharmacy?: 1 - Never What is the last grade level you completed in school?: bachelors degree  Diabetic? no  Interpreter Needed?: No  Information entered by :: NAllen LPN   Activities of Daily Living In your present state of health, do you have any difficulty performing the following activities: 03/16/2021 02/21/2021  Hearing? N N  Vision? N N  Difficulty concentrating or making decisions? N N  Walking or climbing stairs? Y Y  Dressing or bathing? Y Y  Doing  errands, shopping? Y N  Preparing Food and eating ? Y -  Using the Toilet? Y -  In the past six months, have you accidently leaked urine? Y -  Do you have problems with loss of bowel control? N -  Managing your Medications? N -  Managing your Finances? N -  Housekeeping or managing your Housekeeping? Y -  Some recent data might be hidden    Patient Care Team: [name], FNP as PCP - General ([place]) Belva Crome, MD as PCP - Cardiology (Cardiology) Rex Kras, Claudette Stapler, RN as Case Manager  Indicate any recent Medical Services you may have received from other than [facility] providers in the past year (date may be approximate).     Assessment:   This is a routine wellness examination for M[place].  Hearing/Vision screen Vision Screening - Comments:: Regular eye exams,   Dietary issues and exercise activities discussed: Current Exercise Habits: The patient does not participate in regular exercise at present   Goals Addressed             This Visit's Progress    Patient Stated       03/16/2021, lose weight, get hip physically fit       Depression Screen PHQ 2/9 Scores 03/16/2021 08/25/2020 03/11/2020 06/09/2019 03/11/2019 01/06/2019 11/04/2018  PHQ - 2 Score 0 2 0 0 0 0 0  PHQ- 9 Score - 9 - - - - -    Fall Risk Fall Risk  03/16/2021 03/11/2020 06/09/2019 03/11/2019 01/06/2019  Falls in the past year? 1 1 1 1  0  Comment slipped on the floor, hip gave out o her slipped in socks - - -  Number falls in past yr: 1 1 0 0 -  Injury with Fall? 1 0 0 0 -  Comment dislocated hip - - - -  Risk for fall due to : Impaired balance/gait;Impaired mobility;Medication side effect;History of fall(s) Impaired mobility;Medication side effect - - -  Follow up Falls evaluation completed;Education provided;Falls prevention discussed Falls evaluation completed;Education provided;Falls prevention discussed - - -    FALL RISK PREVENTION PERTAINING TO THE HOME:  Any stairs in or around the  home? Yes  If so, are there any without handrails? No  Home free of loose throw rugs in walkways, pet beds, electrical cords, etc? Yes  Adequate lighting in your home to reduce risk of falls? Yes   ASSISTIVE DEVICES UTILIZED TO PREVENT FALLS:  Life alert? No  Use of a cane, walker or w/c? Yes  Grab bars in the bathroom? No  Shower chair or bench in shower? No  Elevated toilet seat or a handicapped toilet? Yes   TIMED UP AND GO:  Was the test performed? No .      Cognitive Function: MMSE - Mini Mental State Exam 03/11/2019 09/26/2018  Orientation to time 5 5  Orientation to Place 5 4  Registration 3 3  Attention/ Calculation 5 5  Recall 2 2  Language- name 2 objects 2 2  Language- repeat 1 1  Language- follow 3 step command 3 3  Language- read & follow direction 1 1  Write a sentence 1 1  Copy design 1 1  Total score 29 28     6CIT Screen 03/16/2021 03/11/2020 03/11/2019  What Year? 0 points 0 points 0 points  What month? 0 points 0 points 0 points  What time? 0 points 0 points 0 points  Count back from 20 0 points 0 points 0 points  Months in reverse 0 points 0 points 0 points  Repeat phrase 0 points 0 points 6 points  Total Score 0 0 6    Immunizations Immunization History  Administered Date(s) Administered   Influenza, High Dose Seasonal PF 01/06/2019   PFIZER(Purple Top)SARS-COV-2 Vaccination 06/06/2019, 06/30/2019, 01/21/2020   Pneumococcal Conjugate-13 11/12/2019    TDAP status: Due, Education has been provided regarding the importance of this vaccine. Advised may receive this vaccine at local pharmacy or Health Dept. Aware to provide a copy of the vaccination record if obtained from local pharmacy or Health Dept. Verbalized acceptance and understanding.  Flu Vaccine status: Up to date  Pneumococcal vaccine status: Due, Education has been provided regarding the importance of this vaccine. Advised may receive this vaccine at local pharmacy or Health Dept.  Aware to provide a copy of the vaccination record if obtained from local pharmacy or Health Dept. Verbalized acceptance and understanding.  Covid-19 vaccine status: Completed vaccines  Qualifies for Shingles Vaccine? Yes   Zostavax completed No   Shingrix Completed?: No.    Education has been provided regarding the importance of this vaccine. Patient has been advised to call insurance company to determine out of pocket expense if they have not yet received this vaccine. Advised may also receive vaccine at local pharmacy or Health Dept. Verbalized acceptance and understanding.  Screening Tests Health Maintenance  Topic Date Due   TETANUS/TDAP  Never done   Zoster Vaccines- Shingrix (1 of 2) Never done   COLONOSCOPY (Pts 45-85yrs Insurance coverage will need to be confirmed)  Never done   COVID-19 Vaccine (4 - Booster for Pfizer series) 03/17/2020   INFLUENZA VACCINE  10/25/2020   Pneumonia Vaccine 24+ Years old (2 - PPSV23 if available, else PCV20) 11/11/2020   MAMMOGRAM  10/06/2022   DEXA SCAN  Completed   Hepatitis C Screening  Completed   HPV VACCINES  Aged Out    Health Maintenance  Health Maintenance Due  Topic Date Due   TETANUS/TDAP  Never done   Zoster Vaccines- Shingrix (1 of 2) Never done   COLONOSCOPY (Pts 45-30yrs Insurance coverage will need to be confirmed)  Never done   COVID-19 Vaccine (  4 - Booster for [employer] series) 03/17/2020   INFLUENZA VACCINE  10/25/2020   Pneumonia Vaccine 7+ Years old (2 - PPSV23 if available, else PCV20) 11/11/2020    Colorectal cancer screening: cologuard ordered today  Mammogram status: Completed 10/05/2020. Repeat every year  Bone Density status: Completed 09/10/2019.   Lung Cancer Screening: (Low Dose CT Chest recommended if Age 58-80 years, 30 pack-year currently smoking OR have quit w/in 15years.) does not qualify.   Lung Cancer Screening Referral: no  Additional Screening:  Hepatitis C Screening: does qualify; Completed  01/06/2019  Vision Screening: Recommended annual ophthalmology exams for early detection of glaucoma and other disorders of the eye. Is the patient up to date with their annual eye exam?  Yes  Who is the provider or what is the name of the office in which the patient attends annual eye exams? Can't remember name If pt is not established with a provider, would they like to be referred to a provider to establish care? No .   Dental Screening: Recommended annual dental exams for proper oral hygiene  Community Resource Referral / Chronic Care Management: CRR required this visit?  No   CCM required this visit?  No      Plan:     I have personally reviewed and noted the following in the patients chart:   Medical and social history Use of alcohol, tobacco or illicit drugs  Current medications and supplements including opioid prescriptions.  Functional ability and status Nutritional status Physical activity Advanced directives List of other physicians Hospitalizations, surgeries, and ER visits in previous 12 months Vitals Screenings to include cognitive, depression, and falls Referrals and appointments  In addition, I have reviewed and discussed with patient certain preventive protocols, quality metrics, and best practice recommendations. A written personalized care plan for preventive services as well as general preventive health recommendations were provided to patient.     Kellie Simmering, LPN   25/00/3704   Nurse Notes: none

## 2021-03-16 NOTE — Patient Instructions (Signed)
Brandi [name] , Thank you for taking time to come for your Medicare Wellness Visit. I appreciate your ongoing commitment to your health goals. Please review the following plan we discussed and let me know if I can assist you in the future.   Screening recommendations/referrals: Colonoscopy: cologuard ordered today Mammogram: completed 10/05/2020 Bone Density: completed 09/10/2019 Recommended yearly ophthalmology/optometry visit for glaucoma screening and checkup Recommended yearly dental visit for hygiene and checkup  Vaccinations: Influenza vaccine: completed per patient Pneumococcal vaccine: due Tdap vaccine: due Shingles vaccine: discussed   Covid-19: 01/21/2020, 06/30/2019, 06/06/2019  Advanced directives: Please bring a copy of your POA (Power of Attorney) and/or Living Will to your next appointment.   Conditions/risks identified: none  Next appointment: Follow up in one year for your annual wellness visit    Preventive Care 65 Years and Older, Female Preventive care refers to lifestyle choices and visits with your health care provider that can promote health and wellness. What does preventive care include? A yearly physical exam. This is also called an annual well check. Dental exams once or twice a year. Routine eye exams. Ask your health care provider how often you should have your eyes checked. Personal lifestyle choices, including: Daily care of your teeth and gums. Regular physical activity. Eating a healthy diet. Avoiding tobacco and drug use. Limiting alcohol use. Practicing safe sex. Taking low-dose aspirin every day. Taking vitamin and mineral supplements as recommended by your health care provider. What happens during an annual well check? The services and screenings done by your health care provider during your annual well check will depend on your age, overall health, lifestyle risk factors, and family history of disease. Counseling  Your health care provider may  ask you questions about your: Alcohol use. Tobacco use. Drug use. Emotional well-being. Home and relationship well-being. Sexual activity. Eating habits. History of falls. Memory and ability to understand (cognition). Work and work Statistician. Reproductive health. Screening  You may have the following tests or measurements: Height, weight, and BMI. Blood pressure. Lipid and cholesterol levels. These may be checked every 5 years, or more frequently if you are over 49 years old. Skin check. Lung cancer screening. You may have this screening every year starting at age 64 if you have a 30-pack-year history of smoking and currently smoke or have quit within the past 15 years. Fecal occult blood test (FOBT) of the stool. You may have this test every year starting at age 15. Flexible sigmoidoscopy or colonoscopy. You may have a sigmoidoscopy every 5 years or a colonoscopy every 10 years starting at age 34. Hepatitis C blood test. Hepatitis B blood test. Sexually transmitted disease (STD) testing. Diabetes screening. This is done by checking your blood sugar (glucose) after you have not eaten for a while (fasting). You may have this done every 1-3 years. Bone density scan. This is done to screen for osteoporosis. You may have this done starting at age 19. Mammogram. This may be done every 1-2 years. Talk to your health care provider about how often you should have regular mammograms. Talk with your health care provider about your test results, treatment options, and if necessary, the need for more tests. Vaccines  Your health care provider may recommend certain vaccines, such as: Influenza vaccine. This is recommended every year. Tetanus, diphtheria, and acellular pertussis (Tdap, Td) vaccine. You may need a Td booster every 10 years. Zoster vaccine. You may need this after age 74. Pneumococcal 13-valent conjugate (PCV13) vaccine. One dose is recommended  after age 3. Pneumococcal  polysaccharide (PPSV23) vaccine. One dose is recommended after age 70. Talk to your health care provider about which screenings and vaccines you need and how often you need them. This information is not intended to replace advice given to you by your health care provider. Make sure you discuss any questions you have with your health care provider. Document Released: 04/09/2015 Document Revised: 12/01/2015 Document Reviewed: 01/12/2015 Elsevier Interactive Patient Education  2017 [place] Prevention in the Home Falls can cause injuries. They can happen to people of all ages. There are many things you can do to make your home safe and to help prevent falls. What can I do on the outside of my home? Regularly fix the edges of walkways and driveways and fix any cracks. Remove anything that might make you trip as you walk through a door, such as a raised step or threshold. Trim any bushes or trees on the path to your home. Use bright outdoor lighting. Clear any walking paths of anything that might make someone trip, such as rocks or tools. Regularly check to see if handrails are loose or broken. Make sure that both sides of any steps have handrails. Any raised decks and porches should have guardrails on the edges. Have any leaves, snow, or ice cleared regularly. Use sand or salt on walking paths during winter. Clean up any spills in your garage right away. This includes oil or grease spills. What can I do in the bathroom? Use night lights. Install grab bars by the toilet and in the tub and shower. Do not use towel bars as grab bars. Use non-skid mats or decals in the tub or shower. If you need to sit down in the shower, use a plastic, non-slip stool. Keep the floor dry. Clean up any water that spills on the floor as soon as it happens. Remove soap buildup in the tub or shower regularly. Attach bath mats securely with double-sided non-slip rug tape. Do not have throw rugs and other  things on the floor that can make you trip. What can I do in the bedroom? Use night lights. Make sure that you have a light by your bed that is easy to reach. Do not use any sheets or blankets that are too big for your bed. They should not hang down onto the floor. Have a firm chair that has side arms. You can use this for support while you get dressed. Do not have throw rugs and other things on the floor that can make you trip. What can I do in the kitchen? Clean up any spills right away. Avoid walking on wet floors. Keep items that you use a lot in easy-to-reach places. If you need to reach something above you, use a strong step stool that has a grab bar. Keep electrical cords out of the way. Do not use floor polish or wax that makes floors slippery. If you must use wax, use non-skid floor wax. Do not have throw rugs and other things on the floor that can make you trip. What can I do with my stairs? Do not leave any items on the stairs. Make sure that there are handrails on both sides of the stairs and use them. Fix handrails that are broken or loose. Make sure that handrails are as long as the stairways. Check any carpeting to make sure that it is firmly attached to the stairs. Fix any carpet that is loose or worn. Avoid having throw  rugs at the top or bottom of the stairs. If you do have throw rugs, attach them to the floor with carpet tape. Make sure that you have a light switch at the top of the stairs and the bottom of the stairs. If you do not have them, ask someone to add them for you. What else can I do to help prevent falls? Wear shoes that: Do not have high heels. Have rubber bottoms. Are comfortable and fit you well. Are closed at the toe. Do not wear sandals. If you use a stepladder: Make sure that it is fully opened. Do not climb a closed stepladder. Make sure that both sides of the stepladder are locked into place. Ask someone to hold it for you, if possible. Clearly  mark and make sure that you can see: Any grab bars or handrails. First and last steps. Where the edge of each step is. Use tools that help you move around (mobility aids) if they are needed. These include: Canes. Walkers. Scooters. Crutches. Turn on the lights when you go into a dark area. Replace any light bulbs as soon as they burn out. Set up your furniture so you have a clear path. Avoid moving your furniture around. If any of your floors are uneven, fix them. If there are any pets around you, be aware of where they are. Review your medicines with your doctor. Some medicines can make you feel dizzy. This can increase your chance of falling. Ask your doctor what other things that you can do to help prevent falls. This information is not intended to replace advice given to you by your health care provider. Make sure you discuss any questions you have with your health care provider. Document Released: 01/07/2009 Document Revised: 08/19/2015 Document Reviewed: 04/17/2014 Elsevier Interactive Patient Education  2017 [employer].

## 2021-03-17 ENCOUNTER — Telehealth (INDEPENDENT_AMBULATORY_CARE_PROVIDER_SITE_OTHER): Payer: Medicare Other | Admitting: Adult Health

## 2021-03-17 ENCOUNTER — Other Ambulatory Visit: Payer: Self-pay

## 2021-03-17 ENCOUNTER — Ambulatory Visit (INDEPENDENT_AMBULATORY_CARE_PROVIDER_SITE_OTHER): Payer: Medicare Other

## 2021-03-17 ENCOUNTER — Telehealth: Payer: Medicare Other

## 2021-03-17 ENCOUNTER — Encounter (INDEPENDENT_AMBULATORY_CARE_PROVIDER_SITE_OTHER): Payer: Self-pay | Admitting: Adult Health

## 2021-03-17 DIAGNOSIS — Z6841 Body Mass Index (BMI) 40.0 and over, adult: Secondary | ICD-10-CM

## 2021-03-17 DIAGNOSIS — Z7409 Other reduced mobility: Secondary | ICD-10-CM

## 2021-03-17 DIAGNOSIS — R7303 Prediabetes: Secondary | ICD-10-CM

## 2021-03-17 DIAGNOSIS — S73005D Unspecified dislocation of left hip, subsequent encounter: Secondary | ICD-10-CM

## 2021-03-17 DIAGNOSIS — G4730 Sleep apnea, unspecified: Secondary | ICD-10-CM

## 2021-03-17 DIAGNOSIS — I10 Essential (primary) hypertension: Secondary | ICD-10-CM

## 2021-03-17 DIAGNOSIS — Z79899 Other long term (current) drug therapy: Secondary | ICD-10-CM

## 2021-03-17 HISTORY — DX: Other reduced mobility: Z74.09

## 2021-03-17 NOTE — Progress Notes (Signed)
TeleHealth Visit:  Due to the COVID-19 pandemic, this visit was completed with telemedicine (audio/video) technology to reduce patient and provider exposure as well as to preserve personal protective equipment.   Brandi [name] has verbally consented to this TeleHealth visit. The patient is located at home, the provider is located at the [facility] office. The participants in this visit include the listed provider and patient. The visit was conducted today via MyChart video.  Chief Complaint: OBESITY Brandi [name] is here to discuss her progress with her obesity treatment plan along with follow-up of her obesity related diagnoses. Brandi [name] is on the Category 1 Plan and states she is following her eating plan approximately 75% of the time. Brandi [name] states she is not exercising at this time.  Today's visit was #: 10 Starting weight: 273 lbs Starting date: 08/25/2020  Interim History:  History of left anterior hip dislocation on 02/21/2021. She reports falling at [employer] this week while shopping and re-injured L hip. She is in a left knee immobilizer now, follow-up with orthopedic specialist next for proper brace fitting - estimated to wear for 3 months.  Patient requests call from our clinic next Thursday, 12/29, to schedule next follow-up.  Subjective:   1. Pre-diabetes She is on Ozempic 1 mg once weekly - injects on Tuesday. She denies mass in neck, dysphagia, dyspepsia, persistent hoarseness, or GI upset. Dr. Smith/Cardiology manages GLP-1 therapy.  2. Mobility impaired History of left anterior hip dislocation on 02/21/2021. She reports falling at [employer] this week while shopping and re-injured L hip. She is in a left knee immobilizer now, follow-up with orthopedic specialist next for proper brace fitting - estimated to wear for 3 months. She is at her daughter's home permanently. Use walker for ambulation. Pain control - ice and PRN Ultram 50 mg Q6H.  Assessment/Plan:   1.  Pre-diabetes Continue Ozempic 1 mg - managed by Cardiology/Dr. Tamala Julian.  2. Mobility impaired Follow-up with Orthopedics.   3. Obesity, current BMI 38.8  Brandi [name] is currently in the action stage of change. As such, her goal is to continue with weight loss efforts. She has agreed to the Category 1 Plan.   Exercise goals: No exercise has been prescribed at this time.  Behavioral modification strategies: increasing lean protein intake, decreasing simple carbohydrates, meal planning and cooking strategies, keeping healthy foods in the home, and planning for success.  Aerielle has agreed to follow-up with our clinic in 4 weeks. She was informed of the importance of frequent follow-up visits to maximize her success with intensive lifestyle modifications for her multiple health conditions.  Objective:   VITALS: Per patient if applicable, see vitals. GENERAL: Alert and in no acute distress. CARDIOPULMONARY: No increased WOB. Speaking in clear sentences.  PSYCH: Pleasant and cooperative. Speech normal rate and rhythm. Affect is appropriate. Insight and judgement are appropriate. Attention is focused, linear, and appropriate.  NEURO: Oriented as arrived to appointment on time with no prompting.   Lab Results  Component Value Date   CREATININE 1.29 (H) 02/21/2021   BUN 28 (H) 02/21/2021   NA 135 02/21/2021   K 4.8 02/21/2021   CL 108 02/21/2021   CO2 23 02/21/2021   Lab Results  Component Value Date   ALT 14 10/09/2019   AST 18 10/09/2019   ALKPHOS 200 (H) 10/09/2019   BILITOT 0.4 10/09/2019   Lab Results  Component Value Date   HGBA1C 5.8 (H) 08/25/2020   HGBA1C 6.0 (H) 06/30/2020   HGBA1C  6.0 (H) 03/11/2020   HGBA1C 6.1 (H) 10/09/2019   HGBA1C 6.6 (H) 01/06/2019   Lab Results  Component Value Date   INSULIN 19.6 08/25/2020   Lab Results  Component Value Date   TSH 1.880 08/25/2020   Lab Results  Component Value Date   CHOL 123 08/25/2020   HDL 47 08/25/2020   LDLCALC 58  08/25/2020   TRIG 97 08/25/2020   CHOLHDL 2.6 03/11/2020   Lab Results  Component Value Date   VD25OH 105.0 (H) 08/25/2020   Lab Results  Component Value Date   WBC 8.7 02/21/2021   HGB 13.0 02/21/2021   HCT 38.8 02/21/2021   MCV 93.5 02/21/2021   PLT 270 02/21/2021   Attestation Statements:   Reviewed by clinician on day of visit: allergies, medications, problem list, medical history, surgical history, family history, social history, and previous encounter notes.  Time spent on visit including pre-visit chart review and post-visit charting and care was 25 minutes.   I, Water quality scientist, CMA, am acting as Location manager for Mina Marble, NP.  I have reviewed the above documentation for accuracy and completeness, and I agree with the above. - Laty d. Eliodoro Gullett, NP-C

## 2021-03-17 NOTE — Chronic Care Management (AMB) (Signed)
Chronic Care Management   CCM RN Visit Note  03/17/2021 Name: Brandi [name] MRN: [id number] DOB: 1955/11/26  Subjective: Brandi [name] is a 65 y.o. year old female who is a primary care patient of [name], [place]. The care management team was consulted for assistance with disease management and care coordination needs.    Engaged with patient by telephone for follow up visit in response to provider referral for case management and/or care coordination services.   Consent to Services:  The patient was given information about Chronic Care Management services, agreed to services, and gave verbal consent prior to initiation of services.  Please see initial visit note for detailed documentation.   Patient agreed to services and verbal consent obtained.   Assessment: Review of patient past medical history, allergies, medications, health status, including review of consultants reports, laboratory and other test data, was performed as part of comprehensive evaluation and provision of chronic care management services.   SDOH (Social Determinants of Health) assessments and interventions performed:  Yes, no acute challenges   CCM Care Plan  Allergies  Allergen Reactions   Hydralazine Itching   Meperidine Hcl Rash and Other (See Comments)    hallucinations   Aliskiren Hives   Aspirin Nausea Only   Juniper Berries Hives   Latex Hives   Rofecoxib Diarrhea    Reaction to Vioxx   Spironolactone Other (See Comments)    High doses caused nausea and rash - may can tolerate low doses   Chocolate Nausea And Vomiting and Rash   Furosemide Other (See Comments)    Rash and extreme dehydration   Oxycodone-Acetaminophen Itching and Rash   Sulfa Antibiotics Rash    Outpatient Encounter Medications as of 03/17/2021  Medication Sig Note   amLODipine (NORVASC) 5 MG tablet Take 1.5 tablets (7.5 mg total) by mouth daily. (Patient taking differently: Take 7.5 mg by mouth at bedtime.)     atorvastatin (LIPITOR) 40 MG tablet TAKE 1 TABLET BY MOUTH EVERY DAY (Patient taking differently: Take 40 mg by mouth every morning.)    Biotin 1000 MCG tablet Take 1,000 mcg by mouth every morning.    cetirizine (ZYRTEC) 10 MG tablet Take 10 mg by mouth every morning.    Cyanocobalamin (VITAMIN B-12 PO) Take 1 drop by mouth daily.    cyclobenzaprine (FLEXERIL) 10 MG tablet TAKE 1 TABLET(10 MG) BY MOUTH THREE TIMES DAILY (Patient taking differently: Take 10 mg by mouth 3 (three) times daily.)    fluticasone (FLONASE) 50 MCG/ACT nasal spray 1 spray daily as needed for allergies or rhinitis.    Magnesium 500 MG TABS Take 500 mg by mouth at bedtime.    Nebivolol HCl (BYSTOLIC) 20 MG TABS Take 1 tablet by mouth once daily (Patient taking differently: Take 20 mg by mouth every morning.)    omeprazole (PRILOSEC OTC) 20 MG tablet Take 20 mg by mouth every morning.    omeprazole (PRILOSEC) 20 MG capsule Take 20 mg by mouth daily. 02/21/2021: See entry for OTC omeprazole   OVER THE COUNTER MEDICATION Take 30 mg by mouth 2 (two) times daily. Strengtia 30 mg    OVER THE COUNTER MEDICATION Take 1 tablet by mouth 2 (two) times daily. Hepato - C1    OVER THE COUNTER MEDICATION Take 1 capsule by mouth 2 (two) times daily. Omega CO3-SE    rOPINIRole (REQUIP) 2 MG tablet Take 1 tablet by mouth 2 (two) times daily.    Semaglutide, 1 MG/DOSE, (OZEMPIC, 1 MG/DOSE,)  4 MG/3ML SOPN Inject 1 mg into the skin once a week. (Patient taking differently: Inject 1 mg into the skin every Tuesday.)    spironolactone (ALDACTONE) 50 MG tablet Take one tablet by mouth on Monday, Tuesday, Thursday and Friday. (Patient taking differently: Take 50 mg by mouth See admin instructions. Take one tablet  (50 mg) by mouth on Monday, Tuesday, Thursday and Friday.)    topiramate (TOPAMAX) 50 MG tablet Take 1 tablet (50 mg total) by mouth 2 (two) times daily.    traMADol (ULTRAM) 50 MG tablet Take 1 tablet (50 mg total) by mouth every 6 (six)  hours as needed. (Patient taking differently: Take 50 mg by mouth every 6 (six) hours as needed (pain).)    valsartan (DIOVAN) 160 MG tablet Take 1 tablet (160 mg total) by mouth daily.    Vitamin E 180 MG CAPS Take 180 mg by mouth every morning.    No facility-administered encounter medications on file as of 03/17/2021.    Patient Active Problem List   Diagnosis Date Noted   Hip dislocation, left ([place])    Chronic pain 12/16/2020   Hyperlipidemia 12/16/2020   Low back pain 12/16/2020   Posttraumatic headache 12/16/2020   Prediabetes 10/09/2019   Essential hypertension 10/09/2019   Morbid obesity ([place]) 06/09/2019    Conditions to be addressed/monitored: HTN, sleep apnea, polypharmacy, prediabetes, Morbid Obesity, dislocation of left hip   Care Plan : RN Care Manager Plan of Care  Updates made by Lynne Logan, RN since 03/17/2021 12:00 AM     Problem: No plan established for management of chronic disease states (HTN, sleep apnea, polypharmacy, prediabetes, Morbid Obesity, dislocation of left hip)   Priority: High     Long-Range Goal: Establishment of plan of care for chronic disease management (HTN, sleep apnea, polypharmacy, prediabetes, Morbid Obesity, dislocation of left hip)   Start Date: 03/17/2021  Expected End Date: 03/17/2022  This Visit's Progress: On track  Priority: High  Note:   Current Barriers:  Knowledge Deficits related to plan of care for management of HTN, sleep apnea, polypharmacy, prediabetes, Morbid Obesity, dislocation of left hip   Chronic Disease Management support and education needs related to HTN, sleep apnea, polypharmacy, prediabetes, Morbid Obesity, dislocation of left hip    RNCM Clinical Goal(s):  Patient will verbalize basic understanding of  HTN, sleep apnea, polypharmacy, prediabetes, Morbid Obesity, dislocation of left hip  disease process and self health management plan as evidenced by patient will report having no disease exacerbations  related to her chronic disease states as listed above  take all medications exactly as prescribed and will call provider for medication related questions as evidenced by patient will report having no missed doses of her prescribed medications demonstrate Ongoing health management independence as evidenced by patient will report 100% adherence to her prescribed treatment plan  continue to work with RN Care Manager to address care management and care coordination needs related to  HTN, sleep apnea, polypharmacy, prediabetes, Morbid Obesity, dislocation of left hip  as evidenced by adherence to CM Team Scheduled appointments demonstrate ongoing self health care management ability   as evidenced by    through collaboration with RN Care manager, provider, and care team.   Interventions: 1:1 collaboration with primary care provider regarding development and update of comprehensive plan of care as evidenced by provider attestation and co-signature Inter-disciplinary care team collaboration (see longitudinal plan of care) Evaluation of current treatment plan related to  self management and patient's adherence  to plan as established by provider Completed successful outbound call with patient and daughter Lollie Marrow   Prediabetes Interventions:  (Status:  Condition stable.  Not addressed this visit.) Long Term Goal Assessed patient's understanding of A1c goal:  <5.7 Provided education to patient about basic DM disease process Reviewed medications with patient and discussed importance of medication adherence Counseled on importance of regular laboratory monitoring as prescribed Provided patient with written educational materials related to hypo and hyperglycemia and importance of correct treatment Review of patient status, including review of consultants reports, relevant laboratory and other test results, and medications completed Discussed plans with patient for ongoing care management follow up and provided  patient with direct contact information for care management team Lab Results  Component Value Date   HGBA1C 5.8 (H) 08/25/2020    Chronic Kidney Disease Interventions:  (Status:  Condition stable.  Not addressed this visit.) Long Term Goal Assessed the Patient understanding of chronic kidney disease    Reviewed prescribed diet increase daily water intake as directed Provided education on kidney disease progression    Discussed plans with patient for ongoing care management follow up and provided patient with direct contact information for care management team Last practice recorded BP readings:  BP Readings from Last 3 Encounters:  03/14/21 114/77  03/01/21 118/78  02/22/21 111/71  Most recent eGFR/CrCl:  Lab Results  Component Value Date   EGFR 47 (L) 02/03/2021    No components found for: CRCL  Hypertension Interventions:  (Status:  Condition stable.  Not addressed this visit.) Long Term Goal Last practice recorded BP readings:  BP Readings from Last 3 Encounters:  03/14/21 114/77  03/01/21 118/78  02/22/21 111/71  Most recent eGFR/CrCl:  Lab Results  Component Value Date   EGFR 47 (L) 02/03/2021    No components found for: CRCL  Evaluation of current treatment plan related to hypertension self management and patient's adherence to plan as established by provider Provided education to patient re: stroke prevention, s/s of heart attack and stroke Reviewed medications with patient and discussed importance of compliance Provided education on prescribed diet low Sodium Discussed plans with patient for ongoing care management follow up and provided patient with direct contact information for care management team    Dislocation of left hip:  (Status:  New goal.)  Short Term Goal Evaluation of current treatment plan related to  Dislocation of left hip , self-management and patient's adherence to plan as established by provider Determined patient experienced 2 recent ED visits on  02/21/21 & 03/14/21 for dx: anterior dislocation of left hip following 2 spontaneous falls occurring within the patient's home  Review of patient status, including review of consultant's reports, relevant laboratory and other test results, and medications completed Determined patient completed an Orthopedic follow up with Dr. Hal Morales with [facility] on 03/16/21 at which time he recommended patient will be fitted for a new L hip brace she will wear 24/7 except when bathing Assessed for current use of DME and or home safety concerns, patient is using her walker and is being very cautious Determined patient has in home PT/OT and a HHA at which time the PT/OT is on hold pending patient's follow up with Dr. Hal Morales scheduled for 03/22/21 Patient/daughter deny having any current home safety concerns or DME needs Educated on fall prevention tips and instructed patient to use her DME at all times and take her time when changing positions and or when ambulating  Discussed plans with patient for ongoing care  management follow up and provided patient with direct contact information for care management team  Patient Goals/Self-Care Activities: Take all medications as prescribed Attend all scheduled provider appointments Call pharmacy for medication refills 3-7 days in advance of running out of medications Perform all self care activities independently  Perform IADL's (shopping, preparing meals, housekeeping, managing finances) independently Call provider office for new concerns or questions  call doctor for signs and symptoms of high blood pressure take medications for blood pressure exactly as prescribed report new symptoms to your doctor  Follow Up Plan:  Telephone follow up appointment with care management team member scheduled for:  04/25/21      Plan:Telephone follow up appointment with care management team member scheduled for:  04/25/21  Barb Merino, RN, BSN, CCM Care Management  Coordinator [place] [facility]  Direct Phone: [phone]

## 2021-03-17 NOTE — Patient Instructions (Signed)
Visit Information  Thank you for taking time to visit with me today. Please don't hesitate to contact me if I can be of assistance to you before our next scheduled telephone appointment.  Following are the goals we discussed today:  (Copy and paste patient goals from clinical care plan here)  Our next appointment is by telephone on 04/25/21 at 1:20 PM  Please call the care guide team at [phone] if you need to cancel or reschedule your appointment.   If you are experiencing a Mental Health or [place] or need someone to talk to, please call 1-800-273-TALK (toll free, 24 hour hotline)   Patient verbalizes understanding of instructions provided today and agrees to view in [place].   Barb Merino, RN, BSN, CCM Care Management Coordinator [place] [facility]  Direct Phone: [phone]

## 2021-03-22 DIAGNOSIS — Z96642 Presence of left artificial hip joint: Secondary | ICD-10-CM | POA: Diagnosis not present

## 2021-03-24 DIAGNOSIS — Z1211 Encounter for screening for malignant neoplasm of colon: Secondary | ICD-10-CM | POA: Diagnosis not present

## 2021-03-26 DIAGNOSIS — R7303 Prediabetes: Secondary | ICD-10-CM

## 2021-03-26 DIAGNOSIS — Z7951 Long term (current) use of inhaled steroids: Secondary | ICD-10-CM | POA: Diagnosis not present

## 2021-03-26 DIAGNOSIS — M069 Rheumatoid arthritis, unspecified: Secondary | ICD-10-CM | POA: Diagnosis not present

## 2021-03-26 DIAGNOSIS — M1990 Unspecified osteoarthritis, unspecified site: Secondary | ICD-10-CM | POA: Diagnosis not present

## 2021-03-26 DIAGNOSIS — Z7985 Long-term (current) use of injectable non-insulin antidiabetic drugs: Secondary | ICD-10-CM | POA: Diagnosis not present

## 2021-03-26 DIAGNOSIS — K219 Gastro-esophageal reflux disease without esophagitis: Secondary | ICD-10-CM | POA: Diagnosis not present

## 2021-03-26 DIAGNOSIS — Z79891 Long term (current) use of opiate analgesic: Secondary | ICD-10-CM | POA: Diagnosis not present

## 2021-03-26 DIAGNOSIS — I10 Essential (primary) hypertension: Secondary | ICD-10-CM | POA: Diagnosis not present

## 2021-03-26 DIAGNOSIS — M810 Age-related osteoporosis without current pathological fracture: Secondary | ICD-10-CM | POA: Diagnosis not present

## 2021-03-26 DIAGNOSIS — G8929 Other chronic pain: Secondary | ICD-10-CM | POA: Diagnosis not present

## 2021-03-26 DIAGNOSIS — S73035D Other anterior dislocation of left hip, subsequent encounter: Secondary | ICD-10-CM | POA: Diagnosis not present

## 2021-03-26 DIAGNOSIS — T84021D Dislocation of internal left hip prosthesis, subsequent encounter: Secondary | ICD-10-CM | POA: Diagnosis not present

## 2021-03-26 DIAGNOSIS — M5440 Lumbago with sciatica, unspecified side: Secondary | ICD-10-CM | POA: Diagnosis not present

## 2021-03-26 DIAGNOSIS — G4730 Sleep apnea, unspecified: Secondary | ICD-10-CM | POA: Diagnosis not present

## 2021-03-26 DIAGNOSIS — Z6839 Body mass index (BMI) 39.0-39.9, adult: Secondary | ICD-10-CM | POA: Diagnosis not present

## 2021-03-30 ENCOUNTER — Encounter: Payer: Self-pay | Admitting: Nurse Practitioner

## 2021-03-30 NOTE — ED Notes (Addendum)
Pt discharged home , alert and oriented x 4 , VS WDL.  Pt on wheelchair , left with daughter [name]  . No pain , knee immobilizer applied by EMT.  Aldrede score 10  This is  a late entry

## 2021-03-31 ENCOUNTER — Ambulatory Visit (INDEPENDENT_AMBULATORY_CARE_PROVIDER_SITE_OTHER): Payer: Medicare Other | Admitting: Bariatrics

## 2021-03-31 ENCOUNTER — Encounter (INDEPENDENT_AMBULATORY_CARE_PROVIDER_SITE_OTHER): Payer: Self-pay

## 2021-04-01 LAB — COLOGUARD: COLOGUARD: NEGATIVE

## 2021-04-06 ENCOUNTER — Ambulatory Visit (INDEPENDENT_AMBULATORY_CARE_PROVIDER_SITE_OTHER): Payer: Medicare Other | Admitting: Bariatrics

## 2021-04-12 ENCOUNTER — Other Ambulatory Visit: Payer: Self-pay

## 2021-04-12 ENCOUNTER — Encounter (INDEPENDENT_AMBULATORY_CARE_PROVIDER_SITE_OTHER): Payer: Self-pay | Admitting: Bariatrics

## 2021-04-12 ENCOUNTER — Ambulatory Visit (INDEPENDENT_AMBULATORY_CARE_PROVIDER_SITE_OTHER): Payer: Medicare Other | Admitting: Bariatrics

## 2021-04-12 VITALS — BP 105/73 | HR 93 | Temp 97.9°F | Ht 64.0 in | Wt 222.0 lb

## 2021-04-12 DIAGNOSIS — I10 Essential (primary) hypertension: Secondary | ICD-10-CM

## 2021-04-12 DIAGNOSIS — M25552 Pain in left hip: Secondary | ICD-10-CM | POA: Diagnosis not present

## 2021-04-12 DIAGNOSIS — Z6838 Body mass index (BMI) 38.0-38.9, adult: Secondary | ICD-10-CM | POA: Diagnosis not present

## 2021-04-12 DIAGNOSIS — R7303 Prediabetes: Secondary | ICD-10-CM

## 2021-04-12 DIAGNOSIS — Z6841 Body Mass Index (BMI) 40.0 and over, adult: Secondary | ICD-10-CM

## 2021-04-12 NOTE — Progress Notes (Signed)
Chief Complaint:   OBESITY Brandi [name] is here to discuss her progress with her obesity treatment plan along with follow-up of her obesity related diagnoses. Brandi [name] is on the Category 1 Plan and states she is following her eating plan approximately 90% of the time. [name] states she is doing 0 minutes 0 times per week.  Today's visit was #: 10 Starting weight: 273 lbs Starting date: 08/25/2020 Today's weight: 222 lbs Today's date: 04/12/2021 Total lbs lost to date: 51 lbs Total lbs lost since last in-office visit: 4 lbs  Interim History: Brandi [name] is down an additional 4 lbs since her last visit. She is doing well with her water.  Subjective:   1. Essential hypertension Brandi [name] is currently taking Norvasc and Nebivolol. Her blood pressure is well controlled. Her blood pressure was 105/73 today.  2. Pre-diabetes Brandi [name] is taking Ozempic currently. She notes side effects with Ozempic.   Assessment/Plan:   1. Essential hypertension [name] will continue her medications. She is working on healthy weight loss and exercise to improve blood pressure control. We will watch for signs of hypotension as she continues her lifestyle modifications.  2. Pre-diabetes [name] will continue her medications. She will continue to work on weight loss, exercise, and decreasing simple carbohydrates to help decrease the risk of diabetes.   3. Obesity, current BMI 38.2 Brandi [name] is currently in the action stage of change. As such, her goal is to continue with weight loss efforts. She has agreed to the Category 1 Plan.   Brandi [name] will continue intentional eating and she will continue meal planning.   Exercise goals:  Brandi [name] will continue going to physical therapy.   Behavioral modification strategies: increasing lean protein intake, decreasing simple carbohydrates, increasing vegetables, increasing water intake, decreasing eating out, no skipping meals, meal planning and cooking strategies, keeping healthy foods in the home,  and planning for success.  Brandi [name] has agreed to follow-up with our clinic in 4 weeks. She was informed of the importance of frequent follow-up visits to maximize her success with intensive lifestyle modifications for her multiple health conditions.   Objective:   Blood pressure 105/73, pulse 93, temperature 97.9 F (36.6 C), height 5\' 4"  (1.626 m), weight 222 lb (100.7 kg), SpO2 99 %. Body mass index is 38.11 kg/m.  General: Cooperative, alert, well developed, in no acute distress. HEENT: Conjunctivae and lids unremarkable. Cardiovascular: Regular rhythm.  Lungs: Normal work of breathing. Neurologic: No focal deficits.   Lab Results  Component Value Date   CREATININE 1.29 (H) 02/21/2021   BUN 28 (H) 02/21/2021   NA 135 02/21/2021   K 4.8 02/21/2021   CL 108 02/21/2021   CO2 23 02/21/2021   Lab Results  Component Value Date   ALT 14 10/09/2019   AST 18 10/09/2019   ALKPHOS 200 (H) 10/09/2019   BILITOT 0.4 10/09/2019   Lab Results  Component Value Date   HGBA1C 5.8 (H) 08/25/2020   HGBA1C 6.0 (H) 06/30/2020   HGBA1C 6.0 (H) 03/11/2020   HGBA1C 6.1 (H) 10/09/2019   HGBA1C 6.6 (H) 01/06/2019   Lab Results  Component Value Date   INSULIN 19.6 08/25/2020   Lab Results  Component Value Date   TSH 1.880 08/25/2020   Lab Results  Component Value Date   CHOL 123 08/25/2020   HDL 47 08/25/2020   LDLCALC 58 08/25/2020   TRIG 97 08/25/2020   CHOLHDL 2.6 03/11/2020   Lab Results  Component Value Date   VD25OH 105.0 (H) 08/25/2020  Lab Results  Component Value Date   WBC 8.7 02/21/2021   HGB 13.0 02/21/2021   HCT 38.8 02/21/2021   MCV 93.5 02/21/2021   PLT 270 02/21/2021   No results found for: IRON, TIBC, FERRITIN  Attestation Statements:   Reviewed by clinician on day of visit: allergies, medications, problem list, medical history, surgical history, family history, social history, and previous encounter notes.  I, [name], RMA, am acting as  Location manager for [employer], DO.  I have reviewed the above documentation for accuracy and completeness, and I agree with the above. [name], DO

## 2021-04-13 ENCOUNTER — Encounter (INDEPENDENT_AMBULATORY_CARE_PROVIDER_SITE_OTHER): Payer: Self-pay | Admitting: Bariatrics

## 2021-04-16 ENCOUNTER — Encounter: Payer: Self-pay | Admitting: Pharmacist

## 2021-04-19 ENCOUNTER — Other Ambulatory Visit (HOSPITAL_BASED_OUTPATIENT_CLINIC_OR_DEPARTMENT_OTHER): Payer: Self-pay

## 2021-04-19 ENCOUNTER — Telehealth: Payer: Self-pay | Admitting: Interventional Cardiology

## 2021-04-19 DIAGNOSIS — M25652 Stiffness of left hip, not elsewhere classified: Secondary | ICD-10-CM | POA: Diagnosis not present

## 2021-04-19 DIAGNOSIS — M25352 Other instability, left hip: Secondary | ICD-10-CM | POA: Diagnosis not present

## 2021-04-19 DIAGNOSIS — R531 Weakness: Secondary | ICD-10-CM | POA: Diagnosis not present

## 2021-04-19 MED ORDER — OZEMPIC (2 MG/DOSE) 8 MG/3ML ~~LOC~~ SOPN
2.0000 mg | PEN_INJECTOR | SUBCUTANEOUS | 3 refills | Status: DC
  Filled 2021-04-19: qty 9, 84d supply, fill #0

## 2021-04-19 NOTE — Telephone Encounter (Signed)
° °  Pt c/o medication issue:  1. Name of Medication: Semaglutide, 1 MG/DOSE, (OZEMPIC, 1 MG/DOSE,) 4 MG/3ML SOPN  2. How are you currently taking this medication (dosage and times per day)? Inject 1 mg into the skin once a week.Patient taking differently: Inject 1 mg into the skin every Tuesday.  3. Are you having a reaction (difficulty breathing--STAT)?   4. What is your medication issue? [facility] is requesting to speak with pharmD regarding this med

## 2021-04-19 NOTE — Telephone Encounter (Signed)
Offered for pt to fill at [facility] who has in stock. [facility] is closest to her house. Rx sent for 2mg  - has been on 1 mg for awhile since her pharmacy couldn't get 2mg . She will increase to 2mg  now.

## 2021-04-22 ENCOUNTER — Encounter (HOSPITAL_BASED_OUTPATIENT_CLINIC_OR_DEPARTMENT_OTHER): Payer: Self-pay

## 2021-04-22 ENCOUNTER — Ambulatory Visit (HOSPITAL_BASED_OUTPATIENT_CLINIC_OR_DEPARTMENT_OTHER): Payer: Federal, State, Local not specified - PPO | Admitting: Orthopaedic Surgery

## 2021-04-25 ENCOUNTER — Ambulatory Visit (INDEPENDENT_AMBULATORY_CARE_PROVIDER_SITE_OTHER): Payer: Medicare Other

## 2021-04-25 ENCOUNTER — Telehealth: Payer: Medicare Other

## 2021-04-25 DIAGNOSIS — S73005D Unspecified dislocation of left hip, subsequent encounter: Secondary | ICD-10-CM

## 2021-04-25 DIAGNOSIS — Z79899 Other long term (current) drug therapy: Secondary | ICD-10-CM

## 2021-04-25 DIAGNOSIS — I10 Essential (primary) hypertension: Secondary | ICD-10-CM

## 2021-04-25 DIAGNOSIS — G4730 Sleep apnea, unspecified: Secondary | ICD-10-CM

## 2021-04-25 DIAGNOSIS — R7303 Prediabetes: Secondary | ICD-10-CM

## 2021-04-25 NOTE — Chronic Care Management (AMB) (Signed)
Chronic Care Management   CCM RN Visit Note  04/25/2021 Name: Brandi [name] MRN: [id number] DOB: 10-14-1955  Subjective: Brandi [name] is a 66 y.o. year old female who is a primary care patient of [name], [place]. The care management team was consulted for assistance with disease management and care coordination needs.    Engaged with patient by telephone for follow up visit in response to provider referral for case management and/or care coordination services.   Consent to Services:  The patient was given information about Chronic Care Management services, agreed to services, and gave verbal consent prior to initiation of services.  Please see initial visit note for detailed documentation.   Patient agreed to services and verbal consent obtained.   Assessment: Review of patient past medical history, allergies, medications, health status, including review of consultants reports, laboratory and other test data, was performed as part of comprehensive evaluation and provision of chronic care management services.   SDOH (Social Determinants of Health) assessments and interventions performed:  Yes, no acute needs  CCM Care Plan  Allergies  Allergen Reactions   Hydralazine Itching   Meperidine Hcl Rash and Other (See Comments)    hallucinations   Aliskiren Hives   Aspirin Nausea Only   Juniper Berries Hives   Latex Hives   Rofecoxib Diarrhea    Reaction to Vioxx   Spironolactone Other (See Comments)    High doses caused nausea and rash - may can tolerate low doses   Chocolate Nausea And Vomiting and Rash   Furosemide Other (See Comments)    Rash and extreme dehydration   Oxycodone-Acetaminophen Itching and Rash   Sulfa Antibiotics Rash    Outpatient Encounter Medications as of 04/25/2021  Medication Sig Note   amLODipine (NORVASC) 5 MG tablet Take 1.5 tablets (7.5 mg total) by mouth daily. (Patient taking differently: Take 7.5 mg by mouth at bedtime.)    atorvastatin  (LIPITOR) 40 MG tablet TAKE 1 TABLET BY MOUTH EVERY DAY (Patient taking differently: Take 40 mg by mouth every morning.)    Biotin 1000 MCG tablet Take 1,000 mcg by mouth every morning.    cetirizine (ZYRTEC) 10 MG tablet Take 10 mg by mouth every morning.    Cyanocobalamin (VITAMIN B-12 PO) Take 1 drop by mouth daily.    cyclobenzaprine (FLEXERIL) 10 MG tablet TAKE 1 TABLET(10 MG) BY MOUTH THREE TIMES DAILY (Patient taking differently: Take 10 mg by mouth 3 (three) times daily.)    fluticasone (FLONASE) 50 MCG/ACT nasal spray 1 spray daily as needed for allergies or rhinitis.    Magnesium 500 MG TABS Take 500 mg by mouth at bedtime.    Nebivolol HCl (BYSTOLIC) 20 MG TABS Take 1 tablet by mouth once daily (Patient taking differently: Take 20 mg by mouth every morning.)    omeprazole (PRILOSEC OTC) 20 MG tablet Take 20 mg by mouth every morning.    omeprazole (PRILOSEC) 20 MG capsule Take 20 mg by mouth daily. 02/21/2021: See entry for OTC omeprazole   OVER THE COUNTER MEDICATION Take 30 mg by mouth 2 (two) times daily. Strengtia 30 mg    OVER THE COUNTER MEDICATION Take 1 tablet by mouth 2 (two) times daily. Hepato - C1    OVER THE COUNTER MEDICATION Take 1 capsule by mouth 2 (two) times daily. Omega CO3-SE    rOPINIRole (REQUIP) 2 MG tablet Take 1 tablet by mouth 2 (two) times daily.    Semaglutide, 2 MG/DOSE, (OZEMPIC, 2 MG/DOSE,) 8 MG/3ML  SOPN Inject 2 mg into the skin once a week.    spironolactone (ALDACTONE) 50 MG tablet Take one tablet by mouth on Monday, Tuesday, Thursday and Friday. (Patient taking differently: Take 50 mg by mouth See admin instructions. Take one tablet  (50 mg) by mouth on Monday, Tuesday, Thursday and Friday.)    topiramate (TOPAMAX) 50 MG tablet Take 1 tablet (50 mg total) by mouth 2 (two) times daily.    traMADol (ULTRAM) 50 MG tablet Take 1 tablet (50 mg total) by mouth every 6 (six) hours as needed. (Patient taking differently: Take 50 mg by mouth every 6 (six) hours  as needed (pain).)    valsartan (DIOVAN) 160 MG tablet Take 1 tablet (160 mg total) by mouth daily.    Vitamin E 180 MG CAPS Take 180 mg by mouth every morning.    No facility-administered encounter medications on file as of 04/25/2021.    Patient Active Problem List   Diagnosis Date Noted   Mobility impaired 03/17/2021   Hip dislocation, left ([place])    Chronic pain 12/16/2020   Hyperlipidemia 12/16/2020   Low back pain 12/16/2020   Posttraumatic headache 12/16/2020   Prediabetes 10/09/2019   Essential hypertension 10/09/2019   Morbid obesity ([place]) 06/09/2019    Conditions to be addressed/monitored: HTN, sleep apnea, polypharmacy, prediabetes, Morbid Obesity, dislocation of left hip   Care Plan : RN Care Manager Plan of Care  Updates made by Lynne Logan, RN since 04/25/2021 12:00 AM     Problem: No plan established for management of chronic disease states (HTN, sleep apnea, polypharmacy, prediabetes, Morbid Obesity, dislocation of left hip)   Priority: High     Long-Range Goal: Establishment of plan of care for chronic disease management (HTN, sleep apnea, polypharmacy, prediabetes, Morbid Obesity, dislocation of left hip)   Start Date: 03/17/2021  Expected End Date: 03/17/2022  Recent Progress: On track  Priority: High  Note:   Current Barriers:  Knowledge Deficits related to plan of care for management of HTN, sleep apnea, polypharmacy, prediabetes, Morbid Obesity, dislocation of left hip   Chronic Disease Management support and education needs related to HTN, sleep apnea, polypharmacy, prediabetes, Morbid Obesity, dislocation of left hip    RNCM Clinical Goal(s):  Patient will verbalize basic understanding of  HTN, sleep apnea, polypharmacy, prediabetes, Morbid Obesity, dislocation of left hip  disease process and self health management plan as evidenced by patient will report having no disease exacerbations related to her chronic disease states as listed above  take all  medications exactly as prescribed and will call provider for medication related questions as evidenced by patient will report having no missed doses of her prescribed medications demonstrate Ongoing health management independence as evidenced by patient will report 100% adherence to her prescribed treatment plan  continue to work with RN Care Manager to address care management and care coordination needs related to  HTN, sleep apnea, polypharmacy, prediabetes, Morbid Obesity, dislocation of left hip  as evidenced by adherence to CM Team Scheduled appointments demonstrate ongoing self health care management ability   as evidenced by    through collaboration with RN Care manager, provider, and care team.   Interventions: 1:1 collaboration with primary care provider regarding development and update of comprehensive plan of care as evidenced by provider attestation and co-signature Inter-disciplinary care team collaboration (see longitudinal plan of care) Evaluation of current treatment plan related to  self management and patient's adherence to plan as established by provider Completed successful outbound  call with patient and daughter Lollie Marrow   Prediabetes Interventions:  (Status:  Goal on track:  Yes.) Long Term Goal Assessed patient's understanding of A1c goal:  <5.7 Review of patient status, including review of consultants reports, relevant laboratory and other test results, and medications completed Reviewed medications with patient and discussed importance of medication adherence, discussed recent increase in Ozempic to 2 mg weekly, patient will start this dose tomorrow, 04/26/21 Educated on indication and common SE to be aware of  Counseled on importance of regular laboratory monitoring as prescribed Reviewed scheduled/upcoming provider appointments including: next PCP follow up appointment scheduled for 06/14/21 '@2' :40 PM  Discussed plans with patient for ongoing care management follow up and  provided patient with direct contact information for care management team Lab Results  Component Value Date   HGBA1C 5.8 (H) 08/25/2020    Chronic Kidney Disease Interventions:  (Status:  Condition stable.  Not addressed this visit.) Long Term Goal Assessed the Patient understanding of chronic kidney disease    Reviewed prescribed diet increase daily water intake as directed Provided education on kidney disease progression    Discussed plans with patient for ongoing care management follow up and provided patient with direct contact information for care management team Last practice recorded BP readings:  BP Readings from Last 3 Encounters:  03/14/21 114/77  03/01/21 118/78  02/22/21 111/71  Most recent eGFR/CrCl:  Lab Results  Component Value Date   EGFR 47 (L) 02/03/2021    No components found for: CRCL  Hypertension Interventions:  (Status:  Condition stable.  Not addressed this visit.) Long Term Goal Last practice recorded BP readings:  BP Readings from Last 3 Encounters:  03/14/21 114/77  03/01/21 118/78  02/22/21 111/71  Most recent eGFR/CrCl:  Lab Results  Component Value Date   EGFR 47 (L) 02/03/2021    No components found for: CRCL  Evaluation of current treatment plan related to hypertension self management and patient's adherence to plan as established by provider Provided education to patient re: stroke prevention, s/s of heart attack and stroke Reviewed medications with patient and discussed importance of compliance Provided education on prescribed diet low Sodium Discussed plans with patient for ongoing care management follow up and provided patient with direct contact information for care management team    Dislocation of left hip:  (Status:  Goal on track:  Yes.)  Long Term Goal Evaluation of current treatment plan related to  Dislocation of left hip , self-management and patient's adherence to plan as established by provider Assessed for falls since last RN CM  outreach, patient denies Assessed for current use of DME and or home safety concerns, patient is using her walker and is being very cautious Reinforced fall prevention tips and instructed patient to use her DME at all times and take her time when changing positions and or when ambulating  Confirmed patient received her new hip brace, she is working with outpatient PT twice weekly with good effectiveness  Discussed plans with patient for ongoing care management follow up and provided patient with direct contact information for care management team   Weight Loss Interventions:  (Status:  New goal.) Long Term Goal Evaluation of current treatment plan related to Weight Loss, self-management and patient's adherence to plan as established by provider Reviewed and discussed ongoing follow up with [facility] with most recent visit completed on 04/12/21 Today's visit was #: 10 Starting weight: 273 lbs Starting date: 08/25/2020 Today's weight: 222 lbs Today's date: 04/12/2021  Total lbs lost to date: 51 lbs Total lbs lost since last in-office visit: 4 lbs Review of patient status, including review of consultant's reports, relevant laboratory and other test results, and medications completed Praised patient for her weight loss of #51 lbs thus far and encouraged her to continue following her prescribed treatment plan  Reviewed medications with patient and discussed importance of medication adherence, reviewed increase dose of Ozempic to 2 mg weekly, patient will start tomorrow, 04/25/21; Educated on indication and common SE to be aware of  Discussed plans with patient for ongoing care management follow up and provided patient with direct contact information for care management team  Patient Goals/Self-Care Activities: Take all medications as prescribed Attend all scheduled provider appointments Call pharmacy for medication refills 3-7 days in advance of running out of medications Perform all  self care activities independently  Perform IADL's (shopping, preparing meals, housekeeping, managing finances) independently Call provider office for new concerns or questions  call doctor for signs and symptoms of high blood pressure take medications for blood pressure exactly as prescribed report new symptoms to your doctor  Follow Up Plan:  Telephone follow up appointment with care management team member scheduled for:  06/16/21      Plan:Telephone follow up appointment with care management team member scheduled for:  06/16/21  Barb Merino, RN, BSN, CCM Care Management Coordinator [place] [facility]  Direct Phone: [phone]

## 2021-04-25 NOTE — Patient Instructions (Signed)
Visit Information  Thank you for taking time to visit with me today. Please don't hesitate to contact me if I can be of assistance to you before our next scheduled telephone appointment.  Following are the goals we discussed today:  (Copy and paste patient goals from clinical care plan here)  Our next appointment is by telephone on 06/16/21 at 12 noon  Please call the care guide team at [phone] if you need to cancel or reschedule your appointment.   If you are experiencing a Mental Health or [place] or need someone to talk to, please call 1-800-273-TALK (toll free, 24 hour hotline)   Patient verbalizes understanding of instructions and care plan provided today and agrees to view in [place]. Active MyChart status confirmed with patient.    Barb Merino, RN, BSN, CCM Care Management Coordinator [place] [facility]  Direct Phone: [phone]

## 2021-04-26 DIAGNOSIS — I10 Essential (primary) hypertension: Secondary | ICD-10-CM

## 2021-04-26 DIAGNOSIS — R531 Weakness: Secondary | ICD-10-CM | POA: Diagnosis not present

## 2021-04-26 DIAGNOSIS — M25652 Stiffness of left hip, not elsewhere classified: Secondary | ICD-10-CM | POA: Diagnosis not present

## 2021-04-26 DIAGNOSIS — M25352 Other instability, left hip: Secondary | ICD-10-CM | POA: Diagnosis not present

## 2021-04-28 ENCOUNTER — Ambulatory Visit: Payer: Medicare Other

## 2021-04-28 DIAGNOSIS — R531 Weakness: Secondary | ICD-10-CM | POA: Diagnosis not present

## 2021-04-28 DIAGNOSIS — M25652 Stiffness of left hip, not elsewhere classified: Secondary | ICD-10-CM | POA: Diagnosis not present

## 2021-04-28 DIAGNOSIS — M25352 Other instability, left hip: Secondary | ICD-10-CM | POA: Diagnosis not present

## 2021-05-03 DIAGNOSIS — M25352 Other instability, left hip: Secondary | ICD-10-CM | POA: Diagnosis not present

## 2021-05-03 DIAGNOSIS — R531 Weakness: Secondary | ICD-10-CM | POA: Diagnosis not present

## 2021-05-03 DIAGNOSIS — M25652 Stiffness of left hip, not elsewhere classified: Secondary | ICD-10-CM | POA: Diagnosis not present

## 2021-05-03 NOTE — Addendum Note (Signed)
Addended by: [name] F on: 05/03/2021 09:04 AM   Modules accepted: Level of Service

## 2021-05-05 DIAGNOSIS — R531 Weakness: Secondary | ICD-10-CM | POA: Diagnosis not present

## 2021-05-05 DIAGNOSIS — M25352 Other instability, left hip: Secondary | ICD-10-CM | POA: Diagnosis not present

## 2021-05-05 DIAGNOSIS — M25652 Stiffness of left hip, not elsewhere classified: Secondary | ICD-10-CM | POA: Diagnosis not present

## 2021-05-09 ENCOUNTER — Encounter (INDEPENDENT_AMBULATORY_CARE_PROVIDER_SITE_OTHER): Payer: Self-pay | Admitting: Bariatrics

## 2021-05-09 ENCOUNTER — Encounter: Payer: Self-pay | Admitting: Nurse Practitioner

## 2021-05-09 ENCOUNTER — Other Ambulatory Visit: Payer: Self-pay

## 2021-05-09 ENCOUNTER — Ambulatory Visit (INDEPENDENT_AMBULATORY_CARE_PROVIDER_SITE_OTHER): Payer: Medicare Other | Admitting: Bariatrics

## 2021-05-09 VITALS — BP 122/81 | HR 82 | Temp 97.5°F | Ht 64.0 in | Wt 219.0 lb

## 2021-05-09 DIAGNOSIS — E669 Obesity, unspecified: Secondary | ICD-10-CM

## 2021-05-09 DIAGNOSIS — I10 Essential (primary) hypertension: Secondary | ICD-10-CM

## 2021-05-09 DIAGNOSIS — Z6838 Body mass index (BMI) 38.0-38.9, adult: Secondary | ICD-10-CM | POA: Diagnosis not present

## 2021-05-09 DIAGNOSIS — Z6841 Body Mass Index (BMI) 40.0 and over, adult: Secondary | ICD-10-CM

## 2021-05-09 DIAGNOSIS — E7849 Other hyperlipidemia: Secondary | ICD-10-CM

## 2021-05-09 NOTE — Progress Notes (Signed)
Chief Complaint:   OBESITY Jordane is here to discuss her progress with her obesity treatment plan along with follow-up of her obesity related diagnoses. Rupal is on the Category 1 Plan and states she is following her eating plan approximately 100% of the time. Vickki states she is doing physical therapy for 45 minutes 2 times per week.  Today's visit was #: 11 Starting weight: 273 lbs Starting date: 08/25/2020 Today's weight: 219 lbs Today's date: 05/09/2021 Total lbs lost to date: 54 lbs Total lbs lost since last in-office visit: 3 lbs  Interim History: Chaunice is down an additional 3 lbs and doing well overall. She is not hungry most of the time, but has some cravings.   Subjective:   1. Essential hypertension Vassie's blood pressure is controlled. Her last blood pressure was 105/73.  2. Other hyperlipidemia Morrisa is taking Lipitor currently.   Assessment/Plan:   1. Essential hypertension Corisa will continue her medications. She will have zero added salt. Amanpreet is working on healthy weight loss and exercise to improve blood pressure control. We will watch for signs of hypotension as she continues her lifestyle modifications.  2. Other hyperlipidemia Cardiovascular risk and specific lipid/LDL goals reviewed.  Taquilla will continue taking Lipitor. She will continue to decrease carbohydrates. She will increase MUFAs. We discussed several lifestyle modifications today and Mollyann will continue to work on diet, exercise and weight loss efforts. Orders and follow up as documented in patient record.   Counseling Intensive lifestyle modifications are the first line treatment for this issue. Dietary changes: Increase soluble fiber. Decrease simple carbohydrates. Exercise changes: Moderate to vigorous-intensity aerobic activity 150 minutes per week if tolerated. Lipid-lowering medications: see documented in medical record.  3. Obesity, current BMI 38.2 Karan is currently in the action stage  of change. As such, her goal is to continue with weight loss efforts. She has agreed to the Category 1 Plan.   Othella will continue meal planning and she will continue intentional eating. She will decrease calories and satisfy snacking. She will keep her water intake high.  Exercise goals:  As is.  Behavioral modification strategies: increasing lean protein intake, decreasing simple carbohydrates, increasing vegetables, increasing water intake, decreasing eating out, no skipping meals, meal planning and cooking strategies, keeping healthy foods in the home, and planning for success.  Marlana has agreed to follow-up with our clinic in 3-4 weeks. She was informed of the importance of frequent follow-up visits to maximize her success with intensive lifestyle modifications for her multiple health conditions.   Objective:   Blood pressure 122/81, pulse 82, temperature (!) 97.5 F (36.4 C), height 5\' 4"  (1.626 m), SpO2 100 %. Body mass index is 38.11 kg/m.  General: Cooperative, alert, well developed, in no acute distress. HEENT: Conjunctivae and lids unremarkable. Cardiovascular: Regular rhythm.  Lungs: Normal work of breathing. Neurologic: No focal deficits.   Lab Results  Component Value Date   CREATININE 1.29 (H) 02/21/2021   BUN 28 (H) 02/21/2021   NA 135 02/21/2021   K 4.8 02/21/2021   CL 108 02/21/2021   CO2 23 02/21/2021   Lab Results  Component Value Date   ALT 14 10/09/2019   AST 18 10/09/2019   ALKPHOS 200 (H) 10/09/2019   BILITOT 0.4 10/09/2019   Lab Results  Component Value Date   HGBA1C 5.8 (H) 08/25/2020   HGBA1C 6.0 (H) 06/30/2020   HGBA1C 6.0 (H) 03/11/2020   HGBA1C 6.1 (H) 10/09/2019   HGBA1C 6.6 (H) 01/06/2019  Lab Results  Component Value Date   INSULIN 19.6 08/25/2020   Lab Results  Component Value Date   TSH 1.880 08/25/2020   Lab Results  Component Value Date   CHOL 123 08/25/2020   HDL 47 08/25/2020   LDLCALC 58 08/25/2020   TRIG 97  08/25/2020   CHOLHDL 2.6 03/11/2020   Lab Results  Component Value Date   VD25OH 105.0 (H) 08/25/2020   Lab Results  Component Value Date   WBC 8.7 02/21/2021   HGB 13.0 02/21/2021   HCT 38.8 02/21/2021   MCV 93.5 02/21/2021   PLT 270 02/21/2021   No results found for: IRON, TIBC, FERRITIN  Attestation Statements:   Reviewed by clinician on day of visit: allergies, medications, problem list, medical history, surgical history, family history, social history, and previous encounter notes.  I, [name], RMA, am acting as Location manager for [employer], DO.  I have reviewed the above documentation for accuracy and completeness, and I agree with the above. [name], DO

## 2021-05-11 ENCOUNTER — Encounter (INDEPENDENT_AMBULATORY_CARE_PROVIDER_SITE_OTHER): Payer: Self-pay | Admitting: Bariatrics

## 2021-05-12 DIAGNOSIS — M25352 Other instability, left hip: Secondary | ICD-10-CM | POA: Diagnosis not present

## 2021-05-12 DIAGNOSIS — R531 Weakness: Secondary | ICD-10-CM | POA: Diagnosis not present

## 2021-05-12 DIAGNOSIS — M25652 Stiffness of left hip, not elsewhere classified: Secondary | ICD-10-CM | POA: Diagnosis not present

## 2021-05-16 ENCOUNTER — Other Ambulatory Visit: Payer: Self-pay

## 2021-05-16 MED ORDER — CYCLOBENZAPRINE HCL 10 MG PO TABS: ORAL_TABLET | ORAL | 1 refills | Status: DC

## 2021-05-17 DIAGNOSIS — M25652 Stiffness of left hip, not elsewhere classified: Secondary | ICD-10-CM | POA: Diagnosis not present

## 2021-05-17 DIAGNOSIS — M25352 Other instability, left hip: Secondary | ICD-10-CM | POA: Diagnosis not present

## 2021-05-17 DIAGNOSIS — R531 Weakness: Secondary | ICD-10-CM | POA: Diagnosis not present

## 2021-05-18 DIAGNOSIS — M25552 Pain in left hip: Secondary | ICD-10-CM | POA: Diagnosis not present

## 2021-05-18 DIAGNOSIS — M25551 Pain in right hip: Secondary | ICD-10-CM | POA: Diagnosis not present

## 2021-05-19 ENCOUNTER — Other Ambulatory Visit: Payer: Self-pay | Admitting: Orthopedic Surgery

## 2021-05-19 DIAGNOSIS — M25552 Pain in left hip: Secondary | ICD-10-CM

## 2021-05-19 DIAGNOSIS — M25352 Other instability, left hip: Secondary | ICD-10-CM | POA: Diagnosis not present

## 2021-05-19 DIAGNOSIS — R531 Weakness: Secondary | ICD-10-CM | POA: Diagnosis not present

## 2021-05-19 DIAGNOSIS — M25652 Stiffness of left hip, not elsewhere classified: Secondary | ICD-10-CM | POA: Diagnosis not present

## 2021-05-25 DIAGNOSIS — M25352 Other instability, left hip: Secondary | ICD-10-CM | POA: Diagnosis not present

## 2021-05-25 DIAGNOSIS — M25652 Stiffness of left hip, not elsewhere classified: Secondary | ICD-10-CM | POA: Diagnosis not present

## 2021-05-25 DIAGNOSIS — R531 Weakness: Secondary | ICD-10-CM | POA: Diagnosis not present

## 2021-05-31 ENCOUNTER — Ambulatory Visit (INDEPENDENT_AMBULATORY_CARE_PROVIDER_SITE_OTHER): Payer: Medicare Other | Admitting: Bariatrics

## 2021-05-31 DIAGNOSIS — M25352 Other instability, left hip: Secondary | ICD-10-CM | POA: Diagnosis not present

## 2021-05-31 DIAGNOSIS — R531 Weakness: Secondary | ICD-10-CM | POA: Diagnosis not present

## 2021-05-31 DIAGNOSIS — M25652 Stiffness of left hip, not elsewhere classified: Secondary | ICD-10-CM | POA: Diagnosis not present

## 2021-06-02 ENCOUNTER — Other Ambulatory Visit: Payer: Self-pay

## 2021-06-02 ENCOUNTER — Ambulatory Visit (INDEPENDENT_AMBULATORY_CARE_PROVIDER_SITE_OTHER): Payer: Medicare Other | Admitting: Bariatrics

## 2021-06-02 ENCOUNTER — Encounter (INDEPENDENT_AMBULATORY_CARE_PROVIDER_SITE_OTHER): Payer: Self-pay | Admitting: Bariatrics

## 2021-06-02 ENCOUNTER — Other Ambulatory Visit (HOSPITAL_BASED_OUTPATIENT_CLINIC_OR_DEPARTMENT_OTHER): Payer: Self-pay

## 2021-06-02 ENCOUNTER — Ambulatory Visit
Admission: RE | Admit: 2021-06-02 | Discharge: 2021-06-02 | Disposition: A | Discharge status: Home / Self Care | Payer: Medicare Other | Source: Ambulatory Visit | Attending: Orthopedic Surgery | Admitting: Orthopedic Surgery

## 2021-06-02 VITALS — BP 104/80 | HR 94 | Temp 97.8°F | Ht 64.0 in | Wt 219.0 lb

## 2021-06-02 DIAGNOSIS — R7303 Prediabetes: Secondary | ICD-10-CM | POA: Diagnosis not present

## 2021-06-02 DIAGNOSIS — Z6837 Body mass index (BMI) 37.0-37.9, adult: Secondary | ICD-10-CM

## 2021-06-02 DIAGNOSIS — M25552 Pain in left hip: Secondary | ICD-10-CM | POA: Diagnosis not present

## 2021-06-02 DIAGNOSIS — E669 Obesity, unspecified: Secondary | ICD-10-CM

## 2021-06-02 DIAGNOSIS — M25352 Other instability, left hip: Secondary | ICD-10-CM | POA: Diagnosis not present

## 2021-06-02 DIAGNOSIS — M25652 Stiffness of left hip, not elsewhere classified: Secondary | ICD-10-CM | POA: Diagnosis not present

## 2021-06-02 DIAGNOSIS — E7849 Other hyperlipidemia: Secondary | ICD-10-CM

## 2021-06-02 DIAGNOSIS — Z6841 Body Mass Index (BMI) 40.0 and over, adult: Secondary | ICD-10-CM

## 2021-06-02 DIAGNOSIS — R531 Weakness: Secondary | ICD-10-CM | POA: Diagnosis not present

## 2021-06-02 MED ORDER — OZEMPIC (2 MG/DOSE) 8 MG/3ML ~~LOC~~ SOPN
2.0000 mg | PEN_INJECTOR | SUBCUTANEOUS | 0 refills | Status: DC
  Filled 2021-06-02 – 2021-07-05 (×3): qty 9, 84d supply, fill #0

## 2021-06-06 ENCOUNTER — Encounter (INDEPENDENT_AMBULATORY_CARE_PROVIDER_SITE_OTHER): Payer: Self-pay | Admitting: Bariatrics

## 2021-06-06 NOTE — Progress Notes (Signed)
? ? ? ?Chief Complaint:  ? ?OBESITY ?Brandi [name] is here to discuss her progress with her obesity treatment plan along with follow-up of her obesity related diagnoses. Renleigh is on the Category 1 Plan and states she is following her eating plan approximately 100% of the time. Xoie states she is doing physical therapy for 45 minutes 1 times per week and walking for 5 minutes 7 times per week. ? ?Today's visit was #: 12 ?Starting weight: 273 lbs ?Starting date: 08/25/2020 ?Today's weight: 219 lbs ?Today's date: 06/02/2021 ?Total lbs lost to date: 54 lbs ?Total lbs lost since last in-office visit: 0 ? ?Interim History: Brandi [name]'s weight remain the same. She is doing well with water and protein.  ? ?Subjective:  ? ?1. Other hyperlipidemia ?Brandi [name] is taking Lipitor currently. ? ?2. Pre-diabetes ?Brandi [name] is not on medications currently.  ? ?Assessment/Plan:  ? ?1. Other hyperlipidemia ?Cardiovascular risk and specific lipid/LDL goals reviewed.  Blayklee will continue Lipitor. We discussed several lifestyle modifications today and Aracelli will continue to work on diet, exercise and weight loss efforts. Orders and follow up as documented in patient record.  ? ?Counseling ?Intensive lifestyle modifications are the first line treatment for this issue. ?Dietary changes: Increase soluble fiber. Decrease simple carbohydrates. ?Exercise changes: Moderate to vigorous-intensity aerobic activity 150 minutes per week if tolerated. ?Lipid-lowering medications: see documented in medical record. ? ?2. Pre-diabetes ?Brandi [name] will continue to minimize carbohydrates and sweets. We will refill Ozempic 2 mg for 1 month with no refills. She will continue to work on weight loss, exercise, and decreasing simple carbohydrates to help decrease the risk of diabetes.  ? ?- Semaglutide, 2 MG/DOSE, (OZEMPIC, 2 MG/DOSE,) 8 MG/3ML SOPN; Inject 2 mg into the skin once a week.  Dispense: 9 mL; Refill: 0 ? ?3. Obesity, current BMI 37.6 ?Brandi [name] is currently in the action stage of  change. As such, her goal is to continue with weight loss efforts. She has agreed to the Category 1 Plan.  ? ?Brandi [name] will continue to adhere closely to the plan. She will increase protein.  ? ?Exercise goals:  As is. ? ?Behavioral modification strategies: increasing lean protein intake, decreasing simple carbohydrates, increasing vegetables, increasing water intake, decreasing eating out, no skipping meals, meal planning and cooking strategies, keeping healthy foods in the home, and planning for success. ? ?Brandi [name] has agreed to follow-up with our clinic in 3-4 weeks (fasting). She was informed of the importance of frequent follow-up visits to maximize her success with intensive lifestyle modifications for her multiple health conditions.  ? ?Objective:  ? ?Blood pressure 104/80, pulse 94, temperature 97.8 ?F (36.6 ?C), height '5\' 4"'$  (1.626 m), weight 219 lb (99.3 kg), SpO2 98 %. ?Body mass index is 37.59 kg/m?. ?Cai is using a walker for ambulation.  ? ?General: Cooperative, alert, well developed, in no acute distress. ?HEENT: Conjunctivae and lids unremarkable. ?Cardiovascular: Regular rhythm.  ?Lungs: Normal work of breathing. ?Neurologic: No focal deficits.  ? ?Lab Results  ?Component Value Date  ? CREATININE 1.29 (H) 02/21/2021  ? BUN 28 (H) 02/21/2021  ? NA 135 02/21/2021  ? K 4.8 02/21/2021  ? CL 108 02/21/2021  ? CO2 23 02/21/2021  ? ?Lab Results  ?Component Value Date  ? ALT 14 10/09/2019  ? AST 18 10/09/2019  ? ALKPHOS 200 (H) 10/09/2019  ? BILITOT 0.4 10/09/2019  ? ?Lab Results  ?Component Value Date  ? HGBA1C 5.8 (H) 08/25/2020  ? HGBA1C 6.0 (H) 06/30/2020  ? HGBA1C 6.0 (H) 03/11/2020  ?  HGBA1C 6.1 (H) 10/09/2019  ? HGBA1C 6.6 (H) 01/06/2019  ? ?Lab Results  ?Component Value Date  ? INSULIN 19.6 08/25/2020  ? ?Lab Results  ?Component Value Date  ? TSH 1.880 08/25/2020  ? ?Lab Results  ?Component Value Date  ? CHOL 123 08/25/2020  ? HDL 47 08/25/2020  ? [place] 58 08/25/2020  ? TRIG 97 08/25/2020  ? CHOLHDL  2.6 03/11/2020  ? ?Lab Results  ?Component Value Date  ? VD25OH 105.0 (H) 08/25/2020  ? ?Lab Results  ?Component Value Date  ? WBC 8.7 02/21/2021  ? HGB 13.0 02/21/2021  ? HCT 38.8 02/21/2021  ? MCV 93.5 02/21/2021  ? PLT 270 02/21/2021  ? ?No results found for: IRON, TIBC, FERRITIN ? ?Attestation Statements:  ? ?Reviewed by clinician on day of visit: allergies, medications, problem list, medical history, surgical history, family history, social history, and previous encounter notes. ? ?I, [name], RMA, am acting as transcriptionist for [employer], DO. ? ?I have reviewed the above documentation for accuracy and completeness, and I agree with the above. [name], DO  ?

## 2021-06-07 DIAGNOSIS — M25352 Other instability, left hip: Secondary | ICD-10-CM | POA: Diagnosis not present

## 2021-06-07 DIAGNOSIS — R531 Weakness: Secondary | ICD-10-CM | POA: Diagnosis not present

## 2021-06-07 DIAGNOSIS — M25652 Stiffness of left hip, not elsewhere classified: Secondary | ICD-10-CM | POA: Diagnosis not present

## 2021-06-14 ENCOUNTER — Ambulatory Visit (INDEPENDENT_AMBULATORY_CARE_PROVIDER_SITE_OTHER): Payer: Medicare Other | Admitting: Nurse Practitioner

## 2021-06-14 ENCOUNTER — Other Ambulatory Visit: Payer: Self-pay

## 2021-06-14 ENCOUNTER — Encounter: Payer: Self-pay | Admitting: Nurse Practitioner

## 2021-06-14 VITALS — BP 126/84 | HR 61 | Temp 98.3°F | Ht 63.0 in | Wt 221.0 lb

## 2021-06-14 DIAGNOSIS — R7303 Prediabetes: Secondary | ICD-10-CM

## 2021-06-14 DIAGNOSIS — M25352 Other instability, left hip: Secondary | ICD-10-CM | POA: Diagnosis not present

## 2021-06-14 DIAGNOSIS — E782 Mixed hyperlipidemia: Secondary | ICD-10-CM | POA: Diagnosis not present

## 2021-06-14 DIAGNOSIS — S73005D Unspecified dislocation of left hip, subsequent encounter: Secondary | ICD-10-CM | POA: Diagnosis not present

## 2021-06-14 DIAGNOSIS — Z6839 Body mass index (BMI) 39.0-39.9, adult: Secondary | ICD-10-CM

## 2021-06-14 DIAGNOSIS — G4733 Obstructive sleep apnea (adult) (pediatric): Secondary | ICD-10-CM | POA: Diagnosis not present

## 2021-06-14 DIAGNOSIS — E6609 Other obesity due to excess calories: Secondary | ICD-10-CM

## 2021-06-14 DIAGNOSIS — R531 Weakness: Secondary | ICD-10-CM | POA: Diagnosis not present

## 2021-06-14 DIAGNOSIS — M25652 Stiffness of left hip, not elsewhere classified: Secondary | ICD-10-CM | POA: Diagnosis not present

## 2021-06-14 DIAGNOSIS — Z23 Encounter for immunization: Secondary | ICD-10-CM

## 2021-06-14 DIAGNOSIS — Z79899 Other long term (current) drug therapy: Secondary | ICD-10-CM

## 2021-06-14 DIAGNOSIS — Z0000 Encounter for general adult medical examination without abnormal findings: Secondary | ICD-10-CM | POA: Diagnosis not present

## 2021-06-14 DIAGNOSIS — I110 Hypertensive heart disease with heart failure: Secondary | ICD-10-CM | POA: Diagnosis not present

## 2021-06-14 LAB — POCT URINALYSIS DIPSTICK
Bilirubin, UA: NEGATIVE
Blood, UA: NEGATIVE
Glucose, UA: NEGATIVE
Ketones, UA: NEGATIVE
Leukocytes, UA: NEGATIVE
Nitrite, UA: NEGATIVE
Protein, UA: NEGATIVE
Spec Grav, UA: 1.01 (ref 1.010–1.025)
Urobilinogen, UA: 0.2 E.U./dL
pH, UA: 6.5 (ref 5.0–8.0)

## 2021-06-14 MED ORDER — TETANUS-DIPHTH-ACELL PERTUSSIS 5-2.5-18.5 LF-MCG/0.5 IM SUSP: 0.5000 mL | Freq: Once | INTRAMUSCULAR | 0 refills | Status: AC

## 2021-06-14 MED ORDER — SHINGRIX 50 MCG/0.5ML IM SUSR: 0.5000 mL | Freq: Once | INTRAMUSCULAR | 0 refills | Status: AC

## 2021-06-14 NOTE — Progress Notes (Signed)
?[employer] as a Education administrator for [employer], FNP.,have documented all relevant documentation on the behalf of [name], FNP,as directed by  [name], FNP while in the presence of [name], [place].  ? ?This visit occurred during the SARS-CoV-2 public health emergency.  Safety protocols were in place, including screening questions prior to the visit, additional usage of staff PPE, and extensive cleaning of exam room while observing appropriate contact time as indicated for disinfecting solutions. ? ?Subjective:  ?  ? Patient ID: Brandi [name] , female    DOB: 1955-04-23 , 66 y.o.   MRN: [id number] ? ? ?Chief Complaint  ?Patient presents with  ? Annual Exam  ? ? ?HPI ? ?Here for HM. The patient is no longer followed by GYN. ?Continues PT for her left hip dislocation x 3 times and being followed by Dr. Mayer Camel ?  ? ?Past Medical History:  ?Diagnosis Date  ? Anemia   ? Arthritis   ? Back pain   ? Edema, lower extremity   ? Food allergy   ? GERD (gastroesophageal reflux disease)   ? Hypertension   ? Hypertensive crisis   ? Joint pain   ? Morbid obesity ([place])   ? Osteoporosis   ? Pre-diabetes   ? Rheumatoid arthritis ([place])   ? Sciatica neuralgia   ? Sleep apnea   ?  ? ?Family History  ?Problem Relation Age of Onset  ? Hypertension Mother   ? Cancer Mother   ? Heart disease Mother   ? Eating disorder Mother   ? Obesity Mother   ? Kidney disease Father   ? Heart disease Father   ? Diabetes Father   ? High blood pressure Father   ? High Cholesterol Father   ? Anxiety disorder Father   ? Bipolar disorder Father   ? Schizophrenia Father   ? Hypertension Sister   ? ? ? ?Current Outpatient Medications:  ?  amLODipine (NORVASC) 5 MG tablet, Take 1.5 tablets (7.5 mg total) by mouth daily. (Patient taking differently: Take 7.5 mg by mouth at bedtime.), Disp: 135 tablet, Rfl: 3 ?  atorvastatin (LIPITOR) 40 MG tablet, TAKE 1 TABLET BY MOUTH EVERY DAY (Patient taking differently: Take 40 mg by mouth every morning.),  Disp: 90 tablet, Rfl: 3 ?  Biotin 1000 MCG tablet, Take 1,000 mcg by mouth every morning., Disp: , Rfl:  ?  cetirizine (ZYRTEC) 10 MG tablet, Take 10 mg by mouth every morning., Disp: , Rfl:  ?  Cyanocobalamin (VITAMIN B-12 PO), Take 1 drop by mouth daily., Disp: , Rfl:  ?  cyclobenzaprine (FLEXERIL) 10 MG tablet, TAKE 1 TABLET(10 MG) BY MOUTH THREE TIMES DAILY as needed, Disp: 90 tablet, Rfl: 1 ?  fluticasone (FLONASE) 50 MCG/ACT nasal spray, 1 spray daily as needed for allergies or rhinitis., Disp: , Rfl:  ?  Magnesium 500 MG TABS, Take 500 mg by mouth at bedtime., Disp: , Rfl:  ?  Nebivolol HCl (BYSTOLIC) 20 MG TABS, Take 1 tablet by mouth once daily (Patient taking differently: Take 20 mg by mouth every morning.), Disp: 90 tablet, Rfl: 1 ?  omeprazole (PRILOSEC OTC) 20 MG tablet, Take 20 mg by mouth every morning., Disp: , Rfl:  ?  omeprazole (PRILOSEC) 20 MG capsule, Take 20 mg by mouth daily., Disp: , Rfl:  ?  OVER THE COUNTER MEDICATION, Take 30 mg by mouth 2 (two) times daily. Strengtia 30 mg, Disp: , Rfl:  ?  OVER THE COUNTER MEDICATION, Take 1  tablet by mouth 2 (two) times daily. Hepato - C1, Disp: , Rfl:  ?  OVER THE COUNTER MEDICATION, Take 1 capsule by mouth 2 (two) times daily. Omega CO3-SE, Disp: , Rfl:  ?  Semaglutide, 2 MG/DOSE, (OZEMPIC, 2 MG/DOSE,) 8 MG/3ML SOPN, Inject 2 mg into the skin once a week., Disp: 9 mL, Rfl: 0 ?  spironolactone (ALDACTONE) 50 MG tablet, Take one tablet by mouth on Monday, Tuesday, Thursday and Friday. (Patient taking differently: Take 50 mg by mouth See admin instructions. Take one tablet  (50 mg) by mouth on Monday, Tuesday, Thursday and Friday.), Disp: 90 tablet, Rfl: 3 ?  topiramate (TOPAMAX) 50 MG tablet, Take 1 tablet (50 mg total) by mouth 2 (two) times daily., Disp: 180 tablet, Rfl: 3 ?  traMADol (ULTRAM) 50 MG tablet, Take 1 tablet (50 mg total) by mouth every 6 (six) hours as needed. (Patient taking differently: Take 50 mg by mouth every 6 (six) hours as needed  (pain).), Disp: 20 tablet, Rfl: 0 ?  valsartan (DIOVAN) 160 MG tablet, Take 1 tablet (160 mg total) by mouth daily., Disp: 90 tablet, Rfl: 3 ?  Vitamin E 180 MG CAPS, Take 180 mg by mouth every morning., Disp: , Rfl:   ? ?Allergies  ?Allergen Reactions  ? Hydralazine Itching  ? Meperidine Hcl Rash and Other (See Comments)  ?  hallucinations  ? Aliskiren Hives  ? Aspirin Nausea Only  ? Juniper Berries Hives  ? Latex Hives  ? Rofecoxib Diarrhea  ?  Reaction to Vioxx  ? Spironolactone Other (See Comments)  ?  High doses caused nausea and rash - may can tolerate low doses  ? Chocolate Nausea And Vomiting and Rash  ? Furosemide Other (See Comments)  ?  Rash and extreme dehydration  ? Oxycodone-Acetaminophen Itching and Rash  ? Sulfa Antibiotics Rash  ?  ? ? ?The patient states she is status post hysterectomy.  No LMP recorded. Patient has had a hysterectomy.. Negative for Dysmenorrhea and Negative for Menorrhagia. Negative for: breast discharge, breast lump(s), breast pain and breast self exam. Associated symptoms include abnormal vaginal bleeding. Pertinent negatives include abnormal bleeding (hematology), anxiety, decreased libido, depression, difficulty falling sleep, dyspareunia, history of infertility, nocturia, sexual dysfunction, sleep disturbances, urinary incontinence, urinary urgency, vaginal discharge and vaginal itching. Diet: with Category 1 with healthy weight and wellness. The patient states her exercise level is minimal but going to PT 2 days a week. She has a left hip brace only taken off when taking shower. She is not downstairs at home, has not been able to maneuver stairs.  ? ?The patient's tobacco use is:  ?Social History  ? ?Tobacco Use  ?Smoking Status Never  ?Smokeless Tobacco Never  ? ?She has been exposed to passive smoke. The patient's alcohol use is:  ?Social History  ? ?Substance and Sexual Activity  ?Alcohol Use Not Currently  ? ? ?Review of Systems  ?Constitutional: Negative.   ?HENT:  Negative.    ?Eyes: Negative.   ?Respiratory: Negative.    ?Cardiovascular: Negative.   ?Gastrointestinal: Negative.   ?Endocrine: Negative.   ?Genitourinary: Negative.   ?Musculoskeletal: Negative.   ?Skin: Negative.   ?Allergic/Immunologic: Negative.   ?Neurological: Negative.  Negative for headaches.  ?Hematological: Negative.   ?Psychiatric/Behavioral: Negative.     ? ?Today's Vitals  ? 06/14/21 1501  ?BP: 126/84  ?Pulse: 61  ?Temp: 98.3 ?F (36.8 ?C)  ?Weight: 221 lb (100.2 kg)  ?Height: '5\' 3"'$  (1.6 m)  ?PainSc: 0-No  pain  ? ?Body mass index is 39.15 kg/m?.  ?Wt Readings from Last 3 Encounters:  ?06/14/21 221 lb (100.2 kg)  ?06/02/21 219 lb (99.3 kg)  ?05/09/21 219 lb (99.3 kg)  ?  ?BP Readings from Last 3 Encounters:  ?06/14/21 126/84  ?06/02/21 104/80  ?05/09/21 122/81  ?  ?Objective:  ?Physical Exam ?Vitals reviewed.  ?Constitutional:   ?   General: She is not in acute distress. ?   Appearance: Normal appearance. She is well-developed. She is obese.  ?   Comments: Central obesity  ?HENT:  ?   Head: Normocephalic and atraumatic.  ?   Right Ear: Hearing, tympanic membrane, ear canal and external ear normal. There is no impacted cerumen.  ?   Left Ear: Hearing, tympanic membrane, ear canal and external ear normal. There is no impacted cerumen.  ?   Nose:  ?   Comments: Deferred - masked ?   Mouth/Throat:  ?   Comments: Deferred - masked ?Eyes:  ?   General: Lids are normal.  ?   Extraocular Movements: Extraocular movements intact.  ?   Conjunctiva/sclera: Conjunctivae normal.  ?   Pupils: Pupils are equal, round, and reactive to light.  ?   Funduscopic exam: ?   Right eye: No papilledema.     ?   Left eye: No papilledema.  ?Neck:  ?   Thyroid: No thyroid mass.  ?   Vascular: No carotid bruit.  ?Cardiovascular:  ?   Rate and Rhythm: Normal rate and regular rhythm.  ?   Pulses: Normal pulses.  ?   Heart sounds: Normal heart sounds. No murmur heard. ?Pulmonary:  ?   Effort: Pulmonary effort is normal. No respiratory  distress.  ?   Breath sounds: Normal breath sounds. No wheezing.  ?Chest:  ?   Chest wall: No mass.  ?Breasts: ?   Tanner Score is 5.  ?   Right: Normal. No mass or tenderness.  ?   Left: Normal. No mas

## 2021-06-15 LAB — CMP14+EGFR
ALT: 14 IU/L (ref 0–32)
AST: 15 IU/L (ref 0–40)
Albumin/Globulin Ratio: 1.7 (ref 1.2–2.2)
Albumin: 4.6 g/dL (ref 3.8–4.8)
Alkaline Phosphatase: 166 IU/L — ABNORMAL HIGH (ref 44–121)
BUN/Creatinine Ratio: 22 (ref 12–28)
BUN: 24 mg/dL (ref 8–27)
Bilirubin Total: 0.3 mg/dL (ref 0.0–1.2)
CO2: 20 mmol/L (ref 20–29)
Calcium: 9.9 mg/dL (ref 8.7–10.3)
Chloride: 105 mmol/L (ref 96–106)
Creatinine, Ser: 1.07 mg/dL — ABNORMAL HIGH (ref 0.57–1.00)
Globulin, Total: 2.7 g/dL (ref 1.5–4.5)
Glucose: 83 mg/dL (ref 70–99)
Potassium: 4.5 mmol/L (ref 3.5–5.2)
Sodium: 140 mmol/L (ref 134–144)
Total Protein: 7.3 g/dL (ref 6.0–8.5)
eGFR: 57 mL/min/{1.73_m2} — ABNORMAL LOW (ref >59)

## 2021-06-15 LAB — LIPID PANEL
Chol/HDL Ratio: 2.3 ratio (ref 0.0–4.4)
Cholesterol, Total: 117 mg/dL (ref 100–199)
HDL: 50 mg/dL (ref >39)
LDL Chol Calc (NIH): 55 mg/dL (ref 0–99)
Triglycerides: 55 mg/dL (ref 0–149)
VLDL Cholesterol Cal: 12 mg/dL (ref 5–40)

## 2021-06-15 LAB — CBC
Hematocrit: 36.5 % (ref 34.0–46.6)
Hemoglobin: 12.6 g/dL (ref 11.1–15.9)
MCH: 31.3 pg (ref 26.6–33.0)
MCHC: 34.5 g/dL (ref 31.5–35.7)
MCV: 91 fL (ref 79–97)
Platelets: 269 10*3/uL (ref 150–450)
RBC: 4.02 x10E6/uL (ref 3.77–5.28)
RDW: 12.4 % (ref 11.7–15.4)
WBC: 7.5 10*3/uL (ref 3.4–10.8)

## 2021-06-15 LAB — MICROALBUMIN / CREATININE URINE RATIO
Creatinine, Urine: 47.3 mg/dL
Microalb/Creat Ratio: <6 mg/g creat (ref 0–29)
Microalbumin, Urine: <3 ug/mL

## 2021-06-15 LAB — HEMOGLOBIN A1C
Est. average glucose Bld gHb Est-mCnc: 111 mg/dL
Hgb A1c MFr Bld: 5.5 % (ref 4.8–5.6)

## 2021-06-15 LAB — VITAMIN D 25 HYDROXY (VIT D DEFICIENCY, FRACTURES): Vit D, 25-Hydroxy: 48.4 ng/mL (ref 30.0–100.0)

## 2021-06-16 ENCOUNTER — Telehealth: Payer: Self-pay

## 2021-06-16 ENCOUNTER — Telehealth: Payer: Medicare Other

## 2021-06-16 DIAGNOSIS — M25652 Stiffness of left hip, not elsewhere classified: Secondary | ICD-10-CM | POA: Diagnosis not present

## 2021-06-16 DIAGNOSIS — R531 Weakness: Secondary | ICD-10-CM | POA: Diagnosis not present

## 2021-06-16 DIAGNOSIS — M25352 Other instability, left hip: Secondary | ICD-10-CM | POA: Diagnosis not present

## 2021-06-16 NOTE — Telephone Encounter (Signed)
?  Care Management  ? ?Follow Up Note ? ? ?06/16/2021 ?Name: Brandi [name] MRN: [id number] DOB: Jul 25, 1955 ? ? ?Referred by: [name], FNP ?Reason for referral : Chronic Care Management (RN CM Follow up call ) ? ? ?An unsuccessful telephone outreach was attempted today. The patient was referred to the case management team for assistance with care management and care coordination.  ? ?Follow Up Plan: A HIPPA compliant phone message was left for the patient providing contact information and requesting a return call.  ? ?Barb Merino, RN, BSN, CCM ?Care Management Coordinator ?[facility]  ?Direct Phone: [phone] ? ? ?

## 2021-06-21 ENCOUNTER — Encounter: Payer: Self-pay | Admitting: Adult Health

## 2021-06-21 ENCOUNTER — Other Ambulatory Visit (HOSPITAL_BASED_OUTPATIENT_CLINIC_OR_DEPARTMENT_OTHER): Payer: Self-pay

## 2021-06-21 DIAGNOSIS — M25652 Stiffness of left hip, not elsewhere classified: Secondary | ICD-10-CM | POA: Diagnosis not present

## 2021-06-21 DIAGNOSIS — M25352 Other instability, left hip: Secondary | ICD-10-CM | POA: Diagnosis not present

## 2021-06-21 DIAGNOSIS — R531 Weakness: Secondary | ICD-10-CM | POA: Diagnosis not present

## 2021-06-27 DIAGNOSIS — R531 Weakness: Secondary | ICD-10-CM | POA: Diagnosis not present

## 2021-06-27 DIAGNOSIS — M25652 Stiffness of left hip, not elsewhere classified: Secondary | ICD-10-CM | POA: Diagnosis not present

## 2021-06-27 DIAGNOSIS — M25352 Other instability, left hip: Secondary | ICD-10-CM | POA: Diagnosis not present

## 2021-06-28 DIAGNOSIS — M25352 Other instability, left hip: Secondary | ICD-10-CM | POA: Diagnosis not present

## 2021-06-29 DIAGNOSIS — M25652 Stiffness of left hip, not elsewhere classified: Secondary | ICD-10-CM | POA: Diagnosis not present

## 2021-06-29 DIAGNOSIS — M25352 Other instability, left hip: Secondary | ICD-10-CM | POA: Diagnosis not present

## 2021-06-29 DIAGNOSIS — R531 Weakness: Secondary | ICD-10-CM | POA: Diagnosis not present

## 2021-07-04 ENCOUNTER — Ambulatory Visit (INDEPENDENT_AMBULATORY_CARE_PROVIDER_SITE_OTHER): Payer: Medicare Other | Admitting: Bariatrics

## 2021-07-05 ENCOUNTER — Other Ambulatory Visit (HOSPITAL_BASED_OUTPATIENT_CLINIC_OR_DEPARTMENT_OTHER): Payer: Self-pay

## 2021-07-07 ENCOUNTER — Telehealth: Payer: Medicare Other

## 2021-07-07 ENCOUNTER — Ambulatory Visit: Payer: Medicare Other | Admitting: Adult Health

## 2021-07-14 ENCOUNTER — Encounter (HOSPITAL_BASED_OUTPATIENT_CLINIC_OR_DEPARTMENT_OTHER): Payer: Self-pay

## 2021-07-14 ENCOUNTER — Other Ambulatory Visit (HOSPITAL_BASED_OUTPATIENT_CLINIC_OR_DEPARTMENT_OTHER): Payer: Self-pay

## 2021-07-14 DIAGNOSIS — M25652 Stiffness of left hip, not elsewhere classified: Secondary | ICD-10-CM | POA: Diagnosis not present

## 2021-07-14 DIAGNOSIS — R531 Weakness: Secondary | ICD-10-CM | POA: Diagnosis not present

## 2021-07-14 DIAGNOSIS — M25352 Other instability, left hip: Secondary | ICD-10-CM | POA: Diagnosis not present

## 2021-07-15 ENCOUNTER — Other Ambulatory Visit (INDEPENDENT_AMBULATORY_CARE_PROVIDER_SITE_OTHER): Payer: Self-pay | Admitting: Bariatrics

## 2021-07-15 ENCOUNTER — Other Ambulatory Visit (HOSPITAL_BASED_OUTPATIENT_CLINIC_OR_DEPARTMENT_OTHER): Payer: Self-pay

## 2021-07-15 DIAGNOSIS — R7303 Prediabetes: Secondary | ICD-10-CM

## 2021-07-18 ENCOUNTER — Other Ambulatory Visit (HOSPITAL_BASED_OUTPATIENT_CLINIC_OR_DEPARTMENT_OTHER): Payer: Self-pay

## 2021-07-19 DIAGNOSIS — R531 Weakness: Secondary | ICD-10-CM | POA: Diagnosis not present

## 2021-07-19 DIAGNOSIS — M25352 Other instability, left hip: Secondary | ICD-10-CM | POA: Diagnosis not present

## 2021-07-19 DIAGNOSIS — M25652 Stiffness of left hip, not elsewhere classified: Secondary | ICD-10-CM | POA: Diagnosis not present

## 2021-07-20 DIAGNOSIS — M25352 Other instability, left hip: Secondary | ICD-10-CM | POA: Diagnosis not present

## 2021-07-20 DIAGNOSIS — M25652 Stiffness of left hip, not elsewhere classified: Secondary | ICD-10-CM | POA: Diagnosis not present

## 2021-07-20 DIAGNOSIS — R531 Weakness: Secondary | ICD-10-CM | POA: Diagnosis not present

## 2021-07-26 ENCOUNTER — Ambulatory Visit (INDEPENDENT_AMBULATORY_CARE_PROVIDER_SITE_OTHER): Payer: Medicare Other | Admitting: Bariatrics

## 2021-07-26 ENCOUNTER — Encounter (INDEPENDENT_AMBULATORY_CARE_PROVIDER_SITE_OTHER): Payer: Self-pay | Admitting: Bariatrics

## 2021-07-26 VITALS — BP 136/87 | Temp 97.5°F | Ht 64.0 in | Wt 208.0 lb

## 2021-07-26 DIAGNOSIS — Z6835 Body mass index (BMI) 35.0-35.9, adult: Secondary | ICD-10-CM

## 2021-07-26 DIAGNOSIS — E669 Obesity, unspecified: Secondary | ICD-10-CM | POA: Diagnosis not present

## 2021-07-26 DIAGNOSIS — E785 Hyperlipidemia, unspecified: Secondary | ICD-10-CM

## 2021-07-26 DIAGNOSIS — I10 Essential (primary) hypertension: Secondary | ICD-10-CM

## 2021-07-26 DIAGNOSIS — E7849 Other hyperlipidemia: Secondary | ICD-10-CM

## 2021-07-30 ENCOUNTER — Encounter: Payer: Self-pay | Admitting: Nurse Practitioner

## 2021-08-01 ENCOUNTER — Other Ambulatory Visit: Payer: Self-pay

## 2021-08-01 MED ORDER — CYCLOBENZAPRINE HCL 10 MG PO TABS: ORAL_TABLET | ORAL | 1 refills | Status: DC

## 2021-08-03 NOTE — Progress Notes (Signed)
Chief Complaint:   OBESITY Brandi [name] is here to discuss her progress with her obesity treatment plan along with follow-up of her obesity related diagnoses. Halena is on the Category 1 Plan and states she is following her eating plan approximately 100% of the time. Brandi [name] states she is walking for 45 minutes 2 times per week.  Today's visit was #: 86 Starting weight: 273 lbs Starting date: 08/25/2020 Today's weight: 208 lbs Today's date: 07/26/2021 Total lbs lost to date: 65 lbs Total lbs lost since last in-office visit: 11 lbs  Interim History: Brandi [name] is down 11 lbs since her last visit.   Subjective:   1. Other hyperlipidemia Brandi [name] is currently taking Lipitor. She notes no side effects.   2. Essential hypertension Brandi [name]'s blood pressure is reasonably well controlled. Her blood pressure today was 136/87.  Assessment/Plan:   1. Other hyperlipidemia Cardiovascular risk and specific lipid/LDL goals reviewed.  Monti will continue taking Lipitor. We discussed several lifestyle modifications today and Shaima will continue to work on diet, exercise and weight loss efforts. Orders and follow up as documented in patient record.   Counseling Intensive lifestyle modifications are the first line treatment for this issue. Dietary changes: Increase soluble fiber. Decrease simple carbohydrates. Exercise changes: Moderate to vigorous-intensity aerobic activity 150 minutes per week if tolerated. Lipid-lowering medications: see documented in medical record.  2. Essential hypertension Brandi [name] is working on healthy weight loss and exercise to improve blood pressure control. Brandi [name] will continue taking her medications. We will watch for signs of hypotension as she continues her lifestyle modifications.  3. Obesity, Current BMI 35.8 Brandi [name] is currently in the action stage of change. As such, her goal is to continue with weight loss efforts. She has agreed to the Category 1 Plan.   Brandi [name] will continue to  adhere to the plan. She will continue with water and protein. She will continue to use her air fryer.   Exercise goals:  As is.   Brandi [name] will continue to walk and will move to using her cane for ambulation, she will also do chair exercises.   Behavioral modification strategies: increasing lean protein intake, decreasing simple carbohydrates, increasing vegetables, increasing water intake, decreasing eating out, no skipping meals, meal planning and cooking strategies, keeping healthy foods in the home, and planning for success.  Brandi [name] has agreed to follow-up with our clinic in 3-4 weeks with nurse practitioner and 7-8 weeks with myself. She was informed of the importance of frequent follow-up visits to maximize her success with intensive lifestyle modifications for her multiple health conditions.   Objective:   Blood pressure 136/87, temperature (!) 97.5 F (36.4 C), height '5\' 4"'$  (1.626 m), weight 208 lb (94.3 kg). Body mass index is 35.7 kg/m.  General: Cooperative, alert, well developed, in no acute distress. HEENT: Conjunctivae and lids unremarkable. Cardiovascular: Regular rhythm.  Lungs: Normal work of breathing. Neurologic: No focal deficits.   Lab Results  Component Value Date   CREATININE 1.07 (H) 06/14/2021   BUN 24 06/14/2021   NA 140 06/14/2021   K 4.5 06/14/2021   CL 105 06/14/2021   CO2 20 06/14/2021   Lab Results  Component Value Date   ALT 14 06/14/2021   AST 15 06/14/2021   ALKPHOS 166 (H) 06/14/2021   BILITOT 0.3 06/14/2021   Lab Results  Component Value Date   HGBA1C 5.5 06/14/2021   HGBA1C 5.8 (H) 08/25/2020   HGBA1C 6.0 (H) 06/30/2020   HGBA1C 6.0 (H) 03/11/2020  HGBA1C 6.1 (H) 10/09/2019   Lab Results  Component Value Date   INSULIN 19.6 08/25/2020   Lab Results  Component Value Date   TSH 1.880 08/25/2020   Lab Results  Component Value Date   CHOL 117 06/14/2021   HDL 50 06/14/2021   LDLCALC 55 06/14/2021   TRIG 55 06/14/2021   CHOLHDL  2.3 06/14/2021   Lab Results  Component Value Date   VD25OH 48.4 06/14/2021   VD25OH 105.0 (H) 08/25/2020   Lab Results  Component Value Date   WBC 7.5 06/14/2021   HGB 12.6 06/14/2021   HCT 36.5 06/14/2021   MCV 91 06/14/2021   PLT 269 06/14/2021   No results found for: IRON, TIBC, FERRITIN  Attestation Statements:   Reviewed by clinician on day of visit: allergies, medications, problem list, medical history, surgical history, family history, social history, and previous encounter notes.  I, [name], RMA, am acting as Location manager for [employer], DO.  I have reviewed the above documentation for accuracy and completeness, and I agree with the above. [name], DO

## 2021-08-04 DIAGNOSIS — R531 Weakness: Secondary | ICD-10-CM | POA: Diagnosis not present

## 2021-08-04 DIAGNOSIS — M25652 Stiffness of left hip, not elsewhere classified: Secondary | ICD-10-CM | POA: Diagnosis not present

## 2021-08-04 DIAGNOSIS — M25352 Other instability, left hip: Secondary | ICD-10-CM | POA: Diagnosis not present

## 2021-08-09 DIAGNOSIS — M25352 Other instability, left hip: Secondary | ICD-10-CM | POA: Diagnosis not present

## 2021-08-09 DIAGNOSIS — R531 Weakness: Secondary | ICD-10-CM | POA: Diagnosis not present

## 2021-08-09 DIAGNOSIS — M25652 Stiffness of left hip, not elsewhere classified: Secondary | ICD-10-CM | POA: Diagnosis not present

## 2021-08-18 DIAGNOSIS — R531 Weakness: Secondary | ICD-10-CM | POA: Diagnosis not present

## 2021-08-18 DIAGNOSIS — M25652 Stiffness of left hip, not elsewhere classified: Secondary | ICD-10-CM | POA: Diagnosis not present

## 2021-08-18 DIAGNOSIS — M25352 Other instability, left hip: Secondary | ICD-10-CM | POA: Diagnosis not present

## 2021-08-23 DIAGNOSIS — R531 Weakness: Secondary | ICD-10-CM | POA: Diagnosis not present

## 2021-08-23 DIAGNOSIS — M25652 Stiffness of left hip, not elsewhere classified: Secondary | ICD-10-CM | POA: Diagnosis not present

## 2021-08-23 DIAGNOSIS — M25352 Other instability, left hip: Secondary | ICD-10-CM | POA: Diagnosis not present

## 2021-08-25 ENCOUNTER — Other Ambulatory Visit (HOSPITAL_BASED_OUTPATIENT_CLINIC_OR_DEPARTMENT_OTHER): Payer: Self-pay

## 2021-08-25 ENCOUNTER — Ambulatory Visit (INDEPENDENT_AMBULATORY_CARE_PROVIDER_SITE_OTHER): Payer: Medicare Other | Admitting: Adult Health

## 2021-08-25 ENCOUNTER — Encounter (INDEPENDENT_AMBULATORY_CARE_PROVIDER_SITE_OTHER): Payer: Self-pay | Admitting: Adult Health

## 2021-08-25 VITALS — BP 108/65 | HR 89 | Temp 98.2°F | Ht 64.0 in | Wt 205.0 lb

## 2021-08-25 DIAGNOSIS — E669 Obesity, unspecified: Secondary | ICD-10-CM

## 2021-08-25 DIAGNOSIS — Z7985 Long-term (current) use of injectable non-insulin antidiabetic drugs: Secondary | ICD-10-CM

## 2021-08-25 DIAGNOSIS — Z6835 Body mass index (BMI) 35.0-35.9, adult: Secondary | ICD-10-CM

## 2021-08-25 DIAGNOSIS — I10 Essential (primary) hypertension: Secondary | ICD-10-CM

## 2021-08-25 DIAGNOSIS — R7303 Prediabetes: Secondary | ICD-10-CM

## 2021-08-25 MED ORDER — OZEMPIC (2 MG/DOSE) 8 MG/3ML ~~LOC~~ SOPN
2.0000 mg | PEN_INJECTOR | SUBCUTANEOUS | 0 refills | Status: DC
  Filled 2021-08-25 – 2021-09-06 (×2): qty 9, 84d supply, fill #0

## 2021-08-30 DIAGNOSIS — M25352 Other instability, left hip: Secondary | ICD-10-CM | POA: Diagnosis not present

## 2021-08-30 DIAGNOSIS — M25652 Stiffness of left hip, not elsewhere classified: Secondary | ICD-10-CM | POA: Diagnosis not present

## 2021-08-30 DIAGNOSIS — R531 Weakness: Secondary | ICD-10-CM | POA: Diagnosis not present

## 2021-08-31 NOTE — Progress Notes (Signed)
Chief Complaint:   OBESITY Brandi [name] is here to discuss her progress with her obesity treatment plan along with follow-up of her obesity related diagnoses. Brandi [name] is on the Category 1 Plan and states she is following her eating plan approximately 100% of the time. Brandi [name] states she is walking/PT for 30 minutes 7 times per week.  Today's visit was #: 14 Starting weight: 273 lbs Starting date: 08/25/2020 Today's weight: 205 lbs Today's date: 08/25/21 Total lbs lost to date: 68 lbs Total lbs lost since last in-office visit: -3  Interim History:  She is currently in out pt Physical Therapy. Treatment for left hip dislocation and also to improve balance/stability/strengthening. She is living on first floor of her daughter's home-wants to regain enough strength to climb 14 stairs to get "back to my bedroom".  Subjective:   1. Pre-diabetes She is only on Ozempic 2 mg once weekly-managed by [facility]. She denies mass in neck, dysphagia,, dyspepsia, persistent hoarseness, abd pain, or N/V/Constipation. 06/14/2021 Brandi [name] 5.5-at goal!  2. Benign essential hypertension Home BP readings SBP:110s DBP:60-70 She denies symptoms of hypotension. Cardiology/Dr. Daneen Schick manages-Norvasc 5 mg once daily, nebivolol 20 mg once daily, Diovan 160 mg once daily, Aldactone 50 mg Monday, Tuesday, Wednesday, Thursday, Friday.  Assessment/Plan:   1. Pre-diabetes Refill - Semaglutide, 2 MG/DOSE, (OZEMPIC, 2 MG/DOSE,) 8 MG/3ML SOPN; Inject 2 mg into the skin once a week.  Dispense: 9 mL; Refill: 0  2. Benign essential hypertension Continue to closely monitor BP and for sx's of hypotension. Follow-up with cardiology in July.  3. Obesity, Current BMI 35.2 Brandi [name] is currently in the action stage of change. As such, her goal is to continue with weight loss efforts. She has agreed to the Category 1 Plan.   Exercise goals: as is.  Behavioral modification strategies: increasing lean protein intake,  decreasing simple carbohydrates, meal planning and cooking strategies, keeping healthy foods in the home, and planning for success.  Brandi [name] has agreed to follow-up with our clinic in 4 weeks. She was informed of the importance of frequent follow-up visits to maximize her success with intensive lifestyle modifications for her multiple health conditions.   Objective:   Blood pressure 108/65, pulse 89, temperature 98.2 F (36.8 C), height '5\' 4"'$  (1.626 m), weight 205 lb (93 kg), SpO2 98 %. Body mass index is 35.19 kg/m.  General: Cooperative, alert, well developed, in no acute distress. HEENT: Conjunctivae and lids unremarkable. Cardiovascular: Regular rhythm.  Lungs: Normal work of breathing. Neurologic: No focal deficits.   Lab Results  Component Value Date   CREATININE 1.07 (H) 06/14/2021   BUN 24 06/14/2021   NA 140 06/14/2021   K 4.5 06/14/2021   CL 105 06/14/2021   CO2 20 06/14/2021   Lab Results  Component Value Date   ALT 14 06/14/2021   AST 15 06/14/2021   ALKPHOS 166 (H) 06/14/2021   BILITOT 0.3 06/14/2021   Lab Results  Component Value Date   HGBA1C 5.5 06/14/2021   HGBA1C 5.8 (H) 08/25/2020   HGBA1C 6.0 (H) 06/30/2020   HGBA1C 6.0 (H) 03/11/2020   HGBA1C 6.1 (H) 10/09/2019   Lab Results  Component Value Date   INSULIN 19.6 08/25/2020   Lab Results  Component Value Date   TSH 1.880 08/25/2020   Lab Results  Component Value Date   CHOL 117 06/14/2021   HDL 50 06/14/2021   LDLCALC 55 06/14/2021   TRIG 55 06/14/2021   CHOLHDL 2.3 06/14/2021  Lab Results  Component Value Date   VD25OH 48.4 06/14/2021   VD25OH 105.0 (H) 08/25/2020   Lab Results  Component Value Date   WBC 7.5 06/14/2021   HGB 12.6 06/14/2021   HCT 36.5 06/14/2021   MCV 91 06/14/2021   PLT 269 06/14/2021   No results found for: IRON, TIBC, FERRITIN  Obesity Behavioral Intervention:   Approximately 15 minutes were spent on the discussion below.  ASK: We discussed the  diagnosis of obesity with [name] today and [name] agreed to give [name] permission to discuss obesity behavioral modification therapy today.  ASSESS: [name] has the diagnosis of obesity and her BMI today is 35.2. [name] is in the action stage of change.   ADVISE: [name] was educated on the multiple health risks of obesity as well as the benefit of weight loss to improve her health. She was advised of the need for long term treatment and the importance of lifestyle modifications to improve her current health and to decrease her risk of future health problems.  AGREE: Multiple dietary modification options and treatment options were discussed and [name] agreed to follow the recommendations documented in the above note.  ARRANGE: [name] was educated on the importance of frequent visits to treat obesity as outlined per CMS and USPSTF guidelines and agreed to schedule her next follow up appointment today.  Attestation Statements:   Reviewed by clinician on day of visit: allergies, medications, problem list, medical history, surgical history, family history, social history, and previous encounter notes.  I, Georgianne Fick, FNP, am acting as Location manager for Mina Marble, NP.  I have reviewed the above documentation for accuracy and completeness, and I agree with the above. -  Nivan Melendrez d. Kesley Mullens, NP-C

## 2021-09-06 ENCOUNTER — Other Ambulatory Visit (HOSPITAL_BASED_OUTPATIENT_CLINIC_OR_DEPARTMENT_OTHER): Payer: Self-pay

## 2021-09-13 ENCOUNTER — Other Ambulatory Visit: Payer: Self-pay

## 2021-09-13 MED ORDER — NEBIVOLOL HCL 20 MG PO TABS
ORAL_TABLET | ORAL | 0 refills | Status: DC
Start: 2021-09-13 — End: 2022-01-16

## 2021-09-14 DIAGNOSIS — M25652 Stiffness of left hip, not elsewhere classified: Secondary | ICD-10-CM | POA: Diagnosis not present

## 2021-09-14 DIAGNOSIS — R531 Weakness: Secondary | ICD-10-CM | POA: Diagnosis not present

## 2021-09-14 DIAGNOSIS — M25352 Other instability, left hip: Secondary | ICD-10-CM | POA: Diagnosis not present

## 2021-09-22 ENCOUNTER — Ambulatory Visit (INDEPENDENT_AMBULATORY_CARE_PROVIDER_SITE_OTHER): Payer: Medicare Other | Admitting: Bariatrics

## 2021-09-22 ENCOUNTER — Encounter (INDEPENDENT_AMBULATORY_CARE_PROVIDER_SITE_OTHER): Payer: Self-pay | Admitting: Bariatrics

## 2021-09-22 VITALS — BP 116/81 | HR 84 | Temp 97.5°F | Ht 64.0 in | Wt 206.0 lb

## 2021-09-22 DIAGNOSIS — R7303 Prediabetes: Secondary | ICD-10-CM

## 2021-09-22 DIAGNOSIS — Z6835 Body mass index (BMI) 35.0-35.9, adult: Secondary | ICD-10-CM | POA: Diagnosis not present

## 2021-09-22 DIAGNOSIS — E669 Obesity, unspecified: Secondary | ICD-10-CM

## 2021-09-22 DIAGNOSIS — E7849 Other hyperlipidemia: Secondary | ICD-10-CM | POA: Diagnosis not present

## 2021-09-22 DIAGNOSIS — E559 Vitamin D deficiency, unspecified: Secondary | ICD-10-CM | POA: Insufficient documentation

## 2021-09-22 DIAGNOSIS — Z7985 Long-term (current) use of injectable non-insulin antidiabetic drugs: Secondary | ICD-10-CM

## 2021-09-26 NOTE — Progress Notes (Unsigned)
Chief Complaint:   OBESITY Brandi [name] is here to discuss her progress with her obesity treatment plan along with follow-up of her obesity related diagnoses. Brandi [name] is on the Category 1 Plan and states she is following her eating plan approximately 100% of the time. Brandi [name] states she is walking as much as she can.    Today's visit was #: 15 Starting weight: 273 lbs Starting date: 08/25/2020 Today's weight: 206 lbs Today's date: 09/22/2021 Total lbs lost to date: 34 Total lbs lost since last in-office visit: 0  Interim History: Brandi [name] is up 1 pound since her last visit, but she has done very well overall.  According to the bioimpedance scale, her body water weight is up 1 pound.  She feels better.  Subjective:   1. Pre-diabetes Keron is taking Ozempic, and she denies polyphagia.  2. Other hyperlipidemia Brandi [name] is taking Lipitor currently.  Assessment/Plan:   1. Pre-diabetes Brandi [name] will continue Ozempic.  She will continue to make good food choices.  2. Other hyperlipidemia Brandi [name] will continue Lipitor as directed.  3. Obesity, Current BMI 35.5 Brandi [name] is currently in the action stage of change. As such, her goal is to continue with weight loss efforts. She has agreed to the Category 1 Plan.   Meal planning was discussed.  Brandi [name] will adhere very closely to her category 1 plan.  She is to not skip meals.  Exercise goals: Continue walking as tolerated, with use of cane.  Behavioral modification strategies: increasing lean protein intake, decreasing simple carbohydrates, increasing vegetables, increasing water intake, decreasing eating out, no skipping meals, meal planning and cooking strategies, keeping healthy foods in the home, and planning for success.  Brandi [name] has agreed to follow-up with our clinic in 4 weeks. She was informed of the importance of frequent follow-up visits to maximize her success with intensive lifestyle modifications for her multiple health conditions.   Objective:    Blood pressure 116/81, pulse 84, temperature (!) 97.5 F (36.4 C), height '5\' 4"'$  (1.626 m), weight 206 lb (93.4 kg), SpO2 98 %. Body mass index is 35.36 kg/m.  Walking with a 3 prong cane. General: Cooperative, alert, well developed, in no acute distress. HEENT: Conjunctivae and lids unremarkable. Cardiovascular: Regular rhythm.  Lungs: Normal work of breathing. Neurologic: No focal deficits.   Lab Results  Component Value Date   CREATININE 1.07 (H) 06/14/2021   BUN 24 06/14/2021   NA 140 06/14/2021   K 4.5 06/14/2021   CL 105 06/14/2021   CO2 20 06/14/2021   Lab Results  Component Value Date   ALT 14 06/14/2021   AST 15 06/14/2021   ALKPHOS 166 (H) 06/14/2021   BILITOT 0.3 06/14/2021   Lab Results  Component Value Date   HGBA1C 5.5 06/14/2021   HGBA1C 5.8 (H) 08/25/2020   HGBA1C 6.0 (H) 06/30/2020   HGBA1C 6.0 (H) 03/11/2020   HGBA1C 6.1 (H) 10/09/2019   Lab Results  Component Value Date   INSULIN 19.6 08/25/2020   Lab Results  Component Value Date   TSH 1.880 08/25/2020   Lab Results  Component Value Date   CHOL 117 06/14/2021   HDL 50 06/14/2021   LDLCALC 55 06/14/2021   TRIG 55 06/14/2021   CHOLHDL 2.3 06/14/2021   Lab Results  Component Value Date   VD25OH 48.4 06/14/2021   VD25OH 105.0 (H) 08/25/2020   Lab Results  Component Value Date   WBC 7.5 06/14/2021   HGB 12.6 06/14/2021   HCT 36.5 06/14/2021  MCV 91 06/14/2021   PLT 269 06/14/2021   No results found for: "IRON", "TIBC", "FERRITIN"  Attestation Statements:   Reviewed by clinician on day of visit: allergies, medications, problem list, medical history, surgical history, family history, social history, and previous encounter notes.   Wilhemena Durie, am acting as Location manager for [employer], DO.  I have reviewed the above documentation for accuracy and completeness, and I agree with the above. [name], DO

## 2021-09-28 ENCOUNTER — Ambulatory Visit (INDEPENDENT_AMBULATORY_CARE_PROVIDER_SITE_OTHER): Payer: Medicare Other | Admitting: Nurse Practitioner

## 2021-09-28 ENCOUNTER — Encounter (INDEPENDENT_AMBULATORY_CARE_PROVIDER_SITE_OTHER): Payer: Self-pay | Admitting: Bariatrics

## 2021-09-28 ENCOUNTER — Encounter: Payer: Self-pay | Admitting: Nurse Practitioner

## 2021-09-28 VITALS — BP 132/64 | HR 87 | Temp 98.4°F | Ht 64.0 in | Wt 208.8 lb

## 2021-09-28 DIAGNOSIS — E669 Obesity, unspecified: Secondary | ICD-10-CM

## 2021-09-28 DIAGNOSIS — I110 Hypertensive heart disease with heart failure: Secondary | ICD-10-CM

## 2021-09-28 DIAGNOSIS — E782 Mixed hyperlipidemia: Secondary | ICD-10-CM

## 2021-09-28 DIAGNOSIS — M62830 Muscle spasm of back: Secondary | ICD-10-CM

## 2021-09-28 DIAGNOSIS — E1169 Type 2 diabetes mellitus with other specified complication: Secondary | ICD-10-CM | POA: Diagnosis not present

## 2021-09-28 DIAGNOSIS — Z6835 Body mass index (BMI) 35.0-35.9, adult: Secondary | ICD-10-CM

## 2021-09-28 DIAGNOSIS — R21 Rash and other nonspecific skin eruption: Secondary | ICD-10-CM

## 2021-09-28 DIAGNOSIS — M5431 Sciatica, right side: Secondary | ICD-10-CM

## 2021-09-28 MED ORDER — TRIAMCINOLONE ACETONIDE 0.1 % EX OINT: 1.0000 | TOPICAL_OINTMENT | Freq: Two times a day (BID) | CUTANEOUS | 2 refills | Status: AC

## 2021-09-28 MED ORDER — TIZANIDINE HCL 2 MG PO CAPS: 2.0000 mg | ORAL_CAPSULE | Freq: Three times a day (TID) | ORAL | 1 refills | Status: DC | PRN

## 2021-09-28 NOTE — Progress Notes (Signed)
I,Brandi [name],acting as a Education administrator for [employer], FNP.,have documented all relevant documentation on the behalf of Brandi Brine, FNP,as directed by  Brandi Brine, FNP while in the presence of Brandi [name], [place].  Subjective:     Patient ID: Brandi [name] , female    DOB: 15-Sep-1955 , 66 y.o.   MRN: [id number]   Chief Complaint  Patient presents with   Diabetes    HPI  Pt is here today for a BPC and DM f/u .   Patient presents today with Brandi [name] her daughter. Patient states her leg is really hurting needs a referral for her nerve in leg two 2 weeks ago. She was doing PT until she began having so much pain to her sciatic nerve. Would like to go back to [place] for sciatic nerve pain.   Diabetes She presents for her follow-up diabetic visit. She has type 2 diabetes mellitus. Her disease course has been stable. Pertinent negatives for hypoglycemia include no dizziness or headaches. Pertinent negatives for diabetes include no blurred vision, no chest pain, no fatigue, no polydipsia, no polyphagia and no polyuria. Symptoms are stable. There are no diabetic complications. Risk factors for coronary artery disease include dyslipidemia, obesity and sedentary lifestyle. Current diabetic treatment includes diet. She is compliant with treatment all of the time. Home blood sugar record trend: fluctuating. (Blood sugar today was 93)  Hypertension This is a chronic problem. The current episode started more than 1 year ago. The problem has been gradually improving since onset. The problem is uncontrolled. Pertinent negatives include no anxiety, blurred vision, chest pain, headaches or palpitations. Risk factors for coronary artery disease include obesity, dyslipidemia and sedentary lifestyle. Past treatments include calcium channel blockers. There are no compliance problems.  There is no history of angina. There is no history of chronic renal disease.     Past Medical History:  Diagnosis Date   Anemia     Arthritis    Back pain    Edema, lower extremity    Food allergy    GERD (gastroesophageal reflux disease)    Hypertension    Hypertensive crisis    Joint pain    Morbid obesity ([place])    Osteoporosis    Pre-diabetes    Rheumatoid arthritis ([place])    Sciatica neuralgia    Sleep apnea      Family History  Problem Relation Age of Onset   Hypertension Mother    Cancer Mother    Heart disease Mother    Eating disorder Mother    Obesity Mother    Kidney disease Father    Heart disease Father    Diabetes Father    High blood pressure Father    High Cholesterol Father    Anxiety disorder Father    Bipolar disorder Father    Schizophrenia Father    Hypertension Sister      Current Outpatient Medications:    amLODipine (NORVASC) 5 MG tablet, Take 1.5 tablets (7.5 mg total) by mouth daily. (Patient taking differently: Take 7.5 mg by mouth at bedtime.), Disp: 135 tablet, Rfl: 3   atorvastatin (LIPITOR) 40 MG tablet, TAKE 1 TABLET BY MOUTH EVERY DAY (Patient taking differently: Take 40 mg by mouth every morning.), Disp: 90 tablet, Rfl: 3   Biotin 1000 MCG tablet, Take 1,000 mcg by mouth every morning., Disp: , Rfl:    cetirizine (ZYRTEC) 10 MG tablet, Take 10 mg by mouth every morning., Disp: , Rfl:    Cyanocobalamin (VITAMIN B-12 PO),  Take 1 drop by mouth daily., Disp: , Rfl:    fluticasone (FLONASE) 50 MCG/ACT nasal spray, 1 spray daily as needed for allergies or rhinitis., Disp: , Rfl:    Magnesium 500 MG TABS, Take 500 mg by mouth at bedtime., Disp: , Rfl:    Nebivolol HCl (BYSTOLIC) 20 MG TABS, Take 1 tablet by mouth once daily, Disp: 90 tablet, Rfl: 0   omeprazole (PRILOSEC OTC) 20 MG tablet, Take 20 mg by mouth every morning., Disp: , Rfl:    OVER THE COUNTER MEDICATION, Take 30 mg by mouth 2 (two) times daily. Strengtia 30 mg, Disp: , Rfl:    OVER THE COUNTER MEDICATION, Take 1 tablet by mouth 2 (two) times daily. Hepato - C1, Disp: , Rfl:    OVER THE COUNTER MEDICATION, Take  1 capsule by mouth 2 (two) times daily. Omega CO3-SE, Disp: , Rfl:    Semaglutide, 2 MG/DOSE, (OZEMPIC, 2 MG/DOSE,) 8 MG/3ML SOPN, Inject 2 mg into the skin once a week., Disp: 9 mL, Rfl: 0   spironolactone (ALDACTONE) 50 MG tablet, Take one tablet by mouth on Monday, Tuesday, Thursday and Friday. (Patient taking differently: Take 50 mg by mouth See admin instructions. Take one tablet  (50 mg) by mouth on Monday, Tuesday, Thursday and Friday.), Disp: 90 tablet, Rfl: 3   tizanidine (ZANAFLEX) 2 MG capsule, Take 1 capsule (2 mg total) by mouth 3 (three) times daily as needed for muscle spasms., Disp: 60 capsule, Rfl: 1   topiramate (TOPAMAX) 50 MG tablet, Take 1 tablet (50 mg total) by mouth 2 (two) times daily., Disp: 180 tablet, Rfl: 3   traMADol (ULTRAM) 50 MG tablet, Take 1 tablet (50 mg total) by mouth every 6 (six) hours as needed. (Patient taking differently: Take 50 mg by mouth every 6 (six) hours as needed (pain).), Disp: 20 tablet, Rfl: 0   triamcinolone ointment (KENALOG) 0.1 %, Apply 1 Application topically 2 (two) times daily., Disp: 30 g, Rfl: 2   valsartan (DIOVAN) 160 MG tablet, Take 1 tablet (160 mg total) by mouth daily., Disp: 90 tablet, Rfl: 3   Vitamin E 180 MG CAPS, Take 180 mg by mouth every morning., Disp: , Rfl:    Allergies  Allergen Reactions   Hydralazine Itching   Meperidine Hcl Rash and Other (See Comments)    hallucinations   Aliskiren Hives   Aspirin Nausea Only   Juniper Berries Hives   Latex Hives   Rofecoxib Diarrhea    Reaction to Vioxx   Spironolactone Other (See Comments)    High doses caused nausea and rash - may can tolerate low doses   Chocolate Nausea And Vomiting and Rash   Furosemide Other (See Comments)    Rash and extreme dehydration   Oxycodone-Acetaminophen Itching and Rash   Sulfa Antibiotics Rash     Review of Systems  Constitutional: Negative.  Negative for fatigue.  Eyes:  Negative for blurred vision.  Respiratory: Negative.     Cardiovascular: Negative.  Negative for chest pain and palpitations.  Gastrointestinal: Negative.   Endocrine: Negative for polydipsia, polyphagia and polyuria.  Musculoskeletal:        Leg pain   Neurological: Negative.  Negative for dizziness and headaches.  Psychiatric/Behavioral: Negative.       Today's Vitals   09/28/21 1534  BP: 132/64  Pulse: 87  Temp: 98.4 F (36.9 C)  TempSrc: Oral  Weight: 208 lb 12.8 oz (94.7 kg)  Height: _0  (1.626 m)  PainSc: 6  PainLoc: Leg   Body mass index is 35.84 kg/m.  Wt Readings from Last 3 Encounters:  09/28/21 208 lb 12.8 oz (94.7 kg)  09/22/21 206 lb (93.4 kg)  08/25/21 205 lb (93 kg)     Objective:  Physical Exam Vitals reviewed.  Constitutional:      General: She is not in acute distress.    Appearance: Normal appearance. She is obese.  Eyes:     Pupils: Pupils are equal, round, and reactive to light.  Cardiovascular:     Rate and Rhythm: Normal rate and regular rhythm.     Pulses: Normal pulses.     Heart sounds: Normal heart sounds. No murmur heard. Pulmonary:     Effort: Pulmonary effort is normal. No respiratory distress.     Breath sounds: Normal breath sounds. No wheezing.  Musculoskeletal:        General: No swelling or tenderness. Normal range of motion.     Right lower leg: No edema.     Left lower leg: No edema.  Skin:    General: Skin is warm and dry.     Capillary Refill: Capillary refill takes less than 2 seconds.  Neurological:     General: No focal deficit present.     Mental Status: She is alert and oriented to person, place, and time.     Cranial Nerves: No cranial nerve deficit.     Motor: No weakness.  Psychiatric:        Mood and Affect: Mood normal.        Behavior: Behavior normal.        Thought Content: Thought content normal.        Judgment: Judgment normal.         Assessment And Plan:     1. Diabetes mellitus type 2 in obese [facility]) Comments: Well controlled diabetes. Continue  current medications.  - Hemoglobin A1c  2. Hypertensive heart disease with heart failure ([place]) Comments: Blood pressure is controlled, continue current medications.  - BMP8+EGFR  3. Mixed hyperlipidemia Comments: Stable cholesterol, continue statin, tolerating well.  - Lipid panel - BMP8+EGFR  4. Right sciatic nerve pain Comments: She has had continued pain to her right sciatic nerve and would like to see a Neurologist.  - Ambulatory referral to Neurology  5. Back spasm - tizanidine (ZANAFLEX) 2 MG capsule; Take 1 capsule (2 mg total) by mouth 3 (three) times daily as needed for muscle spasms.  Dispense: 60 capsule; Refill: 1  6. Rash and nonspecific skin eruption - triamcinolone ointment (KENALOG) 0.1 %; Apply 1 Application topically 2 (two) times daily.  Dispense: 30 g; Refill: 2  7. Obesity with body mass index (BMI) of 35.0 to 39.9 without comorbidity She is encouraged to strive for BMI less than 30 to decrease cardiac risk. Advised to aim for at least 150 minutes of exercise per week. Continue with follow up at [facility], she has good steady weight loss   Patient was given opportunity to ask questions. Patient verbalized understanding of the plan and was able to repeat key elements of the plan. All questions were answered to their satisfaction.  Brandi Brine, FNP   I, Brandi Brine, FNP, have reviewed all documentation for this visit. The documentation on 09/28/21 for the exam, diagnosis, procedures, and orders are all accurate and complete.   IF YOU HAVE BEEN REFERRED TO A SPECIALIST, IT MAY TAKE 1-2 WEEKS TO SCHEDULE/PROCESS THE REFERRAL. IF YOU HAVE NOT HEARD FROM US/SPECIALIST  IN TWO WEEKS, PLEASE GIVE [name] A CALL AT [phone].   THE PATIENT IS ENCOURAGED TO PRACTICE SOCIAL DISTANCING DUE TO THE COVID-19 PANDEMIC.

## 2021-09-28 NOTE — Patient Instructions (Signed)

## 2021-09-29 LAB — BMP8+EGFR
BUN/Creatinine Ratio: 22 (ref 12–28)
BUN: 20 mg/dL (ref 8–27)
CO2: 23 mmol/L (ref 20–29)
Calcium: 10 mg/dL (ref 8.7–10.3)
Chloride: 103 mmol/L (ref 96–106)
Creatinine, Ser: 0.93 mg/dL (ref 0.57–1.00)
Glucose: 87 mg/dL (ref 70–99)
Potassium: 4.2 mmol/L (ref 3.5–5.2)
Sodium: 139 mmol/L (ref 134–144)
eGFR: 67 mL/min/{1.73_m2} (ref >59)

## 2021-09-29 LAB — LIPID PANEL
Chol/HDL Ratio: 2.4 ratio (ref 0.0–4.4)
Cholesterol, Total: 111 mg/dL (ref 100–199)
HDL: 46 mg/dL (ref >39)
LDL Chol Calc (NIH): 49 mg/dL (ref 0–99)
Triglycerides: 79 mg/dL (ref 0–149)
VLDL Cholesterol Cal: 16 mg/dL (ref 5–40)

## 2021-09-29 LAB — HEMOGLOBIN A1C
Est. average glucose Bld gHb Est-mCnc: 105 mg/dL
Hgb A1c MFr Bld: 5.3 % (ref 4.8–5.6)

## 2021-10-14 ENCOUNTER — Encounter: Payer: Self-pay | Admitting: Nurse Practitioner

## 2021-10-17 ENCOUNTER — Ambulatory Visit: Payer: Medicare Other | Admitting: Nurse Practitioner

## 2021-10-20 ENCOUNTER — Ambulatory Visit (INDEPENDENT_AMBULATORY_CARE_PROVIDER_SITE_OTHER): Payer: Medicare Other | Admitting: Bariatrics

## 2021-10-28 ENCOUNTER — Other Ambulatory Visit: Payer: Self-pay | Admitting: Nurse Practitioner

## 2021-10-28 MED ORDER — TRAMADOL HCL 50 MG PO TABS: 50.0000 mg | ORAL_TABLET | Freq: Four times a day (QID) | ORAL | 0 refills | Status: AC | PRN

## 2021-11-02 ENCOUNTER — Encounter (INDEPENDENT_AMBULATORY_CARE_PROVIDER_SITE_OTHER): Payer: Self-pay

## 2021-11-14 ENCOUNTER — Ambulatory Visit (INDEPENDENT_AMBULATORY_CARE_PROVIDER_SITE_OTHER): Payer: Medicare Other | Admitting: Bariatrics

## 2021-11-14 ENCOUNTER — Encounter (INDEPENDENT_AMBULATORY_CARE_PROVIDER_SITE_OTHER): Payer: Self-pay

## 2021-11-14 ENCOUNTER — Other Ambulatory Visit (HOSPITAL_BASED_OUTPATIENT_CLINIC_OR_DEPARTMENT_OTHER): Payer: Self-pay

## 2021-11-14 ENCOUNTER — Encounter (INDEPENDENT_AMBULATORY_CARE_PROVIDER_SITE_OTHER): Payer: Self-pay | Admitting: Bariatrics

## 2021-11-14 VITALS — BP 124/82 | HR 79 | Temp 98.1°F | Ht 64.0 in | Wt 205.0 lb

## 2021-11-14 DIAGNOSIS — E669 Obesity, unspecified: Secondary | ICD-10-CM | POA: Diagnosis not present

## 2021-11-14 DIAGNOSIS — R7303 Prediabetes: Secondary | ICD-10-CM

## 2021-11-14 DIAGNOSIS — Z6835 Body mass index (BMI) 35.0-35.9, adult: Secondary | ICD-10-CM

## 2021-11-14 DIAGNOSIS — E7849 Other hyperlipidemia: Secondary | ICD-10-CM | POA: Diagnosis not present

## 2021-11-14 MED ORDER — OZEMPIC (2 MG/DOSE) 8 MG/3ML ~~LOC~~ SOPN
2.0000 mg | PEN_INJECTOR | SUBCUTANEOUS | 0 refills | Status: DC
  Filled 2021-11-14: qty 3, 28d supply, fill #0

## 2021-11-15 ENCOUNTER — Other Ambulatory Visit (HOSPITAL_BASED_OUTPATIENT_CLINIC_OR_DEPARTMENT_OTHER): Payer: Self-pay

## 2021-11-17 ENCOUNTER — Other Ambulatory Visit: Payer: Self-pay | Admitting: Nurse Practitioner

## 2021-11-17 ENCOUNTER — Other Ambulatory Visit (HOSPITAL_BASED_OUTPATIENT_CLINIC_OR_DEPARTMENT_OTHER): Payer: Self-pay

## 2021-11-17 DIAGNOSIS — Z1231 Encounter for screening mammogram for malignant neoplasm of breast: Secondary | ICD-10-CM

## 2021-11-21 NOTE — Progress Notes (Unsigned)
Chief Complaint:   OBESITY Brandi [name] is here to discuss her progress with her obesity treatment plan along with follow-up of her obesity related diagnoses. Brandi [name] is on the Category 1 Plan and states she is following her eating plan approximately 100% of the time. Brandi [name] states not currently exercising.  Today's visit was #: 56 Starting weight: 273 lbs Starting date: 08/25/2020 Today's weight: 205 lbs Today's date: 11/14/21 Total lbs lost to date: 65 Total lbs lost since last in-office visit: -1  Interim History: She is down 1 pound since her last visit but has done well overall.  Her ankles have been swelling but she has been eating more processed meat.  Subjective:   1. Pre-diabetes Taking Ozempic.  2. Other hyperlipidemia Taking Lipitor.  No myalgias.  Assessment/Plan:   1. Pre-diabetes Refill - Semaglutide, 2 MG/DOSE, (OZEMPIC, 2 MG/DOSE,) 8 MG/3ML SOPN; Inject 2 mg into the skin once a week.  Dispense: 3 mL; Refill: 0  2. Other hyperlipidemia 1.  Continue Lipitor. 2.  Increase MUFAs and PUFAs.  3. Obesity, Current BMI 35.3 1.  Will continue to adhere closely to to her plan. 2.  Intentional eating. 3.  Decrease sodium.  Brandi [name] is currently in the action stage of change. As such, her goal is to continue with weight loss efforts. She has agreed to the Category 1 Plan.   Exercise goals: Will walk as able with her cane.  Behavioral modification strategies: increasing lean protein intake, decreasing simple carbohydrates, increasing vegetables, increasing water intake, decreasing eating out, no skipping meals, meal planning and cooking strategies, ways to avoid boredom eating, and planning for success.  Brandi [name] has agreed to follow-up with our clinic in 4 weeks. She was informed of the importance of frequent follow-up visits to maximize her success with intensive lifestyle modifications for her multiple health conditions.  Objective:   Blood pressure 124/82, pulse 79,  temperature 98.1 F (36.7 C), height '5\' 4"'$  (1.626 m), weight 205 lb (93 kg), SpO2 99 %. Body mass index is 35.19 kg/m.  General: Cooperative, alert, well developed, in no acute distress.  Walking with a cane. HEENT: Conjunctivae and lids unremarkable. Cardiovascular: Regular rhythm.  Lungs: Normal work of breathing. Neurologic: No focal deficits.   Lab Results  Component Value Date   CREATININE 0.93 09/28/2021   BUN 20 09/28/2021   NA 139 09/28/2021   K 4.2 09/28/2021   CL 103 09/28/2021   CO2 23 09/28/2021   Lab Results  Component Value Date   ALT 14 06/14/2021   AST 15 06/14/2021   ALKPHOS 166 (H) 06/14/2021   BILITOT 0.3 06/14/2021   Lab Results  Component Value Date   HGBA1C 5.3 09/28/2021   HGBA1C 5.5 06/14/2021   HGBA1C 5.8 (H) 08/25/2020   HGBA1C 6.0 (H) 06/30/2020   HGBA1C 6.0 (H) 03/11/2020   Lab Results  Component Value Date   INSULIN 19.6 08/25/2020   Lab Results  Component Value Date   TSH 1.880 08/25/2020   Lab Results  Component Value Date   CHOL 111 09/28/2021   HDL 46 09/28/2021   LDLCALC 49 09/28/2021   TRIG 79 09/28/2021   CHOLHDL 2.4 09/28/2021   Lab Results  Component Value Date   VD25OH 48.4 06/14/2021   VD25OH 105.0 (H) 08/25/2020   Lab Results  Component Value Date   WBC 7.5 06/14/2021   HGB 12.6 06/14/2021   HCT 36.5 06/14/2021   MCV 91 06/14/2021   PLT 269 06/14/2021  No results found for: "IRON", "TIBC", "FERRITIN"  Attestation Statements:   Reviewed by clinician on day of visit: allergies, medications, problem list, medical history, surgical history, family history, social history, and previous encounter notes.  I, Dawn Whitmire, FNP-C, am acting as transcriptionist for Dr. [name].  I have reviewed the above documentation for accuracy and completeness, and I agree with the above. [name], DO

## 2021-11-22 NOTE — Progress Notes (Signed)
This encounter was created in error - please disregard.

## 2021-11-23 ENCOUNTER — Encounter (INDEPENDENT_AMBULATORY_CARE_PROVIDER_SITE_OTHER): Payer: Self-pay | Admitting: Bariatrics

## 2021-11-30 ENCOUNTER — Ambulatory Visit (INDEPENDENT_AMBULATORY_CARE_PROVIDER_SITE_OTHER): Payer: Medicare Other | Admitting: Neurology

## 2021-11-30 ENCOUNTER — Encounter: Payer: Self-pay | Admitting: Neurology

## 2021-11-30 VITALS — BP 140/95 | HR 83 | Ht 64.0 in | Wt 209.8 lb

## 2021-11-30 DIAGNOSIS — R200 Anesthesia of skin: Secondary | ICD-10-CM

## 2021-11-30 DIAGNOSIS — R202 Paresthesia of skin: Secondary | ICD-10-CM

## 2021-11-30 DIAGNOSIS — G3184 Mild cognitive impairment, so stated: Secondary | ICD-10-CM

## 2021-11-30 DIAGNOSIS — R269 Unspecified abnormalities of gait and mobility: Secondary | ICD-10-CM | POA: Diagnosis not present

## 2021-11-30 DIAGNOSIS — G5601 Carpal tunnel syndrome, right upper limb: Secondary | ICD-10-CM | POA: Diagnosis not present

## 2021-11-30 MED ORDER — TOPIRAMATE 50 MG PO TABS: 50.0000 mg | ORAL_TABLET | Freq: Two times a day (BID) | ORAL | 3 refills | Status: DC

## 2021-11-30 NOTE — Patient Instructions (Signed)
I had a long discussion with the patient and her daughter regarding her right hand pain and paresthesias from carpal tunnel syndrome, longstanding mild cognitive impairment gait difficulties and answered questions.  I recommend she wear wrist extension splint on her right wrist at all times and avoid activities which involve rapid repetitive wrist flexion.  Start Topamax 50 mg twice daily for pain and paresthesias and increased if needed as tolerated.  Referred to hand surgery for carpal tunnel surgery.  He was encouraged to continue participation in cognitively challenging activities like solving crossword puzzles, playing bridge and sudoku.  We also discussed memory compensation strategies.  I encouraged her to use a cane for ambulation at all times and discussed fall prevention precautions.  She will return for follow-up in the future in 3 months with my nurse practitioner Janett Billow or call earlier if necessary.  Carpal Tunnel Syndrome  Carpal tunnel syndrome is a condition that causes pain, numbness, and weakness in your hand and fingers. The carpal tunnel is a narrow area located on the palm side of your wrist. Repeated wrist motion or certain diseases may cause swelling within the tunnel. This swelling pinches the main nerve in the wrist. The main nerve in the wrist is called the median nerve. What are the causes? This condition may be caused by: Repeated and forceful wrist and hand motions. Wrist injuries. Arthritis. A cyst or tumor in the carpal tunnel. Fluid buildup during pregnancy. Use of tools that vibrate. Sometimes the cause of this condition is not known. What increases the risk? The following factors may make you more likely to develop this condition: Having a job that requires you to repeatedly or forcefully move your wrist or hand or requires you to use tools that vibrate. This may include jobs that involve using computers, working on an [employer], or working with [place] such  as Pension scheme manager. Being a woman. Having certain conditions, such as: Diabetes. Obesity. An underactive thyroid (hypothyroidism). Kidney failure. Rheumatoid arthritis. What are the signs or symptoms? Symptoms of this condition include: A tingling feeling in your fingers, especially in your thumb, index, and middle fingers. Tingling or numbness in your hand. An aching feeling in your entire arm, especially when your wrist and elbow are bent for a long time. Wrist pain that goes up your arm to your shoulder. Pain that goes down into your palm or fingers. A weak feeling in your hands. You may have trouble grabbing and holding items. Your symptoms may feel worse during the night. How is this diagnosed? This condition is diagnosed with a medical history and physical exam. You may also have tests, including: Electromyogram (EMG). This test measures electrical signals sent by your nerves into the muscles. Nerve conduction study. This test measures how well electrical signals pass through your nerves. Imaging tests, such as X-rays, ultrasound, and MRI. These tests check for possible causes of your condition. How is this treated? This condition may be treated with: Lifestyle changes. It is important to stop or change the activity that caused your condition. Doing exercise and activities to strengthen and stretch your muscles and tendons (physical therapy). Making lifestyle changes to help with your condition and learning how to do your daily activities safely (occupational therapy). Medicines for pain and inflammation. This may include medicine that is injected into your wrist. A wrist splint or brace. Surgery. Follow these instructions at home: If you have a splint or brace: Wear the splint or brace as told by your  health care provider. Remove it only as told by your health care provider. Loosen the splint or brace if your fingers tingle, become numb, or turn cold and blue. Keep the  splint or brace clean. If the splint or brace is not waterproof: Do not let it get wet. Cover it with a watertight covering when you take a bath or shower. Managing pain, stiffness, and swelling If directed, put ice on the painful area. To do this: If you have a removeable splint or brace, remove it as told by your health care provider. Put ice in a plastic bag. Place a towel between your skin and the bag or between the splint or brace and the bag. Leave the ice on for 20 minutes, 2-3 times a day. Do not fall asleep with the cold pack on your skin. Remove the ice if your skin turns bright red. This is very important. If you cannot feel pain, heat, or cold, you have a greater risk of damage to the area. Move your fingers often to reduce stiffness and swelling. General instructions Take over-the-counter and prescription medicines only as told by your health care provider. Rest your wrist and hand from any activity that may be causing your pain. If your condition is work related, talk with your employer about changes that can be made, such as getting a wrist pad to use while typing. Do any exercises as told by your health care provider, physical therapist, or occupational therapist. Keep all follow-up visits. This is important. Contact a health care provider if: You have new symptoms. Your pain is not controlled with medicines. Your symptoms get worse. Get help right away if: You have severe numbness or tingling in your wrist or hand. Summary Carpal tunnel syndrome is a condition that causes pain, numbness, and weakness in your hand and fingers. It is usually caused by repeated wrist motions. Lifestyle changes and medicines are used to treat carpal tunnel syndrome. Surgery may be recommended. Follow your health care provider's instructions about wearing a splint, resting from activity, keeping follow-up visits, and calling for help. This information is not intended to replace advice given to  you by your health care provider. Make sure you discuss any questions you have with your health care provider. Document Revised: 07/24/2019 Document Reviewed: 07/24/2019 Elsevier Patient Education  [employer].

## 2021-11-30 NOTE — Progress Notes (Signed)
[facility] [address] [place]. [place] [address] [phone]       OFFICE FOLLOW UP VISITNOTE  [name] Date of Birth:  24-Jul-1955 Medical Record Number:  [id number]   Referring MD: Brandi [name] Reason for Referral: Headaches   Chief Complaint  Patient presents with   New Patient (Initial Visit)    Rm 20. NX Dr. Leonie [name] and Brandi [name]-l/s 2022/internal referral for Right sciatic nerve pain.      HPI:   Today, 12/30/2020, Brandi [name] returns for 1-monthfollow-up accompanied by her daughter. Patient reports she has been doing well overall without any recent headaches and continued use of topiramate tolerating without side effects.  She does report having a fall approximately 1 month ago thankfully without injury.  She has been having more issues with sciatica and neuropathy and is planned to establish care with chronic conditions of [place].  Daughter is concerned regarding memory loss issues and worsening gait. She moved in with her daughter back in July.  She does endorse increased stressors over the past year. Previously discussed memory loss concerns with evaluation for reversible causes unremarkable.  No further concerns at this time     History provided for reference purposes only Update 06/23/2020 JM: Ms. MDiepreturns for 381-monthollow-up regarding chronic daily headaches and history of postconcussive syndrome  Since prior visit, headaches have greatly improved - she is not able to say when her last headache occurred  Currently on topiramate 50 mg twice daily - tolerating without side effects  Does report right hand numbness in the distribution of the median nerve that is typically worse upon awakening in the morning and worsens with certain activities. Reports prior dx of carpal tunnel " many years ago" and used to wear brace.  Denies any weakness or difficulty with right hand function.  No further concerns at this time  Update 02/25/2020  Dr. SeLeonie ManShe returns for follow-up after last visit more than a year ago.  She states memory and cognitive difficulties have improved.  She however still has almost daily headaches.  She describes headaches been fluctuating in severity from mild to severe but does not last long.  She takes 1 tablet of Tylenol which seems to work somewhat but the headache returns.  She denies any nausea vomiting light or sound sensitivity.  She is ableshe thinks has helped but she has not tried a higher dose.  She also takes gabapentin for sciatica but takes it only once a day as well as Requip 1 mg twice daily for restless legs which have not been bothering her recently.  She has no new complaints.  Update 12/04/2018 Dr. SeLeonie ManShe returns for follow-up after last visit 2 months ago.  She is accompanied by her daughter.  Patient states she is doing better.  She had only one fall recently when she felt dizzy and off-balance.  She uses a cane most of the time now and is careful when she gets up and turns.  She had MRI scan of the brain done on 10/31/2018 which was fairly unremarkable except for incidental empty sella.  EEG done on 10/09/2018 was normal.  Vitamin B12, TSH and RPR were normal on 09/26/2018.  Patient states Topamax that has started is helping hand pain and she is able to grip objects better she is not dropping them as much.  She feels her memory difficulties are unchanged.  She has however not been participating in activities for regular stress laxation.  She  complains of dizziness but has been taking both Flexeril and gabapentin 3 times daily and she is willing to reduce them as she feels she does not need them as much now.  Initial visit 09/26/2018 Dr. Leonie [name] Brandi [name] is a 66 year old pleasant Caucasian lady who is seen today for initial consultation visit.  She is accompanied by her daughter.  History is obtained from her and review of referral notes.  I have reviewed imaging films in PACS.  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  She states she fell without warm warning face forward and sustained bruises on her forehead and face.  She states she did not lose consciousness but daughter of states that she may have briefly lost it.  Patient was seen in the ER in the hospital where apparently brain scan was unremarkable however I do not have those hospital records for my review today.  Since then she has had constant headaches, tremors of her hands, short-term memory difficulties.  She states her balance is poor.  She has been using a walker at times and she can use a cane also and she wants to.  She is also been dropping objects from her hand intermittently.  She also complains of some occasional word finding difficulties and states her speech is slow.  This symptoms have continued and have not gotten better.  She had a CT scan of the head ordered by Dr. Ronnald [name] on 09/18/2018 which I personally reviewed shows no acute abnormality.  Patient states she was seeing a neurologist in [place] but I do not have those records either.  She does not remember having an MRI scan done.  She has also chronic back pain and sciatica and she takes Flexeril 10 mg 3 times daily, gabapentin 300 mg 3 times daily and tramadol as needed.  She also takes Requip 1 mg at night for restless legs.  Patient denies any significant increasing headaches, focal extremity weakness, stroke, TIA, seizures. to work despite the headache and sleeps well at night.  She has been taking Topamax 25 mg once a day which   Update 11/30/2021 : She returns for follow-up after last visit nearly a year ago.  She is accompanied by her daughter.  Patient states that her chronic daily headaches now seem to have resolved.  She has stopped taking Topamax and takes it only as needed.  She cannot remember the last time she had a significant headache.  She continues to have gait and balance difficulties but does use a cane.  She has  had no recent falls or injuries.  Her mild cognitive impairment and memory difficulties appear quite stable and unchanged.  She is still mostly independent and most activities of daily living.  Patient has a new complaint of right hand and finger pain and paresthesias.  She states this is quite bad and her carpal tunnel is acting up.  This is pretty severe mostly at night and she often wakes up in has trouble going back to sleep.  She states her radial 3 fingers are numb all the time.  She does have a wrist extension splint but does not wear it consistently and uses it mostly at night.  She denies any weakness in her hands or grip.  She is eating healthy and has lost about 40 pounds.   ROS:   14 system review of systems is positive for those listed in HPI and all  other systems negative  PMH:  Past Medical History:  Diagnosis Date   Anemia    Arthritis    Back pain    Edema, lower extremity    Food allergy    GERD (gastroesophageal reflux disease)    Hypertension    Hypertensive crisis    Joint pain    Morbid obesity ([place])    Osteoporosis    Pre-diabetes    Rheumatoid arthritis ([place])    Sciatica neuralgia    Sleep apnea     Social History:  Social History   Socioeconomic History   Marital status: Single    Spouse name: Not on file   Number of children: Not on file   Years of education: Not on file   Highest education level: Not on file  Occupational History   Occupation: Retired Medical illustrator Nurseb  Tobacco Use   Smoking status: Never   Smokeless tobacco: Never  Vaping Use   Vaping Use: Never used  Substance and Sexual Activity   Alcohol use: Not Currently   Drug use: Not Currently   Sexual activity: Not Currently  Other Topics Concern   Not on file  Social History Narrative   Lives alone   Right handed   Drinks no caffeine   Social Determinants of Health   Financial Resource Strain: Low Risk  (03/16/2021)   Overall Financial Resource Strain (CARDIA)     Difficulty of Paying Living Expenses: Not hard at all  Food Insecurity: No Food Insecurity (03/16/2021)   Hunger Vital Sign    Worried About Running Out of Food in the Last Year: Never true    [place] in the Last Year: Never true  Transportation Needs: No Transportation Needs (03/16/2021)   PRAPARE - Hydrologist (Medical): No    Lack of Transportation (Non-Medical): No  Physical Activity: Insufficiently Active (06/14/2021)   Exercise Vital Sign    Days of Exercise per Week: 2 days    Minutes of Exercise per Session: 40 min  Stress: No Stress Concern Present (03/16/2021)   [place]    Feeling of Stress : Not at all  Social Connections: Unknown (11/04/2018)   Social Connection and Isolation Panel [NHANES]    Frequency of Communication with Friends and Family: More than three times a week    Frequency of Social Gatherings with Friends and Family: More than three times a week    Attends Religious Services: 1 to 4 times per year    Active Member of [employer] or Organizations: No    Attends Archivist Meetings: Never    Marital Status: Not on file  Intimate Partner Violence: Not on file    Medications:   Current Outpatient Medications on File Prior to Visit  Medication Sig Dispense Refill   amLODipine (NORVASC) 5 MG tablet Take 1.5 tablets (7.5 mg total) by mouth daily. (Patient taking differently: Take 7.5 mg by mouth at bedtime.) 135 tablet 3   atorvastatin (LIPITOR) 40 MG tablet TAKE 1 TABLET BY MOUTH EVERY DAY (Patient taking differently: Take 40 mg by mouth every morning.) 90 tablet 3   Biotin 1000 MCG tablet Take 1,000 mcg by mouth every morning.     cetirizine (ZYRTEC) 10 MG tablet Take 10 mg by mouth every morning.     Cyanocobalamin (VITAMIN B-12 PO) Take 1 drop by mouth daily.     fluticasone (FLONASE) 50 MCG/ACT nasal spray 1 spray  daily as needed for allergies or  rhinitis.     Magnesium 500 MG TABS Take 500 mg by mouth at bedtime.     Nebivolol HCl (BYSTOLIC) 20 MG TABS Take 1 tablet by mouth once daily 90 tablet 0   omeprazole (PRILOSEC OTC) 20 MG tablet Take 20 mg by mouth every morning.     OVER THE COUNTER MEDICATION Take 30 mg by mouth 2 (two) times daily. Strengtia 30 mg     OVER THE COUNTER MEDICATION Take 1 tablet by mouth 2 (two) times daily. Hepato - C1     OVER THE COUNTER MEDICATION Take 1 capsule by mouth 2 (two) times daily. Omega CO3-SE     Semaglutide, 2 MG/DOSE, (OZEMPIC, 2 MG/DOSE,) 8 MG/3ML SOPN Inject 2 mg into the skin once a week. 3 mL 0   tizanidine (ZANAFLEX) 2 MG capsule Take 1 capsule (2 mg total) by mouth 3 (three) times daily as needed for muscle spasms. 60 capsule 1   traMADol (ULTRAM) 50 MG tablet Take 1 tablet (50 mg total) by mouth every 6 (six) hours as needed (pain). 20 tablet 0   triamcinolone ointment (KENALOG) 0.1 % Apply 1 Application topically 2 (two) times daily. 30 g 2   valsartan (DIOVAN) 160 MG tablet Take 1 tablet (160 mg total) by mouth daily. 90 tablet 3   Vitamin E 180 MG CAPS Take 180 mg by mouth every morning.     spironolactone (ALDACTONE) 50 MG tablet Take one tablet by mouth on Monday, Tuesday, Thursday and Friday. (Patient taking differently: Take 50 mg by mouth See admin instructions. Take one tablet  (50 mg) by mouth on Monday, Tuesday, Thursday and Friday.) 90 tablet 3   No current facility-administered medications on file prior to visit.    Allergies:   Allergies  Allergen Reactions   Hydralazine Itching   Meperidine Hcl Rash and Other (See Comments)    hallucinations   Aliskiren Hives   Aspirin Nausea Only   [place]uni[place] Berries Hives   Latex Hives   Rofecoxib Diarrhea    Reaction to Vioxx   Spironolactone Other (See Comments)    High doses caused nausea and rash - may can tolerate low doses   Chocolate Nausea And Vomiting and Rash   Furosemide Other (See Comments)    Rash and extreme  dehydration   Oxycodone-Acetaminophen Itching and Rash   Sulfa Antibiotics Rash    Physical Exam Today's Vitals   11/30/21 1436  BP: (!) 140/95  Pulse: 83  Weight: 209 lb 12.8 oz (95.2 kg)  Height: '5\' 4"'$  (1.626 m)    Body mass index is 36.01 kg/m.  General: Morbidly obese very pleasant middle-aged Caucasian lady, seated, in no evident distress Head: head normocephalic and atraumatic.   Neck: supple with no carotid or supraclavicular bruits Cardiovascular: regular rate and rhythm, no murmurs Musculoskeletal: no deformity Skin:  no rash/petichiae Vascular:  Normal pulses all extremities  Neurologic Exam Mental Status: Awake and fully alert. Oriented to place and time. Recent memory subjectively impaired and remote memory intact. Attention span, concentration and fund of knowledge appropriate. Mood and affect appropriate.  Mini-Mental status exam not done Cranial Nerves:  pupils equal, briskly reactive to light. Extraocular movements full without nystagmus. Visual fields full to confrontation. Hearing intact. Facial sensation intact. Face, tongue, palate moves normally and symmetrically.  Motor: Normal bulk and tone. Normal strength in all tested extremity muscles. Sensory.: intact to touch , pinprick , position and vibratory sensation.  Except  diminished touch pinprick all the right radial 3 fingers.  Tinel's sign is positive over right wrist. Coordination: Rapid alternating movements normal in all extremities. Finger-to-nose and heel-to-shin performed accurately bilaterally. Gait and Station: Arises from chair without difficulty.  Uses a cane.  Stance is normal. Gait demonstrates broad-based gait with normal stride length and balance and use of cane. heel, toe and tandem walk not attempted Reflexes: 1+ and symmetric except ankle jerks are depressed bilaterally. Toes downgoing.       ASSESSMENT/PLAN: 66 year old Caucasian lady with 2-year history of postconcussive syndrome with  chronic headache which have resolved, mild cognitive impairment and gait impairment which appears stable.  New complaints of right hand pain and paresthesias due to carpal tunnel syndrome.   I had a long discussion with the patient and her daughter regarding her right hand pain and paresthesias from carpal tunnel syndrome, longstanding mild cognitive impairment gait difficulties and answered questions.  I recommend she wear wrist extension splint on her right wrist at all times and avoid activities which involve rapid repetitive wrist flexion.  Check EMG nerve conduction study to confirm carpal tunnel start Topamax 50 mg twice daily for pain and paresthesias and increased if needed as tolerated.  Referred to hand surgery for carpal tunnel surgery.  He was encouraged to continue participation in cognitively challenging activities like solving crossword puzzles, playing bridge and sudoku.  We also discussed memory compensation strategies.  I encouraged her to use a cane for ambulation at all times and discussed fall prevention precautions.  She will return for follow-up in the future in 3 months with my nurse practitioner Janett Billow or call earlier if necessary.  Greater than 50% time during this prolonged 45-minute visit was spent on counseling and coordination of care about her right hand pain and carpal tunnel as well as discussion about her mild cognitive impairment and gait difficulty and answering questions  Antony Contras, MD  [facility] [address] [place], [place] [address]  Phone [phone] Fax [phone] Note: This document was prepared with digital dictation and possible smart phrase technology. Any transcriptional errors that result from this process are unintentional.

## 2021-12-01 ENCOUNTER — Telehealth: Payer: Self-pay | Admitting: Neurology

## 2021-12-01 NOTE — Telephone Encounter (Signed)
Referral sent to [facility] [phone]

## 2021-12-05 ENCOUNTER — Other Ambulatory Visit: Payer: Self-pay

## 2021-12-05 DIAGNOSIS — I10 Essential (primary) hypertension: Secondary | ICD-10-CM

## 2021-12-05 MED ORDER — VALSARTAN 160 MG PO TABS: 160.0000 mg | ORAL_TABLET | Freq: Every day | ORAL | 0 refills | Status: DC

## 2021-12-06 ENCOUNTER — Ambulatory Visit: Payer: Federal, State, Local not specified - PPO

## 2021-12-08 ENCOUNTER — Telehealth: Payer: Self-pay | Admitting: Neurology

## 2021-12-08 DIAGNOSIS — G5601 Carpal tunnel syndrome, right upper limb: Secondary | ICD-10-CM | POA: Insufficient documentation

## 2021-12-08 NOTE — Telephone Encounter (Signed)
Fax sent to [facility] for scheduling of NCV/EMG

## 2021-12-12 ENCOUNTER — Other Ambulatory Visit (HOSPITAL_BASED_OUTPATIENT_CLINIC_OR_DEPARTMENT_OTHER): Payer: Self-pay

## 2021-12-12 ENCOUNTER — Encounter: Payer: Self-pay | Admitting: Bariatrics

## 2021-12-12 ENCOUNTER — Other Ambulatory Visit: Payer: Self-pay | Admitting: Bariatrics

## 2021-12-12 ENCOUNTER — Ambulatory Visit (INDEPENDENT_AMBULATORY_CARE_PROVIDER_SITE_OTHER): Payer: Medicare Other | Admitting: Bariatrics

## 2021-12-12 VITALS — BP 134/85 | HR 71 | Temp 98.0°F | Ht 64.0 in | Wt 203.0 lb

## 2021-12-12 DIAGNOSIS — E669 Obesity, unspecified: Secondary | ICD-10-CM | POA: Diagnosis not present

## 2021-12-12 DIAGNOSIS — R7303 Prediabetes: Secondary | ICD-10-CM

## 2021-12-12 DIAGNOSIS — Z6834 Body mass index (BMI) 34.0-34.9, adult: Secondary | ICD-10-CM | POA: Diagnosis not present

## 2021-12-12 DIAGNOSIS — I10 Essential (primary) hypertension: Secondary | ICD-10-CM | POA: Diagnosis not present

## 2021-12-12 MED ORDER — OZEMPIC (2 MG/DOSE) 8 MG/3ML ~~LOC~~ SOPN
2.0000 mg | PEN_INJECTOR | SUBCUTANEOUS | 0 refills | Status: DC
  Filled 2021-12-12 (×2): qty 3, 28d supply, fill #0

## 2021-12-14 ENCOUNTER — Other Ambulatory Visit (HOSPITAL_BASED_OUTPATIENT_CLINIC_OR_DEPARTMENT_OTHER): Payer: Self-pay

## 2021-12-14 ENCOUNTER — Other Ambulatory Visit: Payer: Self-pay | Admitting: *Deleted

## 2021-12-14 NOTE — Progress Notes (Unsigned)
     Chief Complaint:   OBESITY Brandi [name] is here to discuss her progress with her obesity treatment plan along with follow-up of her obesity related diagnoses. Brandi [name] is on {MWMwtlossportion/plan2:23431} and states she is following her eating plan approximately ***% of the time. Brandi [name] states she is *** *** minutes *** times per week.  Today's visit was #: *** Starting weight: *** Starting date: *** Today's weight: *** Today's date: 12/12/2021 Total lbs lost to date: *** Total lbs lost since last in-office visit: ***  Interim History: ***  Subjective:   1. Pre-diabetes ***  2. Essential hypertension ***  Assessment/Plan:   1. Pre-diabetes *** - Semaglutide, 2 MG/DOSE, (OZEMPIC, 2 MG/DOSE,) 8 MG/3ML SOPN; Inject 2 mg into the skin once a week.  Dispense: 3 mL; Refill: 0  2. Essential hypertension ***  3. Obesity, Current BMI 34.9 Brandi [name] {CHL AMB IS/IS NOT:210130109} currently in the action stage of change. As such, her goal is to {MWMwtloss#1:210800005}. She has agreed to {MWMwtlossportion/plan2:23431}.   Exercise goals: {MWM EXERCISE RECS:23473}  Behavioral modification strategies: {MWMwtlossdietstrategies3:23432}.  Brandi [name] has agreed to follow-up with our clinic in {NUMBER 1-10:22536} weeks. She was informed of the importance of frequent follow-up visits to maximize her success with intensive lifestyle modifications for her multiple health conditions.   Objective:   Blood pressure 134/85, pulse 71, temperature 98 F (36.7 C), height '5\' 4"'$  (1.626 m), weight 203 lb (92.1 kg), SpO2 100 %. Body mass index is 34.84 kg/m.  General: Cooperative, alert, well developed, in no acute distress. HEENT: Conjunctivae and lids unremarkable. Cardiovascular: Regular rhythm.  Lungs: Normal work of breathing. Neurologic: No focal deficits.   Lab Results  Component Value Date   CREATININE 0.93 09/28/2021   BUN 20 09/28/2021   NA 139 09/28/2021   K 4.2 09/28/2021   CL 103 09/28/2021    CO2 23 09/28/2021   Lab Results  Component Value Date   ALT 14 06/14/2021   AST 15 06/14/2021   ALKPHOS 166 (H) 06/14/2021   BILITOT 0.3 06/14/2021   Lab Results  Component Value Date   HGBA1C 5.3 09/28/2021   HGBA1C 5.5 06/14/2021   HGBA1C 5.8 (H) 08/25/2020   HGBA1C 6.0 (H) 06/30/2020   HGBA1C 6.0 (H) 03/11/2020   Lab Results  Component Value Date   INSULIN 19.6 08/25/2020   Lab Results  Component Value Date   TSH 1.880 08/25/2020   Lab Results  Component Value Date   CHOL 111 09/28/2021   HDL 46 09/28/2021   LDLCALC 49 09/28/2021   TRIG 79 09/28/2021   CHOLHDL 2.4 09/28/2021   Lab Results  Component Value Date   VD25OH 48.4 06/14/2021   VD25OH 105.0 (H) 08/25/2020   Lab Results  Component Value Date   WBC 7.5 06/14/2021   HGB 12.6 06/14/2021   HCT 36.5 06/14/2021   MCV 91 06/14/2021   PLT 269 06/14/2021   No results found for: "IRON", "TIBC", "FERRITIN"  Attestation Statements:   Reviewed by clinician on day of visit: allergies, medications, problem list, medical history, surgical history, family history, social history, and previous encounter notes.   Wilhemena Durie, am acting as Location manager for [employer], DO.  I have reviewed the above documentation for accuracy and completeness, and I agree with the above. -  ***

## 2021-12-15 ENCOUNTER — Encounter: Payer: Self-pay | Admitting: Bariatrics

## 2021-12-20 ENCOUNTER — Other Ambulatory Visit: Payer: Self-pay

## 2021-12-20 MED ORDER — ATORVASTATIN CALCIUM 40 MG PO TABS: 40.0000 mg | ORAL_TABLET | Freq: Every morning | ORAL | 1 refills | Status: DC

## 2021-12-22 ENCOUNTER — Other Ambulatory Visit: Payer: Self-pay

## 2021-12-22 DIAGNOSIS — I10 Essential (primary) hypertension: Secondary | ICD-10-CM

## 2021-12-22 MED ORDER — AMLODIPINE BESYLATE 5 MG PO TABS: 7.5000 mg | ORAL_TABLET | Freq: Every day | ORAL | 0 refills | Status: DC

## 2021-12-23 ENCOUNTER — Ambulatory Visit
Admission: RE | Admit: 2021-12-23 | Discharge: 2021-12-23 | Disposition: A | Discharge status: Home / Self Care | Payer: Federal, State, Local not specified - PPO | Source: Ambulatory Visit | Attending: Nurse Practitioner | Admitting: Nurse Practitioner

## 2021-12-23 DIAGNOSIS — Z1231 Encounter for screening mammogram for malignant neoplasm of breast: Secondary | ICD-10-CM

## 2021-12-26 DIAGNOSIS — Z135 Encounter for screening for eye and ear disorders: Secondary | ICD-10-CM | POA: Diagnosis not present

## 2021-12-26 DIAGNOSIS — H2513 Age-related nuclear cataract, bilateral: Secondary | ICD-10-CM | POA: Diagnosis not present

## 2021-12-26 DIAGNOSIS — H52223 Regular astigmatism, bilateral: Secondary | ICD-10-CM | POA: Diagnosis not present

## 2021-12-26 DIAGNOSIS — H524 Presbyopia: Secondary | ICD-10-CM | POA: Diagnosis not present

## 2021-12-27 ENCOUNTER — Ambulatory Visit (INDEPENDENT_AMBULATORY_CARE_PROVIDER_SITE_OTHER): Payer: Medicare Other | Admitting: Nurse Practitioner

## 2021-12-27 ENCOUNTER — Encounter: Payer: Self-pay | Admitting: Nurse Practitioner

## 2021-12-27 VITALS — BP 130/72 | HR 96 | Temp 98.2°F | Ht 64.0 in | Wt 206.2 lb

## 2021-12-27 DIAGNOSIS — Z6835 Body mass index (BMI) 35.0-35.9, adult: Secondary | ICD-10-CM | POA: Diagnosis not present

## 2021-12-27 DIAGNOSIS — Z23 Encounter for immunization: Secondary | ICD-10-CM | POA: Diagnosis not present

## 2021-12-27 DIAGNOSIS — N7689 Other specified inflammation of vagina and vulva: Secondary | ICD-10-CM | POA: Diagnosis not present

## 2021-12-27 DIAGNOSIS — E669 Obesity, unspecified: Secondary | ICD-10-CM

## 2021-12-27 MED ORDER — MUPIROCIN 2 % EX OINT: 1.0000 | TOPICAL_OINTMENT | Freq: Every day | CUTANEOUS | 0 refills | Status: AC

## 2021-12-27 MED ORDER — CEFTRIAXONE SODIUM 500 MG IJ SOLR
500.0000 mg | Freq: Once | INTRAMUSCULAR | Status: AC
  Administered 2021-12-27: 500 mg via INTRAMUSCULAR

## 2021-12-27 MED ORDER — DOXYCYCLINE MONOHYDRATE 100 MG PO CAPS: 100.0000 mg | ORAL_CAPSULE | Freq: Two times a day (BID) | ORAL | 0 refills | Status: DC

## 2021-12-27 NOTE — Patient Instructions (Addendum)
Skin Abscess  A skin abscess is an infected area of your skin that contains pus and other material. An abscess can happen in any part of your body. Some abscesses break open (rupture) on their own. Most continue to get worse unless they are treated. The infection can spread deeper into the body and into your blood, which can make you feel sick. A skin abscess is caused by germs that enter the skin through a cut or scrape. It can also be caused by blocked oil and sweat glands or infected hair follicles. This condition is usually treated by: Draining the pus. Taking antibiotic medicines. Placing a warm, wet washcloth over the abscess. Follow these instructions at home: Medicines  Take over-the-counter and prescription medicines only as told by your doctor. If you were prescribed an antibiotic medicine, take it as told by your doctor. Do not stop taking the antibiotic even if you start to feel better. Abscess care  If you have an abscess that has not drained, place a warm, clean, wet washcloth over the abscess several times a day. Do this as told by your doctor. Follow instructions from your doctor about how to take care of your abscess. Make sure you: Cover the abscess with a bandage (dressing). Change your bandage or gauze as told by your doctor. Wash your hands with soap and water before you change the bandage or gauze. If you cannot use soap and water, use hand sanitizer. Check your abscess every day for signs that the infection is getting worse. Check for: More redness, swelling, or pain. More fluid or blood. Warmth. More pus or a bad smell. General instructions To avoid spreading the infection: Do not share personal care items, towels, or hot tubs with others. Avoid making skin-to-skin contact with other people. Keep all follow-up visits as told by your doctor. This is important. Contact a doctor if: You have more redness, swelling, or pain around your abscess. You have more fluid  or blood coming from your abscess. Your abscess feels warm when you touch it. You have more pus or a bad smell coming from your abscess. Your muscles ache. You feel sick. Get help right away if: You have very bad (severe) pain. You see red streaks on your skin spreading away from the abscess. You see redness that spreads quickly. You have a fever or chills. Summary A skin abscess is an infected area of your skin that contains pus and other material. The abscess is caused by germs that enter the skin through a cut or scrape. It can also be caused by blocked oil and sweat glands or infected hair follicles. Follow your doctor's instructions on caring for your abscess, taking medicines, preventing infections, and keeping follow-up visits. This information is not intended to replace advice given to you by your health care provider. Make sure you discuss any questions you have with your health care provider. Document Revised: 06/16/2021 Document Reviewed: 12/20/2020 Elsevier Patient Education  [place].   Use warm compresses to vaginal area 3-4 times a day and take doxycline twice a day for 10 days. I will call you later today to let you know next steps.   Cleanse area with Hibiclens and apply mupirocin return in

## 2021-12-27 NOTE — Progress Notes (Signed)
Brandi [name],acting as a Education administrator for Brandi Brine, FNP.,have documented all relevant documentation on the behalf of Brandi Brine, FNP,as directed by  Brandi Brine, FNP while in the presence of Brandi [name], Brandi [name].    Subjective:     Patient ID: Brandi [name] , female    DOB: October 24, 1955 , 66 y.o.   MRN: [id number]   Chief Complaint  Patient presents with   skin abcess     HPI  Pt presents today for boil on her private area. Patient states she has a boil on the right side of her labia noticed on Friday and on Sunday was as big as a walnut, she states it burt yesterday and had bloody drainage. The area opened on its own. She states today it is still draining but foul smelling and very painful. She also wants her ears flushed.  BP Readings from Last 3 Encounters: 12/27/21 : 130/72 12/12/21 : 134/85 11/30/21 : (!) 140/95       Past Medical History:  Diagnosis Date   Anemia    Arthritis    Back pain    Edema, lower extremity    Food allergy    GERD (gastroesophageal reflux disease)    Hypertension    Hypertensive crisis    Joint pain    Morbid obesity ([place])    Osteoporosis    Pre-diabetes    Rheumatoid arthritis ([place])    Sciatica neuralgia    Sleep apnea      Family History  Problem Relation Age of Onset   Hypertension Mother    Cancer Mother    Heart disease Mother    Eating disorder Mother    Obesity Mother    Kidney disease Father    Heart disease Father    Diabetes Father    High blood pressure Father    High Cholesterol Father    Anxiety disorder Father    Bipolar disorder Father    Schizophrenia Father    Hypertension Sister      Current Outpatient Medications:    amLODipine (NORVASC) 5 MG tablet, Take 1.5 tablets (7.5 mg total) by mouth daily., Disp: 135 tablet, Rfl: 0   atorvastatin (LIPITOR) 40 MG tablet, Take 1 tablet (40 mg total) by mouth every morning., Disp: 90 tablet, Rfl: 1   Biotin 1000 MCG tablet, Take 1,000 mcg by mouth every morning.,  Disp: , Rfl:    cetirizine (ZYRTEC) 10 MG tablet, Take 10 mg by mouth every morning., Disp: , Rfl:    Cyanocobalamin (VITAMIN B-12 PO), Take 1 drop by mouth daily., Disp: , Rfl:    doxycycline (MONODOX) 100 MG capsule, Take 1 capsule (100 mg total) by mouth 2 (two) times daily., Disp: 20 capsule, Rfl: 0   fluticasone (FLONASE) 50 MCG/ACT nasal spray, 1 spray daily as needed for allergies or rhinitis., Disp: , Rfl:    Magnesium 500 MG TABS, Take 500 mg by mouth at bedtime., Disp: , Rfl:    mupirocin ointment (BACTROBAN) 2 %, Apply 1 Application topically daily., Disp: 22 g, Rfl: 0   Nebivolol HCl (BYSTOLIC) 20 MG TABS, Take 1 tablet by mouth once daily, Disp: 90 tablet, Rfl: 0   omeprazole (PRILOSEC OTC) 20 MG tablet, Take 20 mg by mouth every morning., Disp: , Rfl:    OVER THE COUNTER MEDICATION, Take 30 mg by mouth 2 (two) times daily. Strengtia 30 mg, Disp: , Rfl:    OVER THE COUNTER MEDICATION, Take 1 tablet by mouth 2 (two) times  daily. Hepato - C1, Disp: , Rfl:    OVER THE COUNTER MEDICATION, Take 1 capsule by mouth 2 (two) times daily. Omega CO3-SE, Disp: , Rfl:    Semaglutide, 2 MG/DOSE, (OZEMPIC, 2 MG/DOSE,) 8 MG/3ML SOPN, Inject 2 mg into the skin once a week., Disp: 3 mL, Rfl: 0   tizanidine (ZANAFLEX) 2 MG capsule, Take 1 capsule (2 mg total) by mouth 3 (three) times daily as needed for muscle spasms., Disp: 60 capsule, Rfl: 1   topiramate (TOPAMAX) 50 MG tablet, Take 1 tablet (50 mg total) by mouth 2 (two) times daily., Disp: 180 tablet, Rfl: 3   traMADol (ULTRAM) 50 MG tablet, Take 1 tablet (50 mg total) by mouth every 6 (six) hours as needed (pain)., Disp: 20 tablet, Rfl: 0   triamcinolone ointment (KENALOG) 0.1 %, Apply 1 Application topically 2 (two) times daily., Disp: 30 g, Rfl: 2   valsartan (DIOVAN) 160 MG tablet, Take 1 tablet (160 mg total) by mouth daily., Disp: 90 tablet, Rfl: 0   Vitamin E 180 MG CAPS, Take 180 mg by mouth every morning., Disp: , Rfl:    Allergies   Allergen Reactions   Hydralazine Itching   Meperidine Hcl Rash and Other (See Comments)    hallucinations   Aliskiren Hives   Aspirin Nausea Only   Juniper Berries Hives   Latex Hives   Rofecoxib Diarrhea    Reaction to Vioxx   Spironolactone Other (See Comments)    High doses caused nausea and rash - may can tolerate low doses   Chocolate Nausea And Vomiting and Rash   Furosemide Other (See Comments)    Rash and extreme dehydration   Oxycodone-Acetaminophen Itching and Rash   Sulfa Antibiotics Rash     Review of Systems  Constitutional: Negative.  Negative for chills and fever.  Respiratory: Negative.    Cardiovascular: Negative.   Genitourinary:  Positive for vaginal pain.  Skin:        Boil to right labia  Neurological: Negative.   Psychiatric/Behavioral: Negative.       Today's Vitals   12/27/21 1104  BP: 130/72  Pulse: 96  Temp: 98.2 F (36.8 C)  TempSrc: Oral  Weight: 206 lb 3.2 oz (93.5 kg)  Height: '5\' 4"'$  (1.626 m)  PainSc: 6    Body mass index is 35.39 kg/m.  Wt Readings from Last 3 Encounters:  12/27/21 206 lb 3.2 oz (93.5 kg)  12/12/21 203 lb (92.1 kg)  11/30/21 209 lb 12.8 oz (95.2 kg)    Objective:  Physical Exam Vitals reviewed.  Constitutional:      General: She is not in acute distress.    Appearance: Normal appearance. She is obese.  HENT:     Right Ear: Tympanic membrane, ear canal and external ear normal. There is no impacted cerumen.     Left Ear: Tympanic membrane, ear canal and external ear normal. There is no impacted cerumen.  Eyes:     Pupils: Pupils are equal, round, and reactive to light.  Cardiovascular:     Rate and Rhythm: Normal rate and regular rhythm.     Pulses: Normal pulses.     Heart sounds: Normal heart sounds. No murmur heard. Pulmonary:     Effort: Pulmonary effort is normal. No respiratory distress.     Breath sounds: Normal breath sounds. No wheezing.  Genitourinary:      Comments: Right labia majora with  abscess present with opening possible tunneling, minimal exudate.  Musculoskeletal:  General: No swelling or tenderness. Normal range of motion.     Right lower leg: No edema.     Left lower leg: No edema.  Skin:    General: Skin is warm and dry.     Capillary Refill: Capillary refill takes less than 2 seconds.  Neurological:     General: No focal deficit present.     Mental Status: She is alert and oriented to person, place, and time.     Cranial Nerves: No cranial nerve deficit.     Motor: No weakness.  Psychiatric:        Mood and Affect: Mood normal.        Behavior: Behavior normal.        Thought Content: Thought content normal.        Judgment: Judgment normal.         Assessment And Plan:     1. Boil, vagina Comments: Present to right labia majora with open area appears to be tunneling, administered Rocephin 500 mg and treated with Doxycycline. Called to wound center and recommend for patient to use hibiclens daily and apply mupiciron. She is to wear a pad to help with drainage/leakage. Patient to return on Thursday to reevaluate. Also scheduled an appt with [place] on Oct 13th at 920a if better will cancel.  - cefTRIAXone (ROCEPHIN) injection 500 mg - doxycycline (MONODOX) 100 MG capsule; Take 1 capsule (100 mg total) by mouth 2 (two) times daily.  Dispense: 20 capsule; Refill: 0 - Culture, Wound - mupirocin ointment (BACTROBAN) 2 %; Apply 1 Application topically daily.  Dispense: 22 g; Refill: 0  2. Morbid obesity ([place]) Comments: [facility]. She is doing well with her weight loss  3. Obesity with body mass index (BMI) of 35.0 to 39.9 without comorbidity Comments: [facility] to [facility].  She is encouraged to strive for BMI less than 30 to decrease cardiac risk. Advised to aim for at least 150 minutes of exercise per week.  4. Need for influenza vaccination Influenza vaccine administered Encouraged to  take Tylenol as needed for fever or muscle aches - Flu Vaccine QUAD High Dose(Fluad)   Spoke with Bobbie at Wound center has patient on schedule for Friday 13 at 945am if better can cancel appt.  Patient was given opportunity to ask questions. Patient verbalized understanding of the plan and was able to repeat key elements of the plan. All questions were answered to their satisfaction.  Brandi Brine, FNP   I, Brandi Brine, FNP, have reviewed all documentation for this visit. The documentation on 12/27/21 for the exam, diagnosis, procedures, and orders are all accurate and complete.   IF YOU HAVE BEEN REFERRED TO A SPECIALIST, IT MAY TAKE 1-2 WEEKS TO SCHEDULE/PROCESS THE REFERRAL. IF YOU HAVE NOT HEARD FROM US/SPECIALIST IN TWO WEEKS, PLEASE GIVE [name] A CALL AT [phone].   THE PATIENT IS ENCOURAGED TO PRACTICE SOCIAL DISTANCING DUE TO THE COVID-19 PANDEMIC.

## 2021-12-29 ENCOUNTER — Encounter: Payer: Self-pay | Admitting: Nurse Practitioner

## 2021-12-29 ENCOUNTER — Ambulatory Visit (INDEPENDENT_AMBULATORY_CARE_PROVIDER_SITE_OTHER): Payer: Medicare Other | Admitting: Nurse Practitioner

## 2021-12-29 VITALS — BP 130/80 | HR 78 | Temp 98.4°F | Wt 207.0 lb

## 2021-12-29 DIAGNOSIS — G5601 Carpal tunnel syndrome, right upper limb: Secondary | ICD-10-CM | POA: Diagnosis not present

## 2021-12-29 DIAGNOSIS — N7689 Other specified inflammation of vagina and vulva: Secondary | ICD-10-CM | POA: Diagnosis not present

## 2021-12-29 DIAGNOSIS — G609 Hereditary and idiopathic neuropathy, unspecified: Secondary | ICD-10-CM | POA: Diagnosis not present

## 2021-12-29 DIAGNOSIS — G5621 Lesion of ulnar nerve, right upper limb: Secondary | ICD-10-CM | POA: Diagnosis not present

## 2021-12-29 NOTE — Patient Instructions (Signed)
Skin Abscess  A skin abscess is an infected area of your skin that contains pus and other material. An abscess can happen in any part of your body. Some abscesses break open (rupture) on their own. Most continue to get worse unless they are treated. The infection can spread deeper into the body and into your blood, which can make you feel sick. A skin abscess is caused by germs that enter the skin through a cut or scrape. It can also be caused by blocked oil and sweat glands or infected hair follicles. This condition is usually treated by: Draining the pus. Taking antibiotic medicines. Placing a warm, wet washcloth over the abscess. Follow these instructions at home: Medicines  Take over-the-counter and prescription medicines only as told by your doctor. If you were prescribed an antibiotic medicine, take it as told by your doctor. Do not stop taking the antibiotic even if you start to feel better. Abscess care  If you have an abscess that has not drained, place a warm, clean, wet washcloth over the abscess several times a day. Do this as told by your doctor. Follow instructions from your doctor about how to take care of your abscess. Make sure you: Cover the abscess with a bandage (dressing). Change your bandage or gauze as told by your doctor. Wash your hands with soap and water before you change the bandage or gauze. If you cannot use soap and water, use hand sanitizer. Check your abscess every day for signs that the infection is getting worse. Check for: More redness, swelling, or pain. More fluid or blood. Warmth. More pus or a bad smell. General instructions To avoid spreading the infection: Do not share personal care items, towels, or hot tubs with others. Avoid making skin-to-skin contact with other people. Keep all follow-up visits as told by your doctor. This is important. Contact a doctor if: You have more redness, swelling, or pain around your abscess. You have more fluid  or blood coming from your abscess. Your abscess feels warm when you touch it. You have more pus or a bad smell coming from your abscess. Your muscles ache. You feel sick. Get help right away if: You have very bad (severe) pain. You see red streaks on your skin spreading away from the abscess. You see redness that spreads quickly. You have a fever or chills. Summary A skin abscess is an infected area of your skin that contains pus and other material. The abscess is caused by germs that enter the skin through a cut or scrape. It can also be caused by blocked oil and sweat glands or infected hair follicles. Follow your doctor's instructions on caring for your abscess, taking medicines, preventing infections, and keeping follow-up visits. This information is not intended to replace advice given to you by your health care provider. Make sure you discuss any questions you have with your health care provider. Document Revised: 06/16/2021 Document Reviewed: 12/20/2020 Elsevier Patient Education  2023 Elsevier Inc.  

## 2021-12-29 NOTE — Progress Notes (Signed)
I,Tianna Badgett,acting as a Education administrator for [employer], FNP.,have documented all relevant documentation on the behalf of [name], FNP,as directed by  [name], FNP while in the presence of [name], [place].  Subjective:     Patient ID: Brandi [name] , female    DOB: 08-14-1955 , 66 y.o.   MRN: [id number]   Chief Complaint  Patient presents with   Wound Check    HPI  Pt presents today for follow up for boil on her labia. She feels is a little better but feels like needs to be drained.   Wound Check She was originally treated 3 to 5 days ago. Previous treatment included oral antibiotics. There has been no drainage from the wound. There is no redness present. The swelling has improved. There is no pain present. She has no difficulty moving the affected extremity or digit.     Past Medical History:  Diagnosis Date   Anemia    Arthritis    Back pain    Edema, lower extremity    Food allergy    GERD (gastroesophageal reflux disease)    Hypertension    Hypertensive crisis    Joint pain    Morbid obesity ([place])    Osteoporosis    Pre-diabetes    Rheumatoid arthritis ([place])    Sciatica neuralgia    Sleep apnea      Family History  Problem Relation Age of Onset   Hypertension Mother    Cancer Mother    Heart disease Mother    Eating disorder Mother    Obesity Mother    Kidney disease Father    Heart disease Father    Diabetes Father    High blood pressure Father    High Cholesterol Father    Anxiety disorder Father    Bipolar disorder Father    Schizophrenia Father    Hypertension Sister      Current Outpatient Medications:    amLODipine (NORVASC) 5 MG tablet, Take 1.5 tablets (7.5 mg total) by mouth daily., Disp: 135 tablet, Rfl: 0   atorvastatin (LIPITOR) 20 MG tablet, Take 1 tablet (20 mg total) by mouth daily., Disp: 90 tablet, Rfl: 3   Biotin 1000 MCG tablet, Take 1,000 mcg by mouth every morning., Disp: , Rfl:    cetirizine (ZYRTEC) 10 MG tablet, Take  10 mg by mouth every morning., Disp: , Rfl:    Cyanocobalamin (VITAMIN B-12 PO), Take 1 drop by mouth daily., Disp: , Rfl:    doxycycline (MONODOX) 100 MG capsule, Take 1 capsule (100 mg total) by mouth 2 (two) times daily., Disp: 20 capsule, Rfl: 0   fluticasone (FLONASE) 50 MCG/ACT nasal spray, 1 spray daily as needed for allergies or rhinitis., Disp: , Rfl:    Magnesium 500 MG TABS, Take 500 mg by mouth at bedtime., Disp: , Rfl:    mupirocin ointment (BACTROBAN) 2 %, Apply 1 Application topically daily., Disp: 22 g, Rfl: 0   Nebivolol HCl (BYSTOLIC) 20 MG TABS, Take 1 tablet by mouth once daily, Disp: 90 tablet, Rfl: 0   omeprazole (PRILOSEC OTC) 20 MG tablet, Take 20 mg by mouth every morning., Disp: , Rfl:    OVER THE COUNTER MEDICATION, Take 30 mg by mouth 2 (two) times daily. Strengtia 30 mg, Disp: , Rfl:    OVER THE COUNTER MEDICATION, Take 1 tablet by mouth 2 (two) times daily. Hepato - C1, Disp: , Rfl:    OVER THE COUNTER MEDICATION, Take 1 capsule by  mouth 2 (two) times daily. Omega CO3-SE, Disp: , Rfl:    Semaglutide, 2 MG/DOSE, (OZEMPIC, 2 MG/DOSE,) 8 MG/3ML SOPN, Inject 2 mg into the skin once a week., Disp: 3 mL, Rfl: 0   tizanidine (ZANAFLEX) 2 MG capsule, Take 1 capsule (2 mg total) by mouth 3 (three) times daily as needed for muscle spasms., Disp: 60 capsule, Rfl: 1   topiramate (TOPAMAX) 50 MG tablet, Take 1 tablet (50 mg total) by mouth 2 (two) times daily., Disp: 180 tablet, Rfl: 3   traMADol (ULTRAM) 50 MG tablet, Take 1 tablet (50 mg total) by mouth every 6 (six) hours as needed (pain)., Disp: 20 tablet, Rfl: 0   triamcinolone ointment (KENALOG) 0.1 %, Apply 1 Application topically 2 (two) times daily., Disp: 30 g, Rfl: 2   valsartan (DIOVAN) 160 MG tablet, Take 1 tablet (160 mg total) by mouth daily., Disp: 90 tablet, Rfl: 0   Vitamin E 180 MG CAPS, Take 180 mg by mouth every morning., Disp: , Rfl:    Allergies  Allergen Reactions   Hydralazine Itching   Meperidine Hcl  Rash and Other (See Comments)    hallucinations   Aliskiren Hives   Aspirin Nausea Only   Juniper Berries Hives   Latex Hives   Rofecoxib Diarrhea    Reaction to Vioxx   Spironolactone Other (See Comments)    High doses caused nausea and rash - may can tolerate low doses   Chocolate Nausea And Vomiting and Rash   Furosemide Other (See Comments)    Rash and extreme dehydration   Oxycodone-Acetaminophen Itching and Rash   Sulfa Antibiotics Rash     Review of Systems  Constitutional: Negative.   Respiratory: Negative.    Cardiovascular: Negative.   Gastrointestinal: Negative.   Skin:  Positive for wound.  Neurological: Negative.      Today's Vitals   12/29/21 0858  BP: 130/80  Pulse: 78  Temp: 98.4 F (36.9 C)  TempSrc: Oral  Weight: 207 lb (93.9 kg)   Body mass index is 35.53 kg/m.   Objective:  Physical Exam Vitals reviewed.  Constitutional:      General: She is not in acute distress.    Appearance: Normal appearance.  Pulmonary:     Effort: Pulmonary effort is normal. No respiratory distress.  Genitourinary:    Comments: Boil that was in right labia is improved, no drainage noted.  Skin:    General: Skin is warm.  Neurological:     General: No focal deficit present.     Mental Status: She is alert and oriented to person, place, and time.     Cranial Nerves: No cranial nerve deficit.         Assessment And Plan:     1. Boil, vagina Area is improving, she has an appt on October 13th. Still awaiting her wound culture results. Continue to keep clean with Hibcl[place].    Patient was given opportunity to ask questions. Patient verbalized understanding of the plan and was able to repeat key elements of the plan. All questions were answered to their satisfaction.  [name], FNP   I, [name], FNP, have reviewed all documentation for this visit. The documentation on 12/29/21 for the exam, diagnosis, procedures, and orders are all accurate and complete.    IF YOU HAVE BEEN REFERRED TO A SPECIALIST, IT MAY TAKE 1-2 WEEKS TO SCHEDULE/PROCESS THE REFERRAL. IF YOU HAVE NOT HEARD FROM US/SPECIALIST IN TWO WEEKS, PLEASE GIVE [name] A CALL AT  [phone].   THE PATIENT IS ENCOURAGED TO PRACTICE SOCIAL DISTANCING DUE TO THE COVID-19 PANDEMIC.

## 2021-12-30 LAB — WOUND CULTURE

## 2022-01-04 NOTE — Progress Notes (Signed)
Cardiology Office Note:    Date:  01/06/2022   ID:  Brandi [name], DOB Jul 20, 1955, MRN [id number]  PCP:  [name], FNP  Cardiologist:  Sinclair Grooms, MD   Referring MD: [name], FNP   Chief Complaint  Patient presents with   Hypertension   Congestive Heart Failure   Follow-up    Morbid obesity Obstructive sleep apnea Diabetes mellitus Diastolic heart failure    History of Present Illness:    Brandi [name] is a 66 y.o. female with a hx of severe hypertension, hypertensive heart disease, diastolic heart failure (EF 55-60% in 2020), multiple medication intolerances, significant bilateral lower extremity edema, and morbid obesity, diabetes with A1c up to 6.6 back in 2020, now down to 5.8 with Ozempic, and obstructive sleep apnea but not using CPAP.  On semaglutide.   She has lost 32 pounds since last office visit on semaglutide and with accompanying exercise.  She denies shortness of breath, lower extremity swelling, and chest pain.  States she is compliant with CPAP.  She wants to know if she can cut back on some of her medications now that she has lost weight.  The only possibility is decreasing the intensity of statin therapy since her LDL is 45.  Past Medical History:  Diagnosis Date   Anemia    Arthritis    Back pain    Edema, lower extremity    Food allergy    GERD (gastroesophageal reflux disease)    Hypertension    Hypertensive crisis    Joint pain    Morbid obesity ([place])    Osteoporosis    Pre-diabetes    Rheumatoid arthritis ([place])    Sciatica neuralgia    Sleep apnea     Past Surgical History:  Procedure Laterality Date   ABDOMINAL HYSTERECTOMY     PARTIAL   CHOLECYSTECTOMY     PROTESTIC HIPS     PROTESTIC KNEE      Current Medications: Current Meds  Medication Sig   amLODipine (NORVASC) 5 MG tablet Take 1.5 tablets (7.5 mg total) by mouth daily.   atorvastatin (LIPITOR) 20 MG tablet Take 1 tablet (20 mg total) by mouth daily.    Biotin 1000 MCG tablet Take 1,000 mcg by mouth every morning.   cetirizine (ZYRTEC) 10 MG tablet Take 10 mg by mouth every morning.   Cyanocobalamin (VITAMIN B-12 PO) Take 1 drop by mouth daily.   doxycycline (MONODOX) 100 MG capsule Take 1 capsule (100 mg total) by mouth 2 (two) times daily.   fluticasone (FLONASE) 50 MCG/ACT nasal spray 1 spray daily as needed for allergies or rhinitis.   Magnesium 500 MG TABS Take 500 mg by mouth at bedtime.   mupirocin ointment (BACTROBAN) 2 % Apply 1 Application topically daily.   Nebivolol HCl (BYSTOLIC) 20 MG TABS Take 1 tablet by mouth once daily   omeprazole (PRILOSEC OTC) 20 MG tablet Take 20 mg by mouth every morning.   OVER THE COUNTER MEDICATION Take 30 mg by mouth 2 (two) times daily. Strengtia 30 mg   OVER THE COUNTER MEDICATION Take 1 tablet by mouth 2 (two) times daily. Hepato - C1   OVER THE COUNTER MEDICATION Take 1 capsule by mouth 2 (two) times daily. Omega CO3-SE   Semaglutide, 2 MG/DOSE, (OZEMPIC, 2 MG/DOSE,) 8 MG/3ML SOPN Inject 2 mg into the skin once a week.   tizanidine (ZANAFLEX) 2 MG capsule Take 1 capsule (2 mg total) by mouth 3 (three) times daily  as needed for muscle spasms.   topiramate (TOPAMAX) 50 MG tablet Take 1 tablet (50 mg total) by mouth 2 (two) times daily.   traMADol (ULTRAM) 50 MG tablet Take 1 tablet (50 mg total) by mouth every 6 (six) hours as needed (pain).   triamcinolone ointment (KENALOG) 0.1 % Apply 1 Application topically 2 (two) times daily.   valsartan (DIOVAN) 160 MG tablet Take 1 tablet (160 mg total) by mouth daily.   Vitamin E 180 MG CAPS Take 180 mg by mouth every morning.   [DISCONTINUED] atorvastatin (LIPITOR) 40 MG tablet Take 1 tablet (40 mg total) by mouth every morning.     Allergies:   Hydralazine, Meperidine hcl, Aliskiren, Aspirin, Juniper berries, Latex, Rofecoxib, Spironolactone, Chocolate, Furosemide, Oxycodone-acetaminophen, and Sulfa antibiotics   Social History   Socioeconomic  History   Marital status: Single    Spouse name: Not on file   Number of children: Not on file   Years of education: Not on file   Highest education level: Not on file  Occupational History   Occupation: Retired Medical illustrator Nurseb  Tobacco Use   Smoking status: Never   Smokeless tobacco: Never  Vaping Use   Vaping Use: Never used  Substance and Sexual Activity   Alcohol use: Not Currently   Drug use: Not Currently   Sexual activity: Not Currently  Other Topics Concern   Not on file  Social History Narrative   Lives alone   Right handed   Drinks no caffeine   Social Determinants of Health   Financial Resource Strain: Low Risk  (03/16/2021)   Overall Financial Resource Strain (CARDIA)    Difficulty of Paying Living Expenses: Not hard at all  Food Insecurity: No Food Insecurity (03/16/2021)   Hunger Vital Sign    Worried About Running Out of Food in the Last Year: Never true    [place] in the Last Year: Never true  Transportation Needs: No Transportation Needs (03/16/2021)   PRAPARE - Hydrologist (Medical): No    Lack of Transportation (Non-Medical): No  Physical Activity: Insufficiently Active (06/14/2021)   Exercise Vital Sign    Days of Exercise per Week: 2 days    Minutes of Exercise per Session: 40 min  Stress: No Stress Concern Present (03/16/2021)   [place]    Feeling of Stress : Not at all  Social Connections: Unknown (11/04/2018)   Social Connection and Isolation Panel [NHANES]    Frequency of Communication with Friends and Family: More than three times a week    Frequency of Social Gatherings with Friends and Family: More than three times a week    Attends Religious Services: 1 to 4 times per year    Active Member of [employer] or Organizations: No    Attends Music therapist: Never    Marital Status: Not on file     Family History: The  patient's family history includes Anxiety disorder in her father; Bipolar disorder in her father; Cancer in her mother; Diabetes in her father; Eating disorder in her mother; Heart disease in her father and mother; High Cholesterol in her father; High blood pressure in her father; Hypertension in her mother and sister; Kidney disease in her father; Obesity in her mother; Schizophrenia in her father.  ROS:   Please see the history of present illness.    She feels great.  No limitations.  She did dislocate  her hip.  All other systems reviewed and are negative.  EKGs/Labs/Other Studies Reviewed:    The following studies were reviewed today:  Echocardiogram 2020: IMPRESSIONS   1. Left ventricular ejection fraction, by visual estimation, is 55 to  60%. The left ventricle has normal function. Left ventricular septal wall  thickness was mildly increased. Mildly increased left ventricular  posterior wall thickness.   2. Left ventricular diastolic parameters are consistent with Grade I  diastolic dysfunction (impaired relaxation).   3. Definity contrast agent was given IV to delineate the left ventricular  endocardial borders.   4. Global right ventricle was not well visualized.The right ventricular  size is not well visualized. Right vetricular wall thickness was not  assessed.   5. Left atrial size was not well visualized.   6. Right atrial size was not well visualized.   7. The mitral valve was not well visualized. No evidence of mitral valve  regurgitation. No evidence of mitral stenosis.   8. The tricuspid valve is not well visualized. Tricuspid valve  regurgitation is not demonstrated.   9. The aortic valve was not well visualized. Aortic valve regurgitation  is not visualized.  10. The pulmonic valve was not well visualized. Pulmonic valve  regurgitation is not visualized.  11. The inferior vena cava is normal in size with greater than 50%  respiratory variability, suggesting right  atrial pressure of 3 mmHg.   EKG:  EKG normal sinus rhythm with normal EKG appearance.  Compared to 08/25/20, no significant changes noted.  Recent Labs: 06/14/2021: ALT 14; Hemoglobin 12.6; Platelets 269 09/28/2021: BUN 20; Creatinine, Ser 0.93; Potassium 4.2; Sodium 139  Recent Lipid Panel    Component Value Date/Time   CHOL 111 09/28/2021 1644   TRIG 79 09/28/2021 1644   HDL 46 09/28/2021 1644   CHOLHDL 2.4 09/28/2021 1644   LDLCALC 49 09/28/2021 1644    Physical Exam:    VS:  BP (!) 140/80   Pulse 78   Ht '5\' 4"'$  (1.626 m)   Wt 205 lb (93 kg)   SpO2 99%   BMI 35.19 kg/m    At last office visit October 2022 weight was 237 pounds.  Wt Readings from Last 3 Encounters:  01/06/22 205 lb (93 kg)  12/29/21 207 lb (93.9 kg)  12/27/21 206 lb 3.2 oz (93.5 kg)     GEN: BMI 35 down from 40.Marland Kitchen No acute distress HEENT: Normal NECK: No JVD. LYMPHATICS: No lymphadenopathy CARDIAC: No murmur. RRR no gallop, or edema. VASCULAR:  Normal Pulses. No bruits. RESPIRATORY:  Clear to auscultation without rales, wheezing or rhonchi  ABDOMEN: Soft, non-tender, non-distended, No pulsatile mass, MUSCULOSKELETAL: No deformity  SKIN: Warm and dry NEUROLOGIC:  Alert and oriented x 3 PSYCHIATRIC:  Normal affect   ASSESSMENT:    1. Hypertensive heart disease with heart failure ([place])   2. Chronic diastolic HF (heart failure) ([place])   3. Essential hypertension   4. Morbid obesity ([place])   5. Prediabetes   6. Obstructive sleep apnea    PLAN:    In order of problems listed above:  There is no room to further reduce her blood pressure therapy. Consider adding SGLT2, especially if the blood pressure continues to range in the upper normal as it is today.  Her range is 128 to 324 mmHg systolic and 70 to 90 mmHg diastolic. See #2 above Reduction in body mass index from 35-40 over the past 12 months. Hemoglobin A1c is now not in the prediabetic  range. Compliant with CPAP.  Needs longitudinal  cardiology follow-up for diastolic heart failure, hypertensive heart disease, and risk factor modification.    Medication Adjustments/Labs and Tests Ordered: Current medicines are reviewed at length with the patient today.  Concerns regarding medicines are outlined above.  Orders Placed This Encounter  Procedures   EKG 12-Lead   Meds ordered this encounter  Medications   atorvastatin (LIPITOR) 20 MG tablet    Sig: Take 1 tablet (20 mg total) by mouth daily.    Dispense:  90 tablet    Refill:  3    Dose change.    Patient Instructions  Medication Instructions:  Your physician has recommended you make the following change in your medication:   1) DECREASE Atorvastatin to '20mg'$  daily  *If you need a refill on your cardiac medications before your next appointment, please call your pharmacy*  Lab Work: NONE  Testing/Procedures: NONE  Follow-Up: At [facility], you and your health needs are our priority.  As part of our continuing mission to provide you with exceptional heart care, we have created designated Provider Care Teams.  These Care Teams include your primary Cardiologist (physician) and Advanced Practice Providers (APPs -  Physician Assistants and Nurse Practitioners) who all work together to provide you with the care you need, when you need it.  Your next appointment:   1 year(s)  The format for your next appointment:   In Person  Provider:   Sinclair Grooms, MD   Important Information About Sugar         Signed, Sinclair Grooms, MD  01/06/2022 9:08 AM    [place]

## 2022-01-05 DIAGNOSIS — G5601 Carpal tunnel syndrome, right upper limb: Secondary | ICD-10-CM | POA: Diagnosis not present

## 2022-01-06 ENCOUNTER — Encounter: Payer: Self-pay | Admitting: Interventional Cardiology

## 2022-01-06 ENCOUNTER — Encounter (HOSPITAL_BASED_OUTPATIENT_CLINIC_OR_DEPARTMENT_OTHER): Payer: Medicare Other | Attending: Internal Medicine | Admitting: Internal Medicine

## 2022-01-06 ENCOUNTER — Ambulatory Visit: Payer: Medicare Other | Attending: Interventional Cardiology | Admitting: Interventional Cardiology

## 2022-01-06 VITALS — BP 140/80 | HR 78 | Ht 64.0 in | Wt 205.0 lb

## 2022-01-06 DIAGNOSIS — I10 Essential (primary) hypertension: Secondary | ICD-10-CM | POA: Insufficient documentation

## 2022-01-06 DIAGNOSIS — N764 Abscess of vulva: Secondary | ICD-10-CM | POA: Insufficient documentation

## 2022-01-06 DIAGNOSIS — L02215 Cutaneous abscess of perineum: Secondary | ICD-10-CM | POA: Diagnosis not present

## 2022-01-06 DIAGNOSIS — R7303 Prediabetes: Secondary | ICD-10-CM | POA: Diagnosis not present

## 2022-01-06 DIAGNOSIS — I5032 Chronic diastolic (congestive) heart failure: Secondary | ICD-10-CM

## 2022-01-06 DIAGNOSIS — I110 Hypertensive heart disease with heart failure: Secondary | ICD-10-CM | POA: Diagnosis not present

## 2022-01-06 DIAGNOSIS — M069 Rheumatoid arthritis, unspecified: Secondary | ICD-10-CM | POA: Insufficient documentation

## 2022-01-06 DIAGNOSIS — G4733 Obstructive sleep apnea (adult) (pediatric): Secondary | ICD-10-CM

## 2022-01-06 DIAGNOSIS — S31819A Unspecified open wound of right buttock, initial encounter: Secondary | ICD-10-CM | POA: Insufficient documentation

## 2022-01-06 DIAGNOSIS — L0231 Cutaneous abscess of buttock: Secondary | ICD-10-CM | POA: Diagnosis not present

## 2022-01-06 MED ORDER — ATORVASTATIN CALCIUM 20 MG PO TABS
20.0000 mg | ORAL_TABLET | Freq: Every day | ORAL | 3 refills | Status: DC
Start: 2022-01-06 — End: 2023-03-06

## 2022-01-06 NOTE — Progress Notes (Signed)
Brandi [name], Brandi [name] ([id number]) [phone] [id number] Page 1 of 4 Visit Report for 01/06/2022 Abuse Risk Screen Details Patient Name: Date of Service: Brandi [name], Brandi [name]. 01/06/2022 9:45 A [name] Medical Record Number: [id number] Patient Account Number: [id number] Date of Birth/Sex: Treating RN: Jan 18, 1956 (66 y.o. Brandi [name] Primary Care Brandi [name]: Brandi [name] Other Clinician: Referring Brandi [name]: Treating Brandi [name]/Extender: Brandi [name] in [name]: 0 Abuse Risk Screen Items Answer ABUSE RISK SCREEN: Has anyone close to you tried to hurt or harm you recentlyo No Do you feel uncomfortable with anyone in your familyo No Has anyone forced you do things that you didnt want to doo No Electronic Signature(s) Signed: 01/06/2022 3:23:10 PM By: Brandi Pilling RN, BSN Entered By: Brandi [name] on 01/06/2022 10:14:35 -------------------------------------------------------------------------------- Activities of Daily Living Details Patient Name: Date of Service: Brandi [name], Brandi [name] 01/06/2022 9:45 A [name] Medical Record Number: [id number] Patient Account Number: [id number] Date of Birth/Sex: Treating RN: 05-06-1955 (66 y.o. Brandi [name] Primary Care Brandi [name]: Brandi [name] Other Clinician: Referring Brandi [name]: Treating Brandi [name]/Extender: Brandi [name] in [name]: 0 Activities of Daily Living Items Answer Activities of Daily Living (Please select one for each item) Drive Automobile Completely Able T Medications ake Completely Able Use T elephone Completely Able Care for Appearance Completely Able Use T oilet Completely Able Bath / Shower Completely Able Dress Self Completely Able Feed Self Completely Able Walk Completely Able Get In / Out Bed Completely Able Housework Completely Able Prepare Meals Completely Able Handle Money Completely Able Shop for Self Completely Able Electronic Signature(s) Signed:  01/06/2022 3:23:10 PM By: Brandi Pilling RN, BSN Entered By: Brandi [name] on 01/06/2022 10:14:50 [name] ([id number]) [id number] Page 2 of 4 -------------------------------------------------------------------------------- Education Screening Details Patient Name: Date of Service: Brandi [name], Brandi [name] 01/06/2022 9:45 A [name] Medical Record Number: [id number] Patient Account Number: [id number] Date of Birth/Sex: Treating RN: 07/24/1955 (66 y.o. Brandi [name], Brandi [name] Primary Care Kamaryn Grimley: Brandi [name] Other Clinician: Referring Brandi [name]: Treating Brandi [name]/Extender: Brandi [name] in [name]: 0 Primary Learner Assessed: Patient Learning Preferences/Education Level/Primary Language Learning Preference: Explanation, Demonstration, Printed Material Highest Education Level: College or Above Preferred Language: English Cognitive Barrier Language Barrier: No Translator Needed: No Memory Deficit: No Emotional Barrier: No Cultural/Religious Beliefs Affecting Medical Care: No Physical Barrier Impaired Vision: Yes Glasses Impaired Hearing: No Decreased Hand dexterity: No Knowledge/Comprehension Knowledge Level: High Comprehension Level: High Ability to understand written instructions: High Ability to understand verbal instructions: High Motivation Anxiety Level: Calm Cooperation: Cooperative Education Importance: Acknowledges Need Interest in Health Problems: Asks Questions Perception: Coherent Willingness to Engage in Self-Management High Activities: Readiness to Engage in Self-Management High Activities: Electronic Signature(s) Signed: 01/06/2022 3:23:10 PM By: Brandi Pilling RN, BSN Entered By: Brandi [name] on 01/06/2022 10:15:11 -------------------------------------------------------------------------------- Fall Risk Assessment Details Patient Name: Date of Service: Brandi Bucco. 01/06/2022 9:45 A [name] Medical Record  Number: [id number] Patient Account Number: [id number] Date of Birth/Sex: Treating RN: 04-18-1955 (66 y.o. Brandi [name] Primary Care Vann Okerlund: Brandi [name] Other Clinician: Referring Brandi [name]: Treating Brandi [name]/Extender: Brandi [name] in [name]: 0 Fall Risk Assessment Items Have you had 2 or more falls in the last 12 monthso 0 No Brandi [name], Brandi [name] ([id number]) [phone] [id number] Page 3 of 4 Have you had any fall that resulted in injury in the last 12 monthso 0 No FALLS RISK SCREEN History of falling - immediate or within 3 months 0 No Secondary diagnosis (Do you have 2 or more medical  diagnoseso) 0 No Ambulatory aid None/bed rest/wheelchair/nurse 0 No Crutches/cane/walker 15 Yes Furniture 0 No Intravenous therapy Access/Saline/Heparin Lock 0 No Gait/Transferring Normal/ bed rest/ wheelchair 0 Yes Weak (short steps with or without shuffle, stooped but able to lift head while walking, may seek 0 No support from furniture) Impaired (short steps with shuffle, may have difficulty arising from chair, head down, impaired 0 No balance) Mental Status Oriented to own ability 0 Yes Electronic Signature(s) Signed: 01/06/2022 3:23:10 PM By: Brandi Pilling RN, BSN Entered By: Brandi [name] on 01/06/2022 10:15:26 -------------------------------------------------------------------------------- Foot Assessment Details Patient Name: Date of Service: Brandi Bucco. 01/06/2022 9:45 A [name] Medical Record Number: [id number] Patient Account Number: [id number] Date of Birth/Sex: Treating RN: 04-22-1955 (66 y.o. Brandi [name] Primary Care Brandi [name]: Brandi [name] Other Clinician: Referring Brandi [name]: Treating Brandi [name]: 0 Foot Assessment Items Site Locations + = Sensation present, - = Sensation absent, Brandi = Callus, U = Ulcer R = Redness, W = Warmth, [name] = Maceration, PU = Pre-ulcerative  lesion F = Fissure, S = Swelling, D = Dryness Assessment Right: Left: Other Deformity: No No Prior Foot Ulcer: No No Prior Amputation: No No Charcot Joint: No No Ambulatory Status: Ambulatory With Help Assistance Device: Brandi [name], Brandi [name] ([id number]) [phone] [id number] Page 4 of 4 Gait: Steady Notes No BLE wounds. Electronic Signature(s) Signed: 01/06/2022 3:23:10 PM By: Brandi Pilling RN, BSN Entered By: Brandi [name] on 01/06/2022 10:16:01 -------------------------------------------------------------------------------- Nutrition Risk Screening Details Patient Name: Date of Service: Brandi [name], Brandi [name]. 01/06/2022 9:45 A [name] Medical Record Number: [id number] Patient Account Number: [id number] Date of Birth/Sex: Treating RN: 16-Jan-1956 (66 y.o. Brandi [name], Brandi [name] Primary Care Sopheap Boehle: Brandi [name] Other Clinician: Referring Theodora Lalanne: Treating Daneen Volcy/Extender: Brandi [name] in [name]: 0 Height (in): 64 Weight (lbs): 205 Body Mass Index (BMI): 35.2 Nutrition Risk Screening Items Score Screening NUTRITION RISK SCREEN: I have an illness or condition that made me change the kind and/or amount of food I eat 2 Yes I eat fewer than two meals per day 0 No I eat few fruits and vegetables, or milk products 0 No I have three or more drinks of beer, liquor or wine almost every day 0 No I have tooth or mouth problems that make it hard for me to eat 0 No I don't always have enough money to buy the food I need 0 No I eat alone most of the time 0 No I take three or more different prescribed or over-the-counter drugs a day 1 Yes Without wanting to, I have lost or gained 10 pounds in the last six months 0 No I am not always physically able to shop, cook and/or feed myself 0 No Nutrition Protocols Good Risk Protocol Moderate Risk Protocol 0 Provide education on nutrition High Risk Proctocol Risk Level: Moderate Risk Score:  3 Electronic Signature(s) Signed: 01/06/2022 3:23:10 PM By: Brandi Pilling RN, BSN Entered By: Brandi [name] on 01/06/2022 10:15:41

## 2022-01-06 NOTE — Patient Instructions (Signed)
Medication Instructions:  Your physician has recommended you make the following change in your medication:   1) DECREASE Atorvastatin to '20mg'$  daily  *If you need a refill on your cardiac medications before your next appointment, please call your pharmacy*  Lab Work: NONE  Testing/Procedures: NONE  Follow-Up: At [facility], you and your health needs are our priority.  As part of our continuing mission to provide you with exceptional heart care, we have created designated Provider Care Teams.  These Care Teams include your primary Cardiologist (physician) and Advanced Practice Providers (APPs -  Physician Assistants and Nurse Practitioners) who all work together to provide you with the care you need, when you need it.  Your next appointment:   1 year(s)  The format for your next appointment:   In Person  Provider:   Sinclair Grooms, MD   Important Information About Sugar

## 2022-01-06 NOTE — Progress Notes (Signed)
Brandi, [name] ([id number]) [id number] Page 1 of 6 Visit Report for 01/06/2022 Chief Complaint Document Details Patient Name: Date of Service: Brandi [name], Brandi [name] 01/06/2022 9:45 A M Medical Record Number: [id number] Patient Account Number: [id number] Date of Birth/Sex: Treating RN: Apr 27, 1955 (66 y.o. F) Primary Care Provider: [name] Other Clinician: Referring Provider: Treating Provider/Extender: Angie Fava Weeks in Treatment: 0 Information Obtained from: Patient Chief Complaint 01/06/2022; previous open wound to the right buttocks secondary to abscess Electronic Signature(s) Signed: 01/06/2022 11:07:44 AM By: Kalman Shan DO Entered By: Kalman Shan on 01/06/2022 10:52:02 -------------------------------------------------------------------------------- HPI Details Patient Name: Date of Service: Brandi [name]. 01/06/2022 9:45 A M Medical Record Number: [id number] Patient Account Number: [id number] Date of Birth/Sex: Treating RN: Dec 29, 1955 (66 y.o. F) Primary Care Provider: [name] Other Clinician: Referring Provider: Treating Provider/Extender: Angie Fava Weeks in Treatment: 0 History of Present Illness HPI Description: 01/06/2022 Brandi [name] is a 66 year old female with a past medical history of morbid obesity that presents to the clinic for a previous open wound to her right buttocks near the perineum secondary to abscess. She visited her primary office for this issue On 10/3 and was treated with mupirocin ointment, doxycycline and Hibiclens. She had a culture of this area that grew Morganella Lilia Pro I susceptible to doxycycline. She states that today Is her last day of doxycycline. She currently reports no drainage. She has no open wounds today. Electronic Signature(s) Signed: 01/06/2022 11:07:44 AM By: Kalman Shan DO Entered By: Kalman Shan on 01/06/2022  10:54:33 -------------------------------------------------------------------------------- Physical Exam Details Patient Name: Date of Service: Brandi [name]. 01/06/2022 9:45 A M Medical Record Number: [id number] Patient Account Number: [id number] Date of Birth/Sex: Treating RN: Aug 02, 1955 (66 y.o. F) Primary Care Provider: [name] Other Clinician: Referring Provider: Treating Provider/Extender: Taeja, Debellis Dahlgren ([id number]) [phone].pdf Page 2 of 6 Weeks in Treatment: 0 Constitutional respirations regular, non-labored and within target range for patient.Marland Kitchen Psychiatric pleasant and cooperative. Notes Right buttocks close to the perineum there is epithelization to the previous wound site. On Palpation there is no drainage noted. No open wounds to the surrounding area. Electronic Signature(s) Signed: 01/06/2022 11:07:44 AM By: Kalman Shan DO Entered By: Kalman Shan on 01/06/2022 10:55:42 -------------------------------------------------------------------------------- Physician Orders Details Patient Name: Date of Service: Brandi [name]. 01/06/2022 9:45 A M Medical Record Number: [id number] Patient Account Number: [id number] Date of Birth/Sex: Treating RN: May 20, 1955 (66 y.o. Brandi [name] Primary Care Provider: [name] Other Clinician: Referring Provider: Treating Provider/Extender: Angie Fava Weeks in Treatment: 0 Verbal / Phone Orders: No Diagnosis Coding ICD-10 Coding Code Description S31.40XA Unspecified open wound of vagina and vulva, initial encounter N76.4 Abscess of vulva Discharge From [facility] Services Discharge from [place] - continue to wash with Hibiclens weekly. Electronic Signature(s) Signed: 01/06/2022 11:07:44 AM By: Kalman Shan DO Entered By: Kalman Shan on 01/06/2022  10:55:46 -------------------------------------------------------------------------------- Problem List Details Patient Name: Date of Service: Brandi [name]. 01/06/2022 9:45 A M Medical Record Number: [id number] Patient Account Number: [id number] Date of Birth/Sex: Treating RN: 29-Jan-1956 (66 y.o. F) Primary Care Provider: [name] Other Clinician: Referring Provider: Treating Provider/Extender: Angie Fava Weeks in Treatment: 0 Active Problems ICD-10 Encounter Code Description Active Date MDM Diagnosis S31.819A Unspecified open wound of right buttock, initial encounter 01/06/2022 No Yes Brandi, [name] ([id number]) [id number] Page 3 of 6 L02.31 Cutaneous abscess of buttock 01/06/2022 No Yes Inactive Problems Resolved Problems Electronic  Signature(s) Signed: 01/06/2022 11:07:44 AM By: Kalman Shan DO Entered By: Kalman Shan on 01/06/2022 10:51:46 -------------------------------------------------------------------------------- Progress Note Details Patient Name: Date of Service: Brandi [name]. 01/06/2022 9:45 A M Medical Record Number: [id number] Patient Account Number: [id number] Date of Birth/Sex: Treating RN: 07/30/1955 (66 y.o. F) Primary Care Provider: [name] Other Clinician: Referring Provider: Treating Provider/Extender: Angie Fava Weeks in Treatment: 0 Subjective Chief Complaint Information obtained from Patient 01/06/2022; previous open wound to the right buttocks secondary to abscess History of Present Illness (HPI) 01/06/2022 [name] is a 66 year old female with a past medical history of morbid obesity that presents to the clinic for a previous open wound to her right buttocks near the perineum secondary to abscess. She visited her primary office for this issue On 10/3 and was treated with mupirocin ointment, doxycycline and Hibiclens. She had a culture of this  area that grew Morganella Lilia Pro I susceptible to doxycycline. She states that today Is her last day of doxycycline. She currently reports no drainage. She has no open wounds today. Patient History Allergies Sulfa (Sulfonamide Antibiotics) (Reaction: rash), oxycodone (Reaction: itching, rash), Lasix (Reaction: rash with extreme dehydration), chocolate flavor (Reaction: N/V, rash), spironolactone (Reaction: high doses cause nausea and rash), rofecoxib (Reaction: diarrhea), juniper fruit (Reaction: hives), latex (Reaction: hives), aspirin (Reaction: nausea), aliskiren (Reaction: hives), meperidine (Reaction: hallucinations, rash), hydralazine (Reaction: itching) Family History Cancer - Mother, Heart Disease - Mother,Father, Hypertension - Mother,Father, Kidney Disease - Father. Social History Never smoker, Marital Status - Divorced, Alcohol Use - Moderate - wine, Drug Use - No History, Caffeine Use - Never. Medical History Hematologic/Lymphatic Patient has history of Anemia Respiratory Patient has history of Sleep Apnea - CPAP Cardiovascular Patient has history of Hypertension Musculoskeletal Patient has history of Rheumatoid Arthritis, Osteoarthritis Hospitalization/Surgery History - hysterectomy partial. - hip replacements. - knee replacements. Medical A Surgical History Notes nd Constitutional Symptoms (General Health) back pain morbid obesity sciatica neuralgia Cardiovascular edema to BLE Gastrointestinal GERD Endocrine Pre-diabetes Musculoskeletal osteoporosis CLYDINE, PARKISON ([id number]) [id number] Page 4 of 6 Objective Constitutional respirations regular, non-labored and within target range for patient.. Vitals Time Taken: 10:11 AM, Height: 64 in, Source: Stated, Weight: 205 lbs, Source: Stated, BMI: 35.2, Temperature: 98.6 F, Pulse: 84 bpm, Respiratory Rate: 20 breaths/min, Blood Pressure: 159/103 mmHg. Psychiatric pleasant and  cooperative. General Notes: Right buttocks close to the perineum there is epithelization to the previous wound site. On Palpation there is no drainage noted. No open wounds to the surrounding area. Assessment Active Problems ICD-10 Unspecified open wound of right buttock, initial encounter Cutaneous abscess of buttock 2 weeks ago patient developed an abscess to the right buttocks near the perineum that opened and was treated with doxycycline, mupirocin and Hibiclens. She has done well with this treatment. Her wound has closed. She may follow-up as needed. Plan Discharge From [facility] Services: Discharge from [place] - continue to wash with Hibiclens weekly. 1. Follow-up as needed 2. She may do Hibiclens weekly for the next 2 weeks Electronic Signature(s) Signed: 01/06/2022 11:07:44 AM By: Kalman Shan DO Entered By: Kalman Shan on 01/06/2022 10:56:43 -------------------------------------------------------------------------------- HxROS Details Patient Name: Date of Service: Brandi [name]. 01/06/2022 9:45 A M Medical Record Number: [id number] Patient Account Number: [id number] Date of Birth/Sex: Treating RN: 02/14/1956 (66 y.o. Brandi [name] Primary Care Provider: [name] Other Clinician: Referring Provider: Treating Provider/Extender: Angie Fava Weeks in Treatment: 0 Constitutional Symptoms (General Health) Medical History: LUCELIA, Brandi [name] ([id number])  [id number] Page 5 of 6 Past Medical History Notes: back pain morbid obesity sciatica neuralgia Hematologic/Lymphatic Medical History: Positive for: Anemia Respiratory Medical History: Positive for: Sleep Apnea - CPAP Cardiovascular Medical History: Positive for: Hypertension Past Medical History Notes: edema to BLE Gastrointestinal Medical History: Past Medical History Notes: GERD Endocrine Medical History: Past Medical History  Notes: Pre-diabetes Musculoskeletal Medical History: Positive for: Rheumatoid Arthritis; Osteoarthritis Past Medical History Notes: osteoporosis Immunizations Pneumococcal Vaccine: Received Pneumococcal Vaccination: Yes Received Pneumococcal Vaccination On or After 60th Birthday: Yes Implantable Devices No devices added Hospitalization / Surgery History Type of Hospitalization/Surgery hysterectomy partial hip replacements knee replacements Family and Social History Cancer: Yes - Mother; Heart Disease: Yes - Mother,Father; Hypertension: Yes - Mother,Father; Kidney Disease: Yes - Father; Never smoker; Marital Status - Divorced; Alcohol Use: Moderate - wine; Drug Use: No History; Caffeine Use: Never; Financial Concerns: No; Food, Clothing or Shelter Needs: No; Support System Lacking: No; Transportation Concerns: No Electronic Signature(s) Signed: 01/06/2022 11:07:44 AM By: Kalman Shan DO Signed: 01/06/2022 3:23:10 PM By: Deon Pilling RN, BSN Entered By: Deon Pilling on 01/06/2022 10:22:04 -------------------------------------------------------------------------------- SuperBill Details Patient Name: Date of Service: MULDRO W, Kaycie M. 01/06/2022 [name] ([id number]) [id number] Page 6 of 6 Medical Record Number: [id number] Patient Account Number: [id number] Date of Birth/Sex: Treating RN: 12-26-1955 (66 y.o. Brandi [name] Primary Care Provider: [name] Other Clinician: Referring Provider: Treating Provider/Extender: Angie Fava Weeks in Treatment: 0 Diagnosis Coding ICD-10 Codes Code Description [phone] Unspecified open wound of right buttock, initial encounter L02.31 Cutaneous abscess of buttock Facility Procedures : CPT4 Code: [id number] Description: 99214 - WOUND CARE VISIT-LEV 4 EST PT Modifier: Quantity: 1 Physician Procedures : CPT4 Code Description Modifier [id number] [place] PHYS LEVEL 3 NEW PT ICD-10  Diagnosis Description P54.656C Unspecified open wound of right buttock, initial encounter L02.31 Cutaneous abscess of buttock Quantity: 1 Electronic Signature(s) Signed: 01/06/2022 11:07:44 AM By: Kalman Shan DO Entered By: Kalman Shan on 01/06/2022 10:56:56

## 2022-01-06 NOTE — Progress Notes (Signed)
Brandi [name], Brandi [name] ([id number]) [id number] Page 1 of 6 Visit Report for 01/06/2022 Allergy List Details Patient Name: Date of Service: Brandi [name] 01/06/2022 9:45 A M Medical Record Number: [id number] Patient Account Number: [id number] Date of Birth/Sex: Treating RN: [name] (66 y.o. Brandi [name], Brandi [name] Primary Care Brandi [name]: Brandi [name] Other Clinician: Referring Brandi [name]: Treating Brandi [name]/Extender: Brandi [name] Weeks in Treatment: 0 Allergies Active Allergies Sulfa (Sulfonamide Antibiotics) Reaction: rash oxycodone Reaction: itching, rash Lasix Reaction: rash with extreme dehydration chocolate flavor Reaction: N/V, rash spironolactone Reaction: high doses cause nausea and rash rofecoxib Reaction: diarrhea juniper fruit Reaction: hives latex Reaction: hives aspirin Reaction: nausea aliskiren Reaction: hives meperidine Reaction: hallucinations, rash hydralazine Reaction: itching Allergy Notes Electronic Signature(s) Signed: 01/06/2022 3:23:10 PM By: Deon Pilling RN, BSN Entered By: Deon Pilling on 01/06/2022 07:52:57 -------------------------------------------------------------------------------- Arrival Information Details Patient Name: Date of Service: Brandi [name]. 01/06/2022 9:45 A Brandi [name] ([id number]) [id number] Page 2 of 6 Medical Record Number: [id number] Patient Account Number: [id number] Date of Birth/Sex: Treating RN: Brandi [name] (66 y.o. Brandi [name] Primary Care Brandi [name]: Brandi [name] Other Clinician: Referring Latera Mclin: Treating Brandi [name]/Extender: Brandi [name] in Treatment: 0 Visit Information Patient Arrived: Brandi [name] Time: 10:11 Accompanied By: self Transfer Assistance: None Patient Identification Verified: Yes Secondary Verification Process Completed: Yes Patient Requires Transmission-Based Precautions: No Patient  Has Alerts: No Electronic Signature(s) Signed: 01/06/2022 3:23:10 PM By: Deon Pilling RN, BSN Entered By: Deon Pilling on 01/06/2022 10:14:06 -------------------------------------------------------------------------------- Clinic Level of Care Assessment Details Patient Name: Date of Service: Brandi [name], Brandi [name]. 01/06/2022 9:45 A M Medical Record Number: [id number] Patient Account Number: [id number] Date of Birth/Sex: Treating RN: [name] (67 y.o. Brandi [name] Primary Care Brandi [name]: Brandi [name] Other Clinician: Referring Brandi [name]: Treating Brandi [name]/Extender: Brandi [name] Weeks in Treatment: 0 Clinic Level of Care Assessment Items TOOL 2 Quantity Score X- 1 0 Use when only an EandM is performed on the INITIAL visit ASSESSMENTS - Nursing Assessment / Reassessment X- 1 20 General Physical Exam (combine w/ comprehensive assessment (listed just below) when performed on new pt. evals) X- 1 25 Comprehensive Assessment (HX, ROS, Risk Assessments, Wounds Hx, etc.) ASSESSMENTS - Wound and Skin A ssessment / Reassessment '[]'$  - 0 Simple Wound Assessment / Reassessment - one wound '[]'$  - 0 Complex Wound Assessment / Reassessment - multiple wounds X- 1 10 Dermatologic / Skin Assessment (not related to wound area) ASSESSMENTS - Ostomy and/or Continence Assessment and Care '[]'$  - 0 Incontinence Assessment and Management '[]'$  - 0 Ostomy Care Assessment and Management (repouching, etc.) PROCESS - Coordination of Care X - Simple Patient / Family Education for ongoing care 1 15 '[]'$  - 0 Complex (extensive) Patient / Family Education for ongoing care X- 1 10 Staff obtains Programmer, systems, Records, T Results / Process Orders est '[]'$  - 0 Staff telephones HHA, Nursing Homes / Clarify orders / etc '[]'$  - 0 Routine Transfer to another Facility (non-emergent condition) '[]'$  - 0 Routine Hospital Admission (non-emergent condition) '[]'$  - 0 New Admissions / Insurance Authorizations / Ordering  NPWT Apligraf, etc. , '[]'$  - 0 Emergency Hospital Admission (emergent condition) X- 1 10 Simple Discharge Coordination Brandi [name], Brandi [name] ([id number]) [id number] Page 3 of 6 '[]'$  - 0 Complex (extensive) Discharge Coordination PROCESS - Special Needs '[]'$  - 0 Pediatric / Minor Patient Management '[]'$  - 0 Isolation Patient Management '[]'$  - 0 Hearing / Language / Visual special needs '[]'$  - 0 Assessment of Community assistance (transportation, D/C planning, etc.) '[]'$  -  0 Additional assistance / Altered mentation '[]'$  - 0 Support Surface(s) Assessment (bed, cushion, seat, etc.) INTERVENTIONS - Wound Cleansing / Measurement X- 1 5 Wound Imaging (photographs - any number of wounds) '[]'$  - 0 Wound Tracing (instead of photographs) X- 1 5 Simple Wound Measurement - one wound '[]'$  - 0 Complex Wound Measurement - multiple wounds X- 1 5 Simple Wound Cleansing - one wound '[]'$  - 0 Complex Wound Cleansing - multiple wounds INTERVENTIONS - Wound Dressings '[]'$  - 0 Small Wound Dressing one or multiple wounds '[]'$  - 0 Medium Wound Dressing one or multiple wounds '[]'$  - 0 Large Wound Dressing one or multiple wounds '[]'$  - 0 Application of Medications - injection INTERVENTIONS - Miscellaneous '[]'$  - 0 External ear exam '[]'$  - 0 Specimen Collection (cultures, biopsies, blood, body fluids, etc.) '[]'$  - 0 Specimen(s) / Culture(s) sent or taken to Lab for analysis '[]'$  - 0 Patient Transfer (multiple staff / Civil Service fast streamer / Similar devices) '[]'$  - 0 Simple Staple / Suture removal (25 or less) '[]'$  - 0 Complex Staple / Suture removal (26 or more) '[]'$  - 0 Hypo / Hyperglycemic Management (close monitor of Blood Glucose) X- 1 15 Ankle / Brachial Index (ABI) - do not check if billed separately Has the patient been seen at the hospital within the last three years: Yes Total Score: 120 Level Of Care: New/Established - Level 4 Electronic Signature(s) Signed: 01/06/2022 3:23:10 PM By: Deon Pilling RN,  BSN Entered By: Deon Pilling on 01/06/2022 10:37:12 -------------------------------------------------------------------------------- Encounter Discharge Information Details Patient Name: Date of Service: Brandi [name]. 01/06/2022 9:45 A M Medical Record Number: [id number] Patient Account Number: [id number] Date of Birth/Sex: Treating RN: [name] (66 y.o. Brandi [name] Primary Care Cintya Daughety: Brandi [name] Other Clinician: Referring Derreck Wiltsey: Treating Koralee Wedeking/Extender: Brandi [name] Weeks in Treatment: 0 Encounter Discharge Information Items Discharge Condition: Stable BETSI, CRESPI ([id number]) [id number] Page 4 of 6 Ambulatory Status: Cane Discharge Destination: Home Transportation: Private Auto Accompanied By: self Schedule Follow-up Appointment: No Clinical Summary of Care: Electronic Signature(s) Signed: 01/06/2022 3:23:10 PM By: Deon Pilling RN, BSN Entered By: Deon Pilling on 01/06/2022 10:48:57 -------------------------------------------------------------------------------- Lower Extremity Assessment Details Patient Name: Date of Service: Brandi [name]. 01/06/2022 9:45 A M Medical Record Number: [id number] Patient Account Number: [id number] Date of Birth/Sex: Treating RN: [name] (66 y.o. Brandi [name] Primary Care Dustyn Dansereau: Brandi [name] Other Clinician: Referring Dezarae Mcclaran: Treating Lauri Till/Extender: Brandi [name] Weeks in Treatment: 0 Electronic Signature(s) Signed: 01/06/2022 3:23:10 PM By: Deon Pilling RN, BSN Entered By: Deon Pilling on 01/06/2022 10:16:06 -------------------------------------------------------------------------------- Multi Wound Chart Details Patient Name: Date of Service: Brandi [name]. 01/06/2022 9:45 A M Medical Record Number: [id number] Patient Account Number: [id number] Date of Birth/Sex: Treating RN: Brandi [birth year] [name] (66 y.o. F) Primary Care Houa Nie:  Brandi [name] Other Clinician: Referring Nayeli Calvert: Treating Siearra Amberg/Extender: Brandi [name] Weeks in Treatment: 0 Vital Signs Height(in): 64 Pulse(bpm): 84 Weight(lbs): 205 Blood Pressure(mmHg): 159/103 Body Mass Index(BMI): 35.2 Temperature(F): 98.6 Respiratory Rate(breaths/min): 20 [Treatment Notes:Wound Assessments Treatment Notes] Electronic Signature(s) Signed: 01/06/2022 11:07:44 AM By: Kalman Shan DO Entered By: Kalman Shan on 01/06/2022 10:51:49 [name] ([id number]) [id number] Page 5 of 6 -------------------------------------------------------------------------------- Multi-Disciplinary Care Plan Details Patient Name: Date of Service: Brandi [name], Brandi [name] 01/06/2022 9:45 A M Medical Record Number: [id number] Patient Account Number: [id number] Date of Birth/Sex: Treating RN: [name] (66 y.o. Brandi [name] Primary Care Gable Odonohue: Brandi [name] Other Clinician: Referring Jacole Capley: Treating [name] Tolson/Extender: Brandi [name]  in Treatment: 0 Active Inactive Electronic Signature(s) Signed: 01/06/2022 3:23:10 PM By: Deon Pilling RN, BSN Entered By: Deon Pilling on 01/06/2022 10:49:13 -------------------------------------------------------------------------------- Pain Assessment Details Patient Name: Date of Service: Brandi [name]. 01/06/2022 9:45 A M Medical Record Number: [id number] Patient Account Number: [id number] Date of Birth/Sex: Treating RN: [name] (66 y.o. Brandi [name] Primary Care Marigene Erler: Brandi [name] Other Clinician: Referring Cecily Lawhorne: Treating Azani Brogdon/Extender: Brandi [name] Weeks in Treatment: 0 Active Problems Location of Pain Severity and Description of Pain Patient Has Paino No Site Locations Rate the pain. Current Pain Level: 0 Pain Management and Medication Current Pain Management: Medication: No Cold Application: No Rest:  No Massage: No Activity: No T.E.N.S.: No Heat Application: No Leg drop or elevation: No Is the Current Pain Management Adequate: Adequate How does your wound impact your activities of daily livingo Sleep: No Bathing: No Appetite: No Relationship With Others: No Bladder Continence: No Emotions: No Bowel Continence: No Work: No Toileting: No Drive: No Dressing: No HobbiesDARCIA, Brandi [name] ([id number]) [id number] Page 6 of 6 Electronic Signature(s) Signed: 01/06/2022 3:23:10 PM By: Deon Pilling RN, BSN Entered By: Deon Pilling on 01/06/2022 10:16:31 -------------------------------------------------------------------------------- Patient/Caregiver Education Details Patient Name: Date of Service: Brandi [name] 10/13/2023andnbsp9:45 A M Medical Record Number: [id number] Patient Account Number: [id number] Date of Birth/Gender: Treating RN: [name] (66 y.o. Brandi [name] Primary Care Physician: Brandi [name] Other Clinician: Referring Physician: Treating Physician/Extender: Brandi [name] in Treatment: 0 Education Assessment Education Provided To: Patient Education Topics Provided Welcome T [facility]: o Handouts: Welcome T [facility] [place] o Methods: Explain/Verbal Responses: Reinforcements needed Electronic Signature(s) Signed: 01/06/2022 3:23:10 PM By: Deon Pilling RN, BSN Entered By: Deon Pilling on 01/06/2022 10:35:33 -------------------------------------------------------------------------------- Vitals Details Patient Name: Date of Service: Brandi [name]. 01/06/2022 9:45 A M Medical Record Number: [id number] Patient Account Number: [id number] Date of Birth/Sex: Treating RN: [birth year] (66 y.o. Brandi [name], Brandi [name] Primary Care Zakhia Seres: Brandi [name] Other Clinician: Referring Rylin Saez: Treating N[place]ard/Extender: Brandi [name] Weeks in Treatment: 0 Vital  Signs Time Taken: 10:11 Temperature (F): 98.6 Height (in): 64 Pulse (bpm): 84 Source: Stated Respiratory Rate (breaths/min): 20 Weight (lbs): 205 Blood Pressure (mmHg): 159/103 Source: Stated Reference Range: 80 - 120 mg / dl Body Mass Index (BMI): 35.2 Electronic Signature(s) Signed: 01/06/2022 3:23:10 PM By: Deon Pilling RN, BSN Entered By: Deon Pilling on 01/06/2022 10:14:27

## 2022-01-11 ENCOUNTER — Ambulatory Visit: Payer: Medicare Other | Admitting: Bariatrics

## 2022-01-13 ENCOUNTER — Other Ambulatory Visit: Payer: Self-pay | Admitting: Nurse Practitioner

## 2022-01-13 DIAGNOSIS — M62830 Muscle spasm of back: Secondary | ICD-10-CM

## 2022-01-16 ENCOUNTER — Other Ambulatory Visit: Payer: Self-pay

## 2022-01-16 MED ORDER — NEBIVOLOL HCL 20 MG PO TABS: ORAL_TABLET | ORAL | 3 refills | Status: DC

## 2022-01-16 NOTE — Telephone Encounter (Signed)
Pt's medication was sent to pt's pharmacy as requested. Confirmation received.  °

## 2022-01-18 ENCOUNTER — Encounter: Payer: Self-pay | Admitting: Nurse Practitioner

## 2022-01-18 ENCOUNTER — Other Ambulatory Visit (HOSPITAL_BASED_OUTPATIENT_CLINIC_OR_DEPARTMENT_OTHER): Payer: Self-pay

## 2022-01-18 ENCOUNTER — Ambulatory Visit (INDEPENDENT_AMBULATORY_CARE_PROVIDER_SITE_OTHER): Payer: Medicare Other | Admitting: Nurse Practitioner

## 2022-01-18 ENCOUNTER — Encounter: Payer: Self-pay | Admitting: Interventional Cardiology

## 2022-01-18 VITALS — BP 112/77 | Ht 64.0 in | Wt 197.0 lb

## 2022-01-18 DIAGNOSIS — Z6833 Body mass index (BMI) 33.0-33.9, adult: Secondary | ICD-10-CM | POA: Diagnosis not present

## 2022-01-18 DIAGNOSIS — Z7985 Long-term (current) use of injectable non-insulin antidiabetic drugs: Secondary | ICD-10-CM

## 2022-01-18 DIAGNOSIS — E119 Type 2 diabetes mellitus without complications: Secondary | ICD-10-CM

## 2022-01-18 DIAGNOSIS — E669 Obesity, unspecified: Secondary | ICD-10-CM | POA: Diagnosis not present

## 2022-01-18 MED ORDER — OZEMPIC (2 MG/DOSE) 8 MG/3ML ~~LOC~~ SOPN
2.0000 mg | PEN_INJECTOR | SUBCUTANEOUS | 0 refills | Status: DC
  Filled 2022-01-18: qty 3, 28d supply, fill #0

## 2022-01-23 ENCOUNTER — Telehealth: Payer: Self-pay | Admitting: Neurology

## 2022-01-23 NOTE — Progress Notes (Signed)
Chief Complaint:   OBESITY Brandi [name] is here to discuss her progress with her obesity treatment plan along with follow-up of her obesity related diagnoses. Brandi [name] is on the Category 1 Plan and states she is following her eating plan approximately 90% of the time. Brandi [name] states she is exercising 0 minutes 0 times per week.  Today's visit was #: 18 Starting weight: 273 lbs Starting date: 08/25/2020 Today's weight: 197 lbs Today's date: 01/18/2022 Total lbs lost to date: 76 lbs Total lbs lost since last in-office visit: 6  Interim History: Brandi [name] has done well with weight loss since her last visit. Highest weight: 330 lbs. Doing well with Cat 1 plan. Substituting a protein shake or yogurt with fruit for breakfast. Denies polyphagia or cravings. Drinking water, tea, and Green tea.  Subjective:   1. Type 2 diabetes mellitus without complication, without long-term current use of insulin (HCC) Brandi [name] is taking Ozempic 2 mg. Denies any side effects. On a statin and ARB.  Assessment/Plan:   1. Type 2 diabetes mellitus without complication, without long-term current use of insulin (HCC) We will refill Ozempic 2 mg once a week for 1 month with 0 refills. Side effects discussed.   Good blood sugar control is important to decrease the likelihood of diabetic complications such as nephropathy, neuropathy, limb loss, blindness, coronary artery disease, and death. Intensive lifestyle modification including diet, exercise and weight loss are the first line of treatment for diabetes.    -Refill Semaglutide, 2 MG/DOSE, (OZEMPIC, 2 MG/DOSE,) 8 MG/3ML SOPN; Inject 2 mg into the skin once a week.  Dispense: 3 mL; Refill: 0  2. Obesity, Current BMI 33.9 Brandi [name] is currently in the action stage of change. As such, her goal is to continue with weight loss efforts. She has agreed to the Category 1 Plan.   Brandi [name] will keep appointment with PCP on Nov 7th for follow up and labs.  Exercise goals: Older adults should  follow the adult guidelines. When older adults cannot meet the adult guidelines, they should be as physically active as their abilities and conditions will allow.   Behavioral modification strategies: increasing water intake, no skipping meals, and meal planning and cooking strategies.  Brandi [name] has agreed to follow-up with our clinic in 4 weeks. She was informed of the importance of frequent follow-up visits to maximize her success with intensive lifestyle modifications for her multiple health conditions.   Objective:   Blood pressure 112/77, height '5\' 4"'$  (1.626 m), weight 197 lb (89.4 kg). Body mass index is 33.81 kg/m.  General: Cooperative, alert, well developed, in no acute distress. HEENT: Conjunctivae and lids unremarkable. Cardiovascular: Regular rhythm.  Lungs: Normal work of breathing. Neurologic: No focal deficits.   Lab Results  Component Value Date   CREATININE 0.93 09/28/2021   BUN 20 09/28/2021   NA 139 09/28/2021   K 4.2 09/28/2021   CL 103 09/28/2021   CO2 23 09/28/2021   Lab Results  Component Value Date   ALT 14 06/14/2021   AST 15 06/14/2021   ALKPHOS 166 (H) 06/14/2021   BILITOT 0.3 06/14/2021   Lab Results  Component Value Date   HGBA1C 5.3 09/28/2021   HGBA1C 5.5 06/14/2021   HGBA1C 5.8 (H) 08/25/2020   HGBA1C 6.0 (H) 06/30/2020   HGBA1C 6.0 (H) 03/11/2020   Lab Results  Component Value Date   INSULIN 19.6 08/25/2020   Lab Results  Component Value Date   TSH 1.880 08/25/2020   Lab Results  Component  Value Date   CHOL 111 09/28/2021   HDL 46 09/28/2021   LDLCALC 49 09/28/2021   TRIG 79 09/28/2021   CHOLHDL 2.4 09/28/2021   Lab Results  Component Value Date   VD25OH 48.4 06/14/2021   VD25OH 105.0 (H) 08/25/2020   Lab Results  Component Value Date   WBC 7.5 06/14/2021   HGB 12.6 06/14/2021   HCT 36.5 06/14/2021   MCV 91 06/14/2021   PLT 269 06/14/2021   No results found for: "IRON", "TIBC", "FERRITIN"  Attestation Statements:    Reviewed by clinician on day of visit: allergies, medications, problem list, medical history, surgical history, family history, social history, and previous encounter notes.  I, Brendell Tyus, RMA, am acting as transcriptionist for Everardo Pacific, FNP.  I have reviewed the above documentation for accuracy and completeness, and I agree with the above. Everardo Pacific, FNP

## 2022-01-23 NOTE — Telephone Encounter (Signed)
I called and left a message on the listed phone [phone] for this patient with on answering machine to call me back to discuss EMG results.  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  Patient was advised to call me back to discuss management.

## 2022-01-24 ENCOUNTER — Other Ambulatory Visit (HOSPITAL_BASED_OUTPATIENT_CLINIC_OR_DEPARTMENT_OTHER): Payer: Self-pay

## 2022-01-31 ENCOUNTER — Encounter: Payer: Self-pay | Admitting: Nurse Practitioner

## 2022-01-31 ENCOUNTER — Ambulatory Visit (INDEPENDENT_AMBULATORY_CARE_PROVIDER_SITE_OTHER): Payer: Medicare Other | Admitting: Nurse Practitioner

## 2022-01-31 VITALS — BP 136/72 | HR 96 | Temp 99.0°F | Ht 64.0 in | Wt 203.2 lb

## 2022-01-31 DIAGNOSIS — E782 Mixed hyperlipidemia: Secondary | ICD-10-CM

## 2022-01-31 DIAGNOSIS — M5441 Lumbago with sciatica, right side: Secondary | ICD-10-CM | POA: Diagnosis not present

## 2022-01-31 DIAGNOSIS — Z23 Encounter for immunization: Secondary | ICD-10-CM | POA: Diagnosis not present

## 2022-01-31 DIAGNOSIS — E669 Obesity, unspecified: Secondary | ICD-10-CM | POA: Diagnosis not present

## 2022-01-31 DIAGNOSIS — E1169 Type 2 diabetes mellitus with other specified complication: Secondary | ICD-10-CM | POA: Diagnosis not present

## 2022-01-31 DIAGNOSIS — I110 Hypertensive heart disease with heart failure: Secondary | ICD-10-CM | POA: Diagnosis not present

## 2022-01-31 NOTE — Progress Notes (Signed)
I,Rokia Bosket,acting as a Education administrator for [name], FNP.,have documented all relevant documentation on the behalf of [name], FNP,as directed by  [name], FNP while in the presence of [name], [place].    Subjective:     Patient ID: Brandi [name] , female    DOB: [birth year] , 66 y.o.   MRN: [id number]   Chief Complaint  Patient presents with   Diabetes    HPI  Pt is here today for a BPC and DM f/u. Patient states her blood sugar was 95 this morning. Patient states complaince with her medications. Patient states her sciatic nerve has been really bothering her lately. She is doing her stretching.   She is having surgery in December on her right arm for carpal tunnel. Patient states her boil is now gone.   BP Readings from Last 3 Encounters: 01/31/22 : (!) 140/72 01/18/22 : 112/77 01/06/22 : (!) 140/80    Diabetes She presents for her follow-up diabetic visit. She has type 2 diabetes mellitus. Her disease course has been stable. Pertinent negatives for hypoglycemia include no dizziness or headaches. Pertinent negatives for diabetes include no blurred vision, no chest pain, no fatigue, no polydipsia, no polyphagia and no polyuria. Symptoms are stable. There are no diabetic complications. Risk factors for coronary artery disease include dyslipidemia, obesity and sedentary lifestyle. Current diabetic treatment includes diet. She is compliant with treatment all of the time. Home blood sugar record trend: fluctuating. (Blood sugar today was 93)  Hypertension This is a chronic problem. The current episode started more than 1 year ago. The problem has been gradually improving since onset. The problem is uncontrolled. Pertinent negatives include no anxiety, blurred vision, chest pain, headaches or palpitations. Risk factors for coronary artery disease include obesity, dyslipidemia and sedentary lifestyle. Past treatments include calcium channel blockers. There are no compliance problems.   There is no history of angina. There is no history of chronic renal disease.     Past Medical History:  Diagnosis Date   Anemia    Arthritis    Back pain    Edema, lower extremity    Food allergy    GERD (gastroesophageal reflux disease)    Hypertension    Hypertensive crisis    Joint pain    Morbid obesity ([place])    Osteoporosis    Pre-diabetes    Rheumatoid arthritis ([place])    Sciatica neuralgia    Sleep apnea      Family History  Problem Relation Age of Onset   Hypertension Mother    Cancer Mother    Heart disease Mother    Eating disorder Mother    Obesity Mother    Kidney disease Father    Heart disease Father    Diabetes Father    High blood pressure Father    High Cholesterol Father    Anxiety disorder Father    Bipolar disorder Father    Schizophrenia Father    Hypertension Sister      Current Outpatient Medications:    amLODipine (NORVASC) 5 MG tablet, Take 1.5 tablets (7.5 mg total) by mouth daily., Disp: 135 tablet, Rfl: 0   atorvastatin (LIPITOR) 20 MG tablet, Take 1 tablet (20 mg total) by mouth daily., Disp: 90 tablet, Rfl: 3   Biotin 1000 MCG tablet, Take 1,000 mcg by mouth every morning., Disp: , Rfl:    cetirizine (ZYRTEC) 10 MG tablet, Take 10 mg by mouth every morning., Disp: , Rfl:    Cyanocobalamin (VITAMIN  B-12 PO), Take 1 drop by mouth daily., Disp: , Rfl:    doxycycline (MONODOX) 100 MG capsule, Take 1 capsule (100 mg total) by mouth 2 (two) times daily., Disp: 20 capsule, Rfl: 0   fluticasone (FLONASE) 50 MCG/ACT nasal spray, 1 spray daily as needed for allergies or rhinitis., Disp: , Rfl:    Magnesium 500 MG TABS, Take 500 mg by mouth at bedtime., Disp: , Rfl:    mupirocin ointment (BACTROBAN) 2 %, Apply 1 Application topically daily., Disp: 22 g, Rfl: 0   Nebivolol HCl (BYSTOLIC) 20 MG TABS, Take 1 tablet by mouth once daily, Disp: 90 tablet, Rfl: 3   omeprazole (PRILOSEC OTC) 20 MG tablet, Take 20 mg by mouth every morning., Disp: , Rfl:     OVER THE COUNTER MEDICATION, Take 30 mg by mouth 2 (two) times daily. Strengtia 30 mg, Disp: , Rfl:    OVER THE COUNTER MEDICATION, Take 1 tablet by mouth 2 (two) times daily. Hepato - C1, Disp: , Rfl:    OVER THE COUNTER MEDICATION, Take 1 capsule by mouth 2 (two) times daily. Omega CO3-SE, Disp: , Rfl:    tizanidine (ZANAFLEX) 2 MG capsule, TAKE 1 CAPSULE(2 MG) BY MOUTH THREE TIMES DAILY AS NEEDED FOR MUSCLE SPASMS, Disp: 60 capsule, Rfl: 1   topiramate (TOPAMAX) 50 MG tablet, Take 1 tablet (50 mg total) by mouth 2 (two) times daily., Disp: 180 tablet, Rfl: 3   traMADol (ULTRAM) 50 MG tablet, Take 1 tablet (50 mg total) by mouth every 6 (six) hours as needed (pain)., Disp: 20 tablet, Rfl: 0   triamcinolone ointment (KENALOG) 0.1 %, Apply 1 Application topically 2 (two) times daily., Disp: 30 g, Rfl: 2   valsartan (DIOVAN) 160 MG tablet, Take 1 tablet (160 mg total) by mouth daily., Disp: 90 tablet, Rfl: 0   Vitamin E 180 MG CAPS, Take 180 mg by mouth every morning., Disp: , Rfl:    Semaglutide, 2 MG/DOSE, (OZEMPIC, 2 MG/DOSE,) 8 MG/3ML SOPN, Inject 2 mg into the skin once a week., Disp: 3 mL, Rfl: 0   Allergies  Allergen Reactions   Hydralazine Itching   Meperidine Hcl Rash and Other (See Comments)    hallucinations   Aliskiren Hives   Aspirin Nausea Only   Juniper Berries Hives   Latex Hives   Rofecoxib Diarrhea    Reaction to Vioxx   Spironolactone Other (See Comments)    High doses caused nausea and rash - may can tolerate low doses   Chocolate Nausea And Vomiting and Rash   Furosemide Other (See Comments)    Rash and extreme dehydration   Oxycodone-Acetaminophen Itching and Rash   Sulfa Antibiotics Rash     Review of Systems  Constitutional: Negative.  Negative for fatigue.  Eyes:  Negative for blurred vision.  Respiratory: Negative.    Cardiovascular: Negative.  Negative for chest pain, palpitations and leg swelling.  Endocrine: Negative for polydipsia, polyphagia and  polyuria.  Neurological: Negative.  Negative for dizziness and headaches.  Psychiatric/Behavioral: Negative.       Today's Vitals   01/31/22 1125 01/31/22 1140  BP: (!) 140/72 136/72  Pulse: 96   Temp: 99 F (37.2 C)   TempSrc: Oral   Weight: 203 lb 3.2 oz (92.2 kg)   Height: _0  (1.626 m)   PainSc: 7    PainLoc: Leg    Body mass index is 34.88 kg/m.  Wt Readings from Last 3 Encounters:  02/15/22 197 lb (89.4 kg)  01/31/22 203 lb 3.2 oz (92.2 kg)  01/18/22 197 lb (89.4 kg)    Objective:  Physical Exam Vitals reviewed.  Constitutional:      General: She is not in acute distress.    Appearance: Normal appearance. She is obese.  Eyes:     Pupils: Pupils are equal, round, and reactive to light.  Cardiovascular:     Rate and Rhythm: Normal rate and regular rhythm.     Pulses: Normal pulses.     Heart sounds: Normal heart sounds. No murmur heard. Pulmonary:     Effort: Pulmonary effort is normal. No respiratory distress.     Breath sounds: Normal breath sounds. No wheezing.  Musculoskeletal:        General: No swelling or tenderness. Normal range of motion.     Right lower leg: No edema.     Left lower leg: No edema.  Skin:    General: Skin is warm and dry.     Capillary Refill: Capillary refill takes less than 2 seconds.  Neurological:     General: No focal deficit present.     Mental Status: She is alert and oriented to person, place, and time.     Cranial Nerves: No cranial nerve deficit.     Motor: No weakness.  Psychiatric:        Mood and Affect: Mood normal.        Behavior: Behavior normal.        Thought Content: Thought content normal.        Judgment: Judgment normal.         Assessment And Plan:     1. Diabetes mellitus type 2 in obese (HCC) Comments: Stable. Continue current medications. - Hemoglobin A1c  2. Hypertensive heart disease with heart failure ([place]) Comments: Blood pressure is well controlled, continue current medications. Repeat  blood pressure is improved with rechec, continue with lifestyle changes - BMP8+eGFR  3. Mixed hyperlipidemia Comments: Contineue statin, tolerating well.  4. Acute right-sided low back pain with right-sided sciatica Comments: She has been having intermittent flares, encouraged to stretch regularly  5. Morbid obesity ([place]) Comments: Continue f/u with [facility]  6. Immunization due Comments: Pneumonia 23 done in office - Pneumococcal polysaccharide vaccine 23-valent greater than or equal to 2yo subcutaneous/IM     Patient was given opportunity to ask questions. Patient verbalized understanding of the plan and was able to repeat key elements of the plan. All questions were answered to their satisfaction.  [name], FNP   I, [name], FNP, have reviewed all documentation for this visit. The documentation on 01/31/22 for the exam, diagnosis, procedures, and orders are all accurate and complete.    IF YOU HAVE BEEN REFERRED TO A SPECIALIST, IT MAY TAKE 1-2 WEEKS TO SCHEDULE/PROCESS THE REFERRAL. IF YOU HAVE NOT HEARD FROM US/SPECIALIST IN TWO WEEKS, PLEASE GIVE [name] A CALL AT [phone] X [phone].   THE PATIENT IS ENCOURAGED TO PRACTICE SOCIAL DISTANCING DUE TO THE COVID-19 PANDEMIC.

## 2022-01-31 NOTE — Patient Instructions (Signed)

## 2022-02-01 LAB — BMP8+EGFR
BUN/Creatinine Ratio: 20 (ref 12–28)
BUN: 20 mg/dL (ref 8–27)
CO2: 23 mmol/L (ref 20–29)
Calcium: 9.7 mg/dL (ref 8.7–10.3)
Chloride: 104 mmol/L (ref 96–106)
Creatinine, Ser: 0.99 mg/dL (ref 0.57–1.00)
Glucose: 84 mg/dL (ref 70–99)
Potassium: 4.3 mmol/L (ref 3.5–5.2)
Sodium: 140 mmol/L (ref 134–144)
eGFR: 62 mL/min/{1.73_m2} (ref >59)

## 2022-02-01 LAB — HEMOGLOBIN A1C
Est. average glucose Bld gHb Est-mCnc: 105 mg/dL
Hgb A1c MFr Bld: 5.3 % (ref 4.8–5.6)

## 2022-02-02 ENCOUNTER — Ambulatory Visit: Payer: Self-pay

## 2022-02-02 NOTE — Patient Outreach (Signed)
  Care Coordination   Follow Up Visit Note   02/02/2022 Name: Brandi [name] MRN: [id number] DOB: 10-24-1955  Brandi [name] is a 66 y.o. year old female who sees Brandi Brine, FNP for primary care. I spoke with  Brandi [name] by phone today.  What matters to the patients health and wellness today?  Patient is experiencing increased Sciatic nerve pain.     Goals Addressed               This Visit's Progress     Patient Stated     I am having more sciatica pain (pt-stated)        Care Coordination Interventions: Reviewed provider established plan for pain management Counseled on the importance of reporting any/all new or changed pain symptoms or management strategies to pain management provider Advised patient to report to care team affect of pain on daily activities Reviewed with patient prescribed pharmacological and nonpharmacological pain relief strategies           SDOH assessments and interventions completed:  No     Care Coordination Interventions Activated:  Yes  Care Coordination Interventions:  Yes, provided   Follow up plan: Follow up call scheduled for 03/30/22 '@11'$ :00 AM     Encounter Outcome:  Pt. Visit Completed

## 2022-02-02 NOTE — Patient Instructions (Signed)
Visit Information  Thank you for taking time to visit with me today. Please don't hesitate to contact me if I can be of assistance to you.   Following are the goals we discussed today:   Goals Addressed               This Visit's Progress     Patient Stated     I am having more sciatica pain (pt-stated)        Care Coordination Interventions: Reviewed provider established plan for pain management Counseled on the importance of reporting any/all new or changed pain symptoms or management strategies to pain management provider Advised patient to report to care team affect of pain on daily activities Reviewed with patient prescribed pharmacological and nonpharmacological pain relief strategies           Our next appointment is by telephone on 03/30/22 at 11:00 AM  Please call the care guide team at [phone] if you need to cancel or reschedule your appointment.   If you are experiencing a Mental Health or [place] or need someone to talk to, please call 1-800-273-TALK (toll free, 24 hour hotline)  Patient verbalizes understanding of instructions and care plan provided today and agrees to view in [place]. Active MyChart status and patient understanding of how to access instructions and care plan via MyChart confirmed with patient.     Barb Merino, RN, BSN, CCM Care Management Coordinator [facility]  Direct Phone: [phone]

## 2022-02-15 ENCOUNTER — Other Ambulatory Visit (HOSPITAL_BASED_OUTPATIENT_CLINIC_OR_DEPARTMENT_OTHER): Payer: Self-pay

## 2022-02-15 ENCOUNTER — Ambulatory Visit (INDEPENDENT_AMBULATORY_CARE_PROVIDER_SITE_OTHER): Payer: Medicare Other | Admitting: Bariatrics

## 2022-02-15 ENCOUNTER — Encounter: Payer: Self-pay | Admitting: Bariatrics

## 2022-02-15 VITALS — BP 154/96 | HR 79 | Temp 97.9°F | Ht 64.0 in | Wt 197.0 lb

## 2022-02-15 DIAGNOSIS — Z7985 Long-term (current) use of injectable non-insulin antidiabetic drugs: Secondary | ICD-10-CM

## 2022-02-15 DIAGNOSIS — E119 Type 2 diabetes mellitus without complications: Secondary | ICD-10-CM | POA: Insufficient documentation

## 2022-02-15 DIAGNOSIS — M25552 Pain in left hip: Secondary | ICD-10-CM | POA: Diagnosis not present

## 2022-02-15 DIAGNOSIS — Z6833 Body mass index (BMI) 33.0-33.9, adult: Secondary | ICD-10-CM | POA: Diagnosis not present

## 2022-02-15 DIAGNOSIS — E669 Obesity, unspecified: Secondary | ICD-10-CM

## 2022-02-15 DIAGNOSIS — Z6841 Body Mass Index (BMI) 40.0 and over, adult: Secondary | ICD-10-CM

## 2022-02-15 DIAGNOSIS — M545 Low back pain, unspecified: Secondary | ICD-10-CM | POA: Diagnosis not present

## 2022-02-15 DIAGNOSIS — E7849 Other hyperlipidemia: Secondary | ICD-10-CM

## 2022-02-15 DIAGNOSIS — E66813 Obesity, class 3: Secondary | ICD-10-CM | POA: Insufficient documentation

## 2022-02-15 HISTORY — DX: Morbid (severe) obesity due to excess calories: E66.01

## 2022-02-15 HISTORY — DX: Body Mass Index (BMI) 40.0 and over, adult: Z684

## 2022-02-15 MED ORDER — OZEMPIC (2 MG/DOSE) 8 MG/3ML ~~LOC~~ SOPN
2.0000 mg | PEN_INJECTOR | SUBCUTANEOUS | 0 refills | Status: DC
  Filled 2022-02-15: qty 3, 28d supply, fill #0

## 2022-02-27 ENCOUNTER — Encounter: Payer: Self-pay | Admitting: Bariatrics

## 2022-02-27 ENCOUNTER — Telehealth: Payer: Self-pay | Admitting: *Deleted

## 2022-02-27 ENCOUNTER — Telehealth: Payer: Self-pay | Admitting: Interventional Cardiology

## 2022-02-27 NOTE — Telephone Encounter (Signed)
   Pre-operative Risk Assessment    Patient Name: Brandi [name]  DOB: April 13, 1955 MRN: [id number]      Request for Surgical Clearance    Procedure:   Right Ulna Neuroplasty at the Elbow and Endoscopic Carpal Tunnel Relief  Date of Surgery:  Clearance 03/01/22                                 Surgeon:  Dr. Milly Jakob Surgeon's Group or Practice Name:  [facility] Phone number:  [phone]  Fax number:  [phone]   Type of Clearance Requested:   - Medical  - Pharmacy:  Hold        Type of Anesthesia:  MAC   Additional requests/questions:   Caller stated they will be using MAC with pre-op block.  Caller stated they will need to hold the patient's blood thinner.  Signed, Heloise Beecham   02/27/2022, 2:40 PM

## 2022-02-27 NOTE — Telephone Encounter (Signed)
   Name: Brandi [name]  DOB: [birth year]  MRN: [id number]  Primary Cardiologist: Sinclair Grooms, MD   Preoperative team, please contact this patient and set up a phone call appointment for further preoperative risk assessment. Please obtain consent and complete medication review. Thank you for your help. TIME SENSITIVE  I confirm that guidance regarding antiplatelet and oral anticoagulation therapy has been completed and, if necessary, noted below. (We do not have the patient listed on any blood thinners  - please clarify when doing med rec and notify if she is on any new blood thinners that we do not have listed)   Charlie Pitter, PA-C 02/27/2022, 3:24 PM [place]

## 2022-02-27 NOTE — Progress Notes (Signed)
Chief Complaint:   OBESITY Brandi [name] is here to discuss her progress with her obesity treatment plan along with follow-up of her obesity related diagnoses. Brandi [name] is on the Category 1 Plan and states she is following her eating plan approximately 100% of the time. Brandi [name] states she is doing 0 minutes 0 times per week.  Today's visit was #: 69 Starting weight: 273 lbs Starting date: 08/25/2020 Today's weight: 197 lbs Today's date: 02/15/2022 Total lbs lost to date: 76 Total lbs lost since last in-office visit: 0  Interim History: Brandi [name]'s weight remains the same. She had a fall and she has had a follow-up with no fracture. She is on the road to recovery. Her surgery is scheduled for 02/27/2022.  Subjective:   1. Type 2 diabetes mellitus without complication, without long-term current use of insulin (HCC) Brandi [name] is taking Ozempic, and she denies side effects.   2. Other hyperlipidemia Brandi [name] is taking Lipitor currently.   Assessment/Plan:   1. Type 2 diabetes mellitus without complication, without long-term current use of insulin (Brandi [name]) Brandi [name] will continue Ozempic 2 mg once weekly, and we will refill for 1 month. She will stop Ozempic 2 weeks before surgery.   - Semaglutide, 2 MG/DOSE, (OZEMPIC, 2 MG/DOSE,) 8 MG/3ML SOPN; Inject 2 mg into the skin once a week.  Dispense: 3 mL; Refill: 0  2. Other hyperlipidemia Brandi [name] will continue Lipitor, and she will continue to work on eliminating trans fats.   3. Obesity, Current BMI 33.9 Brandi [name] is currently in the action stage of change. As such, her goal is to continue with weight loss efforts. She has agreed to the Category 1 Plan.   Meal planning and intentional eating were discussed.   Exercise goals: No exercise has been prescribed at this time (due to surgery).  Behavioral modification strategies: increasing lean protein intake, decreasing simple carbohydrates, increasing vegetables, increasing water intake, decreasing eating out, no  skipping meals, meal planning and cooking strategies, keeping healthy foods in the home, and planning for success.  Brandi [name] has agreed to follow-up with our clinic in 4 weeks. She was informed of the importance of frequent follow-up visits to maximize her success with intensive lifestyle modifications for her multiple health conditions.   Objective:   Blood pressure (!) 154/96, pulse 79, temperature 97.9 F (36.6 C), height '5\' 4"'$  (1.626 m), weight 197 lb (89.4 kg), SpO2 100 %. Body mass index is 33.81 kg/m.  General: Cooperative, alert, well developed, in no acute distress. HEENT: Conjunctivae and lids unremarkable. Cardiovascular: Regular rhythm.  Lungs: Normal work of breathing. Neurologic: No focal deficits.   Lab Results  Component Value Date   CREATININE 0.99 01/31/2022   BUN 20 01/31/2022   NA 140 01/31/2022   K 4.3 01/31/2022   CL 104 01/31/2022   CO2 23 01/31/2022   Lab Results  Component Value Date   ALT 14 06/14/2021   AST 15 06/14/2021   ALKPHOS 166 (H) 06/14/2021   BILITOT 0.3 06/14/2021   Lab Results  Component Value Date   HGBA1C 5.3 01/31/2022   HGBA1C 5.3 09/28/2021   HGBA1C 5.5 06/14/2021   HGBA1C 5.8 (H) 08/25/2020   HGBA1C 6.0 (H) 06/30/2020   Lab Results  Component Value Date   INSULIN 19.6 08/25/2020   Lab Results  Component Value Date   TSH 1.880 08/25/2020   Lab Results  Component Value Date   CHOL 111 09/28/2021   HDL 46 09/28/2021   LDLCALC 49 09/28/2021   TRIG  79 09/28/2021   CHOLHDL 2.4 09/28/2021   Lab Results  Component Value Date   VD25OH 48.4 06/14/2021   VD25OH 105.0 (H) 08/25/2020   Lab Results  Component Value Date   WBC 7.5 06/14/2021   HGB 12.6 06/14/2021   HCT 36.5 06/14/2021   MCV 91 06/14/2021   PLT 269 06/14/2021   No results found for: "IRON", "TIBC", "FERRITIN"  Attestation Statements:   Reviewed by clinician on day of visit: allergies, medications, problem list, medical history, surgical history, family  history, social history, and previous encounter notes.   Wilhemena Durie, am acting as Location manager for [employer], DO.  I have reviewed the above documentation for accuracy and completeness, and I agree with the above. [name], DO

## 2022-02-27 NOTE — Telephone Encounter (Signed)
  Patient Consent for Virtual Visit         Brandi [name] has provided verbal consent on 02/27/2022 for a virtual visit (video or telephone).   CONSENT FOR VIRTUAL VISIT FOR:  Brandi [name]  By participating in this virtual visit I agree to the following:  I hereby voluntarily request, consent and authorize [facility] and its employed or contracted physicians, physician assistants, nurse practitioners or other licensed health care professionals (the Practitioner), to provide me with telemedicine health care services (the "Services") as deemed necessary by the treating Practitioner. I acknowledge and consent to receive the Services by the Practitioner via telemedicine. I understand that the telemedicine visit will involve communicating with the Practitioner through live audiovisual communication technology and the disclosure of certain medical information by electronic transmission. I acknowledge that I have been given the opportunity to request an in-person assessment or other available alternative prior to the telemedicine visit and am voluntarily participating in the telemedicine visit.  I understand that I have the right to withhold or withdraw my consent to the use of telemedicine in the course of my care at any time, without affecting my right to future care or treatment, and that the Practitioner or I may terminate the telemedicine visit at any time. I understand that I have the right to inspect all information obtained and/or recorded in the course of the telemedicine visit and may receive copies of available information for a reasonable fee.  I understand that some of the potential risks of receiving the Services via telemedicine include:  Delay or interruption in medical evaluation due to technological equipment failure or disruption; Information transmitted may not be sufficient (e.g. poor resolution of images) to allow for appropriate medical decision making by the  Practitioner; and/or  In rare instances, security protocols could fail, causing a breach of personal health information.  Furthermore, I acknowledge that it is my responsibility to provide information about my medical history, conditions and care that is complete and accurate to the best of my ability. I acknowledge that Practitioner's advice, recommendations, and/or decision may be based on factors not within their control, such as incomplete or inaccurate data provided by me or distortions of diagnostic images or specimens that may result from electronic transmissions. I understand that the practice of medicine is not an exact science and that Practitioner makes no warranties or guarantees regarding treatment outcomes. I acknowledge that a copy of this consent can be made available to me via my patient portal ([facility]), or I can request a printed copy by calling the office of [facility].    I understand that my insurance will be billed for this visit.   I have read or had this consent read to me. I understand the contents of this consent, which adequately explains the benefits and risks of the Services being provided via telemedicine.  I have been provided ample opportunity to ask questions regarding this consent and the Services and have had my questions answered to my satisfaction. I give my informed consent for the services to be provided through the use of telemedicine in my medical care

## 2022-02-28 ENCOUNTER — Ambulatory Visit: Payer: Medicare Other | Attending: Cardiovascular Disease | Admitting: Physician Assistant

## 2022-02-28 DIAGNOSIS — Z01810 Encounter for preprocedural cardiovascular examination: Secondary | ICD-10-CM | POA: Diagnosis not present

## 2022-02-28 NOTE — Progress Notes (Signed)
Virtual Visit via Telephone Note   Because of Brandi [name]'s co-morbid illnesses, she is at least at moderate risk for complications without adequate follow up.  This format is felt to be most appropriate for this patient at this time.  The patient did not have access to video technology/had technical difficulties with video requiring transitioning to audio format only (telephone).  All issues noted in this document were discussed and addressed.  No physical exam could be performed with this format.  Please refer to the patient's chart for her consent to telehealth for Brandi [name].  Evaluation Performed:  Preoperative cardiovascular risk assessment _____________   Date:  02/28/2022   Patient ID:  Brandi [name], DOB September 06, 1955, MRN [id number] Patient Location:  Home Provider location:   Office  Primary Care Provider:  [name], FNP Primary Cardiologist:  Sinclair Grooms, MD  Chief Complaint / Patient Profile   66 y.o. y/o female with a h/o severe HTN, hypertensive heart disease, chronic diastolic CHF, multiple medication intolerances, significant bilateral LE edema, morbid obesity, anemia, arthritis, GERD, RA, sciatica, OSA not using CPAP who is pending Right Ulna Neuroplasty at the [facility]bow and Endoscopic Carpal Tunnel Relief  and presents today for telephonic preoperative cardiovascular risk assessment.  History of Present Illness    Brandi [name] is a 66 y.o. female who presents via audio/video conferencing for a telehealth visit today.  Pt was last seen in cardiology clinic on 01/06/22 by Dr. Tamala Julian.  At that time Brandi [name] was doing well.  The patient is now pending procedure as outlined above. Since her last visit, she reports she has continued to do well. She is walking regularly without any angina or dyspnea. BP was mildly elevated at 11/22 outside office, but she reports she'd gotten good news that day so it was up. She has since followed at home and  reports her last BP was 133/82, pulse 73.  Past Medical History    Past Medical History:  Diagnosis Date   Anemia    Arthritis    Back pain    Edema, lower extremity    Food allergy    GERD (gastroesophageal reflux disease)    Hypertension    Hypertensive crisis    Joint pain    Morbid obesity ([facility])    Osteoporosis    Pre-diabetes    Rheumatoid arthritis ([facility])    Sciatica neuralgia    Sleep apnea    Past Surgical History:  Procedure Laterality Date   ABDOMINAL HYSTERECTOMY     PARTIAL   CHOLECYSTECTOMY     PROTESTIC HIPS     PROTESTIC KNEE      Allergies  Allergies  Allergen Reactions   Hydralazine Itching   Meperidine Hcl Rash and Other (See Comments)    hallucinations   Aliskiren Hives   Aspirin Nausea Only   Juniper Berries Hives   Latex Hives   Rofecoxib Diarrhea    Reaction to Vioxx   Spironolactone Other (See Comments)    High doses caused nausea and rash - may can tolerate low doses   Chocolate Nausea And Vomiting and Rash   Furosemide Other (See Comments)    Rash and extreme dehydration   Oxycodone-Acetaminophen Itching and Rash   Sulfa Antibiotics Rash    Home Medications    Prior to Admission medications   Medication Sig Start Date End Date Taking? Authorizing Provider  amLODipine (NORVASC) 5 MG tablet Take 1.5 tablets (7.5 mg total) by  mouth daily. 12/22/21   Belva Crome, MD  atorvastatin (LIPITOR) 20 MG tablet Take 1 tablet (20 mg total) by mouth daily. 01/06/22   Belva Crome, MD  Biotin 1000 MCG tablet Take 1,000 mcg by mouth every morning.    [provider]  cetirizine (ZYRTEC) 10 MG tablet Take 10 mg by mouth every morning.    [provider]  Cyanocobalamin (VITAMIN B-12 PO) Take 1 drop by mouth daily.    [provider]  doxycycline (MONODOX) 100 MG capsule Take 1 capsule (100 mg total) by mouth 2 (two) times daily. 12/27/21   [name], FNP  fluticasone (FLONASE) 50 MCG/ACT nasal spray 1 spray daily  as needed for allergies or rhinitis.    [provider]  Magnesium 500 MG TABS Take 500 mg by mouth at bedtime.    [provider]  mupirocin ointment (BACTROBAN) 2 % Apply 1 Application topically daily. 12/27/21   [name], FNP  Nebivolol HCl (BYSTOLIC) 20 MG TABS Take 1 tablet by mouth once daily 01/16/22   Belva Crome, MD  omeprazole (PRILOSEC OTC) 20 MG tablet Take 20 mg by mouth every morning.    [provider]  OVER THE COUNTER MEDICATION Take 30 mg by mouth 2 (two) times daily. Strengtia 30 mg    [provider]  OVER THE COUNTER MEDICATION Take 1 tablet by mouth 2 (two) times daily. Hepato - C1    [provider]  OVER THE COUNTER MEDICATION Take 1 capsule by mouth 2 (two) times daily. Omega CO3-SE    [provider]  Semaglutide, 2 MG/DOSE, (OZEMPIC, 2 MG/DOSE,) 8 MG/3ML SOPN Inject 2 mg into the skin once a week. 02/15/22   [name] A, DO  tizanidine (ZANAFLEX) 2 MG capsule TAKE 1 CAPSULE(2 MG) BY MOUTH THREE TIMES DAILY AS NEEDED FOR MUSCLE SPASMS 01/13/22   [name], FNP  topiramate (TOPAMAX) 50 MG tablet Take 1 tablet (50 mg total) by mouth 2 (two) times daily. 11/30/21   Garvin Fila, MD  traMADol (ULTRAM) 50 MG tablet Take 1 tablet (50 mg total) by mouth every 6 (six) hours as needed (pain). 10/28/21 10/28/22  [name], FNP  triamcinolone ointment (KENALOG) 0.1 % Apply 1 Application topically 2 (two) times daily. 09/28/21   [name], FNP  valsartan (DIOVAN) 160 MG tablet Take 1 tablet (160 mg total) by mouth daily. 12/05/21   Belva Crome, MD  Vitamin E 180 MG CAPS Take 180 mg by mouth every morning.    [provider]    Physical Exam    Vital Signs:  Brandi [name] does not have vital signs available for review today but reviewed recent VS as above.  Given telephonic nature of communication, physical exam is limited. AAOx3. NAD. Normal affect.  Speech and respirations are  unlabored.  Accessory Clinical Findings    None  Assessment & Plan    1.  Preoperative Cardiovascular Risk Assessment: RCRI 0.9% indicating low CV risk. The patient affirms she has been doing well without any new cardiac symptoms. They are able to achieve over 4 METS without cardiac limitations. Therefore, based on ACC/AHA guidelines, the patient would be at acceptable risk for the planned procedure without further cardiovascular testing. The patient was advised that if she develops new symptoms prior to surgery to contact our office to arrange for a follow-up visit, and she verbalized understanding.  We are asked to weigh on on blood thinners. We  do not have the patient on any blood thinners in our record. Patient confirms she is not on any blood thinners.  A copy of this note will be routed to requesting surgeon.  Time:   Today, I have spent 5 minutes with the patient with telehealth technology discussing medical history, symptoms, and management plan.     Charlie Pitter, PA-C  02/28/2022, 11:05 AM

## 2022-03-01 DIAGNOSIS — G5621 Lesion of ulnar nerve, right upper limb: Secondary | ICD-10-CM | POA: Diagnosis not present

## 2022-03-01 DIAGNOSIS — G5601 Carpal tunnel syndrome, right upper limb: Secondary | ICD-10-CM | POA: Diagnosis not present

## 2022-03-07 NOTE — Progress Notes (Unsigned)
[facility] [address] [place]. [place] [address] [phone]       OFFICE FOLLOW UP VISITNOTE  Brandi [name] Date of Birth:  1955-09-18 Medical Record Number:  [id number]   Referring MD: Sherley Bounds Reason for visit: CTS, neuropathy   No chief complaint on file.     HPI:   Update 03/08/2022 JM: Patient returns for 1-monthfollow-up visit for possible carpal tunnel syndrome.  She was started on topiramate 50 mg twice daily for painful symptoms as well as recommended wrist braces.  [facility] and severe right ulnar motor entrapment neuropathy at the level of the right elbow consistent with cubital tunnel syndrome.      History provided for reference purposes only Update 11/30/2021 Dr. SLeonie Man She returns for follow-up after last visit nearly a year ago.  She is accompanied by her daughter.  Patient states that her chronic daily headaches now seem to have resolved.  She has stopped taking Topamax and takes it only as needed.  She cannot remember the last time she had a significant headache.  She continues to have gait and balance difficulties but does use a cane.  She has had no recent falls or injuries.  Her mild cognitive impairment and memory difficulties appear quite stable and unchanged.  She is still mostly independent and most activities of daily living.  Patient has a new complaint of right hand and finger pain and paresthesias.  She states this is quite bad and her carpal tunnel is acting up.  This is pretty severe mostly at night and she often wakes up in has trouble going back to sleep.  She states her radial 3 fingers are numb all the time.  She does have a wrist extension splint but does not wear it consistently and uses it mostly at night.  She denies any weakness in her hands or grip.  She is eating healthy and has lost about 40 pounds.    Update 12/30/2020 JM: Ms.  MJechreturns for 68-monthollow-up accompanied by her daughter. Patient reports she has been doing well overall without any recent headaches and continued use of topiramate tolerating without side effects.  She does report having a fall approximately 1 month ago thankfully without injury.  She has been having more issues with sciatica and neuropathy and is planned to establish care with chronic conditions of [place].  Daughter is concerned regarding memory loss issues and worsening gait. She moved in with her daughter back in July.  She does endorse increased stressors over the past year. Previously discussed memory loss concerns with evaluation for reversible causes unremarkable.  No further concerns at this time  Update 06/23/2020 JM: Ms. MuLindelleturns for 3-68-monthllow-up regarding chronic daily headaches and history of postconcussive syndrome  Since prior visit, headaches have greatly improved - she is not able to say when her last headache occurred  Currently on topiramate 50 mg twice daily - tolerating without side effects  Does report right hand numbness in the distribution of the median nerve that is typically worse upon awakening in the morning and worsens with certain activities. Reports prior dx of carpal tunnel " many years ago" and used to wear brace.  Denies any weakness or difficulty with right hand function.  No further concerns at this time  Update 02/25/2020 Dr. SetLeonie Manhe returns for follow-up after last visit more than a year ago.  She states memory and  cognitive difficulties have improved.  She however still has almost daily headaches.  She describes headaches been fluctuating in severity from mild to severe but does not last long.  She takes 1 tablet of Tylenol which seems to work somewhat but the headache returns.  She denies any nausea vomiting light or sound sensitivity.  She is ableshe thinks has helped but she has not tried a higher dose.  She also takes gabapentin for  sciatica but takes it only once a day as well as Requip 1 mg twice daily for restless legs which have not been bothering her recently.  She has no new complaints.  Update 12/04/2018 Dr. Leonie Man: She returns for follow-up after last visit 2 months ago.  She is accompanied by her daughter.  Patient states she is doing better.  She had only one fall recently when she felt dizzy and off-balance.  She uses a cane most of the time now and is careful when she gets up and turns.  She had MRI scan of the brain done on 10/31/2018 which was fairly unremarkable except for incidental empty sella.  EEG done on 10/09/2018 was normal.  Vitamin B12, TSH and RPR were normal on 09/26/2018.  Patient states Topamax that has started is helping hand pain and she is able to grip objects better she is not dropping them as much.  She feels her memory difficulties are unchanged.  She has however not been participating in activities for regular stress laxation.  She complains of dizziness but has been taking both Flexeril and gabapentin 3 times daily and she is willing to reduce them as she feels she does not need them as much now.  Initial visit 09/26/2018 Dr. Leonie Man Brandi [name] is a 66 year old pleasant Caucasian lady who is seen today for initial consultation visit.  She is accompanied by her daughter.  History is obtained from her and review of referral notes.  I have reviewed imaging films in PACS.  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  She states she fell without warm warning face forward and sustained bruises on her forehead and face.  She states she did not lose consciousness but daughter of states that she may have briefly lost it.  Patient was seen in the ER in the hospital where apparently brain scan was unremarkable however I do not have those hospital records for my review today.  Since then she has had constant headaches, tremors of her hands, short-term memory difficulties.  She  states her balance is poor.  She has been using a walker at times and she can use a cane also and she wants to.  She is also been dropping objects from her hand intermittently.  She also complains of some occasional word finding difficulties and states her speech is slow.  This symptoms have continued and have not gotten better.  She had a CT scan of the head ordered by Dr. Ronnald Ramp on 09/18/2018 which I personally reviewed shows no acute abnormality.  Patient states she was seeing a neurologist in [place] but I do not have those records either.  She does not remember having an MRI scan done.  She has also chronic back pain and sciatica and she takes Flexeril 10 mg 3 times daily, gabapentin 300 mg 3 times daily and tramadol as needed.  She also takes Requip 1 mg at night for restless legs.  Patient denies any significant increasing headaches, focal  extremity weakness, stroke, TIA, seizures. to work despite the headache and sleeps well at night.  She has been taking Topamax 25 mg once a day which      ROS:   14 system review of systems is positive for those listed in HPI and all other systems negative  PMH:  Past Medical History:  Diagnosis Date   Anemia    Arthritis    Back pain    Edema, lower extremity    Food allergy    GERD (gastroesophageal reflux disease)    Hypertension    Hypertensive crisis    Joint pain    Morbid obesity (HCC)    Osteoporosis    Pre-diabetes    Rheumatoid arthritis ([place])    Sciatica neuralgia    Sleep apnea     Social History:  Social History   Socioeconomic History   Marital status: Single    Spouse name: Not on file   Number of children: Not on file   Years of education: Not on file   Highest education level: Not on file  Occupational History   Occupation: Retired Medical illustrator Nurseb  Tobacco Use   Smoking status: Never   Smokeless tobacco: Never  Vaping Use   Vaping Use: Never used  Substance and Sexual Activity   Alcohol use: Not  Currently   Drug use: Not Currently   Sexual activity: Not Currently  Other Topics Concern   Not on file  Social History Narrative   Lives alone   Right handed   Drinks no caffeine   Social Determinants of Health   Financial Resource Strain: Low Risk  (03/16/2021)   Overall Financial Resource Strain (CARDIA)    Difficulty of Paying Living Expenses: Not hard at all  Food Insecurity: No Food Insecurity (03/16/2021)   Hunger Vital Sign    Worried About Running Out of Food in the Last Year: Never true    Ran Out of Food in the Last Year: Never true  Transportation Needs: No Transportation Needs (03/16/2021)   PRAPARE - Hydrologist (Medical): No    Lack of Transportation (Non-Medical): No  Physical Activity: Insufficiently Active (06/14/2021)   Exercise Vital Sign    Days of Exercise per Week: 2 days    Minutes of Exercise per Session: 40 min  Stress: No Stress Concern Present (03/16/2021)   [place]    Feeling of Stress : Not at all  Social Connections: Unknown (11/04/2018)   Social Connection and Isolation Panel [NHANES]    Frequency of Communication with Friends and Family: More than three times a week    Frequency of Social Gatherings with Friends and Family: More than three times a week    Attends Religious Services: 1 to 4 times per year    Active Member of [employer] or Organizations: No    Attends Archivist Meetings: Never    Marital Status: Not on file  Intimate Partner Violence: Not on file    Medications:   Current Outpatient Medications on File Prior to Visit  Medication Sig Dispense Refill   amLODipine (NORVASC) 5 MG tablet Take 1.5 tablets (7.5 mg total) by mouth daily. 135 tablet 0   atorvastatin (LIPITOR) 20 MG tablet Take 1 tablet (20 mg total) by mouth daily. 90 tablet 3   Biotin 1000 MCG tablet Take 1,000 mcg by mouth every morning.     cetirizine (ZYRTEC)  10 MG tablet Take  10 mg by mouth every morning.     Cyanocobalamin (VITAMIN B-12 PO) Take 1 drop by mouth daily.     doxycycline (MONODOX) 100 MG capsule Take 1 capsule (100 mg total) by mouth 2 (two) times daily. 20 capsule 0   fluticasone (FLONASE) 50 MCG/ACT nasal spray 1 spray daily as needed for allergies or rhinitis.     Magnesium 500 MG TABS Take 500 mg by mouth at bedtime.     mupirocin ointment (BACTROBAN) 2 % Apply 1 Application topically daily. 22 g 0   Nebivolol HCl (BYSTOLIC) 20 MG TABS Take 1 tablet by mouth once daily 90 tablet 3   omeprazole (PRILOSEC OTC) 20 MG tablet Take 20 mg by mouth every morning.     OVER THE COUNTER MEDICATION Take 30 mg by mouth 2 (two) times daily. Strengtia 30 mg     OVER THE COUNTER MEDICATION Take 1 tablet by mouth 2 (two) times daily. Hepato - C1     OVER THE COUNTER MEDICATION Take 1 capsule by mouth 2 (two) times daily. Omega CO3-SE     Semaglutide, 2 MG/DOSE, (OZEMPIC, 2 MG/DOSE,) 8 MG/3ML SOPN Inject 2 mg into the skin once a week. 3 mL 0   tizanidine (ZANAFLEX) 2 MG capsule TAKE 1 CAPSULE(2 MG) BY MOUTH THREE TIMES DAILY AS NEEDED FOR MUSCLE SPASMS 60 capsule 1   topiramate (TOPAMAX) 50 MG tablet Take 1 tablet (50 mg total) by mouth 2 (two) times daily. 180 tablet 3   traMADol (ULTRAM) 50 MG tablet Take 1 tablet (50 mg total) by mouth every 6 (six) hours as needed (pain). 20 tablet 0   triamcinolone ointment (KENALOG) 0.1 % Apply 1 Application topically 2 (two) times daily. 30 g 2   valsartan (DIOVAN) 160 MG tablet Take 1 tablet (160 mg total) by mouth daily. 90 tablet 0   Vitamin E 180 MG CAPS Take 180 mg by mouth every morning.     No current facility-administered medications on file prior to visit.    Allergies:   Allergies  Allergen Reactions   Hydralazine Itching   Meperidine Hcl Rash and Other (See Comments)    hallucinations   Aliskiren Hives   Aspirin Nausea Only   Juniper Berries Hives   Latex Hives   Rofecoxib Diarrhea     Reaction to Vioxx   Spironolactone Other (See Comments)    High doses caused nausea and rash - may can tolerate low doses   Chocolate Nausea And Vomiting and Rash   Furosemide Other (See Comments)    Rash and extreme dehydration   Oxycodone-Acetaminophen Itching and Rash   Sulfa Antibiotics Rash    Physical Exam There were no vitals filed for this visit.   There is no height or weight on file to calculate BMI.  General: Morbidly obese very pleasant middle-aged Caucasian lady, seated, in no evident distress Head: head normocephalic and atraumatic.   Neck: supple with no carotid or supraclavicular bruits Cardiovascular: regular rate and rhythm, no murmurs Musculoskeletal: no deformity Skin:  no rash/petichiae Vascular:  Normal pulses all extremities  Neurologic Exam Mental Status: Awake and fully alert. Oriented to place and time. Recent memory subjectively impaired and remote memory intact. Attention span, concentration and fund of knowledge appropriate. Mood and affect appropriate.   Cranial Nerves:  pupils equal, briskly reactive to light. Extraocular movements full without nystagmus. Visual fields full to confrontation. Hearing intact. Facial sensation intact. Face, tongue, palate moves normally and symmetrically.  Motor: Normal bulk  and tone. Normal strength in all tested extremity muscles. Sensory.: intact to touch , pinprick , position and vibratory sensation except diminished pinprick right radial 3 fingers, Tinel's sign positive over right wrist Coordination: Rapid alternating movements normal in all extremities. Finger-to-nose and heel-to-shin performed accurately bilaterally. Gait and Station: Arises from chair without difficulty.  Uses a cane.  Stance is normal. Gait demonstrates broad-based gait with normal stride length and balance and use of cane. heel, toe and tandem walk not attempted Reflexes: 1+ and symmetric except ankle jerks are depressed bilaterally. Toes  downgoing.       ASSESSMENT/PLAN: 66 year old Caucasian lady with recent diagnosis of right CTS and right ulnar neuropathy.  History of postconcussive syndrome 2018 with residual mild cognitive impairment and gait impairment, resolution of headache.    1.  CTS, right 2.  Ulnar neuropathy, right      Follow-up in 6 months or call earlier if needed   CC:  [facility] provider: Dr. Baron Sane, Doreene Burke, FNP    I spent 34 minutes of face-to-face and non-face-to-face time with patient and daughter.  This included previsit chart review, lab review, study review, electronic health record documentation, patient and daughter education and discussion regarding history of chronic daily headaches and use of topiramate, mild cognitive impairment and gait impairment and likely etiologies and answered all other questions to patient and daughters satisfaction  Frann Rider, AGNP-BC  [name] [address] [place], [place] [address]  Phone [phone] Fax [phone] Note: This document was prepared with digital dictation and possible smart phrase technology. Any transcriptional errors that result from this process are unintentional.

## 2022-03-08 ENCOUNTER — Encounter: Payer: Self-pay | Admitting: Adult Health

## 2022-03-08 ENCOUNTER — Ambulatory Visit (INDEPENDENT_AMBULATORY_CARE_PROVIDER_SITE_OTHER): Payer: Medicare Other | Admitting: Adult Health

## 2022-03-08 VITALS — BP 152/98 | HR 73 | Ht 64.0 in | Wt 207.0 lb

## 2022-03-08 DIAGNOSIS — M5432 Sciatica, left side: Secondary | ICD-10-CM | POA: Diagnosis not present

## 2022-03-08 DIAGNOSIS — G5601 Carpal tunnel syndrome, right upper limb: Secondary | ICD-10-CM | POA: Diagnosis not present

## 2022-03-08 NOTE — Patient Instructions (Addendum)
Please follow up with ortho as scheduled next week for post op of recent surgery and lower back pain  Recommend gradually tapering off topiramate as symptoms have improved. Would recommend stopping morning dosage and continuing with '50mg'$  at night for 1 week then discontinue.    Follow up with [name] as needed   Thank you for coming to see [name] at [facility]. I hope we have been able to provide you high quality care today.  You may receive a patient satisfaction survey over the next few weeks. We would appreciate your feedback and comments so that we may continue to improve ourselves and the health of our patients.

## 2022-03-11 ENCOUNTER — Other Ambulatory Visit: Payer: Self-pay | Admitting: Interventional Cardiology

## 2022-03-11 DIAGNOSIS — I10 Essential (primary) hypertension: Secondary | ICD-10-CM

## 2022-03-13 MED ORDER — VALSARTAN 160 MG PO TABS: 160.0000 mg | ORAL_TABLET | Freq: Every day | ORAL | 3 refills | Status: DC

## 2022-03-13 MED ORDER — AMLODIPINE BESYLATE 5 MG PO TABS: 7.5000 mg | ORAL_TABLET | Freq: Every day | ORAL | 3 refills | Status: DC

## 2022-03-14 ENCOUNTER — Other Ambulatory Visit: Payer: Self-pay | Admitting: Interventional Cardiology

## 2022-03-14 ENCOUNTER — Ambulatory Visit (INDEPENDENT_AMBULATORY_CARE_PROVIDER_SITE_OTHER): Payer: Medicare Other | Admitting: Bariatrics

## 2022-03-14 ENCOUNTER — Other Ambulatory Visit (HOSPITAL_BASED_OUTPATIENT_CLINIC_OR_DEPARTMENT_OTHER): Payer: Self-pay

## 2022-03-14 ENCOUNTER — Encounter: Payer: Self-pay | Admitting: Bariatrics

## 2022-03-14 VITALS — BP 154/97 | HR 92 | Ht 64.0 in | Wt 204.0 lb

## 2022-03-14 DIAGNOSIS — E669 Obesity, unspecified: Secondary | ICD-10-CM | POA: Diagnosis not present

## 2022-03-14 DIAGNOSIS — I10 Essential (primary) hypertension: Secondary | ICD-10-CM

## 2022-03-14 DIAGNOSIS — Z7985 Long-term (current) use of injectable non-insulin antidiabetic drugs: Secondary | ICD-10-CM

## 2022-03-14 DIAGNOSIS — Z6835 Body mass index (BMI) 35.0-35.9, adult: Secondary | ICD-10-CM | POA: Diagnosis not present

## 2022-03-14 DIAGNOSIS — E1169 Type 2 diabetes mellitus with other specified complication: Secondary | ICD-10-CM

## 2022-03-14 MED ORDER — OZEMPIC (2 MG/DOSE) 8 MG/3ML ~~LOC~~ SOPN
2.0000 mg | PEN_INJECTOR | SUBCUTANEOUS | 0 refills | Status: DC
  Filled 2022-03-14 – 2022-04-03 (×2): qty 3, 28d supply, fill #0

## 2022-03-15 ENCOUNTER — Other Ambulatory Visit: Payer: Self-pay | Admitting: Orthopedic Surgery

## 2022-03-15 DIAGNOSIS — M545 Low back pain, unspecified: Secondary | ICD-10-CM

## 2022-03-29 NOTE — Patient Instructions (Signed)

## 2022-03-30 ENCOUNTER — Ambulatory Visit: Payer: Self-pay

## 2022-03-30 NOTE — Patient Outreach (Signed)
  Care Coordination   03/30/2022 Name: Brandi [name] MRN: [id number] DOB: 1955/06/12   Care Coordination Outreach Attempts:  An unsuccessful telephone outreach was attempted for a scheduled appointment today.  Follow Up Plan:  Additional outreach attempts will be made to offer the patient care coordination information and services.   Encounter Outcome:  No Answer   Care Coordination Interventions:  No, not indicated    Barb Merino, RN, BSN, CCM Care Management Coordinator [facility] Care Management Direct Phone: [phone]

## 2022-04-01 ENCOUNTER — Other Ambulatory Visit (HOSPITAL_BASED_OUTPATIENT_CLINIC_OR_DEPARTMENT_OTHER): Payer: Self-pay

## 2022-04-01 ENCOUNTER — Ambulatory Visit
Admission: RE | Admit: 2022-04-01 | Discharge: 2022-04-01 | Disposition: A | Discharge status: Home / Self Care | Payer: Medicare Other | Source: Ambulatory Visit | Attending: Orthopedic Surgery | Admitting: Orthopedic Surgery

## 2022-04-01 DIAGNOSIS — M48061 Spinal stenosis, lumbar region without neurogenic claudication: Secondary | ICD-10-CM | POA: Diagnosis not present

## 2022-04-01 DIAGNOSIS — M545 Low back pain, unspecified: Secondary | ICD-10-CM

## 2022-04-03 ENCOUNTER — Other Ambulatory Visit (HOSPITAL_BASED_OUTPATIENT_CLINIC_OR_DEPARTMENT_OTHER): Payer: Self-pay

## 2022-04-04 ENCOUNTER — Encounter: Payer: Self-pay | Admitting: Bariatrics

## 2022-04-04 NOTE — Progress Notes (Signed)
Chief Complaint:   OBESITY Brandi [name] is here to discuss her progress with her obesity treatment plan along with follow-up of her obesity related diagnoses. Brandi [name] is on the Category 1 Plan and states she is following her eating plan approximately 0% of the time. Brandi [name] states she is doing 0 minutes 0 times per week.  Today's visit was #: 20 Starting weight: 273 lbs Starting date: 08/25/2020 Today's weight: 204 lbs Today's date: 03/14/2022 Total lbs lost to date: 69 Total lbs lost since last in-office visit: 0  Interim History: Brandi [name] is up 7 pounds since her last visit over the holiday.  She will be getting back on her meal plan after [name].  Subjective:   1. Diabetes mellitus type 2 in obese [facility]) Brandi [name] is taking Ozempic as directed with no side effects noted.  2. Essential hypertension Brandi [name] notes increased stress. Her blood pressure is elevated at 154/97.  Assessment/Plan:   1. Diabetes mellitus type 2 in obese [facility]) Brandi [name] will continue Ozempic 2 mg once weekly, and we will refill for 1 month.  - Semaglutide, 2 MG/DOSE, (OZEMPIC, 2 MG/DOSE,) 8 MG/3ML SOPN; Inject 2 mg into the skin once a week.  Dispense: 3 mL; Refill: 0  2. Essential hypertension Brandi [name] will continue her medications, and she will check her blood pressure at home.  3. Obesity, Current BMI 35.0 Brandi [name] is currently in the action stage of change. As such, her goal is to continue with weight loss efforts. She has agreed to the Category 1 Plan.   Meal planning, intentional eating, and holiday eating strategies were discussed.  Exercise goals: No exercise has been prescribed at this time.(Recovering from right hand surgery)  Behavioral modification strategies: increasing lean protein intake, decreasing simple carbohydrates, increasing vegetables, increasing water intake, decreasing eating out, no skipping meals, meal planning and cooking strategies, keeping healthy foods in the home, and planning for  success.  Brandi [name] has agreed to follow-up with our clinic in 4 weeks. She was informed of the importance of frequent follow-up visits to maximize her success with intensive lifestyle modifications for her multiple health conditions.   Objective:   Blood pressure (!) 154/97, pulse 92, height '5\' 4"'$  (1.626 m), weight 204 lb (92.5 kg), SpO2 100 %. Body mass index is 35.02 kg/m.  General: Cooperative, alert, well developed, in no acute distress. HEENT: Conjunctivae and lids unremarkable. Cardiovascular: Regular rhythm.  Lungs: Normal work of breathing. Neurologic: No focal deficits.   Lab Results  Component Value Date   CREATININE 0.99 01/31/2022   BUN 20 01/31/2022   NA 140 01/31/2022   K 4.3 01/31/2022   CL 104 01/31/2022   CO2 23 01/31/2022   Lab Results  Component Value Date   ALT 14 06/14/2021   AST 15 06/14/2021   ALKPHOS 166 (H) 06/14/2021   BILITOT 0.3 06/14/2021   Lab Results  Component Value Date   HGBA1C 5.3 01/31/2022   HGBA1C 5.3 09/28/2021   HGBA1C 5.5 06/14/2021   HGBA1C 5.8 (H) 08/25/2020   HGBA1C 6.0 (H) 06/30/2020   Lab Results  Component Value Date   INSULIN 19.6 08/25/2020   Lab Results  Component Value Date   TSH 1.880 08/25/2020   Lab Results  Component Value Date   CHOL 111 09/28/2021   HDL 46 09/28/2021   LDLCALC 49 09/28/2021   TRIG 79 09/28/2021   CHOLHDL 2.4 09/28/2021   Lab Results  Component Value Date   VD25OH 48.4 06/14/2021   VD25OH 105.0 (H)  08/25/2020   Lab Results  Component Value Date   WBC 7.5 06/14/2021   HGB 12.6 06/14/2021   HCT 36.5 06/14/2021   MCV 91 06/14/2021   PLT 269 06/14/2021   No results found for: "IRON", "TIBC", "FERRITIN"  Attestation Statements:   Reviewed by clinician on day of visit: allergies, medications, problem list, medical history, surgical history, family history, social history, and previous encounter notes.   Wilhemena Durie, am acting as Location manager for [employer], DO.  I  have reviewed the above documentation for accuracy and completeness, and I agree with the above. [name], DO

## 2022-04-05 NOTE — Progress Notes (Signed)
Rescheduled 04/26/22  [place]  Direct Dial: [phone]

## 2022-04-06 ENCOUNTER — Ambulatory Visit (INDEPENDENT_AMBULATORY_CARE_PROVIDER_SITE_OTHER): Payer: Medicare Other

## 2022-04-06 VITALS — Ht 64.0 in | Wt 202.0 lb

## 2022-04-06 DIAGNOSIS — Z0000 Encounter for general adult medical examination without abnormal findings: Secondary | ICD-10-CM | POA: Diagnosis not present

## 2022-04-06 NOTE — Patient Instructions (Signed)
Brandi [name] , Thank you for taking time to come for your Medicare Wellness Visit. I appreciate your ongoing commitment to your health goals. Please review the following plan we discussed and let me know if I can assist you in the future.   These are the goals we discussed:  Goals       I am having more sciatica pain (pt-stated)      Care Coordination Interventions: Reviewed provider established plan for pain management Counseled on the importance of reporting any/all new or changed pain symptoms or management strategies to pain management provider Advised patient to report to care team affect of pain on daily activities Reviewed with patient prescribed pharmacological and nonpharmacological pain relief strategies         Increase physical activity      "increase physical activity"        Patient Stated      03/11/2020, to join a health club      Patient Stated      03/16/2021, lose weight, get hip physically fit      Patient Stated      04/06/2022, increase walking to 45 minutes      Weight (lb) < 200 lb (90.7 kg)      "I would like to drop more weight"         This is a list of the screening recommended for you and due dates:  Health Maintenance  Topic Date Due   Complete foot exam   Never done   Eye exam for diabetics  Never done   COVID-19 Vaccine (6 - 2023-24 season) 11/25/2021   Yearly kidney health urinalysis for diabetes  06/15/2022   Hemoglobin A1C  08/01/2022   Yearly kidney function blood test for diabetes  02/01/2023   Medicare Annual Wellness Visit  04/07/2023   Mammogram  12/24/2023   Cologuard (Stool DNA test)  03/24/2024   DTaP/Tdap/Td vaccine (2 - Tdap) 07/01/2031   Pneumonia Vaccine  Completed   Flu Shot  Completed   DEXA scan (bone density measurement)  Completed   Hepatitis C Screening: USPSTF Recommendation to screen - Ages 8-79 yo.  Completed   Zoster (Shingles) Vaccine  Completed   HPV Vaccine  Aged Out    Advanced directives: Advance  directive discussed with you today.   Conditions/risks identified: none  Next appointment: Follow up in one year for your annual wellness visit    Preventive Care 65 Years and Older, Female Preventive care refers to lifestyle choices and visits with your health care provider that can promote health and wellness. What does preventive care include? A yearly physical exam. This is also called an annual well check. Dental exams once or twice a year. Routine eye exams. Ask your health care provider how often you should have your eyes checked. Personal lifestyle choices, including: Daily care of your teeth and gums. Regular physical activity. Eating a healthy diet. Avoiding tobacco and drug use. Limiting alcohol use. Practicing safe sex. Taking low-dose aspirin every day. Taking vitamin and mineral supplements as recommended by your health care provider. What happens during an annual well check? The services and screenings done by your health care provider during your annual well check will depend on your age, overall health, lifestyle risk factors, and family history of disease. Counseling  Your health care provider may ask you questions about your: Alcohol use. Tobacco use. Drug use. Emotional well-being. Home and relationship well-being. Sexual activity. Eating habits. History of falls. Memory and ability  to understand (cognition). Work and work Statistician. Reproductive health. Screening  You may have the following tests or measurements: Height, weight, and BMI. Blood pressure. Lipid and cholesterol levels. These may be checked every 5 years, or more frequently if you are over 18 years old. Skin check. Lung cancer screening. You may have this screening every year starting at age 66 if you have a 30-pack-year history of smoking and currently smoke or have quit within the past 15 years. Fecal occult blood test (FOBT) of the stool. You may have this test every year starting at  age 73. Flexible sigmoidoscopy or colonoscopy. You may have a sigmoidoscopy every 5 years or a colonoscopy every 10 years starting at age 3. Hepatitis C blood test. Hepatitis B blood test. Sexually transmitted disease (STD) testing. Diabetes screening. This is done by checking your blood sugar (glucose) after you have not eaten for a while (fasting). You may have this done every 1-3 years. Bone density scan. This is done to screen for osteoporosis. You may have this done starting at age 15. Mammogram. This may be done every 1-2 years. Talk to your health care provider about how often you should have regular mammograms. Talk with your health care provider about your test results, treatment options, and if necessary, the need for more tests. Vaccines  Your health care provider may recommend certain vaccines, such as: Influenza vaccine. This is recommended every year. Tetanus, diphtheria, and acellular pertussis (Tdap, Td) vaccine. You may need a Td booster every 10 years. Zoster vaccine. You may need this after age 49. Pneumococcal 13-valent conjugate (PCV13) vaccine. One dose is recommended after age 54. Pneumococcal polysaccharide (PPSV23) vaccine. One dose is recommended after age 28. Talk to your health care provider about which screenings and vaccines you need and how often you need them. This information is not intended to replace advice given to you by your health care provider. Make sure you discuss any questions you have with your health care provider. Document Released: 04/09/2015 Document Revised: 12/01/2015 Document Reviewed: 01/12/2015 Elsevier Interactive Patient Education  2017 [place] Prevention in the Home Falls can cause injuries. They can happen to people of all ages. There are many things you can do to make your home safe and to help prevent falls. What can I do on the outside of my home? Regularly fix the edges of walkways and driveways and fix any  cracks. Remove anything that might make you trip as you walk through a door, such as a raised step or threshold. Trim any bushes or trees on the path to your home. Use bright outdoor lighting. Clear any walking paths of anything that might make someone trip, such as rocks or tools. Regularly check to see if handrails are loose or broken. Make sure that both sides of any steps have handrails. Any raised decks and porches should have guardrails on the edges. Have any leaves, snow, or ice cleared regularly. Use sand or salt on walking paths during winter. Clean up any spills in your garage right away. This includes oil or grease spills. What can I do in the bathroom? Use night lights. Install grab bars by the toilet and in the tub and shower. Do not use towel bars as grab bars. Use non-skid mats or decals in the tub or shower. If you need to sit down in the shower, use a plastic, non-slip stool. Keep the floor dry. Clean up any water that spills on the floor as soon as  it happens. Remove soap buildup in the tub or shower regularly. Attach bath mats securely with double-sided non-slip rug tape. Do not have throw rugs and other things on the floor that can make you trip. What can I do in the bedroom? Use night lights. Make sure that you have a light by your bed that is easy to reach. Do not use any sheets or blankets that are too big for your bed. They should not hang down onto the floor. Have a firm chair that has side arms. You can use this for support while you get dressed. Do not have throw rugs and other things on the floor that can make you trip. What can I do in the kitchen? Clean up any spills right away. Avoid walking on wet floors. Keep items that you use a lot in easy-to-reach places. If you need to reach something above you, use a strong step stool that has a grab bar. Keep electrical cords out of the way. Do not use floor polish or wax that makes floors slippery. If you must  use wax, use non-skid floor wax. Do not have throw rugs and other things on the floor that can make you trip. What can I do with my stairs? Do not leave any items on the stairs. Make sure that there are handrails on both sides of the stairs and use them. Fix handrails that are broken or loose. Make sure that handrails are as long as the stairways. Check any carpeting to make sure that it is firmly attached to the stairs. Fix any carpet that is loose or worn. Avoid having throw rugs at the top or bottom of the stairs. If you do have throw rugs, attach them to the floor with carpet tape. Make sure that you have a light switch at the top of the stairs and the bottom of the stairs. If you do not have them, ask someone to add them for you. What else can I do to help prevent falls? Wear shoes that: Do not have high heels. Have rubber bottoms. Are comfortable and fit you well. Are closed at the toe. Do not wear sandals. If you use a stepladder: Make sure that it is fully opened. Do not climb a closed stepladder. Make sure that both sides of the stepladder are locked into place. Ask someone to hold it for you, if possible. Clearly mark and make sure that you can see: Any grab bars or handrails. First and last steps. Where the edge of each step is. Use tools that help you move around (mobility aids) if they are needed. These include: Canes. Walkers. Scooters. Crutches. Turn on the lights when you go into a dark area. Replace any light bulbs as soon as they burn out. Set up your furniture so you have a clear path. Avoid moving your furniture around. If any of your floors are uneven, fix them. If there are any pets around you, be aware of where they are. Review your medicines with your doctor. Some medicines can make you feel dizzy. This can increase your chance of falling. Ask your doctor what other things that you can do to help prevent falls. This information is not intended to replace  advice given to you by your health care provider. Make sure you discuss any questions you have with your health care provider. Document Released: 01/07/2009 Document Revised: 08/19/2015 Document Reviewed: 04/17/2014 Elsevier Interactive Patient Education  2017 [employer].

## 2022-04-06 NOTE — Progress Notes (Signed)
I connected with Brandi [name] today by telephone and verified that I am speaking with the correct person using two identifiers. Location patient: home Location provider: work Persons participating in the virtual visit: Brandi [name], Brandi [name].   I discussed the limitations, risks, security and privacy concerns of performing an evaluation and management service by telephone and the availability of in person appointments. I also discussed with the patient that there may be a patient responsible charge related to this service. The patient expressed understanding and verbally consented to this telephonic visit.    Interactive audio and video telecommunications were attempted between this provider and patient, however failed, due to patient having technical difficulties OR patient did not have access to video capability.  We continued and completed visit with audio only.     Vital signs may be patient reported or missing.  Subjective:   Brandi [name] is a 67 y.o. female who presents for Medicare Annual (Subsequent) preventive examination.  Review of Systems     Cardiac Risk Factors include: advanced age (>63mn, >>13women);diabetes mellitus;hypertension;obesity (BMI >30kg/m2)     Objective:    Today's Vitals   04/06/22 0956 04/06/22 0957  Weight: 202 lb (91.6 kg)   Height: '5\' 4"'$  (1.626 m)   PainSc:  6    Body mass index is 34.67 kg/m.     04/06/2022   10:06 AM 03/16/2021   11:07 AM 03/14/2021    4:00 PM 02/21/2021    3:43 PM 02/21/2021   11:05 AM 03/11/2020   10:17 AM 03/11/2019   11:42 AM  Advanced Directives  Does Patient Have a Medical Advance Directive? No Yes No Yes No No No  Type of ACorporate treasurerof [place]und[place]Living will  [place] will     Does patient want to make changes to medical advance directive?    No - Patient declined     Copy of [place] Chart?  No - copy requested  No - copy  requested     Would patient like information on creating a medical advance directive?    No - Patient declined No - Patient declined No - Patient declined Yes (MAU/Ambulatory/Procedural Areas - Information given)    Current Medications (verified) Outpatient Encounter Medications as of 04/06/2022  Medication Sig   amLODipine (NORVASC) 5 MG tablet Take 1.5 tablets (7.5 mg total) by mouth daily.   atorvastatin (LIPITOR) 20 MG tablet Take 1 tablet (20 mg total) by mouth daily.   Biotin 1000 MCG tablet Take 1,000 mcg by mouth every morning.   cetirizine (ZYRTEC) 10 MG tablet Take 10 mg by mouth every morning.   Cyanocobalamin (VITAMIN B-12 PO) Take 1 drop by mouth daily.   cyclobenzaprine (FLEXERIL) 10 MG tablet Take 10 mg by mouth 3 (three) times daily.   fluticasone (FLONASE) 50 MCG/ACT nasal spray 1 spray daily as needed for allergies or rhinitis.   Magnesium 500 MG TABS Take 500 mg by mouth at bedtime.   meloxicam (MOBIC) 7.5 MG tablet Take 7.5 mg by mouth daily.   mupirocin ointment (BACTROBAN) 2 % Apply 1 Application topically daily.   Nebivolol HCl (BYSTOLIC) 20 MG TABS Take 1 tablet by mouth once daily   omeprazole (PRILOSEC OTC) 20 MG tablet Take 20 mg by mouth every morning.   OVER THE COUNTER MEDICATION Take 30 mg by mouth 2 (two) times daily. Strengtia 30 mg   OVER THE COUNTER MEDICATION Take 1 tablet by  mouth 2 (two) times daily. Hepato - C1   OVER THE COUNTER MEDICATION Take 1 capsule by mouth 2 (two) times daily. Omega CO3-SE   Semaglutide, 2 MG/DOSE, (OZEMPIC, 2 MG/DOSE,) 8 MG/3ML SOPN Inject 2 mg into the skin once a week.   tizanidine (ZANAFLEX) 2 MG capsule TAKE 1 CAPSULE(2 MG) BY MOUTH THREE TIMES DAILY AS NEEDED FOR MUSCLE SPASMS   topiramate (TOPAMAX) 50 MG tablet Take 1 tablet (50 mg total) by mouth 2 (two) times daily.   traMADol (ULTRAM) 50 MG tablet Take 1 tablet (50 mg total) by mouth every 6 (six) hours as needed (pain).   triamcinolone ointment (KENALOG) 0.1 % Apply 1  Application topically 2 (two) times daily.   valsartan (DIOVAN) 160 MG tablet TAKE 1 TABLET(160 MG) BY MOUTH DAILY   Vitamin E 180 MG CAPS Take 180 mg by mouth every morning.   doxycycline (MONODOX) 100 MG capsule Take 1 capsule (100 mg total) by mouth 2 (two) times daily. (Patient not taking: Reported on 04/06/2022)   No facility-administered encounter medications on file as of 04/06/2022.    Allergies (verified) Hydralazine, Meperidine hcl, Aliskiren, Aspirin, Juniper berries, Latex, Rofecoxib, Spironolactone, Chocolate, Furosemide, Oxycodone-acetaminophen, and Sulfa antibiotics   History: Past Medical History:  Diagnosis Date   Anemia    Arthritis    Back pain    Edema, lower extremity    Food allergy    GERD (gastroesophageal reflux disease)    Hypertension    Hypertensive crisis    Joint pain    Morbid obesity ([place])    Osteoporosis    Pre-diabetes    Rheumatoid arthritis ([place])    Sciatica neuralgia    Sleep apnea    Past Surgical History:  Procedure Laterality Date   ABDOMINAL HYSTERECTOMY     PARTIAL   CARPAL TUNNEL RELEASE Right    02/2022   CHOLECYSTECTOMY     ORIF ELBOW FRACTURE Right    ORIF WRIST FRACTURE Right    PROTESTIC HIPS     PROTESTIC KNEE     Family History  Problem Relation Age of Onset   Hypertension Mother    Cancer Mother    Heart disease Mother    Eating disorder Mother    Obesity Mother    Kidney disease Father    Heart disease Father    Diabetes Father    High blood pressure Father    High Cholesterol Father    Anxiety disorder Father    Bipolar disorder Father    Schizophrenia Father    Hypertension Sister    Social History   Socioeconomic History   Marital status: Single    Spouse name: Not on file   Number of children: Not on file   Years of education: Not on file   Highest education level: Not on file  Occupational History   Occupation: Retired Medical illustrator Nurseb  Tobacco Use   Smoking status: Never   Smokeless  tobacco: Never  Vaping Use   Vaping Use: Never used  Substance and Sexual Activity   Alcohol use: Not Currently   Drug use: Not Currently   Sexual activity: Not Currently  Other Topics Concern   Not on file  Social History Narrative   Lives alone   Right handed   Drinks no caffeine   Social Determinants of Health   Financial Resource Strain: Low Risk  (04/06/2022)   Overall Financial Resource Strain (CARDIA)    Difficulty of Paying Living Expenses: Not hard at all  Food Insecurity: No Food Insecurity (04/06/2022)   Hunger Vital Sign    Worried About Running Out of Food in the Last Year: Never true    Ran Out of Food in the Last Year: Never true  Transportation Needs: No Transportation Needs (04/06/2022)   PRAPARE - Hydrologist (Medical): No    Lack of Transportation (Non-Medical): No  Physical Activity: Insufficiently Active (04/06/2022)   Exercise Vital Sign    Days of Exercise per Week: 2 days    Minutes of Exercise per Session: 30 min  Stress: No Stress Concern Present (04/06/2022)   [place]    Feeling of Stress : Not at all  Social Connections: Unknown (11/04/2018)   Social Connection and Isolation Panel [NHANES]    Frequency of Communication with Friends and Family: More than three times a week    Frequency of Social Gatherings with Friends and Family: More than three times a week    Attends Religious Services: 1 to 4 times per year    Active Member of [employer] or Organizations: No    Attends Music therapist: Never    Marital Status: Not on file    Tobacco Counseling Counseling given: Not Answered   Clinical Intake:  Pre-visit preparation completed: Yes  Pain : 0-10 Pain Score: 6  Pain Type: Chronic pain Pain Location:  (sciatic) Pain Orientation: Right, Left Pain Descriptors / Indicators: Shooting Pain Onset: More than a month ago Pain Frequency:  Constant     Nutritional Status: BMI > 30  Obese Nutritional Risks: None Diabetes: Yes  How often do you need to have someone help you when you read instructions, pamphlets, or other written materials from your doctor or pharmacy?: 1 - Never  Diabetic? Yes Nutrition Risk Assessment:  Has the patient had any N/V/D within the last 2 months?  No  Does the patient have any non-healing wounds?  No  Has the patient had any unintentional weight loss or weight gain?  No   Diabetes:  Is the patient diabetic?  Yes  If diabetic, was a CBG obtained today?  No  Did the patient bring in their glucometer from home?  No  How often do you monitor your CBG's? daily.   Financial Strains and Diabetes Management:  Are you having any financial strains with the device, your supplies or your medication? No .  Does the patient want to be seen by Chronic Care Management for management of their diabetes?  No  Would the patient like to be referred to a Nutritionist or for Diabetic Management?  No   Diabetic Exams:  Diabetic Eye Exam: Overdue for diabetic eye exam. Pt has been advised about the importance in completing this exam. Patient advised to call and schedule an eye exam. Diabetic Foot Exam: Overdue, Pt has been advised about the importance in completing this exam. Pt is scheduled for diabetic foot exam on next appointment.   Interpreter Needed?: No  Information entered by :: NAllen [name]   Activities of Daily Living    04/06/2022   10:08 AM  In your present state of health, do you have any difficulty performing the following activities:  Hearing? 0  Vision? 0  Difficulty concentrating or making decisions? 0  Walking or climbing stairs? 1  Dressing or bathing? 0  Doing errands, shopping? 0  Preparing Food and eating ? N  Using the Toilet? N  In the past six  months, have you accidently leaked urine? N  Do you have problems with loss of bowel control? N  Managing your Medications? N   Managing your Finances? N  Housekeeping or managing your Housekeeping? N    Patient Care Team: [name], FNP as PCP - General ([place]) Belva Crome, MD as PCP - Cardiology (Cardiology) Rex Kras, Claudette Stapler, RN as Case Manager  Indicate any recent Medical Services you may have received from other than [facility] providers in the past year (date may be approximate).     Assessment:   This is a routine wellness examination for Faizah.  Hearing/Vision screen Vision Screening - Comments:: Regular eye exams, [facility]  Dietary issues and exercise activities discussed: Current Exercise Habits: Home exercise routine, Type of exercise: walking, Time (Minutes): 30, Frequency (Times/Week): 3, Weekly Exercise (Minutes/Week): 90   Goals Addressed             This Visit's Progress    Patient Stated       04/06/2022, increase walking to 45 minutes       Depression Screen    04/06/2022   10:08 AM 01/31/2022   11:23 AM 12/27/2021   11:02 AM 09/28/2021    3:26 PM 06/14/2021    3:26 PM 03/16/2021   11:10 AM 08/25/2020    7:04 AM  PHQ 2/9 Scores  PHQ - 2 Score 0 0 0 3 0 0 2  PHQ- 9 Score    3   9    Fall Risk    04/06/2022   10:07 AM 01/31/2022   11:23 AM 12/27/2021   11:02 AM 09/28/2021    3:26 PM 03/16/2021   11:08 AM  Fall Risk   Falls in the past year? 1 0 0 1 1  Comment tripped    slipped on the floor, hip gave out o her  Number falls in past yr: 0 0 0 1 1  Injury with Fall? 0 0 0 1 1  Comment     dislocated hip  Risk for fall due to : Impaired balance/gait;Impaired mobility;Medication side effect No Fall Risks No Fall Risks  Impaired balance/gait;Impaired mobility;Medication side effect;History of fall(s)  Follow up Falls evaluation completed;Education provided;Falls prevention discussed Falls evaluation completed Falls evaluation completed Falls evaluation completed Falls evaluation completed;Education provided;Falls prevention discussed    FALL RISK PREVENTION  PERTAINING TO THE HOME:  Any stairs in or around the home? Yes  If so, are there any without handrails? No  Home free of loose throw rugs in walkways, pet beds, electrical cords, etc? Yes  Adequate lighting in your home to reduce risk of falls? Yes   ASSISTIVE DEVICES UTILIZED TO PREVENT FALLS:  Life alert? No  Use of a cane, walker or w/c? Yes  Grab bars in the bathroom? Yes  Shower chair or bench in shower? Yes  Elevated toilet seat or a handicapped toilet? Yes   TIMED UP AND GO:  Was the test performed? No .      Cognitive Function:    03/11/2019   11:22 AM 09/26/2018    9:58 AM  MMSE - Mini Mental State Exam  Orientation to time 5 5  Orientation to Place 5 4  Registration 3 3  Attention/ Calculation 5 5  Recall 2 2  Language- name 2 objects 2 2  Language- repeat 1 1  Language- follow 3 step command 3 3  Language- read & follow direction 1 1  Write a sentence 1 1  Copy design 1 1  Total score 29 28        04/06/2022   10:09 AM 03/16/2021   11:12 AM 03/11/2020   10:21 AM 03/11/2019   11:18 AM  6CIT Screen  What Year? 0 points 0 points 0 points 0 points  What month? 0 points 0 points 0 points 0 points  What time? 0 points 0 points 0 points 0 points  Count back from 20 0 points 0 points 0 points 0 points  Months in reverse 0 points 0 points 0 points 0 points  Repeat phrase 0 points 0 points 0 points 6 points  Total Score 0 points 0 points 0 points 6 points    Immunizations Immunization History  Administered Date(s) Administered   Fluad Quad(high Dose 65+) 11/15/2020, 12/27/2021   Influenza, High Dose Seasonal PF 01/06/2019   PFIZER Comirnaty(Gray Top)Covid-19 Tri-Sucrose Vaccine 11/15/2020   PFIZER(Purple Top)SARS-COV-2 Vaccination 06/06/2019, 06/30/2019, 01/21/2020   Pfizer Covid-19 Vaccine Bivalent Booster 50yr & up 04/15/2021   Pneumococcal Conjugate-13 03/11/2019, 11/12/2019   Pneumococcal Polysaccharide-23 01/31/2022   Td 06/30/2021   Zoster  Recombinat (Shingrix) 07/14/2021, 09/19/2021    TDAP status: Up to date  Flu Vaccine status: Up to date  Pneumococcal vaccine status: Up to date  Covid-19 vaccine status: Completed vaccines  Qualifies for Shingles Vaccine? Yes   Zostavax completed Yes   Shingrix Completed?: Yes  Screening Tests Health Maintenance  Topic Date Due   FOOT EXAM  Never done   OPHTHALMOLOGY EXAM  Never done   COVID-19 Vaccine (6 - 2023-24 season) 11/25/2021   Medicare Annual Wellness (AWV)  03/16/2022   Diabetic kidney evaluation - Urine ACR  06/15/2022   HEMOGLOBIN A1C  08/01/2022   Diabetic kidney evaluation - eGFR measurement  02/01/2023   MAMMOGRAM  12/24/2023   Fecal DNA (Cologuard)  03/24/2024   DTaP/Tdap/Td (2 - Tdap) 07/01/2031   Pneumonia Vaccine 69 Years old  Completed   INFLUENZA VACCINE  Completed   DEXA SCAN  Completed   Hepatitis C Screening  Completed   Zoster Vaccines- Shingrix  Completed   HPV VACCINES  Aged Out    Health Maintenance  Health Maintenance Due  Topic Date Due   FOOT EXAM  Never done   OPHTHALMOLOGY EXAM  Never done   COVID-19 Vaccine (6 - 2023-24 season) 11/25/2021   Medicare Annual Wellness (AWV)  03/16/2022    Colorectal cancer screening: Type of screening: Cologuard. Completed 03/24/2021. Repeat every 3 years  Mammogram status: Completed 12/23/2021. Repeat every year  Bone Density status: Completed 09/10/2019.   Lung Cancer Screening: (Low Dose CT Chest recommended if Age 69-80years, 30 pack-year currently smoking OR have quit w/in 15years.) does not qualify.   Lung Cancer Screening Referral: no  Additional Screening:  Hepatitis C Screening: does qualify; Completed 01/06/2019  Vision Screening: Recommended annual ophthalmology exams for early detection of glaucoma and other disorders of the eye. Is the patient up to date with their annual eye exam?  Yes  Who is the provider or what is the name of the office in which the patient attends annual  eye exams? [facility] If pt is not established with a provider, would they like to be referred to a provider to establish care? No .   Dental Screening: Recommended annual dental exams for proper oral hygiene  Community Resource Referral / Chronic Care Management: CRR required this visit?  No   CCM required this visit?  No  Plan:     I have personally reviewed and noted the following in the patient's chart:   Medical and social history Use of alcohol, tobacco or illicit drugs  Current medications and supplements including opioid prescriptions. Patient is not currently taking opioid prescriptions. Functional ability and status Nutritional status Physical activity Advanced directives List of other physicians Hospitalizations, surgeries, and ER visits in previous 12 months Vitals Screenings to include cognitive, depression, and falls Referrals and appointments  In addition, I have reviewed and discussed with patient certain preventive protocols, quality metrics, and best practice recommendations. A written personalized care plan for preventive services as well as general preventive health recommendations were provided to patient.     Kellie Simmering, [name]   7/57/3225   Nurse Notes: none  Due to this being a virtual visit, the after visit summary with patients personalized plan was offered to patient via mail or my-chart.  Patient would like to access on my-chart

## 2022-04-11 ENCOUNTER — Encounter: Payer: Self-pay | Admitting: Bariatrics

## 2022-04-11 ENCOUNTER — Ambulatory Visit (INDEPENDENT_AMBULATORY_CARE_PROVIDER_SITE_OTHER): Payer: Medicare Other | Admitting: Bariatrics

## 2022-04-11 ENCOUNTER — Other Ambulatory Visit (HOSPITAL_BASED_OUTPATIENT_CLINIC_OR_DEPARTMENT_OTHER): Payer: Self-pay

## 2022-04-11 VITALS — BP 156/82 | HR 86 | Temp 98.2°F | Ht 64.0 in | Wt 193.0 lb

## 2022-04-11 DIAGNOSIS — I10 Essential (primary) hypertension: Secondary | ICD-10-CM | POA: Diagnosis not present

## 2022-04-11 DIAGNOSIS — M545 Low back pain, unspecified: Secondary | ICD-10-CM | POA: Diagnosis not present

## 2022-04-11 DIAGNOSIS — Z6833 Body mass index (BMI) 33.0-33.9, adult: Secondary | ICD-10-CM

## 2022-04-11 DIAGNOSIS — E1169 Type 2 diabetes mellitus with other specified complication: Secondary | ICD-10-CM | POA: Diagnosis not present

## 2022-04-11 DIAGNOSIS — E669 Obesity, unspecified: Secondary | ICD-10-CM | POA: Diagnosis not present

## 2022-04-11 DIAGNOSIS — Z7985 Long-term (current) use of injectable non-insulin antidiabetic drugs: Secondary | ICD-10-CM

## 2022-04-11 MED ORDER — OZEMPIC (2 MG/DOSE) 8 MG/3ML ~~LOC~~ SOPN
2.0000 mg | PEN_INJECTOR | SUBCUTANEOUS | 0 refills | Status: DC
  Filled 2022-04-11 – 2022-05-02 (×2): qty 3, 28d supply, fill #0

## 2022-04-19 ENCOUNTER — Encounter: Payer: Self-pay | Admitting: Bariatrics

## 2022-04-19 NOTE — Progress Notes (Signed)
Chief Complaint:   OBESITY Brandi [name] is here to discuss her progress with her obesity treatment plan along with follow-up of her obesity related diagnoses. Brandi [name] is on the Category 1 Plan and states she is following her eating plan approximately 100% of the time. Brandi [name] states she is doing 0 minutes 0 times per week.  Today's visit was #: 21 Starting weight: 273 lbs Starting date: 08/25/2020 Today's weight: 193 lbs Today's date: 04/11/2022 Total lbs lost to date: 80 Total lbs lost since last in-office visit: 11  Interim History: Brandi [name] is down 11 pounds since her last visit and she is doing very well.  She is doing well with her water, vegetables, and protein intake.  Subjective:   1. Diabetes mellitus type 2 in obese (HCC) Brandi [name]'s fasting blood sugars range between 80-90's.  She is tolerating Ozempic well.  2. Essential hypertension Brandi [name]'s blood pressure is controlled.  Assessment/Plan:   1. Diabetes mellitus type 2 in obese Brandi [name]) Brandi [name] will continue Ozempic, and we will refill for 1 month.  - Semaglutide, 2 MG/DOSE, (OZEMPIC, 2 MG/DOSE,) 8 MG/3ML SOPN; Inject 2 mg into the skin once a week.  Dispense: 3 mL; Refill: 0  2. Essential hypertension Brandi [name] will continue her medications, and she will work on eliminating added salt.  3. Obesity, Current BMI 33.3 Brandi [name] is currently in the action stage of change. As such, her goal is to continue with weight loss efforts. She has agreed to the Category 1 Plan.   Exercise goals: Continue physical therapy.   Behavioral modification strategies: increasing lean protein intake, decreasing simple carbohydrates, increasing vegetables, increasing water intake, decreasing eating out, no skipping meals, meal planning and cooking strategies, keeping healthy foods in the home, and planning for success.  Brandi [name] has agreed to follow-up with our clinic in 4 weeks. She was informed of the importance of frequent follow-up visits to maximize her success  with intensive lifestyle modifications for her multiple health conditions.   Objective:   Blood pressure (!) 156/82, pulse 86, temperature 98.2 F (36.8 C), height '5\' 4"'$  (1.626 m), weight 193 lb (87.5 kg), SpO2 98 %. Body mass index is 33.13 kg/m.  General: Cooperative, alert, well developed, in no acute distress. HEENT: Conjunctivae and lids unremarkable. Cardiovascular: Regular rhythm.  Lungs: Normal work of breathing. Neurologic: No focal deficits.   Lab Results  Component Value Date   CREATININE 0.99 01/31/2022   BUN 20 01/31/2022   NA 140 01/31/2022   K 4.3 01/31/2022   CL 104 01/31/2022   CO2 23 01/31/2022   Lab Results  Component Value Date   ALT 14 06/14/2021   AST 15 06/14/2021   ALKPHOS 166 (H) 06/14/2021   BILITOT 0.3 06/14/2021   Lab Results  Component Value Date   HGBA1C 5.3 01/31/2022   HGBA1C 5.3 09/28/2021   HGBA1C 5.5 06/14/2021   HGBA1C 5.8 (H) 08/25/2020   HGBA1C 6.0 (H) 06/30/2020   Lab Results  Component Value Date   INSULIN 19.6 08/25/2020   Lab Results  Component Value Date   TSH 1.880 08/25/2020   Lab Results  Component Value Date   CHOL 111 09/28/2021   HDL 46 09/28/2021   LDLCALC 49 09/28/2021   TRIG 79 09/28/2021   CHOLHDL 2.4 09/28/2021   Lab Results  Component Value Date   VD25OH 48.4 06/14/2021   VD25OH 105.0 (H) 08/25/2020   Lab Results  Component Value Date   WBC 7.5 06/14/2021   HGB 12.6 06/14/2021  HCT 36.5 06/14/2021   MCV 91 06/14/2021   PLT 269 06/14/2021   No results found for: "IRON", "TIBC", "FERRITIN"  Attestation Statements:   Reviewed by clinician on day of visit: allergies, medications, problem list, medical history, surgical history, family history, social history, and previous encounter notes.   Wilhemena Durie, am acting as Location manager for [employer], DO.  I have reviewed the above documentation for accuracy and completeness, and I agree with the above. [name], DO

## 2022-04-21 DIAGNOSIS — M48062 Spinal stenosis, lumbar region with neurogenic claudication: Secondary | ICD-10-CM | POA: Diagnosis not present

## 2022-05-02 ENCOUNTER — Other Ambulatory Visit (HOSPITAL_BASED_OUTPATIENT_CLINIC_OR_DEPARTMENT_OTHER): Payer: Self-pay

## 2022-05-05 ENCOUNTER — Ambulatory Visit: Payer: Self-pay

## 2022-05-05 NOTE — Patient Outreach (Signed)
  Care Coordination   05/05/2022 Name: Brandi [name] MRN: [id number] DOB: 08/14/1955   Care Coordination Outreach Attempts:  An unsuccessful telephone outreach was attempted for a scheduled appointment today.  Follow Up Plan:  Additional outreach attempts will be made to offer the patient care coordination information and services.   Encounter Outcome:  No Answer   Care Coordination Interventions:  No, not indicated    Barb Merino, RN, BSN, CCM Care Management Coordinator [facility] Care Management  Direct Phone: [phone]

## 2022-05-09 ENCOUNTER — Ambulatory Visit: Payer: Medicare Other | Admitting: Bariatrics

## 2022-05-17 ENCOUNTER — Encounter: Payer: Self-pay | Admitting: Nurse Practitioner

## 2022-05-17 ENCOUNTER — Ambulatory Visit (INDEPENDENT_AMBULATORY_CARE_PROVIDER_SITE_OTHER): Payer: Medicare Other | Admitting: Nurse Practitioner

## 2022-05-17 ENCOUNTER — Other Ambulatory Visit (HOSPITAL_BASED_OUTPATIENT_CLINIC_OR_DEPARTMENT_OTHER): Payer: Self-pay

## 2022-05-17 ENCOUNTER — Telehealth: Payer: Self-pay

## 2022-05-17 VITALS — BP 163/99 | HR 67 | Temp 98.2°F | Ht 64.0 in | Wt 193.0 lb

## 2022-05-17 DIAGNOSIS — E1169 Type 2 diabetes mellitus with other specified complication: Secondary | ICD-10-CM

## 2022-05-17 DIAGNOSIS — E669 Obesity, unspecified: Secondary | ICD-10-CM | POA: Diagnosis not present

## 2022-05-17 DIAGNOSIS — Z7985 Long-term (current) use of injectable non-insulin antidiabetic drugs: Secondary | ICD-10-CM | POA: Diagnosis not present

## 2022-05-17 DIAGNOSIS — Z6833 Body mass index (BMI) 33.0-33.9, adult: Secondary | ICD-10-CM

## 2022-05-17 MED ORDER — OZEMPIC (2 MG/DOSE) 8 MG/3ML ~~LOC~~ SOPN
2.0000 mg | PEN_INJECTOR | SUBCUTANEOUS | 0 refills | Status: DC
  Filled 2022-05-17 – 2022-05-31 (×2): qty 3, 28d supply, fill #0

## 2022-05-17 NOTE — Progress Notes (Signed)
Office: [phone]  /  Fax: [phone]  WEIGHT SUMMARY AND BIOMETRICS  Weight Lost Since Last Visit: 0lb   Medical Weight Loss Height: 5' 4"$  (1.626 m) Weight: 193 lb (87.5 kg) Temp: 98.2 F (36.8 C) Pulse Rate: 67 BP: (!) 163/99 SpO2: 100 % Fasting: No Today's Visit #: 22 Weight at Last VIsit: 193lb Weight Lost Since Last Visit: 0lb  Body Fat %: 49.4 % Fat Mass (lbs): 95.4 lbs Muscle Mass (lbs): 92.8 lbs Visceral Fat Rating : 14 Starting Date: 08/25/20 Starting Weight: 273lb Total Weight Loss (lbs): 80 lb (36.3 kg)     HPI  Chief Complaint: OBESITY  Brandi [name] is here to discuss her progress with her obesity treatment plan. She is on the the Category 1 Plan and states she is following her eating plan approximately 60 % of the time. She states she is exercising 15 minutes 3 days per week.   Interval History:  Since last office visit she has maintained her weight.  She has had multiple deaths in her family. She has gotten off track.  Has been eating more sweets. Has been stress eating some.  Plans to get back on track with the meal plan and start back exercising. She is drinking water, green tea and juice (cranberry, apple, clear).  Her highest was 324lbs.    She is looking forward to a conference in April in [place].    Pharmacotherapy for weight loss: She is currently not taking medications for medical weight loss.     Previous pharmacotherapy for medical weight loss:  None    Bariatric surgery:  Never had weight loss surgery  Pharmacotherapy for DMT2:  she is currently taking Ozempic 82m.  Denies side effects.   Last A1c was 5.3 CBGs: Fasting 90-104   Episodes of hypoglycemia: no On ARB and statin.  Last eye exam:  2023  Lab Results  Component Value Date   HGBA1C 5.3 01/31/2022   HGBA1C 5.3 09/28/2021   HGBA1C 5.5 06/14/2021   Lab Results  Component Value Date   MICROALBUR 10 03/11/2020   LDLCALC 49 09/28/2021   CREATININE 0.99 01/31/2022    PHYSICAL  EXAM:  Blood pressure (!) 163/99, pulse 67, temperature 98.2 F (36.8 C), height 5' 4"$  (1.626 m), weight 193 lb (87.5 kg), SpO2 100 %. Body mass index is 33.13 kg/m.  General: She is overweight, cooperative, alert, well developed, and in no acute distress. PSYCH: Has normal mood, affect and thought process.   Extremities: No edema.  Neurologic: No gross sensory or motor deficits. No tremors or fasciculations noted.    DIAGNOSTIC DATA REVIEWED:  BMET    Component Value Date/Time   NA 140 01/31/2022 1255   K 4.3 01/31/2022 1255   CL 104 01/31/2022 1255   CO2 23 01/31/2022 1255   GLUCOSE 84 01/31/2022 1255   GLUCOSE 107 (H) 02/21/2021 1100   BUN 20 01/31/2022 1255   CREATININE 0.99 01/31/2022 1255   CALCIUM 9.7 01/31/2022 1255   GFRNONAA 45 (L) 02/21/2021 1100   GFRAA 77 03/23/2020 1202   Lab Results  Component Value Date   HGBA1C 5.3 01/31/2022   HGBA1C 6.5 (H) 09/30/2018   Lab Results  Component Value Date   INSULIN 19.6 08/25/2020   Lab Results  Component Value Date   TSH 1.880 08/25/2020   CBC    Component Value Date/Time   WBC 7.5 06/14/2021 1553   WBC 8.7 02/21/2021 1100   RBC 4.02 06/14/2021 1553   RBC 4.15  02/21/2021 1100   HGB 12.6 06/14/2021 1553   HCT 36.5 06/14/2021 1553   PLT 269 06/14/2021 1553   MCV 91 06/14/2021 1553   MCH 31.3 06/14/2021 1553   MCH 31.3 02/21/2021 1100   MCHC 34.5 06/14/2021 1553   MCHC 33.5 02/21/2021 1100   RDW 12.4 06/14/2021 1553   Iron Studies No results found for: "IRON", "TIBC", "FERRITIN", "IRONPCTSAT" Lipid Panel     Component Value Date/Time   CHOL 111 09/28/2021 1644   TRIG 79 09/28/2021 1644   HDL 46 09/28/2021 1644   CHOLHDL 2.4 09/28/2021 1644   LDLCALC 49 09/28/2021 1644   Hepatic Function Panel     Component Value Date/Time   PROT 7.3 06/14/2021 1553   ALBUMIN 4.6 06/14/2021 1553   AST 15 06/14/2021 1553   ALT 14 06/14/2021 1553   ALKPHOS 166 (H) 06/14/2021 1553   BILITOT 0.3 06/14/2021 1553       Component Value Date/Time   TSH 1.880 08/25/2020 1143   Nutritional Lab Results  Component Value Date   VD25OH 48.4 06/14/2021   VD25OH 105.0 (H) 08/25/2020     ASSESSMENT AND PLAN  TREATMENT PLAN FOR OBESITY:  Recommended Dietary Goals  Brandi [name] is currently in the action stage of change. As such, her goal is to continue weight management plan. She has agreed to the Category 1 Plan.  Behavioral Intervention  We discussed the following Behavioral Modification Strategies today: increasing lean protein intake, decreasing simple carbohydrates , increasing vegetables, avoid skipping meals, increase water intake, work on meal planning and easy cooking plans, and think about ways to increase physical activity.  Additional resources provided today: NA  Recommended Physical Activity Goals  Brandi [name] has been advised to work up to 150 minutes of moderate intensity aerobic activity a week and strengthening exercises 2-3 times per week for cardiovascular health, weight loss maintenance and preservation of muscle mass.   She has agreed to increase physical activity in their day and reduce sedentary time (increase NEAT).  and Patient also encouraged on scheduling and tracking physical activity.    ASSOCIATED CONDITIONS ADDRESSED TODAY  Action/Plan  Diabetes mellitus type 2 in obese (HCC) -     Ozempic (2 MG/DOSE); Inject 2 mg into the skin once a week.  Dispense: 3 mL; Refill: 0  Good blood sugar control is important to decrease the likelihood of diabetic complications such as nephropathy, neuropathy, limb loss, blindness, coronary artery disease, and death. Intensive lifestyle modification including diet, exercise and weight loss are the first line of treatment for diabetes.    Generalized obesity  BMI 33.0-33.9,adult      She is seeing her PCP in March for follow up and labs in March.    Return in about 4 weeks (around 06/14/2022).Marland Kitchen She was informed of the importance of  frequent follow up visits to maximize her success with intensive lifestyle modifications for her multiple health conditions.   ATTESTASTION STATEMENTS:  Reviewed by clinician on day of visit: allergies, medications, problem list, medical history, surgical history, family history, social history, and previous encounter notes.    Ailene Rud. Leslieann Whisman FNP-C

## 2022-05-17 NOTE — Progress Notes (Deleted)
Office: [phone]  /  Fax: [phone]  [place]  No data recorded  HPI  Chief Complaint: OBESITY  Brandi [name] is here to discuss her progress with her obesity treatment plan. She is on the {MWMwtlossportion/plan2:23431} and states she is following her eating plan approximately *** % of the time. She states she is exercising *** minutes *** days per week.   Interval History:  Since last office visit she ***   Pharmacotherapy for weight loss: She *** currently taking *** for medical weight loss.  Denies side effects.    Previous pharmacotherapy for medical weight loss:  {srtpreviousweightlossmeds (Optional):29124}  She stopped *** due to side effects of ***.  Bariatric surgery:  Patient is status post {srtweightlosssurgery:29125} by Dr.  Marland Kitchen in ***.  Her highest weight prior to surgery was *** and her nadir weight after surgery was ***.  She is taking {srtvitamins (Optional):29126}.  She reports {srtrestriction (Optional):29127} restriction.     PHYSICAL EXAM:  There were no vitals taken for this visit. There is no height or weight on file to calculate BMI.  General: She is overweight, cooperative, alert, well developed, and in no acute distress. PSYCH: Has normal mood, affect and thought process.   Extremities: No edema.  Neurologic: No gross sensory or motor deficits. No tremors or fasciculations noted.    DIAGNOSTIC DATA REVIEWED:  BMET    Component Value Date/Time   NA 140 01/31/2022 1255   K 4.3 01/31/2022 1255   CL 104 01/31/2022 1255   CO2 23 01/31/2022 1255   GLUCOSE 84 01/31/2022 1255   GLUCOSE 107 (H) 02/21/2021 1100   BUN 20 01/31/2022 1255   CREATININE 0.99 01/31/2022 1255   CALCIUM 9.7 01/31/2022 1255   GFRNONAA 45 (L) 02/21/2021 1100   GFRAA 77 03/23/2020 1202   Lab Results  Component Value Date   HGBA1C 5.3 01/31/2022   HGBA1C 6.5 (H) 09/30/2018   Lab Results  Component Value Date   INSULIN 19.6 08/25/2020   Lab Results   Component Value Date   TSH 1.880 08/25/2020   CBC    Component Value Date/Time   WBC 7.5 06/14/2021 1553   WBC 8.7 02/21/2021 1100   RBC 4.02 06/14/2021 1553   RBC 4.15 02/21/2021 1100   HGB 12.6 06/14/2021 1553   HCT 36.5 06/14/2021 1553   PLT 269 06/14/2021 1553   MCV 91 06/14/2021 1553   MCH 31.3 06/14/2021 1553   MCH 31.3 02/21/2021 1100   MCHC 34.5 06/14/2021 1553   MCHC 33.5 02/21/2021 1100   RDW 12.4 06/14/2021 1553   Iron Studies No results found for: "IRON", "TIBC", "FERRITIN", "IRONPCTSAT" Lipid Panel     Component Value Date/Time   CHOL 111 09/28/2021 1644   TRIG 79 09/28/2021 1644   HDL 46 09/28/2021 1644   CHOLHDL 2.4 09/28/2021 1644   LDLCALC 49 09/28/2021 1644   Hepatic Function Panel     Component Value Date/Time   PROT 7.3 06/14/2021 1553   ALBUMIN 4.6 06/14/2021 1553   AST 15 06/14/2021 1553   ALT 14 06/14/2021 1553   ALKPHOS 166 (H) 06/14/2021 1553   BILITOT 0.3 06/14/2021 1553      Component Value Date/Time   TSH 1.880 08/25/2020 1143   Nutritional Lab Results  Component Value Date   VD25OH 48.4 06/14/2021   VD25OH 105.0 (H) 08/25/2020     ASSESSMENT AND PLAN  TREATMENT PLAN FOR OBESITY:  Recommended Dietary Goals  Tuleen is currently in the action  stage of change. As such, her goal is to continue weight management plan. She has agreed to {MWMwtlossportion/plan2:23431}.  Behavioral Intervention  We discussed the following Behavioral Modification Strategies today: {EMWMwtlossstrategies:28914::"increasing lean protein intake","increasing vegetables","increase water intake","work on meal planning and easy cooking plans","think about ways to increase physical activity"}.  Additional resources provided today: NA  Recommended Physical Activity Goals  Louren has been advised to work up to 150 minutes of moderate intensity aerobic activity a week and strengthening exercises 2-3 times per week for cardiovascular health, weight loss  maintenance and preservation of muscle mass.   She has agreed to {EMEXERCISE:28847::"increase physical activity in their day and reduce sedentary time (increase NEAT). "}   Pharmacotherapy We discussed various medication options to help Dekiya with her weight loss efforts and we both agreed to ***.  ASSOCIATED CONDITIONS ADDRESSED TODAY  Action/Plan  Diabetes mellitus type 2 in obese [facility])  Morbid obesity ([place])  Other hyperlipidemia         No follow-ups on file.Marland Kitchen She was informed of the importance of frequent follow up visits to maximize her success with intensive lifestyle modifications for her multiple health conditions.   ATTESTASTION STATEMENTS:  Reviewed by clinician on day of visit: allergies, medications, problem list, medical history, surgical history, family history, social history, and previous encounter notes.   Time spent on visit including pre-visit chart review and post-visit care and charting was *** minutes.    Ailene Rud. Onia Shiflett FNP-C

## 2022-05-17 NOTE — Telephone Encounter (Signed)
**Note De-Identified  Obfuscation** Brandi [name] ([id number]) Outcome [facility] has indicated that it is too soon to refill this medication at the pharmacy for your patient.  Drug Nebivolol HCl 20MG tablets ePA cloud Child psychotherapist Electronic PA Form [phone] NCPDP)

## 2022-05-24 DIAGNOSIS — M48062 Spinal stenosis, lumbar region with neurogenic claudication: Secondary | ICD-10-CM | POA: Diagnosis not present

## 2022-05-26 ENCOUNTER — Telehealth: Payer: Self-pay | Admitting: *Deleted

## 2022-05-26 NOTE — Progress Notes (Signed)
  Care Coordination Note  05/26/2022 Name: [name] MRN: [id number] DOB: October 26, 1955  Brandi [name] is a 67 y.o. year old female who is a primary care patient of [name], [place] and is actively engaged with the care management team. I reached out to [name] by phone today to assist with re-scheduling a follow up visit with the RN Case Manager  Follow up plan: Unsuccessful telephone outreach attempt made. A HIPAA compliant phone message was left for the patient providing contact information and requesting a return call.   [place]  Direct Dial: [phone]

## 2022-05-31 ENCOUNTER — Other Ambulatory Visit (HOSPITAL_BASED_OUTPATIENT_CLINIC_OR_DEPARTMENT_OTHER): Payer: Self-pay

## 2022-05-31 NOTE — Progress Notes (Signed)
  Care Coordination Note  05/31/2022 Name: Brandi [name] MRN: [id number] DOB: [birth year]  Brandi [name] is a 67 y.o. year old female who is a primary care patient of [name], [place] and is actively engaged with the care management team. I reached out to [name] by phone today to assist with re-scheduling a follow up visit with the RN Case Manager  Follow up plan: Telephone appointment with care management team member scheduled for:06/16/22  [place]: [phone]

## 2022-06-01 ENCOUNTER — Other Ambulatory Visit: Payer: Self-pay | Admitting: Nurse Practitioner

## 2022-06-01 ENCOUNTER — Other Ambulatory Visit: Payer: Self-pay | Admitting: *Deleted

## 2022-06-01 DIAGNOSIS — I10 Essential (primary) hypertension: Secondary | ICD-10-CM

## 2022-06-15 ENCOUNTER — Encounter: Payer: Self-pay | Admitting: Pharmacist

## 2022-06-15 DIAGNOSIS — R200 Anesthesia of skin: Secondary | ICD-10-CM | POA: Diagnosis not present

## 2022-06-16 ENCOUNTER — Ambulatory Visit: Payer: Self-pay

## 2022-06-16 NOTE — Patient Instructions (Addendum)
Visit Information  Thank you for taking time to visit with me today. Please don't hesitate to contact me if I can be of assistance to you.   Following are the goals we discussed today:   Goals Addressed               This Visit's Progress     Patient Stated     I am having more sciatica pain (pt-stated)        Care Coordination Interventions: Reviewed provider established plan for pain management Determined patient continues to follow Orthopedics for treatment management of Sciatica pain Discussed with patient she received a recent injection and will have subsequent injections as directed with good effectiveness Educated patient about the PREP program, mailed brochure, sent in basket message to PCP requesting PREP referral        Other     To monitor BP at home 3 times weely and record readings        Care Coordination Interventions: Evaluation of current treatment plan related to hypertension self management and patient's adherence to plan as established by provider Reviewed medications with patient and discussed importance of compliance Advised patient, providing education and rationale, to monitor blood pressure daily and record, calling PCP for findings outside established parameters Educated patient on goal BP <130/80 Instructed patient to check BP at home 3 times weekly at different times of the day and record readings Mailed printed educational materials related to BP categories; Consequences of High Blood Pressure; Why Should I Limit Sodium?; How to Accurately Check BP at Home; BP log          Last practice recorded BP readings:  BP Readings from Last 3 Encounters:  05/17/22 (!) 163/99  04/11/22 (!) 156/82  03/14/22 (!) 154/97   Most recent eGFR/CrCl:  Lab Results  Component Value Date   EGFR 62 01/31/2022    No components found for: "CRCL"         Our next appointment is by telephone on 06/30/22 at 11:30 AM  Please call the care guide team at [phone] if  you need to cancel or reschedule your appointment.   If you are experiencing a Mental Health or [place] or need someone to talk to, please call 1-800-273-TALK (toll free, 24 hour hotline) go to [facility] [address] [place] [phone])  Patient verbalizes understanding of instructions and care plan provided today and agrees to view in [place]. Active MyChart status and patient understanding of how to access instructions and care plan via MyChart confirmed with patient.     Barb Merino, RN, BSN, CCM Care Management Coordinator [facility] Care Management  Direct Phone: [phone]

## 2022-06-16 NOTE — Patient Outreach (Addendum)
  Care Coordination   Follow Up Visit Note   06/16/2022 Name: Brandi [name] MRN: [id number] DOB: [birth year]  Brandi [name] is a 67 y.o. year old female who sees Brandi Brine, FNP for primary care. I spoke with  Brandi [name] by phone today.  What matters to the patients health and wellness today?  Patient will monitor her BP 3 times weekly at home and record. She would like to participate in the PREP program.     Goals Addressed               This Visit's Progress     Patient Stated     I am having more sciatica pain (pt-stated)        Care Coordination Interventions: Reviewed provider established plan for pain management Determined patient continues to follow Orthopedics for treatment management of Sciatica pain Discussed with patient she received a recent injection and will have subsequent injections as directed with good effectiveness Educated patient about the PREP program, mailed brochure, sent in basket message to PCP requesting PREP referral        Other     To monitor BP at home 3 times weely and record readings        Care Coordination Interventions: Evaluation of current treatment plan related to hypertension self management and patient's adherence to plan as established by provider Reviewed medications with patient and discussed importance of compliance Advised patient, providing education and rationale, to monitor blood pressure daily and record, calling PCP for findings outside established parameters Educated patient on goal BP <130/80 Instructed patient to check BP at home 3 times weekly at different times of the day and record readings Mailed printed educational materials related to BP categories; Consequences of High Blood Pressure; Why Should I Limit Sodium?; How to Accurately Check BP at Home; BP log  Last practice recorded BP readings:  BP Readings from Last 3 Encounters:  05/17/22 (!) 163/99  04/11/22 (!) 156/82  03/14/22 (!) 154/97   Most recent  eGFR/CrCl:  Lab Results  Component Value Date   EGFR 62 01/31/2022    No components found for: "CRCL"     Interventions Today    Flowsheet Row Most Recent Value  Chronic Disease   Chronic disease during today's visit Hypertension (HTN), Other  [Sciatica pain]  General Interventions   General Interventions Discussed/Reviewed General Interventions Discussed, General Interventions Reviewed, Doctor Visits  Doctor Visits Discussed/Reviewed Doctor Visits Discussed, PCP, Specialist  Exercise Interventions   Exercise Discussed/Reviewed Physical Activity  Physical Activity Discussed/Reviewed Home Exercise Program (HEP), Physical Activity Discussed, Types of exercise  Education Interventions   Education Provided Provided Education  Provided Verbal Education On Medication  Pharmacy Interventions   Pharmacy Dicussed/Reviewed Pharmacy Topics Discussed, Medications and their functions          SDOH assessments and interventions completed:  No     Care Coordination Interventions:  Yes, provided   Follow up plan: Follow up call scheduled for 06/30/22 @11 :30 AM    Encounter Outcome:  Pt. Visit Completed

## 2022-06-19 ENCOUNTER — Other Ambulatory Visit: Payer: Self-pay | Admitting: Nurse Practitioner

## 2022-06-19 ENCOUNTER — Ambulatory Visit: Payer: Medicare Other | Admitting: Bariatrics

## 2022-06-19 ENCOUNTER — Other Ambulatory Visit (HOSPITAL_BASED_OUTPATIENT_CLINIC_OR_DEPARTMENT_OTHER): Payer: Self-pay

## 2022-06-19 ENCOUNTER — Ambulatory Visit (INDEPENDENT_AMBULATORY_CARE_PROVIDER_SITE_OTHER): Payer: Medicare Other | Admitting: Bariatrics

## 2022-06-19 ENCOUNTER — Encounter: Payer: Self-pay | Admitting: Bariatrics

## 2022-06-19 VITALS — BP 163/98 | HR 65 | Temp 98.0°F | Ht 64.0 in | Wt 201.0 lb

## 2022-06-19 DIAGNOSIS — I110 Hypertensive heart disease with heart failure: Secondary | ICD-10-CM

## 2022-06-19 DIAGNOSIS — E669 Obesity, unspecified: Secondary | ICD-10-CM

## 2022-06-19 DIAGNOSIS — E1169 Type 2 diabetes mellitus with other specified complication: Secondary | ICD-10-CM

## 2022-06-19 DIAGNOSIS — I10 Essential (primary) hypertension: Secondary | ICD-10-CM | POA: Diagnosis not present

## 2022-06-19 DIAGNOSIS — Z6834 Body mass index (BMI) 34.0-34.9, adult: Secondary | ICD-10-CM

## 2022-06-19 DIAGNOSIS — R42 Dizziness and giddiness: Secondary | ICD-10-CM

## 2022-06-19 DIAGNOSIS — Z7985 Long-term (current) use of injectable non-insulin antidiabetic drugs: Secondary | ICD-10-CM

## 2022-06-19 HISTORY — DX: Dizziness and giddiness: R42

## 2022-06-19 HISTORY — DX: Body mass index (BMI) 34.0-34.9, adult: Z68.34

## 2022-06-19 MED ORDER — OZEMPIC (2 MG/DOSE) 8 MG/3ML ~~LOC~~ SOPN
2.0000 mg | PEN_INJECTOR | SUBCUTANEOUS | 0 refills | Status: DC
  Filled 2022-06-19 – 2022-06-27 (×2): qty 3, 28d supply, fill #0

## 2022-06-20 ENCOUNTER — Ambulatory Visit (INDEPENDENT_AMBULATORY_CARE_PROVIDER_SITE_OTHER): Payer: Medicare Other | Admitting: Nurse Practitioner

## 2022-06-20 ENCOUNTER — Encounter: Payer: Self-pay | Admitting: Nurse Practitioner

## 2022-06-20 VITALS — BP 128/82 | HR 71 | Temp 98.4°F | Ht 64.0 in | Wt 203.0 lb

## 2022-06-20 DIAGNOSIS — E559 Vitamin D deficiency, unspecified: Secondary | ICD-10-CM | POA: Diagnosis not present

## 2022-06-20 DIAGNOSIS — E669 Obesity, unspecified: Secondary | ICD-10-CM

## 2022-06-20 DIAGNOSIS — E1169 Type 2 diabetes mellitus with other specified complication: Secondary | ICD-10-CM

## 2022-06-20 DIAGNOSIS — E6609 Other obesity due to excess calories: Secondary | ICD-10-CM

## 2022-06-20 DIAGNOSIS — E782 Mixed hyperlipidemia: Secondary | ICD-10-CM

## 2022-06-20 DIAGNOSIS — Z0000 Encounter for general adult medical examination without abnormal findings: Secondary | ICD-10-CM

## 2022-06-20 DIAGNOSIS — G4733 Obstructive sleep apnea (adult) (pediatric): Secondary | ICD-10-CM | POA: Insufficient documentation

## 2022-06-20 DIAGNOSIS — I110 Hypertensive heart disease with heart failure: Secondary | ICD-10-CM | POA: Diagnosis not present

## 2022-06-20 DIAGNOSIS — E66811 Obesity, class 1: Secondary | ICD-10-CM

## 2022-06-20 DIAGNOSIS — I5032 Chronic diastolic (congestive) heart failure: Secondary | ICD-10-CM | POA: Diagnosis not present

## 2022-06-20 DIAGNOSIS — Z0001 Encounter for general adult medical examination with abnormal findings: Secondary | ICD-10-CM

## 2022-06-20 DIAGNOSIS — Z9989 Dependence on other enabling machines and devices: Secondary | ICD-10-CM

## 2022-06-20 DIAGNOSIS — Z23 Encounter for immunization: Secondary | ICD-10-CM | POA: Diagnosis not present

## 2022-06-20 DIAGNOSIS — Z6834 Body mass index (BMI) 34.0-34.9, adult: Secondary | ICD-10-CM

## 2022-06-20 NOTE — Progress Notes (Signed)
I,Sheena H Holbrook,acting as a Education administrator for [name], FNP.,have documented all relevant documentation on the behalf of [name], FNP,as directed by  [name], FNP while in the presence of [name], [place].   Subjective:     Patient ID: Brandi [name] , female    DOB: 08-07-1955 , 67 y.o.   MRN: [id number]   Chief Complaint  Patient presents with   Annual Exam    HPI  Patient presents today for annual exam.  She has seen orthopedics who is working on her carpal tunnel and spasms in her leg. Dr Jacelyn Grip is working on her sciatic nerve, she can walk better.   Wt Readings from Last 3 Encounters: 06/20/22 : 203 lb (92.1 kg) 06/19/22 : 201 lb (91.2 kg) 05/17/22 : 193 lb (87.5 kg)    Diabetes She presents for her follow-up diabetic visit. She has type 2 diabetes mellitus. Her disease course has been stable. Pertinent negatives for hypoglycemia include no dizziness or headaches. Pertinent negatives for diabetes include no blurred vision, no chest pain, no fatigue, no polydipsia, no polyphagia and no polyuria. Symptoms are stable. There are no diabetic complications. Risk factors for coronary artery disease include dyslipidemia, obesity and sedentary lifestyle. Current diabetic treatment includes diet. She is compliant with treatment all of the time. Home blood sugar record trend: fluctuating. (Blood sugar ranges 70-124, this morning was 97)  Hypertension This is a chronic problem. The current episode started more than 1 year ago. The problem has been gradually improving since onset. The problem is uncontrolled. Pertinent negatives include no anxiety, blurred vision, chest pain, headaches or palpitations. Risk factors for coronary artery disease include obesity, dyslipidemia and sedentary lifestyle. Past treatments include calcium channel blockers. There are no compliance problems.  There is no history of angina. There is no history of chronic renal disease.     Past Medical History:   Diagnosis Date   Anemia    Arthritis    Back pain    Edema, lower extremity    Food allergy    GERD (gastroesophageal reflux disease)    Hypertension    Hypertensive crisis    Joint pain    Morbid obesity ([place])    Osteoporosis    Pre-diabetes    Rheumatoid arthritis ([place])    Sciatica neuralgia    Sleep apnea      Family History  Problem Relation Age of Onset   Hypertension Mother    Cancer Mother    Heart disease Mother    Eating disorder Mother    Obesity Mother    Kidney disease Father    Heart disease Father    Diabetes Father    High blood pressure Father    High Cholesterol Father    Anxiety disorder Father    Bipolar disorder Father    Schizophrenia Father    Hypertension Sister      Current Outpatient Medications:    amLODipine (NORVASC) 5 MG tablet, Take 1.5 tablets (7.5 mg total) by mouth daily. (Patient taking differently: Take 5 mg by mouth daily. Patient is taking 1 tablet at bedtime daily.), Disp: 135 tablet, Rfl: 3   atorvastatin (LIPITOR) 20 MG tablet, Take 1 tablet (20 mg total) by mouth daily., Disp: 90 tablet, Rfl: 3   Biotin 1000 MCG tablet, Take 1,000 mcg by mouth every morning., Disp: , Rfl:    cetirizine (ZYRTEC) 10 MG tablet, Take 10 mg by mouth every morning., Disp: , Rfl:    Cyanocobalamin (VITAMIN  B-12 PO), Take 1 drop by mouth daily., Disp: , Rfl:    fluticasone (FLONASE) 50 MCG/ACT nasal spray, 1 spray daily as needed for allergies or rhinitis., Disp: , Rfl:    Magnesium 500 MG TABS, Take 500 mg by mouth at bedtime., Disp: , Rfl:    meloxicam (MOBIC) 7.5 MG tablet, Take 7.5 mg by mouth daily., Disp: , Rfl:    mupirocin ointment (BACTROBAN) 2 %, Apply 1 Application topically daily., Disp: 22 g, Rfl: 0   Nebivolol HCl (BYSTOLIC) 20 MG TABS, Take 1 tablet by mouth once daily, Disp: 90 tablet, Rfl: 3   omeprazole (PRILOSEC OTC) 20 MG tablet, Take 20 mg by mouth every morning., Disp: , Rfl:    OVER THE COUNTER MEDICATION, Take 30 mg by mouth 2  (two) times daily. Strengtia 30 mg, Disp: , Rfl:    OVER THE COUNTER MEDICATION, Take 1 tablet by mouth 2 (two) times daily. Hepato - C1, Disp: , Rfl:    OVER THE COUNTER MEDICATION, Take 1 capsule by mouth 2 (two) times daily. Omega CO3-SE, Disp: , Rfl:    Semaglutide, 2 MG/DOSE, (OZEMPIC, 2 MG/DOSE,) 8 MG/3ML SOPN, Inject 2 mg into the skin once a week., Disp: 3 mL, Rfl: 0   tizanidine (ZANAFLEX) 2 MG capsule, TAKE 1 CAPSULE(2 MG) BY MOUTH THREE TIMES DAILY AS NEEDED FOR MUSCLE SPASMS, Disp: 60 capsule, Rfl: 1   topiramate (TOPAMAX) 50 MG tablet, Take 1 tablet (50 mg total) by mouth 2 (two) times daily., Disp: 180 tablet, Rfl: 3   traMADol (ULTRAM) 50 MG tablet, Take 1 tablet (50 mg total) by mouth every 6 (six) hours as needed (pain)., Disp: 20 tablet, Rfl: 0   triamcinolone ointment (KENALOG) 0.1 %, Apply 1 Application topically 2 (two) times daily., Disp: 30 g, Rfl: 2   valsartan (DIOVAN) 160 MG tablet, TAKE 1 TABLET(160 MG) BY MOUTH DAILY, Disp: 90 tablet, Rfl: 3   Vitamin E 180 MG CAPS, Take 180 mg by mouth every morning., Disp: , Rfl:    Allergies  Allergen Reactions   Hydralazine Itching   Meperidine Hcl Rash and Other (See Comments)    hallucinations   Aliskiren Hives   Aspirin Nausea Only   Juniper Berries Hives   Latex Hives   Rofecoxib Diarrhea    Reaction to Vioxx   Spironolactone Other (See Comments)    High doses caused nausea and rash - may can tolerate low doses   Chocolate Nausea And Vomiting and Rash   Furosemide Other (See Comments)    Rash and extreme dehydration   Oxycodone-Acetaminophen Itching and Rash   Sulfa Antibiotics Rash      The patient states she is status post hysterectomy.  No LMP recorded. Patient has had a hysterectomy.. Negative for: breast discharge, breast lump(s), breast pain and breast self exam. Associated symptoms include abnormal vaginal bleeding. Pertinent negatives include abnormal bleeding (hematology), anxiety, decreased libido,  depression, difficulty falling sleep, dyspareunia, history of infertility, nocturia, sexual dysfunction, sleep disturbances, urinary incontinence, urinary urgency, vaginal discharge and vaginal itching. Diet regular - eating a lot of fruit and vegetables and adjusting her carbs, she has been off the last month due to her birthday month. She is working with new cutlery and planning to garden.The patient states her exercise level is minimal but is interested in PREP  The patient's tobacco use is:  Social History   Tobacco Use  Smoking Status Never  Smokeless Tobacco Never   She has been exposed to  passive smoke. The patient's alcohol use is:  Social History   Substance and Sexual Activity  Alcohol Use Not Currently    Review of Systems  Constitutional: Negative.  Negative for fatigue.  HENT: Negative.    Eyes: Negative.  Negative for blurred vision.  Respiratory: Negative.    Cardiovascular: Negative.  Negative for chest pain and palpitations.  Gastrointestinal: Negative.   Endocrine: Negative.  Negative for polydipsia, polyphagia and polyuria.  Genitourinary: Negative.   Musculoskeletal: Negative.   Skin: Negative.   Allergic/Immunologic: Negative.   Neurological: Negative.  Negative for dizziness and headaches.  Hematological: Negative.   Psychiatric/Behavioral: Negative.       Today's Vitals   06/20/22 1115  BP: 128/82  Pulse: 71  Temp: 98.4 F (36.9 C)  TempSrc: Oral  SpO2: 99%  Weight: 203 lb (92.1 kg)  Height: 5\' 4"  (1.626 m)   Body mass index is 34.84 kg/m.   Objective:  Physical Exam Vitals reviewed.  Constitutional:      General: She is not in acute distress.    Appearance: Normal appearance. She is well-developed. She is obese.     Comments: Central obesity  HENT:     Head: Normocephalic and atraumatic.     Right Ear: Hearing, tympanic membrane, ear canal and external ear normal. There is no impacted cerumen.     Left Ear: Hearing, tympanic membrane,  ear canal and external ear normal. There is no impacted cerumen.     Nose: Nose normal.     Mouth/Throat:     Mouth: Mucous membranes are moist.  Eyes:     General: Lids are normal.     Extraocular Movements: Extraocular movements intact.     Conjunctiva/sclera: Conjunctivae normal.     Pupils: Pupils are equal, round, and reactive to light.     Funduscopic exam:    Right eye: No papilledema.        Left eye: No papilledema.  Neck:     Thyroid: No thyroid mass.     Vascular: No carotid bruit.  Cardiovascular:     Rate and Rhythm: Normal rate and regular rhythm.     Pulses: Normal pulses.     Heart sounds: Normal heart sounds. No murmur heard. Pulmonary:     Effort: Pulmonary effort is normal. No respiratory distress.     Breath sounds: Normal breath sounds. No wheezing.  Chest:     Chest wall: No mass.  Breasts:    Tanner Score is 5.     Right: Normal. No mass or tenderness.     Left: Normal. No mass or tenderness.  Abdominal:     General: Abdomen is flat. Bowel sounds are normal. There is no distension.     Palpations: Abdomen is soft.     Tenderness: There is no abdominal tenderness.  Genitourinary:    Rectum: Guaiac result negative.  Musculoskeletal:        General: No swelling or tenderness. Normal range of motion.     Cervical back: Full passive range of motion without pain, normal range of motion and neck supple.     Right lower leg: No edema.     Left lower leg: No edema.  Lymphadenopathy:     Upper Body:     Right upper body: No supraclavicular, axillary or pectoral adenopathy.     Left upper body: No supraclavicular, axillary or pectoral adenopathy.  Skin:    General: Skin is warm and dry.     Capillary Refill:  Capillary refill takes less than 2 seconds.  Neurological:     General: No focal deficit present.     Mental Status: She is alert and oriented to person, place, and time.     Cranial Nerves: No cranial nerve deficit.     Sensory: No sensory deficit.      Motor: No weakness.  Psychiatric:        Mood and Affect: Mood normal.        Behavior: Behavior normal.        Thought Content: Thought content normal.        Judgment: Judgment normal.         Assessment And Plan:     1. Encounter for health maintenance examination Behavior modifications discussed and diet history reviewed.   Pt will continue to exercise regularly and modify diet with low GI, plant based foods and decrease intake of processed foods.  Recommend intake of daily multivitamin, Vitamin D, and calcium.  Recommend mammogram and colonoscopy for preventive screenings, as well as recommend immunizations that include influenza, TDAP, and Shingles  2. Class 1 obesity due to excess calories with serious comorbidity and body mass index (BMI) of 34.0 to 34.9 in adult She is encouraged to strive for BMI less than 30 to decrease cardiac risk. Advised to aim for at least 150 minutes of exercise per week.  3. Need for COVID-19 vaccine Covid 19 vaccine given in office observed for 15 minutes without any adverse reaction - Pfizer Fall 2023 Covid-19 Vaccine 31yrs and older  4. Diabetes mellitus type 2 in obese [facility]) Comments: HgbA1c is improving, continue currnet medications - Hemoglobin A1c - Microalbumin / Creatinine Urine Ratio  5. Hypertensive heart disease with heart failure (HCC) Comments: Blood pressure is well controlled, continue current medications. - CBC - CMP14+EGFR  6. Chronic diastolic heart failure (HCC) Comments: Continue f/u with Cardiology  7. Mixed hyperlipidemia Comments: Cholesterol levels are stable. Continue low fat diet. - Lipid panel  8. Vitamin D deficiency - VITAMIN D 25 Hydroxy (Vit-D Deficiency, Fractures)  9. Obstructive sleep apnea syndrome Comments: Doing well and does not feel this is causing her any problems  10. CPAP (continuous positive airway pressure) dependence Comments: She wears sometimes but does not feel like she needs it  as much.   Patient was given opportunity to ask questions. Patient verbalized understanding of the plan and was able to repeat key elements of the plan. All questions were answered to their satisfaction.   Arnette Felts, FNP   I, Arnette Felts, FNP, have reviewed all documentation for this visit. The documentation on 06/20/22 for the exam, diagnosis, procedures, and orders are all accurate and complete.   THE PATIENT IS ENCOURAGED TO PRACTICE SOCIAL DISTANCING DUE TO THE COVID-19 PANDEMIC.

## 2022-06-20 NOTE — Progress Notes (Unsigned)
Chief Complaint:   OBESITY Brandi [name] is here to discuss her progress with her obesity treatment plan along with follow-up of her obesity related diagnoses. [name] is on the Category 1 Plan and states she is following her eating plan approximately 20% of the time. [name] states she is doing 0 minutes 0 times per week.  Today's visit was #: 23 Starting weight: 273 lbs Starting date: 08/25/2020 Today's weight: 201 lbs Today's date: 06/19/2022 Total lbs lost to date: 72 Total lbs lost since last in-office visit: 0  Interim History: [name] is up 8 pounds since her last visit.  She states that it has been hard lately.   Subjective:   1. Diabetes mellitus type 2 in obese [name]) [name] is taking Ozempic  2. Essential hypertension [name] is taking her medications as directed.  Her blood pressure is slightly elevated today.  Assessment/Plan:   1. Diabetes mellitus type 2 in obese [facility]) Shaleta will continue Ozempic, and we will refill for 1 month.  - Semaglutide, 2 MG/DOSE, (OZEMPIC, 2 MG/DOSE,) 8 MG/3ML SOPN; Inject 2 mg into the skin once a week.  Dispense: 3 mL; Refill: 0  2. Essential hypertension [name] will continue her medications, and she will continue to check her blood pressure at home.  She will be seeing her PCP tomorrow.  3. Generalized obesity  4. BMI 34.0-34.9,adult Brandi [name] is currently in the action stage of change. As such, her goal is to continue with weight loss efforts. She has agreed to the Category 1 Plan.   She will follow the plan 80% or more.  Meal planning was discussed.  She will get back on track.  She will continue to increase her water and protein intake.  Exercise goals: Increase steps, more mobile.  Behavioral modification strategies: increasing lean protein intake, decreasing simple carbohydrates, increasing vegetables, increasing water intake, no skipping meals, meal planning and cooking strategies, and keeping healthy foods in the home.  [name] has agreed to  follow-up with our clinic in 4 weeks. She was informed of the importance of frequent follow-up visits to maximize her success with intensive lifestyle modifications for her multiple health conditions.   Objective:   Blood pressure (!) 163/98, pulse 65, temperature 98 F (36.7 C), height 5\' 4"  (1.626 m), weight 201 lb (91.2 kg), SpO2 100 %. Body mass index is 34.5 kg/m.  General: Cooperative, alert, well developed, in no acute distress. HEENT: Conjunctivae and lids unremarkable. Cardiovascular: Regular rhythm.  Lungs: Normal work of breathing. Neurologic: No focal deficits.   Lab Results  Component Value Date   CREATININE 0.99 01/31/2022   BUN 20 01/31/2022   NA 140 01/31/2022   K 4.3 01/31/2022   CL 104 01/31/2022   CO2 23 01/31/2022   Lab Results  Component Value Date   ALT 14 06/14/2021   AST 15 06/14/2021   ALKPHOS 166 (H) 06/14/2021   BILITOT 0.3 06/14/2021   Lab Results  Component Value Date   HGBA1C 5.3 01/31/2022   HGBA1C 5.3 09/28/2021   HGBA1C 5.5 06/14/2021   HGBA1C 5.8 (H) 08/25/2020   HGBA1C 6.0 (H) 06/30/2020   Lab Results  Component Value Date   INSULIN 19.6 08/25/2020   Lab Results  Component Value Date   TSH 1.880 08/25/2020   Lab Results  Component Value Date   CHOL 111 09/28/2021   HDL 46 09/28/2021   LDLCALC 49 09/28/2021   TRIG 79 09/28/2021   CHOLHDL 2.4 09/28/2021   Lab Results  Component  Value Date   VD25OH 48.4 06/14/2021   VD25OH 105.0 (H) 08/25/2020   Lab Results  Component Value Date   WBC 7.5 06/14/2021   HGB 12.6 06/14/2021   HCT 36.5 06/14/2021   MCV 91 06/14/2021   PLT 269 06/14/2021   No results found for: "IRON", "TIBC", "FERRITIN"  Attestation Statements:   Reviewed by clinician on day of visit: allergies, medications, problem list, medical history, surgical history, family history, social history, and previous encounter notes.   Wilhemena Durie, am acting as Location manager for [employer], DO.  I have  reviewed the above documentation for accuracy and completeness, and I agree with the above. [name], DO

## 2022-06-20 NOTE — Patient Instructions (Addendum)
Goal to exercise 150 minutes per week with at least 2 days of strength training I Encourage you to park further when at the store, take stairs instead of elevators and to walk in place during commercials. Increase water intake to at least one gallon of water daily.

## 2022-06-21 LAB — CMP14+EGFR
ALT: 30 IU/L (ref 0–32)
AST: 27 IU/L (ref 0–40)
Albumin/Globulin Ratio: 1.6 (ref 1.2–2.2)
Albumin: 4.4 g/dL (ref 3.9–4.9)
Alkaline Phosphatase: 150 IU/L — ABNORMAL HIGH (ref 44–121)
BUN/Creatinine Ratio: 22 (ref 12–28)
BUN: 19 mg/dL (ref 8–27)
Bilirubin Total: 0.3 mg/dL (ref 0.0–1.2)
CO2: 21 mmol/L (ref 20–29)
Calcium: 9.7 mg/dL (ref 8.7–10.3)
Chloride: 108 mmol/L — ABNORMAL HIGH (ref 96–106)
Creatinine, Ser: 0.85 mg/dL (ref 0.57–1.00)
Globulin, Total: 2.7 g/dL (ref 1.5–4.5)
Glucose: 85 mg/dL (ref 70–99)
Potassium: 4.1 mmol/L (ref 3.5–5.2)
Sodium: 142 mmol/L (ref 134–144)
Total Protein: 7.1 g/dL (ref 6.0–8.5)
eGFR: 74 mL/min/{1.73_m2} (ref >59)

## 2022-06-21 LAB — CBC
Hematocrit: 36.7 % (ref 34.0–46.6)
Hemoglobin: 12.5 g/dL (ref 11.1–15.9)
MCH: 32.1 pg (ref 26.6–33.0)
MCHC: 34.1 g/dL (ref 31.5–35.7)
MCV: 94 fL (ref 79–97)
Platelets: 216 10*3/uL (ref 150–450)
RBC: 3.9 x10E6/uL (ref 3.77–5.28)
RDW: 13.8 % (ref 11.7–15.4)
WBC: 4.2 10*3/uL (ref 3.4–10.8)

## 2022-06-21 LAB — LIPID PANEL
Chol/HDL Ratio: 2.3 ratio (ref 0.0–4.4)
Cholesterol, Total: 161 mg/dL (ref 100–199)
HDL: 71 mg/dL (ref >39)
LDL Chol Calc (NIH): 74 mg/dL (ref 0–99)
Triglycerides: 87 mg/dL (ref 0–149)
VLDL Cholesterol Cal: 16 mg/dL (ref 5–40)

## 2022-06-21 LAB — MICROALBUMIN / CREATININE URINE RATIO
Creatinine, Urine: 98 mg/dL
Microalb/Creat Ratio: <3 mg/g creat (ref 0–29)
Microalbumin, Urine: <3 ug/mL

## 2022-06-21 LAB — VITAMIN D 25 HYDROXY (VIT D DEFICIENCY, FRACTURES): Vit D, 25-Hydroxy: 42.9 ng/mL (ref 30.0–100.0)

## 2022-06-21 LAB — HEMOGLOBIN A1C
Est. average glucose Bld gHb Est-mCnc: 100 mg/dL
Hgb A1c MFr Bld: 5.1 % (ref 4.8–5.6)

## 2022-06-23 ENCOUNTER — Telehealth: Payer: Self-pay | Admitting: *Deleted

## 2022-06-23 NOTE — Telephone Encounter (Signed)
Contacted regarding PREP Class referral. Left voice message to return call for more information. 

## 2022-06-26 DIAGNOSIS — M48062 Spinal stenosis, lumbar region with neurogenic claudication: Secondary | ICD-10-CM | POA: Diagnosis not present

## 2022-06-26 DIAGNOSIS — R200 Anesthesia of skin: Secondary | ICD-10-CM | POA: Diagnosis not present

## 2022-06-27 ENCOUNTER — Other Ambulatory Visit (HOSPITAL_BASED_OUTPATIENT_CLINIC_OR_DEPARTMENT_OTHER): Payer: Self-pay

## 2022-06-29 ENCOUNTER — Other Ambulatory Visit (HOSPITAL_BASED_OUTPATIENT_CLINIC_OR_DEPARTMENT_OTHER): Payer: Self-pay

## 2022-06-29 ENCOUNTER — Telehealth: Payer: Self-pay | Admitting: *Deleted

## 2022-06-29 NOTE — Progress Notes (Signed)
  Care Coordination Note  06/29/2022 Name: JERINA TAFFE MRN: [id number] DOB: 1955/05/14  XICLALI ERDELY is a 67 y.o. year old female who is a primary care patient of [name], [place] and is actively engaged with the care management team. I reached out to [name] by phone today to assist with re-scheduling a follow up visit with the RN Case Manager  Follow up plan: Unsuccessful telephone outreach attempt made. A HIPAA compliant phone message was left for the patient providing contact information and requesting a return call.   [place]  Direct Dial: [phone]

## 2022-07-03 DIAGNOSIS — G5622 Lesion of ulnar nerve, left upper limb: Secondary | ICD-10-CM | POA: Diagnosis not present

## 2022-07-03 NOTE — Progress Notes (Signed)
  Care Coordination Note  07/03/2022 Name: Brandi [name] MRN: [id number] DOB: Nov 22, 1955  Brandi [name] is a 67 y.o. year old female who is a primary care patient of Arnette Felts, FNP and is actively engaged with the care management team. I reached out to Conley Rolls by phone today to assist with re-scheduling a follow up visit with the RN Case Manager  Follow up plan: Telephone appointment with care management team member scheduled for:08/07/22  [facility] Coordination Care Guide  Direct Dial: [phone]

## 2022-07-04 DIAGNOSIS — G5602 Carpal tunnel syndrome, left upper limb: Secondary | ICD-10-CM | POA: Diagnosis not present

## 2022-07-05 ENCOUNTER — Telehealth: Payer: Self-pay | Admitting: *Deleted

## 2022-07-05 NOTE — Telephone Encounter (Signed)
Returned my call regarding PREP Class referral. She is interested in participating at the [facility]. She did inform me of her upcoming carpel tunnel surgery in May. We will call her back with summer class availability once she is cleared for exercise.

## 2022-07-10 ENCOUNTER — Telehealth: Payer: Self-pay | Admitting: *Deleted

## 2022-07-10 NOTE — Telephone Encounter (Signed)
1st attempt: I left a message for the patient to call our office back to schedule tele visit for pre-op clearance.

## 2022-07-10 NOTE — Telephone Encounter (Signed)
   Pre-operative Risk Assessment    Patient Name: Brandi [name]  DOB: Apr 20, 1955 MRN: [id number]      Request for Surgical Clearance    Procedure:  LEFT ULNAR NEUROPLASTY AT THE ELBOW, ENDOSCOPIC CARPAL TUNNEL RELEASE, & DISTAL NERVE TRANSFER  Date of Surgery:  Clearance 08/23/22                                 Surgeon:  Mack Hook, MD Surgeon's Group or Practice Name:  [facility] Phone number:  [phone] Fax number:  [phone]   Type of Clearance Requested:   - Medical    Type of Anesthesia:   MAC WITH PREO-OP BLOCK   Additional requests/questions:    [name]   07/10/2022, 2:22 PM

## 2022-07-10 NOTE — Telephone Encounter (Signed)
   Name: SIDNEE LOOSE  DOB: 1955/11/27  MRN: [id number]  Primary Cardiologist: Lesleigh Noe, MD (Inactive)  Chart reviewed as part of pre-operative protocol coverage. Because of Tanyah Sula Arpin's past medical history and time since last visit, she will require a follow-up telephone visit in order to better assess preoperative cardiovascular risk.  Pre-op covering staff: - Please schedule appointment and call patient to inform them. If patient already had an upcoming appointment within acceptable timeframe, please add "pre-op clearance" to the appointment notes so provider is aware. - Please contact requesting surgeon's office via preferred method (i.e, phone, fax) to inform them of need for appointment prior to surgery.  No medications indicated as needing held.  Sharlene [name], PA-C  07/10/2022, 3:54 PM

## 2022-07-11 ENCOUNTER — Telehealth: Payer: Self-pay | Admitting: *Deleted

## 2022-07-11 NOTE — Telephone Encounter (Signed)
Pt has been scheduled for tele pre op appt 07/21/22 @ 2 pm. Med rec and consent are done.     Patient Consent for Virtual Visit        Brandi [name] has provided verbal consent on 07/11/2022 for a virtual visit (video or telephone).   CONSENT FOR VIRTUAL VISIT FOR:  Brandi [name]  By participating in this virtual visit I agree to the following:  I hereby voluntarily request, consent and authorize [facility] and its employed or contracted physicians, physician assistants, nurse practitioners or other licensed health care professionals (the Practitioner), to provide me with telemedicine health care services (the "Services") as deemed necessary by the treating Practitioner. I acknowledge and consent to receive the Services by the Practitioner via telemedicine. I understand that the telemedicine visit will involve communicating with the Practitioner through live audiovisual communication technology and the disclosure of certain medical information by electronic transmission. I acknowledge that I have been given the opportunity to request an in-person assessment or other available alternative prior to the telemedicine visit and am voluntarily participating in the telemedicine visit.  I understand that I have the right to withhold or withdraw my consent to the use of telemedicine in the course of my care at any time, without affecting my right to future care or treatment, and that the Practitioner or I may terminate the telemedicine visit at any time. I understand that I have the right to inspect all information obtained and/or recorded in the course of the telemedicine visit and may receive copies of available information for a reasonable fee.  I understand that some of the potential risks of receiving the Services via telemedicine include:  Delay or interruption in medical evaluation due to technological equipment failure or disruption; Information transmitted may not be sufficient (e.g.  poor resolution of images) to allow for appropriate medical decision making by the Practitioner; and/or  In rare instances, security protocols could fail, causing a breach of personal health information.  Furthermore, I acknowledge that it is my responsibility to provide information about my medical history, conditions and care that is complete and accurate to the best of my ability. I acknowledge that Practitioner's advice, recommendations, and/or decision may be based on factors not within their control, such as incomplete or inaccurate data provided by me or distortions of diagnostic images or specimens that may result from electronic transmissions. I understand that the practice of medicine is not an exact science and that Practitioner makes no warranties or guarantees regarding treatment outcomes. I acknowledge that a copy of this consent can be made available to me via my patient portal [facility] MyChart), or I can request a printed copy by calling the office of [facility].    I understand that my insurance will be billed for this visit.   I have read or had this consent read to me. I understand the contents of this consent, which adequately explains the benefits and risks of the Services being provided via telemedicine.  I have been provided ample opportunity to ask questions regarding this consent and the Services and have had my questions answered to my satisfaction. I give my informed consent for the services to be provided through the use of telemedicine in my medical care

## 2022-07-11 NOTE — Telephone Encounter (Signed)
Pt has been scheduled for tele pre op appt 07/21/22 @ 2 pm. Med rec and consent are done.

## 2022-07-21 ENCOUNTER — Ambulatory Visit: Payer: Federal, State, Local not specified - PPO | Attending: Cardiovascular Disease | Admitting: Nurse Practitioner

## 2022-07-21 DIAGNOSIS — Z01810 Encounter for preprocedural cardiovascular examination: Secondary | ICD-10-CM | POA: Diagnosis not present

## 2022-07-21 NOTE — Progress Notes (Signed)
Virtual Visit via Telephone Note   Because of Brandi [name]'s co-morbid illnesses, she is at least at moderate risk for complications without adequate follow up.  This format is felt to be most appropriate for this patient at this time.  The patient did not have access to video technology/had technical difficulties with video requiring transitioning to audio format only (telephone).  All issues noted in this document were discussed and addressed.  No physical exam could be performed with this format.  Please refer to the patient's chart for her consent to telehealth for [facility].  Evaluation Performed:  Preoperative cardiovascular risk assessment _____________   Date:  07/21/2022   Patient ID:  Brandi [name], DOB 1955/07/17, MRN [id number] Patient Location:  Home Provider location:   Office  Primary Care Provider:  Arnette Felts, FNP Primary Cardiologist:  Brandi Noe, MD (Inactive)  Chief Complaint / Patient Profile   67 y.o. y/o female with a h/o severe hypertension, hypertensive heart disease, chronic diastolic heart failure, multiple medication intolerances, significant bilateral lower extremity edema, morbid obesity, anemia, arthritis, GERD, RA, sciatica, and OSA not on CPAP who is pending  LEFT ULNAR NEUROPLASTY AT THE ELBOW, ENDOSCOPIC CARPAL TUNNEL RELEASE, & DISTAL NERVE TRANSFER with Dr. Mack [name] of [facility] and presents today for telephonic preoperative cardiovascular risk assessment.  History of Present Illness    Brandi [name] is a 67 y.o. female who presents via audio/video conferencing for a telehealth visit today.  Pt was last seen in cardiology clinic on 01/06/2022 by Dr. Katrinka [name].  She was seen virtually on 02/28/2022. At that time Brandi [name] was doing well.  The patient is now pending procedure as outlined above. Since her last visit, she has done well from a cardiac standpoint.   She denies chest pain, palpitations, dyspnea,  pnd, orthopnea, n, v, dizziness, syncope, edema, weight gain, or early satiety. All other systems reviewed and are otherwise negative except as noted above.   Past Medical History    Past Medical History:  Diagnosis Date   Anemia    Arthritis    Back pain    Edema, lower extremity    Food allergy    GERD (gastroesophageal reflux disease)    Hypertension    Hypertensive crisis    Joint pain    Morbid obesity (HCC)    Osteoporosis    Pre-diabetes    Rheumatoid arthritis (HCC)    Sciatica neuralgia    Sleep apnea    Past Surgical History:  Procedure Laterality Date   ABDOMINAL HYSTERECTOMY     PARTIAL   CARPAL TUNNEL RELEASE Right    02/2022   CHOLECYSTECTOMY     ORIF ELBOW FRACTURE Right    ORIF WRIST FRACTURE Right    PROTESTIC HIPS     PROTESTIC KNEE      Allergies  Allergies  Allergen Reactions   Hydralazine Itching   Meperidine Hcl Rash and Other (See Comments)    hallucinations   Aliskiren Hives   Aspirin Nausea Only   Juniper Berries Hives   Latex Hives   Rofecoxib Diarrhea    Reaction to Vioxx   Spironolactone Other (See Comments)    High doses caused nausea and rash - may can tolerate low doses   Chocolate Nausea And Vomiting and Rash   Furosemide Other (See Comments)    Rash and extreme dehydration   Oxycodone-Acetaminophen Itching and Rash   Sulfa Antibiotics Rash    Home  Medications    Prior to Admission medications   Medication Sig Start Date End Date Taking? Authorizing Provider  amLODipine (NORVASC) 5 MG tablet Take 1.5 tablets (7.5 mg total) by mouth daily. Patient taking differently: Take 5 mg by mouth daily. Patient is taking 1 tablet at bedtime daily. 03/13/22   Brandi Records, MD  atorvastatin (LIPITOR) 20 MG tablet Take 1 tablet (20 mg total) by mouth daily. 01/06/22   Brandi Records, MD  Biotin 1000 MCG tablet Take 1,000 mcg by mouth every morning.    [provider]  cetirizine (ZYRTEC) 10 MG tablet Take 10 mg by mouth  every morning.    [provider]  Cyanocobalamin (VITAMIN B-12 PO) Take 1 drop by mouth daily. Patient not taking: Reported on 07/11/2022    [provider]  fluticasone (FLONASE) 50 MCG/ACT nasal spray 1 spray daily as needed for allergies or rhinitis.    [provider]  Magnesium 500 MG TABS Take 500 mg by mouth at bedtime.    [provider]  meloxicam (MOBIC) 7.5 MG tablet Take 7.5 mg by mouth daily. 03/01/22   [provider]  mupirocin ointment (BACTROBAN) 2 % Apply 1 Application topically daily. 12/27/21   Brandi Felts, FNP  Nebivolol HCl (BYSTOLIC) 20 MG TABS Take 1 tablet by mouth once daily 01/16/22   Brandi Records, MD  omeprazole (PRILOSEC OTC) 20 MG tablet Take 20 mg by mouth every morning.    [provider]  OVER THE COUNTER MEDICATION Take 30 mg by mouth 2 (two) times daily. Strengtia 30 mg Patient not taking: Reported on 07/11/2022    [provider]  OVER THE COUNTER MEDICATION Take 1 tablet by mouth 2 (two) times daily. Hepato - C1 Patient not taking: Reported on 07/11/2022    [provider]  OVER THE COUNTER MEDICATION Take 1 capsule by mouth 2 (two) times daily. Omega CO3-SE Patient not taking: Reported on 07/11/2022    [provider]  Semaglutide, 2 MG/DOSE, (OZEMPIC, 2 MG/DOSE,) 8 MG/3ML SOPN Inject 2 mg into the skin once a week. 06/19/22   [name] A, DO  tizanidine (ZANAFLEX) 2 MG capsule TAKE 1 CAPSULE(2 MG) BY MOUTH THREE TIMES DAILY AS NEEDED FOR MUSCLE SPASMS 01/13/22   Brandi Felts, FNP  topiramate (TOPAMAX) 50 MG tablet Take 1 tablet (50 mg total) by mouth 2 (two) times daily. 11/30/21   Micki Riley, MD  traMADol (ULTRAM) 50 MG tablet Take 1 tablet (50 mg total) by mouth every 6 (six) hours as needed (pain). 10/28/21 10/28/22  Brandi Felts, FNP  triamcinolone ointment (KENALOG) 0.1 % Apply 1 Application topically 2 (two) times daily. Patient taking differently: Apply 1 Application  topically 2 (two) times daily as needed. 09/28/21   Brandi Felts, FNP  valsartan (DIOVAN) 160 MG tablet TAKE 1 TABLET(160 MG) BY MOUTH DAILY 03/14/22   Brandi Records, MD  Vitamin E 180 MG CAPS Take 180 mg by mouth every morning.    [provider]    Physical Exam    Vital Signs:  Brandi [name] does not have vital signs available for review today.  Given telephonic nature of communication, physical exam is limited. AAOx3. NAD. Normal affect.  Speech and respirations are unlabored.  Accessory Clinical Findings    None  Assessment & Plan    1.  Preoperative Cardiovascular Risk Assessment:  According to the Revised Cardiac Risk Index (RCRI), her Perioperative Risk of Major Cardiac Event  is (%): 0.9. Her Functional Capacity in METs is: 5.81 according to the Duke Activity Status Index (DASI). Therefore, based on ACC/AHA guidelines, patient would be at acceptable risk for the planned procedure without further cardiovascular testing.   The patient was advised that if she develops new symptoms prior to surgery to contact our office to arrange for a follow-up visit, and she verbalized understanding.   A copy of this note will be routed to requesting surgeon.  Time:   Today, I have spent 5 minutes with the patient with telehealth technology discussing medical history, symptoms, and management plan.     Joylene Grapes, NP  07/21/2022, 2:10 PM

## 2022-07-26 ENCOUNTER — Other Ambulatory Visit (HOSPITAL_BASED_OUTPATIENT_CLINIC_OR_DEPARTMENT_OTHER): Payer: Self-pay

## 2022-07-26 ENCOUNTER — Telehealth (INDEPENDENT_AMBULATORY_CARE_PROVIDER_SITE_OTHER): Payer: Medicare Other | Admitting: Family Medicine

## 2022-07-26 ENCOUNTER — Encounter (INDEPENDENT_AMBULATORY_CARE_PROVIDER_SITE_OTHER): Payer: Self-pay | Admitting: Family Medicine

## 2022-07-26 DIAGNOSIS — G4733 Obstructive sleep apnea (adult) (pediatric): Secondary | ICD-10-CM

## 2022-07-26 DIAGNOSIS — E1169 Type 2 diabetes mellitus with other specified complication: Secondary | ICD-10-CM | POA: Diagnosis not present

## 2022-07-26 DIAGNOSIS — Z7985 Long-term (current) use of injectable non-insulin antidiabetic drugs: Secondary | ICD-10-CM

## 2022-07-26 DIAGNOSIS — Z6834 Body mass index (BMI) 34.0-34.9, adult: Secondary | ICD-10-CM | POA: Diagnosis not present

## 2022-07-26 MED ORDER — OZEMPIC (2 MG/DOSE) 8 MG/3ML ~~LOC~~ SOPN
2.0000 mg | PEN_INJECTOR | SUBCUTANEOUS | 0 refills | Status: DC
  Filled 2022-07-26: qty 3, 28d supply, fill #0

## 2022-07-26 NOTE — Progress Notes (Signed)
TeleHealth Visit:  This visit was completed with telemedicine (audio/video) technology. Brandi [name] has verbally consented to this TeleHealth visit. The patient is located at home, the provider is located at home. The participants in this visit include the listed provider and patient. The visit was conducted today via MyChart video.  OBESITY Brandi [name] is here to discuss her progress with her obesity treatment plan along with follow-up of her obesity related diagnoses.   Today's visit was # 24 Starting weight: 273 lbs Starting date: 08/25/2020 Weight at last in office visit: 201 lbs on 06/19/22 Total weight loss: 72 lbs at last in office visit on 06/19/22. Today's reported weight (07/26/22): none reported  Nutrition Plan: the Category 1 plan - 100% adherence.  Current exercise: walking has increased   Interim History:  She is back on her plan 100% and has lost 5 lbs. she had gained 8 pounds her office visit due to being off plan for her birthday celebration.   She is getting in all of the prescribed protein.  Sciatica better controlled with recent injections.  Assessment/Plan:  1. Type 2 Diabetes Mellitus with other specified complication, without long-term current use of insulin HgbA1c is at goal. Last A1c was 5.1 on 06/20/2022. CBGs: Fasting < 100. Episodes of hypoglycemia: no Medication(s): Ozempic 2 mg SQ weekly.  Denies adverse side effects.  Lab Results  Component Value Date   HGBA1C 5.1 06/20/2022   HGBA1C 5.3 01/31/2022   HGBA1C 5.3 09/28/2021   Lab Results  Component Value Date   MICROALBUR 10 03/11/2020   LDLCALC 74 06/20/2022   CREATININE 0.85 06/20/2022   No results found for: "GFR"  Plan: Continue and refill Ozempic 2 mg SQ weekly   2. Obstructive Sleep Apnea Brandi [name] has a diagnosis of sleep apnea. She reports that she is not using a CPAP regularly. She says that her hypertension is well-controlled and she is now on less medication.  Plan: Encouraged her to  use her CPAP. She will talk to her cardiologist about this.   3. Morbid Obesity: Current BMI 34  Brandi [name] is currently in the action stage of change. As such, her goal is to continue with weight loss efforts.  She has agreed to the Category 1 plan.  Exercise goals: 30-40 minutes 3-4 days per week.   Behavioral modification strategies: planning for success.  [name] has agreed to follow-up with our clinic in 4 weeks.  She will call to schedule her appointment in 4 weeks.  No orders of the defined types were placed in this encounter.   Medications Discontinued During This Encounter  Medication Reason   Semaglutide, 2 MG/DOSE, (OZEMPIC, 2 MG/DOSE,) 8 MG/3ML SOPN Reorder     Meds ordered this encounter  Medications   Semaglutide, 2 MG/DOSE, (OZEMPIC, 2 MG/DOSE,) 8 MG/3ML SOPN    Sig: Inject 2 mg into the skin once a week.    Dispense:  3 mL    Refill:  0    Order Specific Question:   Supervising Provider    Answer:   Glennis Brink [[id number]]      Objective:   VITALS: Per patient if applicable, see vitals. GENERAL: Alert and in no acute distress. CARDIOPULMONARY: No increased WOB. Speaking in clear sentences.  PSYCH: Pleasant and cooperative. Speech normal rate and rhythm. Affect is appropriate. Insight and judgement are appropriate. Attention is focused, linear, and appropriate.  NEURO: Oriented as arrived to appointment on time with no prompting.   Attestation Statements:   Reviewed by Facilities manager on  day of visit: allergies, medications, problem list, medical history, surgical history, family history, social history, and previous encounter notes.   This was prepared with the assistance of Engineer, civil (consulting).  Occasional wrong-word or sound-a-like substitutions may have occurred due to the inherent limitations of voice recognition software.

## 2022-08-01 ENCOUNTER — Ambulatory Visit: Payer: Medicare Other | Admitting: Nurse Practitioner

## 2022-08-07 ENCOUNTER — Ambulatory Visit: Payer: Self-pay

## 2022-08-07 NOTE — Patient Outreach (Signed)
  Care Coordination   Follow Up Visit Note   08/07/2022 Name: RUSHDA RITZ MRN: [id number] DOB: [birth year]  CONCHA LEO is a 67 y.o. year old female who sees Arnette Felts, FNP for primary care. I spoke with  Conley Rolls by phone today.  What matters to the patients health and wellness today?  Patient would like to make a full recovery following her left carpal tunnel surgery.     Goals Addressed               This Visit's Progress     Patient Stated     I am having more sciatica pain (pt-stated)        Care Coordination Interventions: Reviewed provider established plan for pain management Determined patient has been contacted by the PREP team, however she will undergo carpal tunnel surgery and will plan to start PREP once she has recovered       Other     To undergo carpal tunnel surgery to my left hand        Care Coordination Interventions: Evaluation of current treatment plan related to upcoming carpal tunnel surgery to left hand and patient's adherence to plan as established by provider Determined patient will undergo left carpal tunnel surgery on 08/03/22 by Dr. Janee Morn Assessed for patient knowledge and understanding of her prescribed treatment plan, including what to expect before and after surgery Determined patient is able to verbalize understanding of her treatment plan and what to expect, she completed carpal tunnel surgery on her right hand this past December Discussed patient continues to live with her daughter is available to assist her as needed    Interventions Today    Flowsheet Row Most Recent Value  Chronic Disease   Chronic disease during today's visit Other  [OSA,  sciatica pain,  left carpal tunnel]  General Interventions   General Interventions Discussed/Reviewed General Interventions Discussed, General Interventions Reviewed, Doctor Visits, Durable Medical Equipment (DME)  Doctor Visits Discussed/Reviewed Doctor Visits Discussed, Doctor  Visits Reviewed, Specialist  Durable Medical Equipment (DME) Other  [CPAP]  Exercise Interventions   Exercise Discussed/Reviewed Physical Activity, Exercise Reviewed, Exercise Discussed  Physical Activity Discussed/Reviewed Physical Activity Reviewed, PREP, Physical Activity Discussed  Education Interventions   Education Provided Provided Education          SDOH assessments and interventions completed:  No     Care Coordination Interventions:  Yes, provided   Follow up plan: Follow up call scheduled for 08/30/22 @11 :30 AM    Encounter Outcome:  Pt. Visit Completed

## 2022-08-07 NOTE — Patient Instructions (Addendum)
Visit Information  Thank you for taking time to visit with me today. Please don't hesitate to contact me if I can be of assistance to you.   Following are the goals we discussed today:   Goals Addressed               This Visit's Progress     Patient Stated     I am having more sciatica pain (pt-stated)        Care Coordination Interventions: Reviewed provider established plan for pain management Determined patient has been contacted by the PREP team, however she will undergo carpal tunnel surgery and will plan to start PREP once she has recovered       Other     To undergo carpal tunnel surgery to my left hand        Care Coordination Interventions: Evaluation of current treatment plan related to upcoming carpal tunnel surgery to left hand and patient's adherence to plan as established by provider Determined patient will undergo left carpal tunnel surgery on 08/03/22 by Dr. Janee Morn Assessed for patient knowledge and understanding of her prescribed treatment plan, including what to expect before and after surgery Determined patient is able to verbalize understanding of her treatment plan and what to expect, she completed carpal tunnel surgery on her right hand this past December Discussed patient continues to live with her daughter is available to assist her as needed        Our next appointment is by telephone on 08/30/22 at 11:30 AM  Please call the care guide team at [phone] if you need to cancel or reschedule your appointment.   If you are experiencing a Mental Health or Behavioral Health Crisis or need someone to talk to, please go to [facility] [address], [place] [phone])  Patient verbalizes understanding of instructions and care plan provided today and agrees to view in MyChart. Active MyChart status and patient understanding of how to access instructions and care plan via MyChart confirmed with patient.     Delsa Sale, RN, BSN, CCM Care Management Coordinator [facility] Care Management Direct Phone: [phone]

## 2022-08-15 ENCOUNTER — Encounter: Payer: Self-pay | Admitting: Nurse Practitioner

## 2022-08-15 ENCOUNTER — Ambulatory Visit (INDEPENDENT_AMBULATORY_CARE_PROVIDER_SITE_OTHER): Payer: Medicare Other | Admitting: Nurse Practitioner

## 2022-08-15 VITALS — BP 140/70 | HR 70 | Temp 98.6°F | Ht 64.0 in | Wt 205.4 lb

## 2022-08-15 DIAGNOSIS — M2559 Pain in other specified joint: Secondary | ICD-10-CM

## 2022-08-15 DIAGNOSIS — M25552 Pain in left hip: Secondary | ICD-10-CM | POA: Diagnosis not present

## 2022-08-15 DIAGNOSIS — M2550 Pain in unspecified joint: Secondary | ICD-10-CM | POA: Insufficient documentation

## 2022-08-15 DIAGNOSIS — M25551 Pain in right hip: Secondary | ICD-10-CM | POA: Diagnosis not present

## 2022-08-15 DIAGNOSIS — R252 Cramp and spasm: Secondary | ICD-10-CM

## 2022-08-15 DIAGNOSIS — I110 Hypertensive heart disease with heart failure: Secondary | ICD-10-CM

## 2022-08-15 DIAGNOSIS — Z9989 Dependence on other enabling machines and devices: Secondary | ICD-10-CM

## 2022-08-15 DIAGNOSIS — G4733 Obstructive sleep apnea (adult) (pediatric): Secondary | ICD-10-CM

## 2022-08-15 DIAGNOSIS — I5030 Unspecified diastolic (congestive) heart failure: Secondary | ICD-10-CM | POA: Diagnosis not present

## 2022-08-15 NOTE — Patient Instructions (Signed)
Hypertension, Adult ?Hypertension is another name for high blood pressure. High blood pressure forces your heart to work harder to pump blood. This can cause problems over time. ?There are two numbers in a blood pressure reading. There is a top number (systolic) over a bottom number (diastolic). It is best to have a blood pressure that is below 120/80. ?What are the causes? ?The cause of this condition is not known. Some other conditions can lead to high blood pressure. ?What increases the risk? ?Some lifestyle factors can make you more likely to develop high blood pressure: ?Smoking. ?Not getting enough exercise or physical activity. ?Being overweight. ?Having too much fat, sugar, calories, or salt (sodium) in your diet. ?Drinking too much alcohol. ?Other risk factors include: ?Having any of these conditions: ?Heart disease. ?Diabetes. ?High cholesterol. ?Kidney disease. ?Obstructive sleep apnea. ?Having a family history of high blood pressure and high cholesterol. ?Age. The risk increases with age. ?Stress. ?What are the signs or symptoms? ?High blood pressure may not cause symptoms. Very high blood pressure (hypertensive crisis) may cause: ?Headache. ?Fast or uneven heartbeats (palpitations). ?Shortness of breath. ?Nosebleed. ?Vomiting or feeling like you may vomit (nauseous). ?Changes in how you see. ?Very bad chest pain. ?Feeling dizzy. ?Seizures. ?How is this treated? ?This condition is treated by making healthy lifestyle changes, such as: ?Eating healthy foods. ?Exercising more. ?Drinking less alcohol. ?Your doctor may prescribe medicine if lifestyle changes do not help enough and if: ?Your top number is above 130. ?Your bottom number is above 80. ?Your personal target blood pressure may vary. ?Follow these instructions at home: ?Eating and drinking ? ?If told, follow the DASH eating plan. To follow this plan: ?Fill one half of your plate at each meal with fruits and vegetables. ?Fill one fourth of your plate  at each meal with whole grains. Whole grains include whole-wheat pasta, brown rice, and whole-grain bread. ?Eat or drink low-fat dairy products, such as skim milk or low-fat yogurt. ?Fill one fourth of your plate at each meal with low-fat (lean) proteins. Low-fat proteins include fish, chicken without skin, eggs, beans, and tofu. ?Avoid fatty meat, cured and processed meat, or chicken with skin. ?Avoid pre-made or processed food. ?Limit the amount of salt in your diet to less than 1,500 mg each day. ?Do not drink alcohol if: ?Your doctor tells you not to drink. ?You are pregnant, may be pregnant, or are planning to become pregnant. ?If you drink alcohol: ?Limit how much you have to: ?0-1 drink a day for women. ?0-2 drinks a day for men. ?Know how much alcohol is in your drink. In the [place] one drink equals one 12 oz bottle of beer (355 mL), one 5 oz glass of wine (148 mL), or one 1? oz glass of hard liquor (44 mL). ?Lifestyle ? ?Work with your doctor to stay at a healthy weight or to lose weight. Ask your doctor what the best weight is for you. ?Get at least 30 minutes of exercise that causes your heart to beat faster (aerobic exercise) most days of the week. This may include walking, swimming, or biking. ?Get at least 30 minutes of exercise that strengthens your muscles (resistance exercise) at least 3 days a week. This may include lifting weights or doing Pilates. ?Do not smoke or use any products that contain nicotine or tobacco. If you need help quitting, ask your doctor. ?Check your blood pressure at home as told by your doctor. ?Keep all follow-up visits. ?Medicines ?Take over-the-counter and prescription medicines   only as told by your doctor. Follow directions carefully. ?Do not skip doses of blood pressure medicine. The medicine does not work as well if you skip doses. Skipping doses also puts you at risk for problems. ?Ask your doctor about side effects or reactions to medicines that you should watch  for. ?Contact a doctor if: ?You think you are having a reaction to the medicine you are taking. ?You have headaches that keep coming back. ?You feel dizzy. ?You have swelling in your ankles. ?You have trouble with your vision. ?Get help right away if: ?You get a very bad headache. ?You start to feel mixed up (confused). ?You feel weak or numb. ?You feel faint. ?You have very bad pain in your: ?Chest. ?Belly (abdomen). ?You vomit more than once. ?You have trouble breathing. ?These symptoms may be an emergency. Get help right away. Call 911. ?Do not wait to see if the symptoms will go away. ?Do not drive yourself to the hospital. ?Summary ?Hypertension is another name for high blood pressure. ?High blood pressure forces your heart to work harder to pump blood. ?For most people, a normal blood pressure is less than 120/80. ?Making healthy choices can help lower blood pressure. If your blood pressure does not get lower with healthy choices, you may need to take medicine. ?This information is not intended to replace advice given to you by your health care provider. Make sure you discuss any questions you have with your health care provider. ?Document Revised: 12/30/2020 Document Reviewed: 12/30/2020 ?Elsevier Patient Education ? 2023 [employer] ? ?

## 2022-08-15 NOTE — Progress Notes (Signed)
Hershal Coria Martin,acting as a Neurosurgeon for Arnette Felts, FNP.,have documented all relevant documentation on the behalf of Arnette Felts, FNP,as directed by  Arnette Felts, FNP while in the presence of Arnette Felts, FNP.    Subjective:     Patient ID: Brandi [name] , female    DOB: 06-11-1955 , 67 y.o.   MRN: [id number]   Chief Complaint  Patient presents with   Hypertension    HPI  Patient presents today for bp, dm, and chol check patient reports she compliance with medications. Patient has some concerns about her meloxicam 7.5mg  she reports not taking it.  She has been using her CPAP more recently had stopped as she felt she was doing well.   She is having more calf cramps bilateral legs. Does not wear TED hose at this time.   She is scheduled to have carpal tunnel surgery to her left hand at the end of this month  BP Readings from Last 3 Encounters: 08/15/22 : (!) 140/70 06/20/22 : 128/82 06/19/22 : (!) 163/98       Past Medical History:  Diagnosis Date   Anemia    Arthritis    Back pain    Edema, lower extremity    Food allergy    GERD (gastroesophageal reflux disease)    Hypertension    Hypertensive crisis    Joint pain    Morbid obesity (HCC)    Osteoporosis    Pre-diabetes    Rheumatoid arthritis (HCC)    Sciatica neuralgia    Sleep apnea      Family History  Problem Relation Age of Onset   Hypertension Mother    Cancer Mother    Heart disease Mother    Eating disorder Mother    Obesity Mother    Kidney disease Father    Heart disease Father    Diabetes Father    High blood pressure Father    High Cholesterol Father    Anxiety disorder Father    Bipolar disorder Father    Schizophrenia Father    Hypertension Sister      Current Outpatient Medications:    amLODipine (NORVASC) 5 MG tablet, Take 1.5 tablets (7.5 mg total) by mouth daily. (Patient taking differently: Take 5 mg by mouth daily. Patient is taking 1 tablet at bedtime daily.), Disp:  135 tablet, Rfl: 3   atorvastatin (LIPITOR) 20 MG tablet, Take 1 tablet (20 mg total) by mouth daily., Disp: 90 tablet, Rfl: 3   Biotin 1000 MCG tablet, Take 1,000 mcg by mouth every morning., Disp: , Rfl:    cetirizine (ZYRTEC) 10 MG tablet, Take 10 mg by mouth every morning., Disp: , Rfl:    fluticasone (FLONASE) 50 MCG/ACT nasal spray, 1 spray daily as needed for allergies or rhinitis., Disp: , Rfl:    Magnesium 500 MG TABS, Take 500 mg by mouth at bedtime., Disp: , Rfl:    mupirocin ointment (BACTROBAN) 2 %, Apply 1 Application topically daily., Disp: 22 g, Rfl: 0   Nebivolol HCl (BYSTOLIC) 20 MG TABS, Take 1 tablet by mouth once daily, Disp: 90 tablet, Rfl: 3   omeprazole (PRILOSEC OTC) 20 MG tablet, Take 20 mg by mouth every morning., Disp: , Rfl:    Semaglutide, 2 MG/DOSE, (OZEMPIC, 2 MG/DOSE,) 8 MG/3ML SOPN, Inject 2 mg into the skin once a week., Disp: 3 mL, Rfl: 0   tizanidine (ZANAFLEX) 2 MG capsule, TAKE 1 CAPSULE(2 MG) BY MOUTH THREE TIMES DAILY AS NEEDED FOR MUSCLE  SPASMS, Disp: 60 capsule, Rfl: 1   topiramate (TOPAMAX) 50 MG tablet, Take 1 tablet (50 mg total) by mouth 2 (two) times daily., Disp: 180 tablet, Rfl: 3   traMADol (ULTRAM) 50 MG tablet, Take 1 tablet (50 mg total) by mouth every 6 (six) hours as needed (pain)., Disp: 20 tablet, Rfl: 0   triamcinolone ointment (KENALOG) 0.1 %, Apply 1 Application topically 2 (two) times daily. (Patient taking differently: Apply 1 Application topically 2 (two) times daily as needed.), Disp: 30 g, Rfl: 2   valsartan (DIOVAN) 160 MG tablet, TAKE 1 TABLET(160 MG) BY MOUTH DAILY, Disp: 90 tablet, Rfl: 3   Vitamin E 180 MG CAPS, Take 180 mg by mouth every morning., Disp: , Rfl:    Cyanocobalamin (VITAMIN B-12 PO), Take 1 drop by mouth daily. (Patient not taking: Reported on 07/11/2022), Disp: , Rfl:    meloxicam (MOBIC) 7.5 MG tablet, Take 7.5 mg by mouth daily. (Patient not taking: Reported on 08/15/2022), Disp: , Rfl:    OVER THE COUNTER  MEDICATION, Take 30 mg by mouth 2 (two) times daily. Strengtia 30 mg (Patient not taking: Reported on 07/11/2022), Disp: , Rfl:    OVER THE COUNTER MEDICATION, Take 1 tablet by mouth 2 (two) times daily. Hepato - C1 (Patient not taking: Reported on 07/11/2022), Disp: , Rfl:    OVER THE COUNTER MEDICATION, Take 1 capsule by mouth 2 (two) times daily. Omega CO3-SE (Patient not taking: Reported on 07/11/2022), Disp: , Rfl:    Allergies  Allergen Reactions   Hydralazine Itching   Meperidine Hcl Rash and Other (See Comments)    hallucinations   Aliskiren Hives   Aspirin Nausea Only   Juniper Berries Hives   Latex Hives   Rofecoxib Diarrhea    Reaction to Vioxx   Spironolactone Other (See Comments)    High doses caused nausea and rash - may can tolerate low doses   Chocolate Nausea And Vomiting and Rash   Furosemide Other (See Comments)    Rash and extreme dehydration   Oxycodone-Acetaminophen Itching and Rash   Sulfa Antibiotics Rash     Review of Systems  Constitutional: Negative.  Negative for fatigue.  Respiratory: Negative.    Cardiovascular: Negative.  Negative for chest pain, palpitations and leg swelling.  Endocrine: Negative for polydipsia, polyphagia and polyuria.  Neurological: Negative.  Negative for dizziness and headaches.  Psychiatric/Behavioral: Negative.       Today's Vitals   08/15/22 1102  BP: (!) 140/70  Pulse: 70  Temp: 98.6 F (37 C)  TempSrc: Oral  Weight: 205 lb 6.4 oz (93.2 kg)  Height: 5\' 4"  (1.626 m)  PainSc: 0-No pain   Body mass index is 35.26 kg/m.  Wt Readings from Last 3 Encounters:  08/15/22 205 lb 6.4 oz (93.2 kg)  06/20/22 203 lb (92.1 kg)  06/19/22 201 lb (91.2 kg)    The 10-year ASCVD risk score (Arnett DK, et al., 2019) is: 24.8%   Values used to calculate the score:     Age: 67 years     Sex: Female     Is Non-Hispanic African American: Yes     Diabetic: Yes     Tobacco smoker: No     Systolic Blood Pressure: 140 mmHg     Is BP  treated: Yes     HDL Cholesterol: 71 mg/dL     Total Cholesterol: 161 mg/dL  Objective:  Physical Exam Vitals reviewed.  Constitutional:      General: She  is not in acute distress.    Appearance: Normal appearance. She is obese.  Eyes:     Pupils: Pupils are equal, round, and reactive to light.  Cardiovascular:     Rate and Rhythm: Normal rate and regular rhythm.     Pulses: Normal pulses.     Heart sounds: Normal heart sounds. No murmur heard. Pulmonary:     Effort: Pulmonary effort is normal. No respiratory distress.     Breath sounds: Normal breath sounds. No wheezing.  Musculoskeletal:        General: No swelling or tenderness. Normal range of motion.     Right lower leg: No edema.     Left lower leg: No edema.  Skin:    General: Skin is warm and dry.     Capillary Refill: Capillary refill takes less than 2 seconds.  Neurological:     General: No focal deficit present.     Mental Status: She is alert and oriented to person, place, and time.     Cranial Nerves: No cranial nerve deficit.     Motor: No weakness.  Psychiatric:        Mood and Affect: Mood normal.        Behavior: Behavior normal.        Thought Content: Thought content normal.        Judgment: Judgment normal.         Assessment And Plan:     1. Bilateral hip pain  2. 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  3. Pain in other joint Comments: She is encouraged to take tylenol scheduled. Continue exercising as tolerated.  4. Muscle cramps Comments: Advised to stay well hydrated with water.  5. Obstructive sleep apnea syndrome Comments: She is advised to contact Neurology about her CPAP.  6. CPAP (continuous positive airway pressure) dependence Comments: She has not been using regularly but has been losing more weight.    Return for BP f/u in Sept.  Patient was given opportunity to ask  questions. Patient verbalized understanding of the plan and was able to repeat key elements of the plan. All questions were answered to their satisfaction.  Arnette Felts, FNP   I, Arnette Felts, FNP, have reviewed all documentation for this visit. The documentation on 08/15/22 for the exam, diagnosis, procedures, and orders are all accurate and complete.   IF YOU HAVE BEEN REFERRED TO A SPECIALIST, IT MAY TAKE 1-2 WEEKS TO SCHEDULE/PROCESS THE REFERRAL. IF YOU HAVE NOT HEARD FROM US/SPECIALIST IN TWO WEEKS, PLEASE GIVE [name] A CALL AT [phone].   THE PATIENT IS ENCOURAGED TO PRACTICE SOCIAL DISTANCING DUE TO THE COVID-19 PANDEMIC.

## 2022-08-23 DIAGNOSIS — G5602 Carpal tunnel syndrome, left upper limb: Secondary | ICD-10-CM | POA: Diagnosis not present

## 2022-08-23 DIAGNOSIS — G5622 Lesion of ulnar nerve, left upper limb: Secondary | ICD-10-CM | POA: Diagnosis not present

## 2022-08-27 DIAGNOSIS — Z9989 Dependence on other enabling machines and devices: Secondary | ICD-10-CM | POA: Insufficient documentation

## 2022-08-30 ENCOUNTER — Ambulatory Visit: Payer: Self-pay

## 2022-08-30 NOTE — Patient Instructions (Signed)
Visit Information  Thank you for taking time to visit with me today. Please don't hesitate to contact me if I can be of assistance to you.   Following are the goals we discussed today:   Goals Addressed             This Visit's Progress    To undergo carpal tunnel surgery to my left hand       Care Coordination Interventions: Evaluation of current treatment plan related to upcoming carpal tunnel surgery to left hand and patient's adherence to plan as established by provider Discussed and reviewed with patient she underwent left carpal tunnel repair on 08/23/22 Reviewed and discussed post operative instructions and when to call the doctor if needed Determined a soft cast was placed following the procedure due to the incision is from elbow down, she is keeping the cast dry and reports good movement and temperature to her fingers, she has minimal pain and voices having no c/o related to her recovery thus far, her daughter is assisting her with ADL/iADL's as needed Discussed patient will contact the surgeon to schedule a post operative follow up, her daughter is available to provide transportation         Our next appointment is by telephone on 09/27/22 at 11:00 AM   Please call the care guide team at [phone] if you need to cancel or reschedule your appointment.   If you are experiencing a Mental Health or Behavioral Health Crisis or need someone to talk to, please call 1-800-273-TALK (toll free, 24 hour hotline)  Patient verbalizes understanding of instructions and care plan provided today and agrees to view in MyChart. Active MyChart status and patient understanding of how to access instructions and care plan via MyChart confirmed with patient.     Delsa Sale, RN, BSN, CCM Care Management Coordinator [facility] Care Management  Direct Phone: [phone]

## 2022-08-30 NOTE — Patient Outreach (Signed)
  Care Coordination   Follow Up Visit Note   08/30/2022 Name: SHALEI NEYLON MRN: [id number] DOB: [birth year]  HINAKO DEVINCENT is a 67 y.o. year old female who sees Arnette Felts, FNP for primary care. I spoke with  Conley Rolls by phone today.  What matters to the patients health and wellness today?  Patient would like to make a full recovery from her carpal tunnel repair without complications.     Goals Addressed             This Visit's Progress    To undergo carpal tunnel surgery to my left hand       Care Coordination Interventions: Evaluation of current treatment plan related to upcoming carpal tunnel surgery to left hand and patient's adherence to plan as established by provider Discussed and reviewed with patient she underwent left carpal tunnel repair on 08/23/22 Reviewed and discussed post operative instructions and when to call the doctor if needed Determined a soft cast was placed following the procedure due to the incision is from elbow down, she is keeping the cast dry and reports good movement and temperature to her fingers, she has minimal pain and voices having no c/o related to her recovery thus far, her daughter is assisting her with ADL/iADL's as needed Discussed patient will contact the surgeon to schedule a post operative follow up, her daughter is available to provide transportation     Interventions Today    Flowsheet Row Most Recent Value  Chronic Disease   Chronic disease during today's visit Other  [s/p left carpal tunnel repair]  General Interventions   General Interventions Discussed/Reviewed General Interventions Discussed, General Interventions Reviewed, Doctor Visits  Doctor Visits Discussed/Reviewed Doctor Visits Discussed, Specialist, Doctor Visits Reviewed  Education Interventions   Education Provided Provided Education  Provided Verbal Education On When to see the doctor          SDOH assessments and interventions completed:  No      Care Coordination Interventions:  Yes, provided   Follow up plan: Follow up call scheduled for 09/27/22 @11 :00 AM    Encounter Outcome:  Pt. Visit Completed

## 2022-08-31 ENCOUNTER — Other Ambulatory Visit (HOSPITAL_BASED_OUTPATIENT_CLINIC_OR_DEPARTMENT_OTHER): Payer: Self-pay

## 2022-08-31 ENCOUNTER — Telehealth: Payer: Self-pay

## 2022-08-31 ENCOUNTER — Encounter (HOSPITAL_BASED_OUTPATIENT_CLINIC_OR_DEPARTMENT_OTHER): Payer: Self-pay | Admitting: Pharmacist

## 2022-08-31 ENCOUNTER — Ambulatory Visit (INDEPENDENT_AMBULATORY_CARE_PROVIDER_SITE_OTHER): Payer: Medicare Other | Admitting: Bariatrics

## 2022-08-31 ENCOUNTER — Encounter: Payer: Self-pay | Admitting: Bariatrics

## 2022-08-31 VITALS — BP 148/88 | HR 59 | Temp 98.0°F | Ht 64.0 in | Wt 199.0 lb

## 2022-08-31 DIAGNOSIS — E1169 Type 2 diabetes mellitus with other specified complication: Secondary | ICD-10-CM

## 2022-08-31 DIAGNOSIS — Z6834 Body mass index (BMI) 34.0-34.9, adult: Secondary | ICD-10-CM

## 2022-08-31 DIAGNOSIS — Z7985 Long-term (current) use of injectable non-insulin antidiabetic drugs: Secondary | ICD-10-CM

## 2022-08-31 DIAGNOSIS — I10 Essential (primary) hypertension: Secondary | ICD-10-CM | POA: Diagnosis not present

## 2022-08-31 MED ORDER — TIRZEPATIDE 10 MG/0.5ML ~~LOC~~ SOAJ
10.0000 mg | SUBCUTANEOUS | 0 refills | Status: DC
  Filled 2022-08-31: qty 2, 28d supply, fill #0

## 2022-08-31 MED ORDER — ONDANSETRON HCL 4 MG PO TABS
4.0000 mg | ORAL_TABLET | Freq: Three times a day (TID) | ORAL | 0 refills | Status: AC | PRN
  Filled 2022-08-31 – 2022-09-11 (×2): qty 20, 7d supply, fill #0

## 2022-08-31 NOTE — Progress Notes (Signed)
WEIGHT SUMMARY AND BIOMETRICS  Weight Lost Since Last Visit: 2lb  No data recorded  Vitals Temp: 98 F (36.7 C) BP: (!) 148/88 Pulse Rate: (!) 59 SpO2: 100 %   Anthropometric Measurements Height: 5\' 4"  (1.626 m) Weight: 199 lb (90.3 kg) BMI (Calculated): 34.14 Weight at Last Visit: 201lb Weight Lost Since Last Visit: 2lb Starting Weight: 273lb Total Weight Loss (lbs): 74 lb (33.6 kg)   Body Composition  Body Fat %: 49.6 % Fat Mass (lbs): 98.8 lbs Muscle Mass (lbs): 95.2 lbs Visceral Fat Rating : 15   Other Clinical Data Fasting: Yes Labs: No Today's Visit #: 25 Starting Date: 08/25/20    OBESITY Tuscany is here to discuss her progress with her obesity treatment plan along with follow-up of her obesity related diagnoses.     Nutrition Plan: the Category 1 plan - 50 % adherence.  Current exercise: walking  Interim History:  She is down 2 lbs.  She is doing well with weight loss, but appetite and cravings are up.  Protein intake is as prescribed, Is exceeding snack calorie allotment, Not journaling consistently., and Water intake is adequate.  Pharmacotherapy: Jassmin is on Ozempic 2 mg SQ weekly Adverse side effects: None Hunger is poorly controlled.  Cravings are poorly controlled.   Assessment/Plan:   1. Type 2 diabetes mellitus with other specified complication, without long-term current use of insulin (HCC) Type II Diabetes HgbA1c is not at goal. Last A1c was 5.1 CBGs: Not checking      Episodes of hypoglycemia: no Medication(s): Ozempic 2 mg SQ weekly  Lab Results  Component Value Date   HGBA1C 5.1 06/20/2022   HGBA1C 5.3 01/31/2022   HGBA1C 5.3 09/28/2021   Lab Results  Component Value Date   MICROALBUR 10 03/11/2020   LDLCALC 74 06/20/2022   CREATININE 0.85 06/20/2022   No results found for: "GFR"  Plan: Start Mounjaro 10 mg SQ weekly 1 month supply with no refills. Will stop the Ozempic.  Continue all other medications.   Will keep all carbohydrates low both sweets and starches.  Will continue exercise regimen to 30 to 60 minutes on most days of the week.  Aim for 7 to 9 hours of sleep nightly.  Eat more low glycemic index foods.   2. Hypertension  stable.  Medication(s): Amlodipine 5 mg 1 daily , Bystolic 20 mg, and Diovan 160 mg  BP Readings from Last 3 Encounters:  08/31/22 (!) 148/88  08/15/22 (!) 140/70  06/20/22 128/82   Lab Results  Component Value Date   CREATININE 0.85 06/20/2022   CREATININE 0.99 01/31/2022   CREATININE 0.93 09/28/2021   No results found for: "GFR"  Plan: Continue all antihypertensives at current dosages. No added salt. Will keep sodium content to 1,500 mg or less per day.     Generalized Obesity: Current BMI BMI (Calculated): 34.14   Pharmacotherapy Plan Start  Mounjaro 10 mg SQ weekly, Will stop the Ozempic and begin Mounjaro.   Larose is currently in the action stage of change. As such, her goal is to continue with weight loss efforts.  She has agreed to the Category 1 plan.  Exercise goals: Older adults should do exercises that maintain or improve balance if they are at risk of falling.   Behavioral modification strategies: decrease junk food, get rid of junk food in the home, decrease snacking , ways to avoid boredom eating, ways to avoid night time snacking, and avoiding temptations.  Tequia has agreed to follow-up  with our clinic in 4 weeks.   No orders of the defined types were placed in this encounter.   Medications Discontinued During This Encounter  Medication Reason   Semaglutide, 2 MG/DOSE, (OZEMPIC, 2 MG/DOSE,) 8 MG/3ML SOPN      Meds ordered this encounter  Medications   tirzepatide (MOUNJARO) 10 MG/0.5ML Pen    Sig: Inject 10 mg into the skin once a week.    Dispense:  2 mL    Refill:  0   ondansetron (ZOFRAN) 4 MG tablet    Sig: Take 1 tablet (4 mg total) by mouth every 8 (eight) hours as needed for nausea or vomiting.    Dispense:   20 tablet    Refill:  0      Objective:   VITALS: Per patient if applicable, see vitals. GENERAL: Alert and in no acute distress. CARDIOPULMONARY: No increased WOB. Speaking in clear sentences.  PSYCH: Pleasant and cooperative. Speech normal rate and rhythm. Affect is appropriate. Insight and judgement are appropriate. Attention is focused, linear, and appropriate.  NEURO: Oriented as arrived to appointment on time with no prompting.   Attestation Statements:   This was prepared with the assistance of Engineer, civil (consulting).  Occasional wrong-word or sound-a-like substitutions may have occurred due to the inherent limitations of voice recognition software.    [name], DO

## 2022-08-31 NOTE — Telephone Encounter (Signed)
Received through Cover My Meds: Mounjaro approved. The authorization is valid from 08/01/2022 through 08/31/2023.

## 2022-08-31 NOTE — Telephone Encounter (Signed)
PA submitted through Cover My Meds for [facility]. Awaiting insurance determination. Key: [id number]

## 2022-09-01 ENCOUNTER — Telehealth: Payer: Self-pay

## 2022-09-01 NOTE — Telephone Encounter (Signed)
Called to discuss PREP schedule at Brandi [name], she had her carpal tunnel surgery, sees surgeon as follow up next week; asked her to check to see if she would be cleared to start PREP on July 1; she will call me back after her appointment to confirm.

## 2022-09-03 ENCOUNTER — Other Ambulatory Visit (HOSPITAL_BASED_OUTPATIENT_CLINIC_OR_DEPARTMENT_OTHER): Payer: Self-pay

## 2022-09-04 ENCOUNTER — Other Ambulatory Visit (HOSPITAL_BASED_OUTPATIENT_CLINIC_OR_DEPARTMENT_OTHER): Payer: Self-pay

## 2022-09-05 ENCOUNTER — Other Ambulatory Visit (HOSPITAL_BASED_OUTPATIENT_CLINIC_OR_DEPARTMENT_OTHER): Payer: Self-pay

## 2022-09-06 ENCOUNTER — Other Ambulatory Visit: Payer: Self-pay

## 2022-09-11 ENCOUNTER — Other Ambulatory Visit (HOSPITAL_BASED_OUTPATIENT_CLINIC_OR_DEPARTMENT_OTHER): Payer: Self-pay

## 2022-09-13 ENCOUNTER — Telehealth: Payer: Self-pay

## 2022-09-13 NOTE — Telephone Encounter (Signed)
Called to confirm participation in next PREP class at [facility]; left voicemail requesting call back

## 2022-09-14 ENCOUNTER — Telehealth: Payer: Self-pay

## 2022-09-14 NOTE — Telephone Encounter (Signed)
Called to confirm she wants to attend PREP class on July 1, assessment visit scheduled for June 27 at 3:30

## 2022-09-21 NOTE — Progress Notes (Signed)
[facility] PREP Evaluation  Patient Details  Name: Brandi [name] MRN: [id number] Date of Birth: Aug 14, 1955 Age: 67 y.o. PCP: Arnette Felts, FNP  Vitals:   09/21/22 1609  BP: (!) 150/88  Pulse: 67  SpO2: 99%  Weight: 205 lb 12.8 oz (93.4 kg)     [facility] Eval - 09/21/22 1600       [facility] "PREP" Location   [facility] "PREP" Location [facility]      Referral    Referring Provider Moore    Reason for referral Diabetes;Heart Failure;Hypertension;Obesitity/Overweight;Inactivity    Program Start Date 09/25/22      Measurement   Waist Circumference 48.5 inches    Hip Circumference 50 inches    Body fat --   E2     Information for Trainer   Goals --   Establish exercise routine/program to increase stamina, balance, strength; wants to return to the pool for exercise and walking   Current Exercise walking    Orthopedic Concerns --   Bilat THR and TKA; recent L hip dislocation   Pertinent Medical History --   HTN, CHF, OSA, diabetes   Restrictions/Precautions Fall risk;Assistive device    Medications that affect exercise Beta blocker      Timed Up and Go (TUGS)   Timed Up and Go Moderate risk 10-12 seconds      Mobility and Daily Activities   I find it easy to walk up or down two or more flights of stairs. 1    I have no trouble taking out the trash. 4    I do housework such as vacuuming and dusting on my own without difficulty. 1    I can easily lift a gallon of milk (8lbs). 4    I can easily walk a mile. 1    I have no trouble reaching into high cupboards or reaching down to pick up something from the floor. 1    I do not have trouble doing out-door work such as Loss adjuster, chartered, raking leaves, or gardening. 1      Mobility and Daily Activities   I feel younger than my age. 2    I feel independent. 2    I feel energetic. 2    I live an active life.  2    I feel strong. 2    I feel healthy. 2    I feel active as other people my age. 2      How fit and strong are you.   Fit  and Strong Total Score 27            Past Medical History:  Diagnosis Date   Anemia    Arthritis    Back pain    Edema, lower extremity    Food allergy    GERD (gastroesophageal reflux disease)    Hypertension    Hypertensive crisis    Joint pain    Morbid obesity (HCC)    Osteoporosis    Pre-diabetes    Rheumatoid arthritis (HCC)    Sciatica neuralgia    Sleep apnea    Past Surgical History:  Procedure Laterality Date   ABDOMINAL HYSTERECTOMY     PARTIAL   CARPAL TUNNEL RELEASE Right    02/2022   CHOLECYSTECTOMY     ORIF ELBOW FRACTURE Right    ORIF WRIST FRACTURE Right    PROTESTIC HIPS     PROTESTIC KNEE     Social History   Tobacco Use  Smoking Status  Never  Smokeless Tobacco Never  To begin PREP at [facility] July 1, every M/W 12:30-1:45  Sonia Baller 09/21/2022, 4:13 PM

## 2022-09-25 NOTE — Progress Notes (Signed)
[facility] PREP Weekly Session  Patient Details  Name: Brandi [name] MRN: [id number] Date of Birth: July 01, 1955 Age: 67 y.o. PCP: Arnette Felts, FNP  There were no vitals filed for this visit.   [facility] Weekly seesion - 09/25/22 1400       [facility] "PREP" Location   [facility] "PREP" Location [facility]      Weekly Session   Topic Discussed Goal setting and welcome to the program   Introductions, review of notebook, tour of facility   Classes attended to date 1             [name] 09/25/2022, 2:42 PM

## 2022-09-27 ENCOUNTER — Ambulatory Visit: Payer: Self-pay

## 2022-09-27 NOTE — Patient Outreach (Signed)
  Care Coordination   09/27/2022 Name: Brandi [name] MRN: [id number] DOB: Apr 18, 1955   Care Coordination Outreach Attempts:  An unsuccessful telephone outreach was attempted for a scheduled appointment today.  Follow Up Plan:  Additional outreach attempts will be made to offer the patient care coordination information and services.   Encounter Outcome:  No Answer   Care Coordination Interventions:  No, not indicated    Delsa Sale, RN, BSN, CCM Care Management Coordinator [facility] Care Management  Direct Phone: [phone]

## 2022-10-02 ENCOUNTER — Telehealth: Payer: Self-pay | Admitting: *Deleted

## 2022-10-02 NOTE — Progress Notes (Unsigned)
  Care Coordination Note  10/02/2022 Name: Brandi [name] MRN: [id number] DOB: 04/07/1955  Brandi [name] is a 67 y.o. year old female who is a primary care patient of Arnette Felts, FNP and is actively engaged with the care management team. I reached out to Conley Rolls by phone today to assist with re-scheduling a follow up visit with the RN Case Manager  Follow up plan: Unsuccessful telephone outreach attempt made. A HIPAA compliant phone message was left for the patient providing contact information and requesting a return call.   [facility]  Care Coordination Care Guide  Direct Dial: [phone]

## 2022-10-02 NOTE — Progress Notes (Signed)
[facility] PREP Weekly Session  Patient Details  Name: JOBYNA WEIMAR MRN: [id number] Date of Birth: 22-Feb-1956 Age: 67 y.o. PCP: Arnette Felts, FNP  Vitals:   10/02/22 1355  Weight: 202 lb (91.6 kg)     [facility] Weekly seesion - 10/02/22 1300       [facility] "PREP" Location   [facility] "PREP" Location [facility]      Weekly Session   Topic Discussed Importance of resistance training;Other ways to be active   Goals: work up to 150 minutes/wk of cardio; strength training 2-3 times/wk for 20-40 minutes; sitting no more than 30 minutes   Minutes exercised this week 45 minutes    Classes attended to date 3             Lotus Gover B Janey Petron 10/02/2022, 1:55 PM

## 2022-10-03 NOTE — Progress Notes (Signed)
  Care Coordination Note  10/03/2022 Name: SLOANE HUDDLESTON MRN: [id number] DOB: 10-21-1955  SOCORRA BALTES is a 67 y.o. year old female who is a primary care patient of Arnette Felts, FNP and is actively engaged with the care management team. I reached out to Conley Rolls by phone today to assist with re-scheduling a follow up visit with the RN Case Manager  Follow up plan: Telephone appointment with care management team member scheduled for:10/19/22  [facility] Coordination Care Guide  Direct Dial: [phone]

## 2022-10-03 NOTE — Progress Notes (Signed)
  Care Coordination Note  10/03/2022 Name: Brandi [name] MRN: [id number] DOB: February 28, 1956  Brandi [name] is a 66 y.o. year old female who is a primary care patient of Arnette Felts, FNP and is actively engaged with the care management team. I reached out to Conley Rolls by phone today to assist with re-scheduling a follow up visit with the RN Case Manager  Follow up plan: Unsuccessful telephone outreach attempt made. A HIPAA compliant phone message was left for the patient providing contact information and requesting a return call.  [facility]  Care Coordination Care Guide  Direct Dial: [phone]

## 2022-10-09 ENCOUNTER — Other Ambulatory Visit (HOSPITAL_BASED_OUTPATIENT_CLINIC_OR_DEPARTMENT_OTHER): Payer: Self-pay

## 2022-10-09 ENCOUNTER — Ambulatory Visit (INDEPENDENT_AMBULATORY_CARE_PROVIDER_SITE_OTHER): Payer: Medicare Other | Admitting: Bariatrics

## 2022-10-09 ENCOUNTER — Encounter: Payer: Self-pay | Admitting: Bariatrics

## 2022-10-09 VITALS — BP 146/88 | HR 69 | Temp 98.2°F | Ht 64.0 in | Wt 200.0 lb

## 2022-10-09 DIAGNOSIS — Z6834 Body mass index (BMI) 34.0-34.9, adult: Secondary | ICD-10-CM | POA: Diagnosis not present

## 2022-10-09 DIAGNOSIS — I10 Essential (primary) hypertension: Secondary | ICD-10-CM | POA: Diagnosis not present

## 2022-10-09 DIAGNOSIS — E669 Obesity, unspecified: Secondary | ICD-10-CM | POA: Diagnosis not present

## 2022-10-09 DIAGNOSIS — E1169 Type 2 diabetes mellitus with other specified complication: Secondary | ICD-10-CM | POA: Diagnosis not present

## 2022-10-09 DIAGNOSIS — Z7985 Long-term (current) use of injectable non-insulin antidiabetic drugs: Secondary | ICD-10-CM

## 2022-10-09 MED ORDER — TIRZEPATIDE 10 MG/0.5ML ~~LOC~~ SOAJ
10.0000 mg | SUBCUTANEOUS | 0 refills | Status: DC
  Filled 2022-10-09: qty 2, 28d supply, fill #0

## 2022-10-09 NOTE — Progress Notes (Signed)
[name] PREP Weekly Session  Patient Details  Name: Brandi [name] MRN: [id number] Date of Birth: 1955/08/23 Age: 67 y.o. PCP: Arnette Felts, FNP  Vitals:   10/09/22 1350  Weight: 199 lb (90.3 kg)     [name] Weekly seesion - 10/09/22 1300       [name] "PREP" Location   [name] "PREP" Location [name]      Weekly Session   Topic Discussed Healthy eating tips   Eat the rainbow of colors; introduced YUKA app; foods to reduce, foods to increase   Minutes exercised this week 85 minutes    Classes attended to date 5             Brandi [name] Brandi [name] 10/09/2022, 1:51 PM

## 2022-10-09 NOTE — Progress Notes (Signed)
WEIGHT SUMMARY AND BIOMETRICS  Weight Gained Since Last Visit: 1lb   Vitals Temp: 98.2 F (36.8 C) BP: (!) 146/88 Pulse Rate: 69 SpO2: 100 %   Anthropometric Measurements Height: 5\' 4"  (1.626 m) Weight: 200 lb (90.7 kg) BMI (Calculated): 34.31 Weight at Last Visit: 199lb Weight Gained Since Last Visit: 1lb Starting Weight: 273lb Total Weight Loss (lbs): 73 lb (33.1 kg)   Body Composition  Body Fat %: 50.4 % Fat Mass (lbs): 101.2 lbs Muscle Mass (lbs): 94.6 lbs Visceral Fat Rating : 15   Other Clinical Data Fasting: no Labs: no Today's Visit #: 26 Starting Date: 08/25/20    OBESITY Brandi [name] is here to discuss her progress with her obesity treatment plan along with follow-up of her obesity related diagnoses.     Nutrition Plan: the Category 1 plan - 100% adherence.  Current exercise:  [facility]  Interim History:  She is up 1 lb since her last visit.  Eating all of the food on the plan., Is not skipping meals, and Water intake is adequate.  Pharmacotherapy: Brandi [name] is on Mounjaro 10 mg SQ weekly Adverse side effects: None Hunger is moderately controlled.  Cravings are moderately controlled.  Assessment/Plan:   Type II Diabetes HgbA1c is at goal. Last A1c was 5.1 CBGs: Not checking      Episodes of hypoglycemia: no Medication(s): Mounjaro 10 mg SQ weekly  Lab Results  Component Value Date   HGBA1C 5.1 06/20/2022   HGBA1C 5.3 01/31/2022   HGBA1C 5.3 09/28/2021   Lab Results  Component Value Date   MICROALBUR 10 03/11/2020   LDLCALC 74 06/20/2022   CREATININE 0.85 06/20/2022   No results found for: "GFR"  Plan: Continue and refill Mounjaro 10 mg SQ weekly Continue all other medications.  Will keep all carbohydrates low both sweets and starches.  Will continue exercise regimen to 30 to 60 minutes on most days of the week.  Aim for 7 to 9 hours of sleep nightly.  Eat more low glycemic index foods.  Will pick healthier snacks.    Hypertension Hypertension reasonably well controlled.  Medication(s): Amlodipine 5 mg 1 daily , Bystolic 20 mg, and Diovan 160 mg  BP Readings from Last 3 Encounters:  10/09/22 (!) 146/88  09/21/22 (!) 150/88  08/31/22 (!) 148/88   Lab Results  Component Value Date   CREATININE 0.85 06/20/2022   CREATININE 0.99 01/31/2022   CREATININE 0.93 09/28/2021    Plan: Continue all antihypertensives at current dosages. No added salt. Will keep sodium content to 1,500 mg or less per day.     Generalized Obesity: Current BMI BMI (Calculated): 34.31   Pharmacotherapy Plan Continue and refill  Mounjaro 10 mg SQ weekly  Brandi [name] is currently in the action stage of change. As such, her goal is to continue with weight loss efforts.  She has agreed to the Category 1 plan.  Exercise goals: Older adults should determine their level of effort for physical activity relative to their level of fitness.   Behavioral modification strategies: increasing lean protein intake, decreasing simple carbohydrates , no meal skipping, meal planning , better snacking choices, planning for success, decrease junk food, and get rid of junk food in the home.  Brandi [name] has agreed to follow-up with our clinic in 4 weeks.       Objective:   VITALS: Per patient if applicable, see vitals. GENERAL: Alert and in no acute distress. CARDIOPULMONARY: No increased WOB. Speaking in clear sentences.  PSYCH: Pleasant and cooperative.  Speech normal rate and rhythm. Affect is appropriate. Insight and judgement are appropriate. Attention is focused, linear, and appropriate.  NEURO: Oriented as arrived to appointment on time with no prompting.   Attestation Statements:    This was prepared with the assistance of Engineer, civil (consulting).  Occasional wrong-word or sound-a-like substitutions may have occurred due to the inherent limitations of voice recognition software.   [name], DO

## 2022-10-16 NOTE — Progress Notes (Signed)
[facility] PREP Weekly Session  Patient Details  Name: GLINDA NATZKE MRN: [id number] Date of Birth: [birth year] Age: 67 y.o. PCP: Arnette Felts, FNP  Vitals:   10/16/22 1359  Weight: 204 lb (92.5 kg)     [facility] Weekly seesion - 10/16/22 1300       [facility] "PREP" Location   [facility] "PREP" Location [facility]      Weekly Session   Topic Discussed Health habits   Sugar demo; water: 1/2 body wt in oz   Minutes exercised this week 40 minutes    Classes attended to date 7             Temara Lanum B Devika Dragovich 10/16/2022, 1:59 PM

## 2022-10-19 ENCOUNTER — Ambulatory Visit: Payer: Self-pay

## 2022-10-19 NOTE — Patient Outreach (Signed)
  Care Coordination   Follow Up Visit Note   10/19/2022 Name: Brandi [name] MRN: [id number] DOB: May 19, 1955  Brandi [name] is a 67 y.o. year old female who sees Arnette Felts, FNP for primary care. I spoke with  Conley Rolls by phone today.  What matters to the patients health and wellness today?  Patient will monitor her BP, checking a few times a week at different times of the day and record her readings to share with her PCP.     Goals Addressed             This Visit's Progress    To monitor BP at home 3 times weely and record readings       Care Coordination Interventions: Evaluation of current treatment plan related to hypertension self management and patient's adherence to plan as established by provider Review of patient status, including review of consultant's reports, relevant laboratory and other test results, and medications completed Advised patient, providing education and rationale, to monitor blood pressure daily and record, calling PCP for findings outside established parameters Educated patient about stages of Hypertension, educated target BP <130/80 and mailed printed educational materials related to Hypertension Categories; Why Should I Limit Sodium?  Last practice recorded BP readings:  BP Readings from Last 3 Encounters:  10/09/22 (!) 146/88  09/21/22 (!) 150/88  08/31/22 (!) 148/88   Most recent eGFR/CrCl:  Lab Results  Component Value Date   EGFR 74 06/20/2022    No components found for: "CRCL"    Interventions Today    Flowsheet Row Most Recent Value  Chronic Disease   Chronic disease during today's visit Other, Hypertension (HTN)  General Interventions   General Interventions Discussed/Reviewed General Interventions Discussed, General Interventions Reviewed, Doctor Visits  Doctor Visits Discussed/Reviewed Doctor Visits Discussed, Doctor Visits Reviewed, PCP, Specialist  Exercise Interventions   Exercise Discussed/Reviewed Physical Activity,  Exercise Reviewed, Exercise Discussed  Physical Activity Discussed/Reviewed Physical Activity Discussed, Physical Activity Reviewed, Types of exercise, PREP  Education Interventions   Education Provided Provided Education, Provided Printed Education  Provided Verbal Education On When to see the doctor, Exercise, Medication  Nutrition Interventions   Nutrition Discussed/Reviewed Nutrition Discussed, Nutrition Reviewed, Fluid intake  Pharmacy Interventions   Pharmacy Dicussed/Reviewed Pharmacy Topics Discussed, Pharmacy Topics Reviewed, Medications and their functions          SDOH assessments and interventions completed:  No     Care Coordination Interventions:  Yes, provided   Follow up plan: Follow up call scheduled for 12/14/22 @10 :30 AM    Encounter Outcome:  Pt. Visit Completed

## 2022-10-19 NOTE — Patient Outreach (Signed)
  Care Coordination   10/19/2022 Name: Brandi [name] MRN: [id number] DOB: 1955-12-19   Care Coordination Outreach Attempts:  An unsuccessful telephone outreach was attempted for a scheduled appointment today.  Follow Up Plan:  Additional outreach attempts will be made to offer the patient care coordination information and services.   Encounter Outcome:  No Answer   Care Coordination Interventions:  No, not indicated    Delsa Sale, RN, BSN, CCM Care Management Coordinator [facility] Care Management  Direct Phone: [phone]

## 2022-10-19 NOTE — Patient Instructions (Signed)
Visit Information  Thank you for taking time to visit with me today. Please don't hesitate to contact me if I can be of assistance to you.   Following are the goals we discussed today:   Goals Addressed             This Visit's Progress    To monitor BP at home 3 times weely and record readings       Care Coordination Interventions: Evaluation of current treatment plan related to hypertension self management and patient's adherence to plan as established by provider Review of patient status, including review of consultant's reports, relevant laboratory and other test results, and medications completed Advised patient, providing education and rationale, to monitor blood pressure daily and record, calling PCP for findings outside established parameters Educated patient about stages of Hypertension, educated target BP <130/80 and mailed printed educational materials related to Hypertension Categories; Why Should I Limit Sodium?  Last practice recorded BP readings:  BP Readings from Last 3 Encounters:  10/09/22 (!) 146/88  09/21/22 (!) 150/88  08/31/22 (!) 148/88   Most recent eGFR/CrCl:  Lab Results  Component Value Date   EGFR 74 06/20/2022    No components found for: "CRCL"         Our next appointment is by telephone on 12/14/22 at 10:30 AM  Please call the care guide team at [phone] if you need to cancel or reschedule your appointment.   If you are experiencing a Mental Health or Behavioral Health Crisis or need someone to talk to, please call 1-800-273-TALK (toll free, 24 hour hotline)  Patient verbalizes understanding of instructions and care plan provided today and agrees to view in MyChart. Active MyChart status and patient understanding of how to access instructions and care plan via MyChart confirmed with patient.     Delsa Sale, RN, BSN, CCM Care Management Coordinator [facility] Care Management Direct Phone: [phone]

## 2022-11-06 ENCOUNTER — Other Ambulatory Visit (HOSPITAL_BASED_OUTPATIENT_CLINIC_OR_DEPARTMENT_OTHER): Payer: Self-pay

## 2022-11-06 ENCOUNTER — Ambulatory Visit: Payer: Medicare Other | Admitting: Bariatrics

## 2022-11-06 ENCOUNTER — Encounter: Payer: Self-pay | Admitting: Bariatrics

## 2022-11-06 VITALS — BP 138/87 | HR 61 | Temp 97.8°F | Ht 64.0 in | Wt 205.0 lb

## 2022-11-06 DIAGNOSIS — E1169 Type 2 diabetes mellitus with other specified complication: Secondary | ICD-10-CM

## 2022-11-06 DIAGNOSIS — E7849 Other hyperlipidemia: Secondary | ICD-10-CM | POA: Diagnosis not present

## 2022-11-06 DIAGNOSIS — E669 Obesity, unspecified: Secondary | ICD-10-CM

## 2022-11-06 DIAGNOSIS — Z6835 Body mass index (BMI) 35.0-35.9, adult: Secondary | ICD-10-CM | POA: Diagnosis not present

## 2022-11-06 DIAGNOSIS — Z7985 Long-term (current) use of injectable non-insulin antidiabetic drugs: Secondary | ICD-10-CM | POA: Diagnosis not present

## 2022-11-06 MED ORDER — TIRZEPATIDE 10 MG/0.5ML ~~LOC~~ SOAJ
10.0000 mg | SUBCUTANEOUS | 0 refills | Status: DC
  Filled 2022-11-06: qty 2, 28d supply, fill #0

## 2022-11-06 NOTE — Progress Notes (Signed)
WEIGHT SUMMARY AND BIOMETRICS  Weight Lost Since Last Visit: 0lb  Weight Gained Since Last Visit: 5lb   Vitals Temp: 97.8 F (36.6 C) BP: 138/87 Pulse Rate: 61 SpO2: 100 %   Anthropometric Measurements Height: 5\' 4"  (1.626 m) Weight: 205 lb (93 kg) BMI (Calculated): 35.17 Weight at Last Visit: 200lb Weight Lost Since Last Visit: 0lb Weight Gained Since Last Visit: 5lb Starting Weight: 273lb Total Weight Loss (lbs): 68 lb (30.8 kg)   Body Composition  Body Fat %: 51 % Fat Mass (lbs): 105 lbs Muscle Mass (lbs): 95.6 lbs Visceral Fat Rating : 15   Other Clinical Data Fasting: No Labs: No Today's Visit #: 46 Starting Date: 09/04/20    OBESITY Duchess is here to discuss her progress with her obesity treatment plan along with follow-up of her obesity related diagnoses.     Nutrition Plan: the Category 1 plan - 50% adherence.  Current exercise: none  Interim History:  She is up 4 lbs since her last visit. She has been eating more and snacking.  Eating all of the food on the plan., Protein intake is as prescribed, Is not drinking sugar sweetened beverages., Is exceeding snack calorie allotment, Is not skipping meals, Water intake is adequate., Denies polyphagia, and Reports excessive cravings. She has been sick with the Covid and exercising more. [facility] She has been to more celebrations.   Pharmacotherapy: Cate is on Mounjaro 10 mg SQ weekly Adverse side effects: None Hunger is moderately controlled.  Cravings are moderately controlled.   Assessment/Plan:   1. Type 2 diabetes mellitus with other specified complication, without long-term current use of insulin (HCC) Type II Diabetes HgbA1c is at goal. Last A1c was 5.1 CBGs: Not checking      Episodes of hypoglycemia: no Medication(s): Mounjaro 10 mg SQ weekly  Lab Results  Component Value Date   HGBA1C 5.1 06/20/2022   HGBA1C 5.3 01/31/2022   HGBA1C 5.3  09/28/2021   Lab Results  Component Value Date   MICROALBUR 10 03/11/2020   LDLCALC 74 06/20/2022   CREATININE 0.85 06/20/2022   Plan: Continue and refill Mounjaro 10 mg SQ weekly Continue all other medications.  Will keep all carbohydrates low both sweets and starches.  Will continue exercise regimen to 30 to 60 minutes on most days of the week.  Aim for 7 to 9 hours of sleep nightly.  Eat more low glycemic index foods.   2. Other hyperlipidemia Hyperlipidemia LDL is not at goal. Medication(s): Lipitor Cardiovascular risk factors: advanced age (older than 44 for men, 3 for women), diabetes mellitus, dyslipidemia, obesity (BMI >= 30 kg/m2), and sedentary lifestyle  Lab Results  Component Value Date   CHOL 161 06/20/2022   HDL 71 06/20/2022   LDLCALC 74 06/20/2022   TRIG 87 06/20/2022   CHOLHDL 2.3 06/20/2022   Lab Results  Component Value Date   ALT 30 06/20/2022   AST 27 06/20/2022   ALKPHOS 150 (H) 06/20/2022   BILITOT 0.3 06/20/2022   The 10-year ASCVD risk score (Arnett DK, et al., 2019) is: 24.2%   Values used to calculate the score:     Age: 89 years     Sex: Female     Is Non-Hispanic African American: Yes     Diabetic: Yes     Tobacco smoker: No     Systolic Blood Pressure: 138 mmHg     Is BP treated: Yes  HDL Cholesterol: 71 mg/dL     Total Cholesterol: 161 mg/dL  Plan:  Continue statin.  Information sheet on healthy vs unhealthy fats.  Will avoid all trans fats.  Will read labels Will minimize saturated fats except the following: low fat meats in moderation, diary, and limited dark chocolate.  Increase Omega 3 in foods, and consider an Omega 3 supplement.     Generalized Obesity: Current BMI BMI (Calculated): 35.17   Pharmacotherapy Plan Continue and refill  Mounjaro 10 mg SQ weekly  Brizeida is currently in the action stage of change. As such, her goal is to continue with weight loss efforts.  She has agreed to the Category 1  plan.  Exercise goals: Older adults should do exercises that maintain or improve balance if they are at risk of falling.   Behavioral modification strategies: increasing lean protein intake, no meal skipping, meal planning , increase water intake, better snacking choices, planning for success, increasing fiber rich foods, decreasing sodium intake, ways to avoid boredom eating, keep healthy foods in the home, and mindful eating.  Analize has agreed to follow-up with our clinic in 4 weeks.       Objective:   VITALS: Per patient if applicable, see vitals. GENERAL: Alert and in no acute distress. CARDIOPULMONARY: No increased WOB. Speaking in clear sentences.  PSYCH: Pleasant and cooperative. Speech normal rate and rhythm. Affect is appropriate. Insight and judgement are appropriate. Attention is focused, linear, and appropriate.  NEURO: Oriented as arrived to appointment on time with no prompting.   Attestation Statements:   This was prepared with the assistance of Engineer, civil (consulting).  Occasional wrong-word or sound-a-like substitutions may have occurred due to the inherent limitations of voice recognition software.   [name], DO

## 2022-11-07 ENCOUNTER — Other Ambulatory Visit (HOSPITAL_BASED_OUTPATIENT_CLINIC_OR_DEPARTMENT_OTHER): Payer: Self-pay

## 2022-11-13 NOTE — Progress Notes (Signed)
[facility] PREP Weekly Session  Patient Details  Name: Brandi [name] MRN: [id number] Date of Birth: January 20, 1956 Age: 67 y.o. PCP: Arnette Felts, FNP  Vitals:   11/13/22 1343  Weight: 210 lb (95.3 kg)     [facility] Weekly seesion - 11/13/22 1300       [facility] "PREP" Location   [facility] "PREP" Location [facility]      Weekly Session   Topic Discussed Expectations and non-scale victories   Halfway thru program; revisit, restate, review goals; staying positive   Classes attended to date 8             Emmalia Heyboer B Kristina Bertone 11/13/2022, 1:44 PM

## 2022-11-30 ENCOUNTER — Other Ambulatory Visit: Payer: Self-pay | Admitting: Neurology

## 2022-12-04 ENCOUNTER — Ambulatory Visit (INDEPENDENT_AMBULATORY_CARE_PROVIDER_SITE_OTHER): Payer: Medicare Other | Admitting: Bariatrics

## 2022-12-04 ENCOUNTER — Encounter: Payer: Self-pay | Admitting: Bariatrics

## 2022-12-04 ENCOUNTER — Other Ambulatory Visit (HOSPITAL_BASED_OUTPATIENT_CLINIC_OR_DEPARTMENT_OTHER): Payer: Self-pay

## 2022-12-04 VITALS — BP 121/82 | HR 78 | Temp 98.0°F | Ht 64.0 in | Wt 201.0 lb

## 2022-12-04 DIAGNOSIS — I10 Essential (primary) hypertension: Secondary | ICD-10-CM

## 2022-12-04 DIAGNOSIS — E669 Obesity, unspecified: Secondary | ICD-10-CM

## 2022-12-04 DIAGNOSIS — Z7985 Long-term (current) use of injectable non-insulin antidiabetic drugs: Secondary | ICD-10-CM

## 2022-12-04 DIAGNOSIS — E1169 Type 2 diabetes mellitus with other specified complication: Secondary | ICD-10-CM

## 2022-12-04 DIAGNOSIS — Z6834 Body mass index (BMI) 34.0-34.9, adult: Secondary | ICD-10-CM

## 2022-12-04 DIAGNOSIS — G4733 Obstructive sleep apnea (adult) (pediatric): Secondary | ICD-10-CM

## 2022-12-04 MED ORDER — TIRZEPATIDE 10 MG/0.5ML ~~LOC~~ SOAJ
10.0000 mg | SUBCUTANEOUS | 0 refills | Status: DC
  Filled 2022-12-04: qty 2, 28d supply, fill #0

## 2022-12-04 NOTE — Progress Notes (Signed)
WEIGHT SUMMARY AND BIOMETRICS  Weight Lost Since Last Visit: 4lb  Vitals Temp: 98 F (36.7 C) BP: 121/82 Pulse Rate: 78 SpO2: 98 %   Anthropometric Measurements Height: 5\' 4"  (1.626 m) Weight: 201 lb (91.2 kg) BMI (Calculated): 34.48 Weight at Last Visit: 205lb Weight Lost Since Last Visit: 4lb Starting Weight: 273lb Total Weight Loss (lbs): 72 lb (32.7 kg)   Body Composition  Body Fat %: 50.5 % Fat Mass (lbs): 101.6 lbs Muscle Mass (lbs): 94.4 lbs Visceral Fat Rating : 15   Other Clinical Data Fasting: no Labs: no Today's Visit #: 28 Starting Date: 09/04/20    OBESITY Latia is here to discuss her progress with her obesity treatment plan along with follow-up of her obesity related diagnoses.     Nutrition Plan: the Category 1 plan - 80% adherence.  Current exercise:  [facility]  Interim History:  She is down 4 lbs since her last visit. She was up slightly at her last visit, but is now getting back on track. She is back to her better habits.  Eating all of the food on the plan., Protein intake is as prescribed, Not journaling consistently., Meeting calorie goals., Water intake is adequate., and Denies polyphagia  Pharmacotherapy: Omer is on Mounjaro 10 mg SQ weekly Adverse side effects: None Hunger is moderately controlled.  Cravings are moderately controlled.  Assessment/Plan:   Type II Diabetes HgbA1c is at goal. Last A1c was 5.1 CBGs: Not checking      Episodes of hypoglycemia: no Medication(s): Mounjaro 10 mg SQ weekly  Lab Results  Component Value Date   HGBA1C 5.1 06/20/2022   HGBA1C 5.3 01/31/2022   HGBA1C 5.3 09/28/2021   Lab Results  Component Value Date   MICROALBUR 10 03/11/2020   LDLCALC 74 06/20/2022   CREATININE 0.85 06/20/2022   No results found for: "GFR"  Plan: Continue and refill Mounjaro 10 mg SQ weekly Continue all other medications.  Will keep all carbohydrates low both sweets and starches.  Will continue  exercise regimen to 30 to 60 minutes on most days of the week.  Will eat better snacks.  Eat more low glycemic index foods.    Hypertension Hypertension reasonably well controlled.  Medication(s): Amlodipine 5 mg 1 daily , Bystolic 20 mg, Chlorthalidone 50 mg, and Diovan 160 mg  BP Readings from Last 3 Encounters:  12/04/22 121/82  11/06/22 138/87  10/09/22 (!) 146/88   Lab Results  Component Value Date   CREATININE 0.85 06/20/2022   CREATININE 0.99 01/31/2022   CREATININE 0.93 09/28/2021   No results found for: "GFR"  Plan: Continue all antihypertensives at current dosages. No added salt. Will keep sodium content to 1,500 mg or less per day.     Generalized Obesity: Current BMI BMI (Calculated): 34.48   Pharmacotherapy Plan Continue and refill  Mounjaro 10 mg SQ weekly  Shanisha is currently in the action stage of change. As such, her goal is to continue with weight loss efforts.  She has agreed to the Category 1 plan.  Exercise goals: Older adults should follow the adult guidelines. When older adults cannot meet the adult guidelines, they should be as physically active as their abilities and conditions will allow.   Behavioral modification strategies: increasing lean protein intake, decreasing simple carbohydrates , decrease eating out, meal planning , increase water intake, better snacking choices, planning for success, and mindful eating.  Jazzlynne has agreed to follow-up with our clinic in 4 weeks.  Objective:   VITALS: Per patient if applicable, see vitals. GENERAL: Alert and in no acute distress. CARDIOPULMONARY: No increased WOB. Speaking in clear sentences.  PSYCH: Pleasant and cooperative. Speech normal rate and rhythm. Affect is appropriate. Insight and judgement are appropriate. Attention is focused, linear, and appropriate.  NEURO: Oriented as arrived to appointment on time with no prompting.   Attestation Statements:   This was prepared with the  assistance of Engineer, civil (consulting).  Occasional wrong-word or sound-a-like substitutions may have occurred due to the inherent limitations of voice recognition software.[name], DO

## 2022-12-04 NOTE — Progress Notes (Signed)
[facility] PREP Weekly Session  Patient Details  Name: Brandi [name] MRN: [id number] Date of Birth: January 26, 1956 Age: 67 y.o. PCP: Arnette Felts, FNP  Vitals:   12/04/22 1416  Weight: 204 lb (92.5 kg)     [facility] Weekly seesion - 12/04/22 1400       [facility] "PREP" Location   [facility] "PREP" Location [facility]      Weekly Session   Topic Discussed Calorie breakdown   High quality Carbs, proteins and fats, the macronutrients; diff between simple and complex carbs, choosing nutrient dense foods   Minutes exercised this week 100 minutes    Classes attended to date 27             Brandi [name] Brandi [name] 12/04/2022, 2:17 PM

## 2022-12-08 ENCOUNTER — Encounter: Payer: Self-pay | Admitting: Pharmacist

## 2022-12-09 ENCOUNTER — Other Ambulatory Visit: Payer: Self-pay | Admitting: Neurology

## 2022-12-11 NOTE — Progress Notes (Signed)
[facility] PREP Weekly Session  Patient Details  Name: Brandi [name] MRN: [id number] Date of Birth: 07-Sep-1955 Age: 67 y.o. PCP: Arnette Felts, FNP  Vitals:   12/11/22 1413  Weight: 205 lb (93 kg)     [facility] Weekly seesion - 12/11/22 1400       [facility] "PREP" Location   [facility] "PREP[facility] Location [facility]      Weekly Session   Topic Discussed Finding support;Hitting roadblocks   Membership and wellness talk with Weston Brass and Claris Che; review of last 2 weeks of program.   Minutes exercised this week 100 minutes    Classes attended to date 80             Zoeann Mol B Atha Mcbain 12/11/2022, 2:13 PM

## 2022-12-12 ENCOUNTER — Ambulatory Visit: Payer: Medicare Other | Admitting: Nurse Practitioner

## 2022-12-12 ENCOUNTER — Encounter: Payer: Self-pay | Admitting: Nurse Practitioner

## 2022-12-12 VITALS — BP 130/78 | HR 64 | Temp 98.5°F | Ht 64.0 in | Wt 205.4 lb

## 2022-12-12 DIAGNOSIS — G4733 Obstructive sleep apnea (adult) (pediatric): Secondary | ICD-10-CM | POA: Diagnosis not present

## 2022-12-12 DIAGNOSIS — Z9989 Dependence on other enabling machines and devices: Secondary | ICD-10-CM

## 2022-12-12 DIAGNOSIS — E1169 Type 2 diabetes mellitus with other specified complication: Secondary | ICD-10-CM | POA: Diagnosis not present

## 2022-12-12 DIAGNOSIS — I110 Hypertensive heart disease with heart failure: Secondary | ICD-10-CM | POA: Diagnosis not present

## 2022-12-12 DIAGNOSIS — E669 Obesity, unspecified: Secondary | ICD-10-CM | POA: Insufficient documentation

## 2022-12-12 DIAGNOSIS — E559 Vitamin D deficiency, unspecified: Secondary | ICD-10-CM

## 2022-12-12 DIAGNOSIS — E782 Mixed hyperlipidemia: Secondary | ICD-10-CM

## 2022-12-12 DIAGNOSIS — I5030 Unspecified diastolic (congestive) heart failure: Secondary | ICD-10-CM

## 2022-12-12 DIAGNOSIS — I10 Essential (primary) hypertension: Secondary | ICD-10-CM

## 2022-12-12 DIAGNOSIS — Z23 Encounter for immunization: Secondary | ICD-10-CM

## 2022-12-12 DIAGNOSIS — E785 Hyperlipidemia, unspecified: Secondary | ICD-10-CM

## 2022-12-12 HISTORY — DX: Obesity, unspecified: E66.9

## 2022-12-12 NOTE — Assessment & Plan Note (Signed)
Continue going to PREP and eating a healthy diet

## 2022-12-12 NOTE — Assessment & Plan Note (Signed)
Will check vitamin D level and supplement as needed.    Also encouraged to spend 15 minutes in the sun daily.   

## 2022-12-12 NOTE — Assessment & Plan Note (Signed)
Influenza vaccine administered Encouraged to take Tylenol as needed for fever or muscle aches.

## 2022-12-12 NOTE — Assessment & Plan Note (Signed)
Blood pressure is well controlled, continue current medications.

## 2022-12-12 NOTE — Assessment & Plan Note (Signed)
hgbA1c is stable. Continue current medications.

## 2022-12-12 NOTE — Assessment & Plan Note (Signed)
Doing well with good benefit.

## 2022-12-14 ENCOUNTER — Ambulatory Visit: Payer: Self-pay

## 2022-12-14 NOTE — Patient Outreach (Signed)
Care Coordination   12/14/2022 Name: Brandi [name] MRN: [id number] DOB: 1955-09-19   Care Coordination Outreach Attempts:  An unsuccessful telephone outreach was attempted for a scheduled appointment today.  Follow Up Plan:  Additional outreach attempts will be made to offer the patient care coordination information and services.   Encounter Outcome:  No Answer   Care Coordination Interventions:  No, not indicated    Delsa Sale RN BSN CCM [place]  [facility], [facility] Health Nurse Care Coordinator  Direct Dial: [phone] Website: [email or web address]

## 2022-12-18 NOTE — Progress Notes (Signed)
[facility] PREP Weekly Session  Patient Details  Name: Brandi [name] MRN: [id number] Date of Birth: 01-Oct-1955 Age: 67 y.o. PCP: Arnette Felts, FNP  Vitals:   12/18/22 1406  Weight: 206 lb (93.4 kg)     [facility] Weekly seesion - 12/18/22 1400       [facility] "PREP" Location   [facility] "PREP[facility] Location [facility]      Weekly Session   Topic Discussed Other   Fit testing completed, how fit and strong survey completed, final assessment scheduled for Wednesday 9/25   Minutes exercised this week 80 minutes    Classes attended to date 47             Brandi [name] 12/18/2022, 2:08 PM

## 2022-12-20 NOTE — Progress Notes (Signed)
[facility] PREP Evaluation  Patient Details  Name: Brandi [name] MRN: [id number] Date of Birth: 1955-04-15 Age: 67 y.o. PCP: Arnette Felts, FNP  Vitals:   12/20/22 1458  BP: (!) 152/86  Pulse: 63  SpO2: 95%  Weight: 206 lb (93.4 kg)     [facility] Eval - 12/20/22 1500       [facility] "PREP" Location   [facility] "PREP" Location [facility]      Referral    Program Start Date 09/25/22    Program End Date 12/20/22      Measurement   Waist Circumference 48.5 inches    Waist Circumference End Program 46.5 inches    Hip Circumference 50 inches    Hip Circumference End Program 49 inches    Body fat --   E2     Mobility and Daily Activities   I find it easy to walk up or down two or more flights of stairs. 3    I have no trouble taking out the trash. 4    I do housework such as vacuuming and dusting on my own without difficulty. 3    I can easily lift a gallon of milk (8lbs). 4    I can easily walk a mile. 1    I have no trouble reaching into high cupboards or reaching down to pick up something from the floor. 2    I do not have trouble doing out-door work such as Loss adjuster, chartered, raking leaves, or gardening. 3      Mobility and Daily Activities   I feel younger than my age. 4    I feel independent. 4    I feel energetic. 4    I live an active life.  3    I feel strong. 4    I feel healthy. 4    I feel active as other people my age. 4      How fit and strong are you.   Fit and Strong Total Score 47            Past Medical History:  Diagnosis Date   Anemia    Arthritis    Back pain    Class 3 severe obesity with serious comorbidity and body mass index (BMI) of 45.0 to 49.9 in adult [facility]) 02/15/2022   Disequilibrium 06/19/2022   Edema, lower extremity    Food allergy    GERD (gastroesophageal reflux disease)    Hypertension    Hypertensive crisis    Joint pain    Morbid obesity (HCC)    Osteoporosis    Pre-diabetes    Rheumatoid arthritis (HCC)    Sciatica  neuralgia    Sleep apnea    Past Surgical History:  Procedure Laterality Date   ABDOMINAL HYSTERECTOMY     PARTIAL   CARPAL TUNNEL RELEASE Right    02/2022   CHOLECYSTECTOMY     ORIF ELBOW FRACTURE Right    ORIF WRIST FRACTURE Right    PROTESTIC HIPS     PROTESTIC KNEE     Social History   Tobacco Use  Smoking Status Never  Smokeless Tobacco Never  Inches lost: 3 How fit and strong survey: 09/21/22: 27 12/19/23: 47 Education sessions completed: 8 Strength workout sessions completed: 8 Enrolled in PREP EMMI education program for next 12 weeks  Elige Shouse B Chaquetta Schlottman 12/20/2022, 3:02 PM

## 2022-12-22 ENCOUNTER — Telehealth: Payer: Self-pay | Admitting: *Deleted

## 2022-12-22 NOTE — Progress Notes (Signed)
  Care Coordination Note  12/22/2022 Name: Brandi [name] MRN: [id number] DOB: 31-Oct-1955  Brandi [name] is a 67 y.o. year old female who is a primary care patient of Arnette Felts, FNP and is actively engaged with the care management team. I reached out to Conley Rolls by phone today to assist with re-scheduling a follow up visit with the RN Case Manager  Follow up plan: Unsuccessful telephone outreach attempt made. A HIPAA compliant phone message was left for the patient providing contact information and requesting a return call.   [facility]  Care Coordination Care Guide  Direct Dial: [phone]

## 2022-12-22 NOTE — Progress Notes (Signed)
  Care Coordination Note  12/22/2022 Name: Brandi [name] MRN: [id number] DOB: December 12, 1955  Brandi [name] is a 67 y.o. year old female who is a primary care patient of Arnette Felts, FNP and is actively engaged with the care management team. I reached out to Conley Rolls by phone today to assist with re-scheduling a follow up visit with the RN Case Manager  Follow up plan: Telephone appointment with care management team member scheduled for:01/09/23  [facility] Coordination Care Guide  Direct Dial: [phone]

## 2022-12-27 ENCOUNTER — Ambulatory Visit
Admission: RE | Admit: 2022-12-27 | Discharge: 2022-12-27 | Disposition: A | Discharge status: Home / Self Care | Payer: Medicare Other | Source: Ambulatory Visit | Attending: Nurse Practitioner | Admitting: Nurse Practitioner

## 2022-12-27 ENCOUNTER — Other Ambulatory Visit: Payer: Self-pay | Admitting: Nurse Practitioner

## 2022-12-27 DIAGNOSIS — Z1231 Encounter for screening mammogram for malignant neoplasm of breast: Secondary | ICD-10-CM

## 2022-12-29 ENCOUNTER — Other Ambulatory Visit: Payer: Self-pay

## 2022-12-29 ENCOUNTER — Other Ambulatory Visit: Payer: Self-pay | Admitting: Physician Assistant

## 2022-12-29 MED ORDER — NEBIVOLOL HCL 20 MG PO TABS: ORAL_TABLET | ORAL | 0 refills | Status: DC

## 2023-01-01 ENCOUNTER — Encounter: Payer: Self-pay | Admitting: Bariatrics

## 2023-01-01 ENCOUNTER — Ambulatory Visit (INDEPENDENT_AMBULATORY_CARE_PROVIDER_SITE_OTHER): Payer: Medicare Other | Admitting: Bariatrics

## 2023-01-01 ENCOUNTER — Other Ambulatory Visit (HOSPITAL_BASED_OUTPATIENT_CLINIC_OR_DEPARTMENT_OTHER): Payer: Self-pay

## 2023-01-01 VITALS — BP 145/87 | Ht 64.0 in | Wt 198.0 lb

## 2023-01-01 DIAGNOSIS — Z6833 Body mass index (BMI) 33.0-33.9, adult: Secondary | ICD-10-CM | POA: Diagnosis not present

## 2023-01-01 DIAGNOSIS — G4733 Obstructive sleep apnea (adult) (pediatric): Secondary | ICD-10-CM | POA: Diagnosis not present

## 2023-01-01 DIAGNOSIS — Z7985 Long-term (current) use of injectable non-insulin antidiabetic drugs: Secondary | ICD-10-CM

## 2023-01-01 DIAGNOSIS — E669 Obesity, unspecified: Secondary | ICD-10-CM | POA: Diagnosis not present

## 2023-01-01 DIAGNOSIS — E6609 Other obesity due to excess calories: Secondary | ICD-10-CM

## 2023-01-01 DIAGNOSIS — E1169 Type 2 diabetes mellitus with other specified complication: Secondary | ICD-10-CM | POA: Diagnosis not present

## 2023-01-01 MED ORDER — TIRZEPATIDE 10 MG/0.5ML ~~LOC~~ SOAJ
10.0000 mg | SUBCUTANEOUS | 0 refills | Status: DC
  Filled 2023-01-01: qty 2, 28d supply, fill #0

## 2023-01-01 NOTE — Progress Notes (Signed)
WEIGHT SUMMARY AND BIOMETRICS  Weight Lost Since Last Visit: 3lb  Weight Gained Since Last Visit: 0   No data recorded Anthropometric Measurements Height: 5\' 4"  (1.626 m) Weight: 198 lb (89.8 kg) BMI (Calculated): 33.97 Weight at Last Visit: 201lb Weight Lost Since Last Visit: 3lb Weight Gained Since Last Visit: 0 Starting Weight: 273lb Total Weight Loss (lbs): 75 lb (34 kg)   Body Composition  Body Fat %: 48.5 % Fat Mass (lbs): 96.2 lbs Muscle Mass (lbs): 96.8 lbs Visceral Fat Rating : 14   Other Clinical Data Fasting: no Labs: no Today's Visit #: 96 Starting Date: 09/04/20    OBESITY Brandi [name] is here to discuss her progress with her obesity treatment plan along with follow-up of her obesity related diagnoses.     Nutrition Plan: the Category 1 plan - 100% adherence.  Current exercise:  She goes to the [facility]  Interim History:  She is down 3 lbs since her last visit.  Eating all of the food on the plan., Is skipping meals, Meeting protein goals., and Water intake is adequate.  Pharmacotherapy: Brandi [name] is on Mounjaro 10 mg SQ weekly Adverse side effects: None Hunger is moderately controlled.  Cravings are moderately controlled.  Assessment/Plan:    Type II Diabetes HgbA1c is at goal. Last A1c was 5.2 CBGs: Fasting 83-103       Episodes of hypoglycemia: no Medication(s): Mounjaro 10 mg SQ weekly  Lab Results  Component Value Date   HGBA1C 5.2 12/12/2022   HGBA1C 5.1 06/20/2022   HGBA1C 5.3 01/31/2022   Lab Results  Component Value Date   MICROALBUR 10 03/11/2020   LDLCALC 74 12/12/2022   CREATININE 0.88 12/12/2022   No results found for: "GFR"  Obstructive Sleep Apnea Brandi [name] has a diagnosis of sleep apnea. She reports that she is using a CPAP regularly. Reports restful sleep.   Plan: Continue CPAP therapy. Continue to practice good sleep hygiene.  Will add in elements of an antiinflammatory diet and  add in elements of a  Mediterranean diet.  Reduce intake of carbohydrates for weight loss.       Plan: Continue and refill Mounjaro 10 mg SQ weekly Continue all other medications.  Will keep all carbohydrates low both sweets and starches.  Will continue exercise regimen to 30 to 60 minutes on most days of the week. She is going to get a coach for exercise.  Aim for 7 to 9 hours of sleep nightly.  Eat more low glycemic index foods.     Generalized Obesity: Current BMI BMI (Calculated): 33.97   Pharmacotherapy Plan Continue and refill  Mounjaro 10 mg SQ weekly  Brandi [name] is currently in the action stage of change. As such, her goal is to continue with weight loss efforts.  She has agreed to the Category 1 plan.  Exercise goals: Older adults should do exercises that maintain or improve balance if they are at risk of falling.   Behavioral modification strategies: increasing lean protein intake, no meal skipping, meal planning , increase water intake, better snacking choices, planning for success, and mindful eating.  Brandi [name] has agreed to follow-up with our clinic in 4 weeks.     Objective:   VITALS: Per patient if applicable, see vitals. GENERAL: Alert and in no acute distress. CARDIOPULMONARY: No increased WOB. Speaking in clear sentences.  PSYCH: Pleasant and cooperative. Speech normal rate and rhythm. Affect is appropriate. Insight and judgement are appropriate. Attention is focused, linear, and appropriate.  NEURO: Oriented as  arrived to appointment on time with no prompting.   Attestation Statements:  This was prepared with the assistance of Engineer, civil (consulting).  Occasional wrong-word or sound-a-like substitutions may have occurred due to the inherent limitations of voice recognition software.   [name], DO

## 2023-01-09 ENCOUNTER — Ambulatory Visit: Payer: Self-pay

## 2023-01-10 NOTE — Patient Instructions (Signed)
Visit Information  Thank you for taking time to visit with me today. Please don't hesitate to contact me if I can be of assistance to you.   Following are the goals we discussed today:   Goals Addressed               This Visit's Progress     Patient Stated     COMPLETED: I am having more sciatica pain (pt-stated)        Care Coordination Interventions: Reviewed provider established plan for pain management Determined [facility] Discussed she continues to exercise routinely and is experiencing no pain at present, she denies having recurrent falls Instructed patient to notify her doctor of new symptoms or concerns Positive reinforcement given to patient for making efforts to improve her overall health       Other     To monitor BP at home 3 times weely and record readings   On track     Care Coordination Interventions: Evaluation of current treatment plan related to hypertension self management and patient's adherence to plan as established by provider Review of patient status, including review of consultant's reports, relevant laboratory and other test results, and medications completed Determined patient continues to self monitor her BP at home, she reports having mostly lower BP readings but admits the numbers are slightly elevated at times Advised patient, providing education and rationale, to monitor blood pressure daily and record, calling PCP for findings outside established parameters Reviewed and discussed with patient she is scheduled to establish with Ronie Spies PA-C, Cardiologist for further evaluation on 04/03/23 @11 :20 AM Last practice recorded BP readings:  BP Readings from Last 3 Encounters:  01/01/23 (!) 145/87  12/20/22 (!) 152/86  12/12/22 130/78   Most recent eGFR/CrCl:  Lab Results  Component Value Date   EGFR 71 12/12/2022    No components found for: "CRCL"        Our next appointment is by telephone on  04/18/23 at 10:30 AM  Please call the care guide team at [phone] if you need to cancel or reschedule your appointment.   If you are experiencing a Mental Health or Behavioral Health Crisis or need someone to talk to, please call 1-800-273-TALK (toll free, 24 hour hotline)  Patient verbalizes understanding of instructions and care plan provided today and agrees to view in MyChart. Active MyChart status and patient understanding of how to access instructions and care plan via MyChart confirmed with patient.     Delsa Sale RN BSN CCM [place]  [facility], [facility] Nurse Care Coordinator  Direct Dial: [phone] Website: [email or web address]

## 2023-01-10 NOTE — Patient Outreach (Signed)
  Care Coordination   Follow Up Visit Note   01/10/2023 Name: [name] MRN: [id number] DOB: [birth year]  Brandi [name] is a 67 y.o. year old female who sees Arnette Felts, FNP for primary care. I spoke with  Conley Rolls by phone today.  What matters to the patients health and wellness today?  Patient would like to continue to stay healthy and maintain her BP within normal range.    Goals Addressed               This Visit's Progress     Patient Stated     COMPLETED: I am having more sciatica pain (pt-stated)        Care Coordination Interventions: Reviewed provider established plan for pain management Determined [facility] Discussed she continues to exercise routinely and is experiencing no pain at present, she denies having recurrent falls Instructed patient to notify her doctor of new symptoms or concerns Positive reinforcement given to patient for making efforts to improve her overall health       Other     To monitor BP at home 3 times weely and record readings   On track     Care Coordination Interventions: Evaluation of current treatment plan related to hypertension self management and patient's adherence to plan as established by provider Review of patient status, including review of consultant's reports, relevant laboratory and other test results, and medications completed Determined patient continues to self monitor her BP at home, she reports having mostly lower BP readings but admits the numbers are slightly elevated at times Advised patient, providing education and rationale, to monitor blood pressure daily and record, calling PCP for findings outside established parameters Reviewed and discussed with patient she is scheduled to establish with Ronie Spies PA-C, Cardiologist for further evaluation on 04/03/23 @11 :20 AM Last practice recorded BP readings:  BP Readings from Last 3 Encounters:  01/01/23 (!) 145/87   12/20/22 (!) 152/86  12/12/22 130/78   Most recent eGFR/CrCl:  Lab Results  Component Value Date   EGFR 71 12/12/2022    No components found for: "CRCL"    Interventions Today    Flowsheet Row Most Recent Value  Chronic Disease   Chronic disease during today's visit Hypertension (HTN)  General Interventions   General Interventions Discussed/Reviewed General Interventions Discussed, General Interventions Reviewed, Doctor Visits, Labs  Doctor Visits Discussed/Reviewed Doctor Visits Discussed, Doctor Visits Reviewed, PCP, Specialist  Exercise Interventions   Exercise Discussed/Reviewed Physical Activity, Exercise Reviewed, Exercise Discussed  Physical Activity Discussed/Reviewed Physical Activity Reviewed, Physical Activity Discussed, PREP, Types of exercise  Education Interventions   Education Provided Provided Education  Provided Verbal Education On Nutrition, Labs, Medication, Exercise, When to see the doctor  Labs Reviewed Kidney Function, Lipid Profile, Hgb A1c  Nutrition Interventions   Nutrition Discussed/Reviewed Nutrition Discussed, Nutrition Reviewed, Fluid intake, Decreasing salt  Pharmacy Interventions   Pharmacy Dicussed/Reviewed Pharmacy Topics Discussed, Pharmacy Topics Reviewed, Medications and their functions  Safety Interventions   Safety Discussed/Reviewed Safety Discussed, Safety Reviewed, Fall Risk, Home Safety  Home Safety Assistive Devices          SDOH assessments and interventions completed:  No     Care Coordination Interventions:  Yes, provided   Follow up plan: Follow up call scheduled for 04/18/23 @10 :30 AM    Encounter Outcome:  Patient Visit Completed

## 2023-01-16 ENCOUNTER — Ambulatory Visit (INDEPENDENT_AMBULATORY_CARE_PROVIDER_SITE_OTHER): Payer: Medicare Other

## 2023-01-16 VITALS — BP 134/82 | HR 79 | Temp 98.1°F | Ht 64.0 in | Wt 198.0 lb

## 2023-01-16 DIAGNOSIS — Z23 Encounter for immunization: Secondary | ICD-10-CM | POA: Diagnosis not present

## 2023-01-16 NOTE — Addendum Note (Signed)
Addended by: Randa Lynn T on: 01/16/2023 05:17 PM   Modules accepted: Orders

## 2023-01-16 NOTE — Progress Notes (Signed)
Patient presents today for covid vaccine.

## 2023-01-16 NOTE — Patient Instructions (Signed)

## 2023-01-30 ENCOUNTER — Other Ambulatory Visit (HOSPITAL_BASED_OUTPATIENT_CLINIC_OR_DEPARTMENT_OTHER): Payer: Self-pay

## 2023-01-30 ENCOUNTER — Ambulatory Visit (INDEPENDENT_AMBULATORY_CARE_PROVIDER_SITE_OTHER): Payer: Medicare Other | Admitting: Bariatrics

## 2023-01-30 ENCOUNTER — Encounter: Payer: Self-pay | Admitting: Bariatrics

## 2023-01-30 VITALS — BP 133/77 | HR 62 | Temp 98.1°F | Ht 64.0 in | Wt 210.0 lb

## 2023-01-30 DIAGNOSIS — I10 Essential (primary) hypertension: Secondary | ICD-10-CM

## 2023-01-30 DIAGNOSIS — Z7985 Long-term (current) use of injectable non-insulin antidiabetic drugs: Secondary | ICD-10-CM

## 2023-01-30 DIAGNOSIS — E1169 Type 2 diabetes mellitus with other specified complication: Secondary | ICD-10-CM

## 2023-01-30 DIAGNOSIS — E66812 Obesity, class 2: Secondary | ICD-10-CM

## 2023-01-30 DIAGNOSIS — Z6836 Body mass index (BMI) 36.0-36.9, adult: Secondary | ICD-10-CM | POA: Diagnosis not present

## 2023-01-30 MED ORDER — TIRZEPATIDE 10 MG/0.5ML ~~LOC~~ SOAJ
10.0000 mg | SUBCUTANEOUS | 0 refills | Status: DC
  Filled 2023-01-30: qty 2, 28d supply, fill #0

## 2023-01-30 NOTE — Progress Notes (Signed)
WEIGHT SUMMARY AND BIOMETRICS  Weight Lost Since Last Visit: 0  Weight Gained Since Last Visit: 12lb   Vitals Temp: 98.1 F (36.7 C) BP: 133/77 Pulse Rate: 62 SpO2: 100 %   Anthropometric Measurements Height: 5\' 4"  (1.626 m) Weight: 210 lb (95.3 kg) BMI (Calculated): 36.03 Weight at Last Visit: 198lb Weight Lost Since Last Visit: 0 Weight Gained Since Last Visit: 12lb Starting Weight: 273lb Total Weight Loss (lbs): 63 lb (28.6 kg)   Body Composition  Body Fat %: 52 % Fat Mass (lbs): 109.6 lbs Muscle Mass (lbs): 96 lbs Visceral Fat Rating : 16   Other Clinical Data Fasting: no Labs: no Today's Visit #: 30 Starting Date: 09/04/20    OBESITY Brandi [name] is here to discuss her progress with her obesity treatment plan along with follow-up of her obesity related diagnoses.     Nutrition Plan: the Category 1 plan - 20% adherence.  Current exercise: swimming and walking  Interim History:  She is up 12 lbs since her last visit. She has been on vacation since her last visit. She has been off her plan and only eating about 12 % on plan.  Eating all of the food on the plan., Protein intake is as prescribed, Is not skipping meals, and Water intake is adequate.  Pharmacotherapy: Brandi [name] is on Mounjaro 10 mg SQ weekly Adverse side effects: None Hunger is moderately controlled.  Cravings are moderately controlled.  Assessment/Plan:  Type II Diabetes HgbA1c is at goal. Last A1c was 5.2 CBGs: Not checking      Episodes of hypoglycemia: no Medication(s): Mounjaro 10 mg SQ weekly  Lab Results  Component Value Date   HGBA1C 5.2 12/12/2022   HGBA1C 5.1 06/20/2022   HGBA1C 5.3 01/31/2022   Lab Results  Component Value Date   MICROALBUR 10 03/11/2020   LDLCALC 74 12/12/2022   CREATININE 0.88 12/12/2022    Plan: Continue and refill Mounjaro 10 mg SQ weekly Continue all other medications.  Will keep all carbohydrates low both sweets and starches. She will  decrease her bread.  Will continue exercise regimen to 30 to 60 minutes on most days of the week. She is going to increase her exercise and some cardio. [facility].  Aim for 7 to 9 hours of sleep nightly.  Eat more low glycemic index foods.  She will follow her plan 80 to 90 %.    Hypertension Hypertension stable.  Medication(s): Amlodipine 5 mg 1 daily   BP Readings from Last 3 Encounters:  01/30/23 133/77  01/16/23 134/82  01/01/23 (!) 145/87   Lab Results  Component Value Date   CREATININE 0.88 12/12/2022   CREATININE 0.85 06/20/2022   CREATININE 0.99 01/31/2022    Plan: Continue all antihypertensives at current dosages. No added salt. Will keep sodium content to 1,500 mg or less per day.     Morbid Obesity: Current BMI BMI (Calculated): 36.03   Pharmacotherapy Plan Continue and refill  Mounjaro 10 mg SQ weekly  Brandi [name] is currently in the action stage of change. As such, her goal is to continue with weight loss efforts.  She has agreed to the Category 1 plan.  Exercise goals: Older adults should do exercises that maintain or improve balance if they are at risk of falling.   Behavioral modification strategies: increasing lean protein intake, no meal skipping, meal planning , better snacking choices, planning for success, increasing vegetables, increasing fiber rich foods, and avoiding  temptations.  Brandi [name] has agreed to follow-up with our clinic in 4 weeks.      Objective:   VITALS: Per patient if applicable, see vitals. GENERAL: Alert and in no acute distress. CARDIOPULMONARY: No increased WOB. Speaking in clear sentences.  PSYCH: Pleasant and cooperative. Speech normal rate and rhythm. Affect is appropriate. Insight and judgement are appropriate. Attention is focused, linear, and appropriate.  NEURO: Oriented as arrived to appointment on time with no prompting.   Attestation Statements:   Obesity Behavioral Intervention:  Approximately 10 minutes were spent on the discussion below. ASK: We discussed the diagnosis of obesity with patient today and they agreed to give [name] permission to discuss obesity behavioral modification therapy. ASSESS: Patient has the diagnosis of obesity and BMI today is 36. Patient is in the action stage of change. ADVISE: The patient was educated on the multiple health risks of obesity as well as the benefit of weight loss to improve health. Patient was advised of the need for long term treatment and the importance of lifestyle modifications to improve current health and to decrease risk of future health problems. AGREE: Multiple dietary modification options and treatment options were discussed, and the patient agreed to follow the recommendations documented in the above note. ARRANGE: The patient was educated on the importance of frequent visits to treat obesity as outlined per OMA, CMS, and USPSTF guidelines and agreed to schedule the next follow up appointment today   This was prepared with the assistance of Engineer, civil (consulting).  Occasional wrong-word or sound-a-like substitutions may have occurred due to the inherent limitations of voice recognition software.   [name], DO

## 2023-02-26 ENCOUNTER — Other Ambulatory Visit (HOSPITAL_BASED_OUTPATIENT_CLINIC_OR_DEPARTMENT_OTHER): Payer: Self-pay

## 2023-02-26 ENCOUNTER — Ambulatory Visit (INDEPENDENT_AMBULATORY_CARE_PROVIDER_SITE_OTHER): Payer: Medicare Other | Admitting: Bariatrics

## 2023-02-26 ENCOUNTER — Encounter: Payer: Self-pay | Admitting: Bariatrics

## 2023-02-26 VITALS — BP 138/84 | HR 64 | Temp 97.5°F | Ht 64.0 in | Wt 207.0 lb

## 2023-02-26 DIAGNOSIS — E669 Obesity, unspecified: Secondary | ICD-10-CM | POA: Diagnosis not present

## 2023-02-26 DIAGNOSIS — E1169 Type 2 diabetes mellitus with other specified complication: Secondary | ICD-10-CM

## 2023-02-26 DIAGNOSIS — Z7985 Long-term (current) use of injectable non-insulin antidiabetic drugs: Secondary | ICD-10-CM | POA: Diagnosis not present

## 2023-02-26 DIAGNOSIS — E7849 Other hyperlipidemia: Secondary | ICD-10-CM

## 2023-02-26 DIAGNOSIS — Z6835 Body mass index (BMI) 35.0-35.9, adult: Secondary | ICD-10-CM | POA: Diagnosis not present

## 2023-02-26 DIAGNOSIS — E785 Hyperlipidemia, unspecified: Secondary | ICD-10-CM | POA: Diagnosis not present

## 2023-02-26 MED ORDER — TIRZEPATIDE 10 MG/0.5ML ~~LOC~~ SOAJ
10.0000 mg | SUBCUTANEOUS | 0 refills | Status: DC
  Filled 2023-02-26: qty 2, 28d supply, fill #0

## 2023-02-26 NOTE — Progress Notes (Signed)
WEIGHT SUMMARY AND BIOMETRICS  Weight Lost Since Last Visit: 3lb  Weight Gained Since Last Visit: 0   Vitals Temp: (!) 97.5 F (36.4 C) BP: 138/84 Pulse Rate: 64 SpO2: 98 %   Anthropometric Measurements Height: 5\' 4"  (1.626 m) Weight: 207 lb (93.9 kg) BMI (Calculated): 35.51 Weight at Last Visit: 210lb Weight Lost Since Last Visit: 3lb Weight Gained Since Last Visit: 0 Starting Weight: 273lb Total Weight Loss (lbs): 66 lb (29.9 kg)   Body Composition  Body Fat %: 51.2 % Fat Mass (lbs): 106.4 lbs Muscle Mass (lbs): 96.2 lbs Visceral Fat Rating : 16   Other Clinical Data Fasting: no Labs: no Today's Visit #: 31 Starting Date: 09/04/20    OBESITY Brandi [name] is here to discuss her progress with her obesity treatment plan along with follow-up of her obesity related diagnoses.    Nutrition Plan: the Category 1 plan - 70% adherence.  Current exercise: walking  Interim History:  She is down 3 lbs since her last visit.  Eating all of the food on the plan., Protein intake is as prescribed, Is not skipping meals, Meeting protein goals., and Denies polyphagia   Pharmacotherapy: [name] is on Mounjaro 10 mg SQ weekly Adverse side effects: Constipation. She is eating her greens.  Hunger is moderately controlled.  Cravings are moderately controlled.  Assessment/Plan:   Type II Diabetes HgbA1c is at goal. Last A1c was 5.2 CBGs: Fasting 80 to 90, and her lowest is 77.        Episodes of hypoglycemia: no Medication(s): Mounjaro 10 mg SQ weekly  Lab Results  Component Value Date   HGBA1C 5.2 12/12/2022   HGBA1C 5.1 06/20/2022   HGBA1C 5.3 01/31/2022   Lab Results  Component Value Date   MICROALBUR 10 03/11/2020   LDLCALC 74 12/12/2022   CREATININE 0.88 12/12/2022    Plan: Continue and refill Mounjaro 10 mg SQ weekly Discussed healthy snacks.   Continue all other medications.  Will keep all carbohydrates low both sweets and starches.  Will continue exercise regimen to 30 to 60 minutes on most days of the week.  Aim for 7 to 9 hours of sleep nightly.  Eat more low glycemic index foods.   Hyperlipidemia LDL is not at goal. Medication(s): Lipitor  Cardiovascular risk factors: advanced age (older than 55 for men, 66 for women), diabetes mellitus, dyslipidemia, hypertension, obesity (BMI >= 30 kg/m2), and sedentary lifestyle  Lab Results  Component Value Date   CHOL 157 12/12/2022   HDL 71 12/12/2022   LDLCALC 74 12/12/2022   TRIG 61 12/12/2022   CHOLHDL 2.2 12/12/2022   Lab Results  Component Value Date   ALT 30 06/20/2022   AST 27 06/20/2022   ALKPHOS 150 (H) 06/20/2022   BILITOT 0.3 06/20/2022   The 10-year ASCVD risk score (Arnett DK, et al., 2019) is: 23.7%  Values used to calculate the score:     Age: 79 years     Sex: Female     Is Non-Hispanic African American: Yes     Diabetic: Yes     Tobacco smoker: No     Systolic Blood Pressure: 138 mmHg     Is BP treated: Yes     HDL Cholesterol: 71 mg/dL     Total Cholesterol: 157 mg/dL  Plan:  Continue statin.  Discussed meal planning and healthy food options.  Will avoid all trans fats.  Will read labels Will minimize saturated fats except the following: low fat meats in moderation, diary, and limited dark chocolate.  She will continue exercise.      Generalized Obesity: Current BMI BMI (Calculated): 35.51   Pharmacotherapy Plan Continue and refill  Mounjaro 10 mg SQ weekly  [name] is currently in the action stage of change. As such, her goal is to continue with weight loss efforts.  She has agreed to the Category 1 plan.  Exercise goals: Older adults should determine their level of effort for physical activity relative to their level of fitness.   Behavioral modification strategies: increasing lean protein intake, meal planning , increase water intake,  better snacking choices, decrease snacking , avoiding temptations, work on smaller portions, and mindful eating.  [name] has agreed to follow-up with our clinic in 4 weeks.     Objective:   VITALS: Per patient if applicable, see vitals. GENERAL: Alert and in no acute distress. CARDIOPULMONARY: No increased WOB. Speaking in clear sentences.  PSYCH: Pleasant and cooperative. Speech normal rate and rhythm. Affect is appropriate. Insight and judgement are appropriate. Attention is focused, linear, and appropriate.  NEURO: Oriented as arrived to appointment on time with no prompting.   Attestation Statements:   This was prepared with the assistance of Engineer, civil (consulting).  Occasional wrong-word or sound-a-like substitutions may have occurred due to the inherent limitations of voice recognition   [name], DO

## 2023-02-27 ENCOUNTER — Ambulatory Visit: Payer: Medicare Other | Admitting: Bariatrics

## 2023-03-06 ENCOUNTER — Other Ambulatory Visit: Payer: Self-pay | Admitting: Physician Assistant

## 2023-03-06 ENCOUNTER — Other Ambulatory Visit: Payer: Self-pay

## 2023-03-06 MED ORDER — ATORVASTATIN CALCIUM 20 MG PO TABS: 20.0000 mg | ORAL_TABLET | Freq: Every day | ORAL | 0 refills | Status: DC

## 2023-03-09 ENCOUNTER — Other Ambulatory Visit: Payer: Self-pay | Admitting: Physician Assistant

## 2023-03-12 ENCOUNTER — Other Ambulatory Visit: Payer: Self-pay | Admitting: Neurology

## 2023-03-12 ENCOUNTER — Other Ambulatory Visit: Payer: Self-pay | Admitting: Physician Assistant

## 2023-03-15 ENCOUNTER — Other Ambulatory Visit: Payer: Self-pay

## 2023-03-15 DIAGNOSIS — I10 Essential (primary) hypertension: Secondary | ICD-10-CM

## 2023-03-15 MED ORDER — VALSARTAN 160 MG PO TABS: 160.0000 mg | ORAL_TABLET | Freq: Every day | ORAL | 0 refills | Status: DC

## 2023-03-19 ENCOUNTER — Encounter: Payer: Self-pay | Admitting: Nurse Practitioner

## 2023-03-19 NOTE — Telephone Encounter (Signed)
Last seen on 03/08/22 Follow up scheduled

## 2023-03-19 NOTE — Telephone Encounter (Signed)
 Care team updated and letter sent for eye exam notes.

## 2023-03-29 ENCOUNTER — Other Ambulatory Visit: Payer: Self-pay

## 2023-03-29 DIAGNOSIS — I10 Essential (primary) hypertension: Secondary | ICD-10-CM

## 2023-03-29 MED ORDER — AMLODIPINE BESYLATE 5 MG PO TABS: 7.5000 mg | ORAL_TABLET | Freq: Every day | ORAL | 0 refills | Status: DC

## 2023-04-02 ENCOUNTER — Encounter: Payer: Self-pay | Admitting: Bariatrics

## 2023-04-02 ENCOUNTER — Ambulatory Visit (INDEPENDENT_AMBULATORY_CARE_PROVIDER_SITE_OTHER): Payer: Medicare Other | Admitting: Bariatrics

## 2023-04-02 ENCOUNTER — Other Ambulatory Visit (HOSPITAL_BASED_OUTPATIENT_CLINIC_OR_DEPARTMENT_OTHER): Payer: Self-pay

## 2023-04-02 VITALS — BP 137/82 | HR 70 | Temp 97.9°F | Ht 64.0 in | Wt 212.0 lb

## 2023-04-02 DIAGNOSIS — Z7985 Long-term (current) use of injectable non-insulin antidiabetic drugs: Secondary | ICD-10-CM

## 2023-04-02 DIAGNOSIS — E119 Type 2 diabetes mellitus without complications: Secondary | ICD-10-CM

## 2023-04-02 DIAGNOSIS — Z6836 Body mass index (BMI) 36.0-36.9, adult: Secondary | ICD-10-CM

## 2023-04-02 DIAGNOSIS — E669 Obesity, unspecified: Secondary | ICD-10-CM | POA: Diagnosis not present

## 2023-04-02 DIAGNOSIS — E1169 Type 2 diabetes mellitus with other specified complication: Secondary | ICD-10-CM

## 2023-04-02 DIAGNOSIS — I10 Essential (primary) hypertension: Secondary | ICD-10-CM | POA: Diagnosis not present

## 2023-04-02 DIAGNOSIS — E66812 Obesity, class 2: Secondary | ICD-10-CM

## 2023-04-02 MED ORDER — TIRZEPATIDE 12.5 MG/0.5ML ~~LOC~~ SOAJ
12.5000 mg | SUBCUTANEOUS | 0 refills | Status: DC
  Filled 2023-04-02: qty 2, 28d supply, fill #0

## 2023-04-02 NOTE — Progress Notes (Signed)
 WEIGHT SUMMARY AND BIOMETRICS  Weight Lost Since Last Visit: 0  Weight Gained Since Last Visit: 5lb   Vitals Temp: 97.9 F (36.6 C) BP: 137/82 Pulse Rate: 70 SpO2: 98 %   Anthropometric Measurements Height: 5' 4 (1.626 m) Weight: 212 lb (96.2 kg) BMI (Calculated): 36.37 Weight at Last Visit: 207lb Weight Lost Since Last Visit: 0 Weight Gained Since Last Visit: 5lb Starting Weight: 273lb Total Weight Loss (lbs): 61 lb (27.7 kg)   Body Composition  Body Fat %: 51 % Fat Mass (lbs): 108.4 lbs Muscle Mass (lbs): 99 lbs Visceral Fat Rating : 16   Other Clinical Data Fasting: no Labs: no Today's Visit #: 32 Starting Date: 09/04/20    OBESITY Brandi [name] is here to discuss her progress with her obesity treatment plan along with follow-up of her obesity related diagnoses.    Nutrition Plan: the Category 1 plan - 50% adherence.  Current exercise: none  Interim History:  She is up 5 lbs since her last visit before the holidays. She is going to get on back on track.  Eating all of the food on the plan., Protein intake is as prescribed, Is exceeding snack calorie allotment, Is not skipping meals, and Water intake is adequate.   Pharmacotherapy: Brandi [name] is on Mounjaro  10 mg SQ weekly Adverse side effects: None Hunger is moderately controlled.  Cravings are moderately controlled.  Assessment/Plan:   Type II Diabetes HgbA1c is at goal. Last A1c was 5.2 Episodes of hypoglycemia: no Medication(s): Mounjaro  10 mg SQ weekly  Lab Results  Component Value Date   HGBA1C 5.2 12/12/2022   HGBA1C 5.1 06/20/2022   HGBA1C 5.3 01/31/2022   Lab Results  Component Value Date   MICROALBUR 10 03/11/2020   LDLCALC 74 12/12/2022   CREATININE 0.88 12/12/2022   No results found for: GFR  Plan:  Will increase her Mounjaro  to 12.5 mg weekly. Rx for 1 month with no  refills.  Will keep all carbohydrates low both sweets and starches.  Will continue exercise regimen to 30 to 60 minutes on most days of the week.  Aim for 7 to 9 hours of sleep nightly.  Eat more low glycemic index foods.  She will keep her salt intake low.  She will keep her protein intake high.   Hypertension Hypertension reasonably well controlled.  Medication(s): Amlodipine  5 mg 1 daily  and Diovan  160 mg, and Nebivolol  20 mg  BP Readings from Last 3 Encounters:  04/02/23 137/82  02/26/23 138/84  01/30/23 133/77   Lab Results  Component Value Date   CREATININE 0.88 12/12/2022   CREATININE 0.85 06/20/2022   CREATININE 0.99 01/31/2022   No results found for: GFR  Plan: Continue all antihypertensives at current dosages. No added salt. Will keep sodium content to 1,500 mg or less per day.     Generalized Obesity: Current BMI BMI (  Calculated): 36.37   Pharmacotherapy Plan Continue and increase dose  Mounjaro  15 mg SQ weekly  Brandi [name] is currently in the action stage of change. As such, her goal is to continue with weight loss efforts.  She has agreed to the Category 1 plan.  Exercise goals: Older adults should determine their level of effort for physical activity relative to their level of fitness.  She will resume her water aerobics.   Behavioral modification strategies: increasing lean protein intake, decreasing simple carbohydrates , meal planning , increase water intake, better snacking choices, planning for success, increasing vegetables, decreasing sodium intake, decrease snacking , ways to avoid night time snacking, keep healthy foods in the home, measure portion sizes, and mindful eating.  Brandi [name] has agreed to follow-up with our clinic in 4 weeks.      Objective:   VITALS: Per patient if applicable, see vitals. GENERAL: Alert and in no acute distress. CARDIOPULMONARY: No increased WOB. Speaking in clear sentences.  PSYCH: Pleasant and cooperative. Speech normal  rate and rhythm. Affect is appropriate. Insight and judgement are appropriate. Attention is focused, linear, and appropriate.  NEURO: Oriented as arrived to appointment on time with no prompting.   Attestation Statements:    This was prepared with the assistance of Engineer, Civil (consulting).  Occasional wrong-word or sound-a-like substitutions may have occurred due to the inherent limitations of voice recognition   Clayborne Daring, DO

## 2023-04-03 ENCOUNTER — Ambulatory Visit: Payer: Medicare Other | Admitting: Physician Assistant

## 2023-04-06 ENCOUNTER — Other Ambulatory Visit (HOSPITAL_BASED_OUTPATIENT_CLINIC_OR_DEPARTMENT_OTHER): Payer: Self-pay

## 2023-04-16 ENCOUNTER — Ambulatory Visit: Payer: Medicare Other | Admitting: Nurse Practitioner

## 2023-04-16 VITALS — BP 130/80 | HR 76 | Temp 98.9°F | Ht 64.0 in | Wt 208.4 lb

## 2023-04-16 DIAGNOSIS — Z2821 Immunization not carried out because of patient refusal: Secondary | ICD-10-CM | POA: Diagnosis not present

## 2023-04-16 DIAGNOSIS — E785 Hyperlipidemia, unspecified: Secondary | ICD-10-CM

## 2023-04-16 DIAGNOSIS — E66812 Obesity, class 2: Secondary | ICD-10-CM

## 2023-04-16 DIAGNOSIS — E1169 Type 2 diabetes mellitus with other specified complication: Secondary | ICD-10-CM | POA: Diagnosis not present

## 2023-04-16 DIAGNOSIS — Z6835 Body mass index (BMI) 35.0-35.9, adult: Secondary | ICD-10-CM

## 2023-04-16 DIAGNOSIS — M5432 Sciatica, left side: Secondary | ICD-10-CM | POA: Diagnosis not present

## 2023-04-16 DIAGNOSIS — I5030 Unspecified diastolic (congestive) heart failure: Secondary | ICD-10-CM | POA: Diagnosis not present

## 2023-04-16 DIAGNOSIS — E6609 Other obesity due to excess calories: Secondary | ICD-10-CM

## 2023-04-16 DIAGNOSIS — I110 Hypertensive heart disease with heart failure: Secondary | ICD-10-CM | POA: Diagnosis not present

## 2023-04-16 HISTORY — DX: Other obesity due to excess calories: E66.09

## 2023-04-16 NOTE — Assessment & Plan Note (Signed)

## 2023-04-16 NOTE — Assessment & Plan Note (Signed)
She is encouraged to strive for BMI less than 30 to decrease cardiac risk. Advised to aim for at least 150 minutes of exercise per week. She has continued weight loss.

## 2023-04-16 NOTE — Assessment & Plan Note (Signed)
Continue with accupuncture and stretches. If not better recommend to f/u with Orthopedic

## 2023-04-16 NOTE — Progress Notes (Signed)
Madelaine Bhat, CMA,acting as a Neurosurgeon for Arnette Felts, FNP.,have documented all relevant documentation on the behalf of Arnette Felts, FNP,as directed by  Arnette Felts, FNP while in the presence of Arnette Felts, FNP.  Subjective:  Patient ID: Brandi [name] , female    DOB: Nov 14, 1955 , 68 y.o.   MRN: [id number]  Chief Complaint  Patient presents with   Hypertension   Diabetes    HPI  Patient presents today for a bp and dm follow up, Patient reports compliance with medication. Patient denies any chest pain, SOB, or headaches. Patient has no concerns today. Continues to go to [facility]. Reports she has not been sleeping well since she had a friend to pass. 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 She has type 2 diabetes mellitus. Her disease course has been stable. Pertinent negatives for hypoglycemia include no dizziness or headaches. Pertinent negatives for diabetes include no blurred vision, no chest pain, no fatigue, no polydipsia, no polyphagia and no polyuria. Symptoms are stable. Diabetic complications include heart disease (congestive heart failure). Risk factors for coronary artery disease include dyslipidemia, obesity and sedentary lifestyle. Current diabetic treatment includes diet. She is compliant with treatment all of the time. Her weight is decreasing steadily. She has had a previous visit with a dietitian. She participates in exercise every other day. Home blood sugar record trend: fluctuating. (Blood sugar today was 82-118) An ACE inhibitor/angiotensin II receptor blocker is being taken. She does not see a podiatrist.Eye exam is not current (she is due to see the eye doctor next month).  Hypertension This is a chronic problem. The current episode started more than 1 year ago. The problem has been gradually improving  since onset. The problem is uncontrolled. Pertinent negatives include no anxiety, blurred vision, chest pain, headaches or palpitations. Risk factors for coronary artery disease include obesity, dyslipidemia and sedentary lifestyle. Past treatments include calcium channel blockers. There are no compliance problems.  There is no history of angina. There is no history of chronic renal disease.     Past Medical History:  Diagnosis Date   Anemia    Arthritis    Back pain    Class 3 severe obesity with serious comorbidity and body mass index (BMI) of 45.0 to 49.9 in adult [facility]) 02/15/2022   Disequilibrium 06/19/2022   Edema, lower extremity    Food allergy    GERD (gastroesophageal reflux disease)    Hypertension    Hypertensive crisis    Joint pain    Morbid obesity (HCC)    Osteoporosis    Pre-diabetes    Rheumatoid arthritis (HCC)    Sciatica neuralgia    Sleep apnea      Family History  Problem Relation Age of Onset   Hypertension Mother    Cancer Mother    Heart disease Mother    Eating disorder Mother    Obesity Mother    Kidney disease Father    Heart disease Father    Diabetes Father    High blood pressure Father    High Cholesterol Father    Anxiety disorder Father    Bipolar disorder Father    Schizophrenia Father    Hypertension Sister      Current Outpatient Medications:    amLODipine (NORVASC) 5 MG tablet, Take 1.5 tablets (7.5 mg total) by mouth  daily., Disp: 135 tablet, Rfl: 0   atorvastatin (LIPITOR) 20 MG tablet, TAKE 1 TABLET BY MOUTH DAILY, Disp: 90 tablet, Rfl: 0   Biotin 1000 MCG tablet, Take 1,000 mcg by mouth every morning., Disp: , Rfl:    cetirizine (ZYRTEC) 10 MG tablet, Take 10 mg by mouth every morning., Disp: , Rfl:    Cyanocobalamin (VITAMIN B-12 PO), Take 1 drop by mouth daily., Disp: , Rfl:    fluticasone (FLONASE) 50 MCG/ACT nasal spray, 1 spray daily as needed for allergies or rhinitis., Disp: , Rfl:    Magnesium 500 MG TABS, Take 500 mg  by mouth at bedtime., Disp: , Rfl:    meloxicam (MOBIC) 7.5 MG tablet, Take 7.5 mg by mouth daily., Disp: , Rfl:    mupirocin ointment (BACTROBAN) 2 %, Apply 1 Application topically daily., Disp: 22 g, Rfl: 0   Nebivolol HCl 20 MG TABS, TAKE 1 TABLET BY MOUTH EVERY DAY. KEEP UPCOMING APPOINTMENT TO RECEIVE FUTURE REFILLS, Disp: 90 tablet, Rfl: 0   omeprazole (PRILOSEC OTC) 20 MG tablet, Take 20 mg by mouth every morning., Disp: , Rfl:    ondansetron (ZOFRAN) 4 MG tablet, Take 1 tablet (4 mg total) by mouth every 8 (eight) hours as needed for nausea or vomiting., Disp: 20 tablet, Rfl: 0   OVER THE COUNTER MEDICATION, Take 30 mg by mouth 2 (two) times daily. Strengtia 30 mg, Disp: , Rfl:    OVER THE COUNTER MEDICATION, Take 1 tablet by mouth 2 (two) times daily. Hepato - C1, Disp: , Rfl:    OVER THE COUNTER MEDICATION, Take 1 capsule by mouth 2 (two) times daily. Omega CO3-SE, Disp: , Rfl:    tirzepatide (MOUNJARO) 12.5 MG/0.5ML Pen, Inject 12.5 mg into the skin once a week., Disp: 2 mL, Rfl: 0   tizanidine (ZANAFLEX) 2 MG capsule, TAKE 1 CAPSULE(2 MG) BY MOUTH THREE TIMES DAILY AS NEEDED FOR MUSCLE SPASMS, Disp: 60 capsule, Rfl: 1   topiramate (TOPAMAX) 50 MG tablet, TAKE 1 TABLET(50 MG) BY MOUTH TWICE DAILY, Disp: 180 tablet, Rfl: 0   triamcinolone ointment (KENALOG) 0.1 %, Apply 1 Application topically 2 (two) times daily. (Patient taking differently: Apply 1 Application topically 2 (two) times daily as needed.), Disp: 30 g, Rfl: 2   valsartan (DIOVAN) 160 MG tablet, Take 1 tablet (160 mg total) by mouth daily., Disp: 90 tablet, Rfl: 0   Vitamin E 180 MG CAPS, Take 180 mg by mouth every morning., Disp: , Rfl:    Allergies  Allergen Reactions   Hydralazine Itching   Meperidine Hcl Rash and Other (See Comments)    hallucinations   Aliskiren Hives   Aspirin Nausea Only   Juniper Berries Hives   Latex Hives   Rofecoxib Diarrhea    Reaction to Vioxx   Spironolactone Other (See Comments)     High doses caused nausea and rash - may can tolerate low doses   Chocolate Nausea And Vomiting and Rash   Furosemide Other (See Comments)    Rash and extreme dehydration   Oxycodone-Acetaminophen Itching and Rash   Sulfa Antibiotics Rash     Review of Systems  Constitutional:  Negative for fatigue.  Eyes:  Negative for blurred vision.  Respiratory: Negative.    Cardiovascular:  Negative for chest pain, palpitations and leg swelling.  Endocrine: Negative for polydipsia, polyphagia and polyuria.  Musculoskeletal:        Left sciatic nerve pain, continues to walk.   Neurological:  Negative for dizziness and  headaches.  Psychiatric/Behavioral: Negative.       Today's Vitals   04/16/23 1206  BP: 130/80  Pulse: 76  Temp: 98.9 F (37.2 C)  TempSrc: Oral  Weight: 208 lb 6.4 oz (94.5 kg)  Height: 5\' 4"  (1.626 m)  PainSc: 0-No pain   Body mass index is 35.77 kg/m.  Wt Readings from Last 3 Encounters:  04/16/23 208 lb 6.4 oz (94.5 kg)  04/02/23 212 lb (96.2 kg)  02/26/23 207 lb (93.9 kg)    Objective:  Physical Exam Vitals reviewed.  Constitutional:      General: She is not in acute distress.    Appearance: Normal appearance. She is obese.  Eyes:     Pupils: Pupils are equal, round, and reactive to light.  Cardiovascular:     Rate and Rhythm: Normal rate and regular rhythm.     Pulses: Normal pulses.     Heart sounds: Normal heart sounds. No murmur heard. Pulmonary:     Effort: Pulmonary effort is normal. No respiratory distress.     Breath sounds: Normal breath sounds. No wheezing.  Musculoskeletal:        General: No swelling or tenderness. Normal range of motion.     Right lower leg: No edema.     Left lower leg: No edema.  Skin:    General: Skin is warm and dry.     Capillary Refill: Capillary refill takes less than 2 seconds.     Coloration: Skin is not jaundiced.     Findings: No bruising.  Neurological:     General: No focal deficit present.     Mental  Status: She is alert and oriented to person, place, and time.     Cranial Nerves: No cranial nerve deficit.     Motor: No weakness.  Psychiatric:        Mood and Affect: Mood normal.        Behavior: Behavior normal.        Thought Content: Thought content normal.        Judgment: Judgment normal.      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   Orders: -     BMP8+eGFR  Type 2 diabetes mellitus with hyperlipidemia (HCC) Assessment & Plan: HgbA1c has been better controlled, continue current medications and diet.   Orders: -     Hemoglobin A1c  Left sciatic nerve pain Assessment & Plan: Continue with accupuncture and stretches. If not better recommend to f/u with Orthopedic   COVID-19 vaccination declined Assessment & Plan: Declines covid 19 vaccine. Discussed risk of covid 65 and if she changes her mind about the vaccine to call the office. Education has been provided regarding the importance of this vaccine but patient still declined. Advised may receive this vaccine at local pharmacy or Health Dept.or vaccine clinic. Aware to provide a copy of the vaccination record if obtained from local pharmacy or Health Dept.  Encouraged to take multivitamin, vitamin d, vitamin c and zinc to increase immune system. Aware can call office if would like to have vaccine here at office. Verbalized acceptance and understanding.    Class 2 obesity due to excess calories with body mass index (BMI) of 35.0 to 35.9 in adult, unspecified whether serious comorbidity present Assessment & Plan: She is encouraged to strive for BMI less than 30 to decrease cardiac risk. Advised to aim for at least 150  minutes of exercise per week. She has continued weight loss.     Return for keep same next.  Patient was given opportunity to ask questions. Patient verbalized understanding of  the plan and was able to repeat key elements of the plan. All questions were answered to their satisfaction.    Jeanell Sparrow, FNP, have reviewed all documentation for this visit. The documentation on 04/16/23 for the exam, diagnosis, procedures, and orders are all accurate and complete.   IF YOU HAVE BEEN REFERRED TO A SPECIALIST, IT MAY TAKE 1-2 WEEKS TO SCHEDULE/PROCESS THE REFERRAL. IF YOU HAVE NOT HEARD FROM US/SPECIALIST IN TWO WEEKS, PLEASE GIVE [name] A CALL AT [phone].

## 2023-04-16 NOTE — Assessment & Plan Note (Signed)
Blood pressure is well controlled, continue current medications.

## 2023-04-16 NOTE — Assessment & Plan Note (Signed)
HgbA1c has been better controlled, continue current medications and diet.

## 2023-04-17 ENCOUNTER — Encounter: Payer: Self-pay | Admitting: Nurse Practitioner

## 2023-04-17 LAB — BMP8+EGFR
BUN/Creatinine Ratio: 20 (ref 12–28)
BUN: 20 mg/dL (ref 8–27)
CO2: 26 mmol/L (ref 20–29)
Calcium: 9.8 mg/dL (ref 8.7–10.3)
Chloride: 103 mmol/L (ref 96–106)
Creatinine, Ser: 0.98 mg/dL (ref 0.57–1.00)
Glucose: 86 mg/dL (ref 70–99)
Potassium: 4.6 mmol/L (ref 3.5–5.2)
Sodium: 140 mmol/L (ref 134–144)
eGFR: 62 mL/min/{1.73_m2} (ref >59)

## 2023-04-17 LAB — HEMOGLOBIN A1C
Est. average glucose Bld gHb Est-mCnc: 97 mg/dL
Hgb A1c MFr Bld: 5 % (ref 4.8–5.6)

## 2023-04-18 ENCOUNTER — Ambulatory Visit: Payer: Self-pay

## 2023-04-18 NOTE — Patient Outreach (Signed)
  Care Coordination   04/18/2023 Name: Brandi [name] MRN: [id number] DOB: 07/07/1955   Care Coordination Outreach Attempts:  An unsuccessful outreach was attempted for an appointment today.  Follow Up Plan:  Additional outreach attempts will be made to offer the patient complex care management information and services.   Encounter Outcome:  No Answer   Care Coordination Interventions:  No, not indicated    Brandi Sale RN BSN CCM [place]  [facility] Nurse Care Coordinator  Direct Dial: [phone] Website: [name]

## 2023-04-23 DIAGNOSIS — M542 Cervicalgia: Secondary | ICD-10-CM | POA: Diagnosis not present

## 2023-04-23 DIAGNOSIS — M62838 Other muscle spasm: Secondary | ICD-10-CM | POA: Diagnosis not present

## 2023-04-23 DIAGNOSIS — M62830 Muscle spasm of back: Secondary | ICD-10-CM | POA: Diagnosis not present

## 2023-04-23 DIAGNOSIS — M9902 Segmental and somatic dysfunction of thoracic region: Secondary | ICD-10-CM | POA: Diagnosis not present

## 2023-04-23 DIAGNOSIS — M545 Low back pain, unspecified: Secondary | ICD-10-CM | POA: Diagnosis not present

## 2023-04-23 DIAGNOSIS — M9903 Segmental and somatic dysfunction of lumbar region: Secondary | ICD-10-CM | POA: Diagnosis not present

## 2023-04-23 DIAGNOSIS — M9901 Segmental and somatic dysfunction of cervical region: Secondary | ICD-10-CM | POA: Diagnosis not present

## 2023-04-23 DIAGNOSIS — M546 Pain in thoracic spine: Secondary | ICD-10-CM | POA: Diagnosis not present

## 2023-04-24 DIAGNOSIS — H40013 Open angle with borderline findings, low risk, bilateral: Secondary | ICD-10-CM | POA: Diagnosis not present

## 2023-04-24 DIAGNOSIS — H18413 Arcus senilis, bilateral: Secondary | ICD-10-CM | POA: Diagnosis not present

## 2023-04-24 DIAGNOSIS — H2513 Age-related nuclear cataract, bilateral: Secondary | ICD-10-CM | POA: Diagnosis not present

## 2023-04-24 DIAGNOSIS — H2512 Age-related nuclear cataract, left eye: Secondary | ICD-10-CM | POA: Diagnosis not present

## 2023-04-24 DIAGNOSIS — H25043 Posterior subcapsular polar age-related cataract, bilateral: Secondary | ICD-10-CM | POA: Diagnosis not present

## 2023-04-26 DIAGNOSIS — M62838 Other muscle spasm: Secondary | ICD-10-CM | POA: Diagnosis not present

## 2023-04-26 DIAGNOSIS — M9903 Segmental and somatic dysfunction of lumbar region: Secondary | ICD-10-CM | POA: Diagnosis not present

## 2023-04-26 DIAGNOSIS — M546 Pain in thoracic spine: Secondary | ICD-10-CM | POA: Diagnosis not present

## 2023-04-26 DIAGNOSIS — M545 Low back pain, unspecified: Secondary | ICD-10-CM | POA: Diagnosis not present

## 2023-04-26 DIAGNOSIS — M9901 Segmental and somatic dysfunction of cervical region: Secondary | ICD-10-CM | POA: Diagnosis not present

## 2023-04-26 DIAGNOSIS — M9902 Segmental and somatic dysfunction of thoracic region: Secondary | ICD-10-CM | POA: Diagnosis not present

## 2023-04-26 DIAGNOSIS — M62830 Muscle spasm of back: Secondary | ICD-10-CM | POA: Diagnosis not present

## 2023-04-26 DIAGNOSIS — M542 Cervicalgia: Secondary | ICD-10-CM | POA: Diagnosis not present

## 2023-04-30 DIAGNOSIS — M9902 Segmental and somatic dysfunction of thoracic region: Secondary | ICD-10-CM | POA: Diagnosis not present

## 2023-04-30 DIAGNOSIS — M62838 Other muscle spasm: Secondary | ICD-10-CM | POA: Diagnosis not present

## 2023-04-30 DIAGNOSIS — M545 Low back pain, unspecified: Secondary | ICD-10-CM | POA: Diagnosis not present

## 2023-04-30 DIAGNOSIS — M9903 Segmental and somatic dysfunction of lumbar region: Secondary | ICD-10-CM | POA: Diagnosis not present

## 2023-04-30 DIAGNOSIS — M546 Pain in thoracic spine: Secondary | ICD-10-CM | POA: Diagnosis not present

## 2023-04-30 DIAGNOSIS — M542 Cervicalgia: Secondary | ICD-10-CM | POA: Diagnosis not present

## 2023-04-30 DIAGNOSIS — M62830 Muscle spasm of back: Secondary | ICD-10-CM | POA: Diagnosis not present

## 2023-04-30 DIAGNOSIS — M9901 Segmental and somatic dysfunction of cervical region: Secondary | ICD-10-CM | POA: Diagnosis not present

## 2023-05-02 ENCOUNTER — Other Ambulatory Visit: Payer: Self-pay

## 2023-05-02 ENCOUNTER — Ambulatory Visit: Payer: Medicare Other

## 2023-05-02 ENCOUNTER — Ambulatory Visit: Payer: Self-pay | Admitting: Nurse Practitioner

## 2023-05-02 DIAGNOSIS — Z0000 Encounter for general adult medical examination without abnormal findings: Secondary | ICD-10-CM | POA: Diagnosis not present

## 2023-05-02 DIAGNOSIS — M62838 Other muscle spasm: Secondary | ICD-10-CM | POA: Diagnosis not present

## 2023-05-02 DIAGNOSIS — M545 Low back pain, unspecified: Secondary | ICD-10-CM | POA: Diagnosis not present

## 2023-05-02 DIAGNOSIS — M62830 Muscle spasm of back: Secondary | ICD-10-CM | POA: Diagnosis not present

## 2023-05-02 DIAGNOSIS — M546 Pain in thoracic spine: Secondary | ICD-10-CM | POA: Diagnosis not present

## 2023-05-02 DIAGNOSIS — M9901 Segmental and somatic dysfunction of cervical region: Secondary | ICD-10-CM | POA: Diagnosis not present

## 2023-05-02 DIAGNOSIS — M9903 Segmental and somatic dysfunction of lumbar region: Secondary | ICD-10-CM | POA: Diagnosis not present

## 2023-05-02 DIAGNOSIS — M9902 Segmental and somatic dysfunction of thoracic region: Secondary | ICD-10-CM | POA: Diagnosis not present

## 2023-05-02 DIAGNOSIS — M542 Cervicalgia: Secondary | ICD-10-CM | POA: Diagnosis not present

## 2023-05-02 MED ORDER — TOPIRAMATE 50 MG PO TABS: 50.0000 mg | ORAL_TABLET | Freq: Two times a day (BID) | ORAL | 1 refills | Status: DC

## 2023-05-02 NOTE — Progress Notes (Signed)
 Subjective:   Brandi [name] is a 68 y.o. female who presents for Medicare Annual (Subsequent) preventive examination.  Visit Complete: Virtual I connected with  Brandi [name] on 05/02/23 by a audio enabled telemedicine application and verified that I am speaking with the correct person using two identifiers.  Interactive audio and video telecommunications were attempted between this provider and patient, however failed, due to patient having technical difficulties OR patient did not have access to video capability.  We continued and completed visit with audio only.  Patient Location: Home  Provider Location: Office/Clinic  I discussed the limitations of evaluation and management by telemedicine. The patient expressed understanding and agreed to proceed.  Vital Signs: Because this visit was a virtual/telehealth visit, some criteria may be missing or patient reported. Any vitals not documented were not able to be obtained and vitals that have been documented are patient reported.  Patient Medicare AWV questionnaire was completed by the patient on 05/01/2023; I have confirmed that all information answered by patient is correct and no changes since this date.  Cardiac Risk Factors include: advanced age (>25men, >67 women);diabetes mellitus;hypertension     Objective:    Today's Vitals   There is no height or weight on file to calculate BMI.     05/02/2023   10:07 AM 04/06/2022   10:06 AM 03/16/2021   11:07 AM 03/14/2021    4:00 PM 02/21/2021    3:43 PM 02/21/2021   11:05 AM 03/11/2020   10:17 AM  Advanced Directives  Does Patient Have a Medical Advance Directive? No No Yes No Yes No No  Type of Best Boy of [place];Living will  Healthcare Power of [place];Living will    Does patient want to make changes to medical advance directive?     No - Patient declined    Copy of Healthcare Power of Attorney in Chart?   No - copy requested  No - copy requested     Would patient like information on creating a medical advance directive? No - Patient declined    No - Patient declined No - Patient declined No - Patient declined    Current Medications (verified) Outpatient Encounter Medications as of 05/02/2023  Medication Sig   amLODipine  (NORVASC ) 5 MG tablet Take 1.5 tablets (7.5 mg total) by mouth daily.   atorvastatin  (LIPITOR) 20 MG tablet TAKE 1 TABLET BY MOUTH DAILY   Biotin 1000 MCG tablet Take 1,000 mcg by mouth every morning.   cetirizine (ZYRTEC) 10 MG tablet Take 10 mg by mouth every morning.   fluticasone (FLONASE) 50 MCG/ACT nasal spray 1 spray daily as needed for allergies or rhinitis.   Magnesium 500 MG TABS Take 500 mg by mouth at bedtime.   meloxicam (MOBIC) 7.5 MG tablet Take 7.5 mg by mouth daily.   Multiple Vitamin (MULTIVITAMIN ADULT PO) Take 1 tablet by mouth daily.   mupirocin  ointment (BACTROBAN ) 2 % Apply 1 Application topically daily.   Nebivolol  HCl 20 MG TABS TAKE 1 TABLET BY MOUTH EVERY DAY. KEEP UPCOMING APPOINTMENT TO RECEIVE FUTURE REFILLS   omeprazole (PRILOSEC OTC) 20 MG tablet Take 20 mg by mouth every morning.   ondansetron  (ZOFRAN ) 4 MG tablet Take 1 tablet (4 mg total) by mouth every 8 (eight) hours as needed for nausea or vomiting.   OVER THE COUNTER MEDICATION Take 30 mg by mouth 2 (two) times daily. Strengtia 30 mg   OVER THE COUNTER MEDICATION Take 1 tablet by mouth  2 (two) times daily. Hepato - C1   OVER THE COUNTER MEDICATION Take 1 capsule by mouth 2 (two) times daily. Omega CO3-SE   tirzepatide  (MOUNJARO ) 12.5 MG/0.5ML Pen Inject 12.5 mg into the skin once a week.   topiramate  (TOPAMAX ) 50 MG tablet TAKE 1 TABLET(50 MG) BY MOUTH TWICE DAILY   triamcinolone  ointment (KENALOG ) 0.1 % Apply 1 Application topically 2 (two) times daily. (Patient taking differently: Apply 1 Application topically 2 (two) times daily as needed.)   valsartan  (DIOVAN ) 160 MG tablet Take 1 tablet (160 mg total) by mouth daily.   Vitamin  E 180 MG CAPS Take 180 mg by mouth every morning.   Cyanocobalamin (VITAMIN B-12 PO) Take 1 drop by mouth daily. (Patient not taking: Reported on 05/02/2023)   tizanidine  (ZANAFLEX ) 2 MG capsule TAKE 1 CAPSULE(2 MG) BY MOUTH THREE TIMES DAILY AS NEEDED FOR MUSCLE SPASMS (Patient not taking: Reported on 05/02/2023)   No facility-administered encounter medications on file as of 05/02/2023.    Allergies (verified) Hydralazine, Meperidine hcl, Aliskiren, Aspirin, Juniper berries, Latex, Rofecoxib, Spironolactone , Chocolate, Furosemide, Oxycodone-acetaminophen, and Sulfa antibiotics   History: Past Medical History:  Diagnosis Date   Allergy In chart   See chart   Anemia    Arthritis    Back pain    Cataract 10/24   For surgery in March &April   Class 3 severe obesity with serious comorbidity and body mass index (BMI) of 45.0 to 49.9 in adult [facility]) 02/15/2022   Diabetes mellitus without complication (HCC)    Questionable   Disequilibrium 06/19/2022   Edema, lower extremity    Food allergy    GERD (gastroesophageal reflux disease)    Hypertension    Hypertensive crisis    Joint pain    Morbid obesity (HCC)    Osteoporosis    Pre-diabetes    Rheumatoid arthritis (HCC)    Sciatica neuralgia    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   Sleep apnea    Past Surgical History:  Procedure Laterality Date   ABDOMINAL HYSTERECTOMY     PARTIAL   CARPAL TUNNEL RELEASE Right    02/2022   CHOLECYSTECTOMY     JOINT REPLACEMENT     History in chart   ORIF ELBOW FRACTURE Right    ORIF WRIST FRACTURE Right    PROTESTIC HIPS     PROTESTIC KNEE     TUBAL LIGATION     Year-1982   Family History  Problem Relation Age of Onset   Hypertension Mother    Cancer Mother    Heart disease Mother    Eating disorder Mother    Obesity Mother    Kidney disease Father    Heart disease Father    Diabetes Father    High blood pressure Father    High Cholesterol Father    Anxiety disorder Father     Bipolar disorder Father    Schizophrenia Father    Hypertension Sister    Social History   Socioeconomic History   Marital status: Single    Spouse name: Not on file   Number of children: Not on file   Years of education: Not on file   Highest education level: Bachelor's degree (e.g., BA, AB, BS)  Occupational History   Occupation: Retired Counsellor  Tobacco Use   Smoking status: Former    Current packs/day: 0.00    Average packs/day: 0.3 packs/day for 5.0 years (1.3 ttl pk-yrs)  Types: Cigarettes    Quit date: 06/09/1988    Years since quitting: 34.9   Smokeless tobacco: Never   Tobacco comments:    Stop along time ago  Vaping Use   Vaping status: Never Used  Substance and Sexual Activity   Alcohol use: Not Currently   Drug use: Not Currently   Sexual activity: Not Currently  Other Topics Concern   Not on file  Social History Narrative   Lives alone   Right handed   Drinks no caffeine   Social Drivers of Health   Financial Resource Strain: Low Risk  (05/02/2023)   Overall Financial Resource Strain (CARDIA)    Difficulty of Paying Living Expenses: Not hard at all  Food Insecurity: No Food Insecurity (05/02/2023)   Hunger Vital Sign    Worried About Running Out of Food in the Last Year: Never true    Ran Out of Food in the Last Year: Never true  Transportation Needs: No Transportation Needs (05/02/2023)   PRAPARE - Administrator, Civil Service (Medical): No    Lack of Transportation (Non-Medical): No  Physical Activity: Insufficiently Active (05/02/2023)   Exercise Vital Sign    Days of Exercise per Week: 3 days    Minutes of Exercise per Session: 40 min  Stress: No Stress Concern Present (05/02/2023)   [employer] of Occupational Health - Occupational Stress Questionnaire    Feeling of Stress : Not at all  Recent Concern: Stress - Stress Concern Present (04/16/2023)   [employer] of Occupational Health - Occupational Stress  Questionnaire    Feeling of Stress : To some extent  Social Connections: Moderately Integrated (05/02/2023)   Social Connection and Isolation Panel [NHANES]    Frequency of Communication with Friends and Family: More than three times a week    Frequency of Social Gatherings with Friends and Family: More than three times a week    Attends Religious Services: More than 4 times per year    Active Member of [employer] or Organizations: Yes    Attends Engineer, Structural: More than 4 times per year    Marital Status: Divorced    Tobacco Counseling Counseling given: Not Answered Tobacco comments: Stop along time ago   Clinical Intake:  Pre-visit preparation completed: Yes  Pain : No/denies pain     Nutritional Risks: None Diabetes: Yes CBG done?: No Did pt. bring in CBG monitor from home?: No  How often do you need to have someone help you when you read instructions, pamphlets, or other written materials from your doctor or pharmacy?: 1 - Never  Interpreter Needed?: No  Information entered by :: NAllen LPN   Activities of Daily Living    05/01/2023    8:25 AM  In your present state of health, do you have any difficulty performing the following activities:  Hearing? 0  Vision? 1  Comment has cataracts  Difficulty concentrating or making decisions? 0  Walking or climbing stairs? 1  Dressing or bathing? 1  Comment daughter helps  Doing errands, shopping? 0  Preparing Food and eating ? N  Using the Toilet? N  In the past six months, have you accidently leaked urine? N  Do you have problems with loss of bowel control? N  Managing your Medications? N  Managing your Finances? N  Housekeeping or managing your Housekeeping? Y    Patient Care Team: Georgina Speaks, FNP as PCP - General (General Practice) Claudene Victory ORN, MD (Inactive) as  PCP - Cardiology (Cardiology) Little, Clayborne CROME, RN as VBCI Care Management [facility]  Indicate any  recent Medical Services you may have received from other than [facility] providers in the past year (date may be approximate).     Assessment:   This is a routine wellness examination for Brandi [name].  Hearing/Vision screen Hearing Screening - Comments:: Denies hearing issues Vision Screening - Comments:: [facility]   Goals Addressed             This Visit's Progress    Patient Stated       05/02/2023, wants to have better vision and less lower back pain, improve mobility and stability       Depression Screen    05/02/2023   10:09 AM 04/16/2023   12:08 PM 08/15/2022   11:02 AM 06/20/2022   11:12 AM 04/06/2022   10:08 AM 01/31/2022   11:23 AM 12/27/2021   11:02 AM  PHQ 2/9 Scores  PHQ - 2 Score 0 0 0 0 0 0 0  PHQ- 9 Score 1 1 1         Fall Risk    05/01/2023    8:25 AM 04/16/2023   12:08 PM 08/15/2022   11:01 AM 06/20/2022   11:11 AM 04/06/2022   10:07 AM  Fall Risk   Falls in the past year? 0 0 1 0 1  Comment     tripped  Number falls in past yr: 0 0 0  0  Injury with Fall? 0 0 0  0  Risk for fall due to : Medication side effect No Fall Risks Impaired balance/gait;Impaired mobility  Impaired balance/gait;Impaired mobility;Medication side effect  Follow up Falls prevention discussed;Falls evaluation completed Falls prevention discussed Falls evaluation completed  Falls evaluation completed;Education provided;Falls prevention discussed    MEDICARE RISK AT HOME: Medicare Risk at Home Any stairs in or around the home?: (Patient-Rptd) Yes If so, are there any without handrails?: (Patient-Rptd) No Home free of loose throw rugs in walkways, pet beds, electrical cords, etc?: (Patient-Rptd) Yes Adequate lighting in your home to reduce risk of falls?: (Patient-Rptd) Yes Life alert?: (Patient-Rptd) No Use of a cane, walker or w/c?: (Patient-Rptd) Yes Grab bars in the bathroom?: (Patient-Rptd) No Shower chair or bench in shower?: (Patient-Rptd) Yes Elevated toilet  seat or a handicapped toilet?: (Patient-Rptd) Yes  TIMED UP AND GO:  Was the test performed?  No    Cognitive Function:    03/11/2019   11:22 AM 09/26/2018    9:58 AM  MMSE - Mini Mental State Exam  Orientation to time 5 5  Orientation to Place 5 4  Registration 3 3  Attention/ Calculation 5 5  Recall 2 2  Language- name 2 objects 2 2  Language- repeat 1 1  Language- follow 3 step command 3 3  Language- read & follow direction 1 1  Write a sentence 1 1  Copy design 1 1  Total score 29 28        05/02/2023   10:10 AM 04/06/2022   10:09 AM 03/16/2021   11:12 AM 03/11/2020   10:21 AM 03/11/2019   11:18 AM  6CIT Screen  What Year? 0 points 0 points 0 points 0 points 0 points  What month? 0 points 0 points 0 points 0 points 0 points  What time? 0 points 0 points 0 points 0 points 0 points  Count back from 20 0 points 0 points 0 points  0 points 0 points  Months in reverse 0 points 0 points 0 points 0 points 0 points  Repeat phrase 4 points 0 points 0 points 0 points 6 points  Total Score 4 points 0 points 0 points 0 points 6 points    Immunizations Immunization History  Administered Date(s) Administered   Fluad Quad(high Dose 65+) 11/15/2020, 12/27/2021   Fluad Trivalent(High Dose 65+) 12/12/2022   Influenza, High Dose Seasonal PF 01/06/2019   PFIZER Comirnaty(Gray Top)Covid-19 Tri-Sucrose Vaccine 11/15/2020   PFIZER(Purple Top)SARS-COV-2 Vaccination 06/06/2019, 06/30/2019, 01/21/2020   Pfizer Covid-19 Vaccine Bivalent Booster 49yrs & up 04/15/2021   Pfizer(Comirnaty)Fall Seasonal Vaccine 12 years and older 06/20/2022, 01/16/2023   Pneumococcal Conjugate-13 03/11/2019, 11/12/2019   Pneumococcal Polysaccharide-23 01/31/2022   Td 06/30/2021   Zoster Recombinant(Shingrix ) 07/14/2021, 09/19/2021    TDAP status: Up to date  Flu Vaccine status: Up to date  Pneumococcal vaccine status: Up to date  Covid-19 vaccine status: Completed vaccines  Qualifies for Shingles  Vaccine? Yes   Zostavax completed Yes   Shingrix  Completed?: Yes  Screening Tests Health Maintenance  Topic Date Due   OPHTHALMOLOGY EXAM  Never done   COVID-19 Vaccine (8 - 2024-25 season) 05/02/2023 (Originally 03/13/2023)   Diabetic kidney evaluation - Urine ACR  06/20/2023   FOOT EXAM  06/20/2023   HEMOGLOBIN A1C  10/14/2023   Fecal DNA (Cologuard)  03/24/2024   Diabetic kidney evaluation - eGFR measurement  04/15/2024   Medicare Annual Wellness (AWV)  05/01/2024   MAMMOGRAM  12/26/2024   DTaP/Tdap/Td (2 - Tdap) 07/01/2031   Pneumonia Vaccine 46+ Years old  Completed   INFLUENZA VACCINE  Completed   DEXA SCAN  Completed   Hepatitis C Screening  Completed   Zoster Vaccines- Shingrix   Completed   HPV VACCINES  Aged Out    Health Maintenance  Health Maintenance Due  Topic Date Due   OPHTHALMOLOGY EXAM  Never done    Colorectal cancer screening: Type of screening: Cologuard. Completed 03/24/2021. Repeat every 3 years  Mammogram status: Completed 12/27/2022. Repeat every year  Bone Density status: Completed 09/10/2019.   Lung Cancer Screening: (Low Dose CT Chest recommended if Age 33-80 years, 20 pack-year currently smoking OR have quit w/in 15years.) does not qualify.   Lung Cancer Screening Referral: no  Additional Screening:  Hepatitis C Screening: does qualify; Completed 01/06/2019  Vision Screening: Recommended annual ophthalmology exams for early detection of glaucoma and other disorders of the eye. Is the patient up to date with their annual eye exam?  No  Who is the provider or what is the name of the office in which the patient attends annual eye exams? Dr. Patrcia If pt is not established with a provider, would they like to be referred to a provider to establish care? No .   Dental Screening: Recommended annual dental exams for proper oral hygiene  Diabetic Foot Exam: Diabetic Foot Exam: Completed 06/20/2022  Community Resource Referral / Chronic Care  Management: CRR required this visit?  No   CCM required this visit?  No     Plan:     I have personally reviewed and noted the following in the patient's chart:   Medical and social history Use of alcohol, tobacco or illicit drugs  Current medications and supplements including opioid prescriptions. Patient is not currently taking opioid prescriptions. Functional ability and status Nutritional status Physical activity Advanced directives List of other physicians Hospitalizations, surgeries, and ER visits in previous 12 months Vitals Screenings to include cognitive,  depression, and falls Referrals and appointments  In addition, I have reviewed and discussed with patient certain preventive protocols, quality metrics, and best practice recommendations. A written personalized care plan for preventive services as well as general preventive health recommendations were provided to patient.     Brandi FORBES Dawn, LPN   09/27/7972   After Visit Summary: (MyChart) Due to this being a telephonic visit, the after visit summary with patients personalized plan was offered to patient via MyChart   Nurse Notes: none

## 2023-05-02 NOTE — Patient Instructions (Signed)
 Brandi [name] , Thank you for taking time to come for your Medicare Wellness Visit. I appreciate your ongoing commitment to your health goals. Please review the following plan we discussed and let me know if I can assist you in the future.   Referrals/Orders/Follow-Ups/Clinician Recommendations: none  This is a list of the screening recommended for you and due dates:  Health Maintenance  Topic Date Due   Eye exam for diabetics  Never done   COVID-19 Vaccine (8 - 2024-25 season) 05/02/2023*   Yearly kidney health urinalysis for diabetes  06/20/2023   Complete foot exam   06/20/2023   Hemoglobin A1C  10/14/2023   Cologuard (Stool DNA test)  03/24/2024   Yearly kidney function blood test for diabetes  04/15/2024   Medicare Annual Wellness Visit  05/01/2024   Mammogram  12/26/2024   DTaP/Tdap/Td vaccine (2 - Tdap) 07/01/2031   Pneumonia Vaccine  Completed   Flu Shot  Completed   DEXA scan (bone density measurement)  Completed   Hepatitis C Screening  Completed   Zoster (Shingles) Vaccine  Completed   HPV Vaccine  Aged Out  *Topic was postponed. The date shown is not the original due date.    Advanced directives: (ACP Link)Information on Advanced Care Planning can be found at [place]  Secretary of [facility] Advance Health Care Directives Advance Health Care Directives ([email or web address]   Next Medicare Annual Wellness Visit scheduled for next year: Yes  insert Preventive Care attachment Insert FALL PREVENTION attachment if needed

## 2023-05-03 DIAGNOSIS — M9901 Segmental and somatic dysfunction of cervical region: Secondary | ICD-10-CM | POA: Diagnosis not present

## 2023-05-03 DIAGNOSIS — M545 Low back pain, unspecified: Secondary | ICD-10-CM | POA: Diagnosis not present

## 2023-05-03 DIAGNOSIS — M62838 Other muscle spasm: Secondary | ICD-10-CM | POA: Diagnosis not present

## 2023-05-03 DIAGNOSIS — M9903 Segmental and somatic dysfunction of lumbar region: Secondary | ICD-10-CM | POA: Diagnosis not present

## 2023-05-03 DIAGNOSIS — M542 Cervicalgia: Secondary | ICD-10-CM | POA: Diagnosis not present

## 2023-05-03 DIAGNOSIS — M62830 Muscle spasm of back: Secondary | ICD-10-CM | POA: Diagnosis not present

## 2023-05-03 DIAGNOSIS — M9902 Segmental and somatic dysfunction of thoracic region: Secondary | ICD-10-CM | POA: Diagnosis not present

## 2023-05-03 DIAGNOSIS — M546 Pain in thoracic spine: Secondary | ICD-10-CM | POA: Diagnosis not present

## 2023-05-07 ENCOUNTER — Other Ambulatory Visit (HOSPITAL_BASED_OUTPATIENT_CLINIC_OR_DEPARTMENT_OTHER): Payer: Self-pay

## 2023-05-07 ENCOUNTER — Encounter: Payer: Self-pay | Admitting: Bariatrics

## 2023-05-07 ENCOUNTER — Ambulatory Visit (INDEPENDENT_AMBULATORY_CARE_PROVIDER_SITE_OTHER): Payer: Medicare Other | Admitting: Bariatrics

## 2023-05-07 VITALS — BP 138/88 | HR 69 | Temp 97.8°F | Ht 64.0 in | Wt 204.0 lb

## 2023-05-07 DIAGNOSIS — E1169 Type 2 diabetes mellitus with other specified complication: Secondary | ICD-10-CM | POA: Diagnosis not present

## 2023-05-07 DIAGNOSIS — Z7985 Long-term (current) use of injectable non-insulin antidiabetic drugs: Secondary | ICD-10-CM | POA: Diagnosis not present

## 2023-05-07 DIAGNOSIS — M62838 Other muscle spasm: Secondary | ICD-10-CM | POA: Diagnosis not present

## 2023-05-07 DIAGNOSIS — E7849 Other hyperlipidemia: Secondary | ICD-10-CM

## 2023-05-07 DIAGNOSIS — M9901 Segmental and somatic dysfunction of cervical region: Secondary | ICD-10-CM | POA: Diagnosis not present

## 2023-05-07 DIAGNOSIS — M546 Pain in thoracic spine: Secondary | ICD-10-CM | POA: Diagnosis not present

## 2023-05-07 DIAGNOSIS — E785 Hyperlipidemia, unspecified: Secondary | ICD-10-CM | POA: Diagnosis not present

## 2023-05-07 DIAGNOSIS — M542 Cervicalgia: Secondary | ICD-10-CM | POA: Diagnosis not present

## 2023-05-07 DIAGNOSIS — E669 Obesity, unspecified: Secondary | ICD-10-CM

## 2023-05-07 DIAGNOSIS — E66812 Obesity, class 2: Secondary | ICD-10-CM

## 2023-05-07 DIAGNOSIS — M9902 Segmental and somatic dysfunction of thoracic region: Secondary | ICD-10-CM | POA: Diagnosis not present

## 2023-05-07 DIAGNOSIS — M9903 Segmental and somatic dysfunction of lumbar region: Secondary | ICD-10-CM | POA: Diagnosis not present

## 2023-05-07 DIAGNOSIS — Z6835 Body mass index (BMI) 35.0-35.9, adult: Secondary | ICD-10-CM

## 2023-05-07 DIAGNOSIS — M62830 Muscle spasm of back: Secondary | ICD-10-CM | POA: Diagnosis not present

## 2023-05-07 DIAGNOSIS — M545 Low back pain, unspecified: Secondary | ICD-10-CM | POA: Diagnosis not present

## 2023-05-07 MED ORDER — TIRZEPATIDE 12.5 MG/0.5ML ~~LOC~~ SOAJ
12.5000 mg | SUBCUTANEOUS | 0 refills | Status: DC
  Filled 2023-05-07: qty 2, 28d supply, fill #0

## 2023-05-07 NOTE — Progress Notes (Signed)
 WEIGHT SUMMARY AND BIOMETRICS  Weight Lost Since Last Visit: 8lb  Weight Gained Since Last Visit: 0   Vitals Temp: 97.8 F (36.6 C) BP: 138/88 Pulse Rate: 69 SpO2: 100 %   Anthropometric Measurements Height: 5\' 4"  (1.626 m) Weight: 204 lb (92.5 kg) BMI (Calculated): 35 Weight at Last Visit: 212lb Weight Lost Since Last Visit: 8lb Weight Gained Since Last Visit: 0 Starting Weight: 273lb Total Weight Loss (lbs): 69 lb (31.3 kg)   Body Composition  Body Fat %: 50 % Fat Mass (lbs): 102.4 lbs Muscle Mass (lbs): 97.2 lbs Visceral Fat Rating : 15   Other Clinical Data Fasting: no Labs: no Today's Visit #: 32 Starting Date: 09/04/20    OBESITY Brandi [name] is here to discuss her progress with her obesity treatment plan along with follow-up of her obesity related diagnoses.    Nutrition Plan: the Category 1 plan - 100% adherence.  Current exercise: none  Interim History:  She is down another 8 lbs since her last visit. She is back on track. She has not done as much exercise as she would like.  Eating all of the food on the plan., Protein intake is as prescribed, Water intake is adequate., and Denies excessive cravings.   Pharmacotherapy: Caroly is on Mounjaro  12.5 mg SQ weekly Adverse side effects: None Hunger is moderately controlled.  Cravings are moderately controlled.  Assessment/Plan:   Type II Diabetes HgbA1c is at goal. Last A1c was 5.0 CBGs: Not checking      Episodes of hypoglycemia: no Medication(s): Mounjaro  12.5 mg SQ weekly  Lab Results  Component Value Date   HGBA1C 5.0 04/16/2023   HGBA1C 5.2 12/12/2022   HGBA1C 5.1 06/20/2022   Lab Results  Component Value Date   MICROALBUR 10 03/11/2020   LDLCALC 74 12/12/2022   CREATININE 0.98 04/16/2023   No results found for: "GFR"  Plan: Continue and refill Mounjaro  12.5 mg SQ  weekly Continue all other medications.  Will keep all carbohydrates low both sweets and starches.  Will continue exercise regimen to 30 to 60 minutes on most days of the week.  Aim for 7 to 9 hours of sleep nightly.  Eat more low glycemic index foods.    Hyperlipidemia LDL is close to her goal.  Medication(s): Lipitor Cardiovascular risk factors: advanced age (older than 17 for men, 70 for women), diabetes mellitus, dyslipidemia, hypertension, obesity (BMI >= 30 kg/m2), and sedentary lifestyle  Lab Results  Component Value Date   CHOL 157 12/12/2022   HDL 71 12/12/2022   LDLCALC 74 12/12/2022   TRIG 61 12/12/2022   CHOLHDL 2.2 12/12/2022   Lab Results  Component Value Date   ALT 30 06/20/2022   AST 27 06/20/2022   ALKPHOS 150 (H) 06/20/2022   BILITOT 0.3 06/20/2022   The 10-year ASCVD risk score (Arnett DK, et al., 2019) is: 23.7%  Values used to calculate the score:     Age: 61 years     Sex: Female     Is Non-Hispanic African American: Yes     Diabetic: Yes     Tobacco smoker: No     Systolic Blood Pressure: 138 mmHg     Is BP treated: Yes     HDL Cholesterol: 71 mg/dL     Total Cholesterol: 157 mg/dL  Plan:  Continue statin.  Will avoid all trans fats.  Will read labels Will minimize saturated fats except the following: low fat meats in moderation, diary, and limited dark chocolate.  Will continue to pick healthy snacks.       Generalized Obesity: Current BMI BMI (Calculated): 35   Pharmacotherapy Plan Continue and refill  Mounjaro  12.5 mg SQ weekly  Ryder is currently in the action stage of change. As such, her goal is to continue with weight loss efforts.  She has agreed to the Category 1 plan.  Exercise goals: Older adults should determine their level of effort for physical activity relative to their level of fitness.   Behavioral modification strategies: increasing lean protein intake, decrease eating out, meal planning , increase water intake,  better snacking choices, and avoiding temptations.  Mareena has agreed to follow-up with our clinic in 4 weeks.       Objective:   VITALS: Per patient if applicable, see vitals. GENERAL: Alert and in no acute distress. CARDIOPULMONARY: No increased WOB. Speaking in clear sentences.  PSYCH: Pleasant and cooperative. Speech normal rate and rhythm. Affect is appropriate. Insight and judgement are appropriate. Attention is focused, linear, and appropriate.  NEURO: Oriented as arrived to appointment on time with no prompting.   Attestation Statements:    This was prepared with the assistance of Engineer, civil (consulting).  Occasional wrong-word or sound-a-like substitutions may have occurred due to the inherent limitations of voice recognition   Kirk Peper, DO

## 2023-05-10 ENCOUNTER — Other Ambulatory Visit: Payer: Self-pay | Admitting: Physician Assistant

## 2023-05-10 ENCOUNTER — Other Ambulatory Visit: Payer: Self-pay

## 2023-05-10 MED ORDER — TOPIRAMATE 50 MG PO TABS: 50.0000 mg | ORAL_TABLET | Freq: Two times a day (BID) | ORAL | 1 refills | Status: DC

## 2023-05-17 DIAGNOSIS — M9903 Segmental and somatic dysfunction of lumbar region: Secondary | ICD-10-CM | POA: Diagnosis not present

## 2023-05-17 DIAGNOSIS — M9902 Segmental and somatic dysfunction of thoracic region: Secondary | ICD-10-CM | POA: Diagnosis not present

## 2023-05-17 DIAGNOSIS — M62838 Other muscle spasm: Secondary | ICD-10-CM | POA: Diagnosis not present

## 2023-05-17 DIAGNOSIS — M542 Cervicalgia: Secondary | ICD-10-CM | POA: Diagnosis not present

## 2023-05-17 DIAGNOSIS — M9901 Segmental and somatic dysfunction of cervical region: Secondary | ICD-10-CM | POA: Diagnosis not present

## 2023-05-17 DIAGNOSIS — M62830 Muscle spasm of back: Secondary | ICD-10-CM | POA: Diagnosis not present

## 2023-05-17 DIAGNOSIS — M546 Pain in thoracic spine: Secondary | ICD-10-CM | POA: Diagnosis not present

## 2023-05-17 DIAGNOSIS — M545 Low back pain, unspecified: Secondary | ICD-10-CM | POA: Diagnosis not present

## 2023-05-23 DIAGNOSIS — M62838 Other muscle spasm: Secondary | ICD-10-CM | POA: Diagnosis not present

## 2023-05-23 DIAGNOSIS — M545 Low back pain, unspecified: Secondary | ICD-10-CM | POA: Diagnosis not present

## 2023-05-23 DIAGNOSIS — M62830 Muscle spasm of back: Secondary | ICD-10-CM | POA: Diagnosis not present

## 2023-05-23 DIAGNOSIS — M9902 Segmental and somatic dysfunction of thoracic region: Secondary | ICD-10-CM | POA: Diagnosis not present

## 2023-05-23 DIAGNOSIS — M542 Cervicalgia: Secondary | ICD-10-CM | POA: Diagnosis not present

## 2023-05-23 DIAGNOSIS — M9901 Segmental and somatic dysfunction of cervical region: Secondary | ICD-10-CM | POA: Diagnosis not present

## 2023-05-23 DIAGNOSIS — M9903 Segmental and somatic dysfunction of lumbar region: Secondary | ICD-10-CM | POA: Diagnosis not present

## 2023-05-23 DIAGNOSIS — M546 Pain in thoracic spine: Secondary | ICD-10-CM | POA: Diagnosis not present

## 2023-05-28 ENCOUNTER — Telehealth: Payer: Self-pay | Admitting: *Deleted

## 2023-05-28 NOTE — Progress Notes (Unsigned)
 Complex Care Management Care Guide Note  05/28/2023 Name: Brandi [name] MRN: [id number] DOB: 23-Jul-1955  Brandi [name] is a 68 y.o. year old female who is a primary care patient of Arnette Felts, FNP and is actively engaged with the care management team. I reached out to Conley Rolls by phone today to assist with re-scheduling  with the RN Case Manager.  Follow up plan: Unsuccessful telephone outreach attempt made. A HIPAA compliant phone message was left for the patient providing contact information and requesting a return call.  Gwenevere Ghazi  [facility]  Value-Based Care Institute, [facility] Guide  Direct Dial: [phone]  Fax [phone]

## 2023-05-29 NOTE — Progress Notes (Unsigned)
 Complex Care Management Care Guide Note  05/29/2023 Name: Brandi [name] MRN: [id number] DOB: 1955/05/30  Brandi [name] is a 68 y.o. year old female who is a primary care patient of Arnette Felts, FNP and is actively engaged with the care management team. I reached out to Brandi [name] by phone today to assist with re-scheduling  with the RN Case Manager.  Follow up plan: Unsuccessful telephone outreach attempt made. A HIPAA compliant phone message was left for the patient providing contact information and requesting a return call.  Gwenevere Ghazi  [facility]  Value-Based Care Institute, [facility] Guide  Direct Dial: [phone]  Fax [phone]

## 2023-05-30 ENCOUNTER — Telehealth: Payer: Self-pay | Admitting: *Deleted

## 2023-05-30 NOTE — Progress Notes (Signed)
 Complex Care Management Care Guide Note  05/30/2023 Name: CORBIN HOTT MRN: [id number] DOB: [birth year]  ALYONA ROMACK is a 68 y.o. year old female who is a primary care patient of Arnette Felts, FNP and is actively engaged with the care management team. I reached out to Conley Rolls by phone today to assist with re-scheduling  with the RN Case Manager.  Follow up plan: Telephone appointment with complex care management team member scheduled for:  3/19  Gwenevere Ghazi  [facility]  [facility], [facility] Guide  Direct Dial: [phone]  Fax [phone]

## 2023-05-30 NOTE — Progress Notes (Signed)
 Complex Care Management Care Guide Note  05/30/2023 Name: [name] MRN: [id number] DOB: 06-11-1955  Brandi [name] is a 68 y.o. year old female who is a primary care patient of Arnette Felts, FNP and is actively engaged with the care management team. I reached out to Conley Rolls by phone today to assist with re-scheduling  with the RN Case Manager.  Follow up plan: Unsuccessful telephone outreach attempt made. A HIPAA compliant phone message was left for the patient providing contact information and requesting a return call. No further outreach attempts will be made at this time. We have been unable to contact the patient to reschedule for complex care management services.   Gwenevere Ghazi  [facility]  Value[facility]Based Care Institute, [facility] Guide  Direct Dial: [phone]  Fax [phone]

## 2023-06-04 ENCOUNTER — Ambulatory Visit: Payer: Medicare Other | Admitting: Physician Assistant

## 2023-06-05 ENCOUNTER — Other Ambulatory Visit (HOSPITAL_BASED_OUTPATIENT_CLINIC_OR_DEPARTMENT_OTHER): Payer: Self-pay

## 2023-06-05 ENCOUNTER — Encounter: Payer: Self-pay | Admitting: Bariatrics

## 2023-06-05 ENCOUNTER — Ambulatory Visit (INDEPENDENT_AMBULATORY_CARE_PROVIDER_SITE_OTHER): Payer: Medicare Other | Admitting: Bariatrics

## 2023-06-05 VITALS — BP 124/83 | HR 80 | Temp 98.1°F | Ht 64.0 in | Wt 202.0 lb

## 2023-06-05 DIAGNOSIS — E669 Obesity, unspecified: Secondary | ICD-10-CM

## 2023-06-05 DIAGNOSIS — E119 Type 2 diabetes mellitus without complications: Secondary | ICD-10-CM | POA: Diagnosis not present

## 2023-06-05 DIAGNOSIS — E66811 Obesity, class 1: Secondary | ICD-10-CM

## 2023-06-05 DIAGNOSIS — Z6834 Body mass index (BMI) 34.0-34.9, adult: Secondary | ICD-10-CM

## 2023-06-05 DIAGNOSIS — I10 Essential (primary) hypertension: Secondary | ICD-10-CM

## 2023-06-05 DIAGNOSIS — E1169 Type 2 diabetes mellitus with other specified complication: Secondary | ICD-10-CM

## 2023-06-05 DIAGNOSIS — Z7985 Long-term (current) use of injectable non-insulin antidiabetic drugs: Secondary | ICD-10-CM

## 2023-06-05 MED ORDER — TIRZEPATIDE 12.5 MG/0.5ML ~~LOC~~ SOAJ
12.5000 mg | SUBCUTANEOUS | 0 refills | Status: DC
  Filled 2023-06-05: qty 2, 28d supply, fill #0

## 2023-06-05 NOTE — Progress Notes (Signed)
 WEIGHT SUMMARY AND BIOMETRICS  Weight Lost Since Last Visit: 2 lb  Weight Gained Since Last Visit: 0 lb   Vitals Temp: 98.1 F (36.7 C) BP: 124/83 Pulse Rate: 80 SpO2: 97 %   Anthropometric Measurements Height: 5\' 4"  (1.626 m) Weight: 202 lb (91.6 kg) BMI (Calculated): 34.66 Weight at Last Visit: 204 lb Weight Lost Since Last Visit: 2 lb Weight Gained Since Last Visit: 0 lb Starting Weight: 273 lb Total Weight Loss (lbs): 71 lb (32.2 kg)   Body Composition  Body Fat %: 49.8 % Fat Mass (lbs): 100.8 lbs Muscle Mass (lbs): 96.6 lbs Visceral Fat Rating : 15   Other Clinical Data Fasting: no Labs: no Today's Visit #: 34 Starting Date: 09/04/20    OBESITY Zara is here to discuss her progress with her obesity treatment plan along with follow-up of her obesity related diagnoses.    Nutrition Plan: the Category 1 plan - 90% adherence.  Current exercise: walking  Interim History:  She is down 2 lbs since her last visit.  Eating all of the food on the plan., Is not skipping meals, Meeting protein goals., and Water intake is adequate.   Pharmacotherapy: Cheri is on Mounjaro 12.5 mg SQ weekly Adverse side effects: None Hunger is well controlled.  Cravings are moderately controlled.  Assessment/Plan:   Type II Diabetes HgbA1c is at goal. Last A1c was 5.0 CBGs: Fasting 80 to 100        Episodes of hypoglycemia: no Medication(s): Mounjaro 12.5 mg SQ weekly  Lab Results  Component Value Date   HGBA1C 5.0 04/16/2023   HGBA1C 5.2 12/12/2022   HGBA1C 5.1 06/20/2022   Lab Results  Component Value Date   MICROALBUR 10 03/11/2020   LDLCALC 74 12/12/2022   CREATININE 0.98 04/16/2023   No results found for: "GFR"  Plan: Continue and refill Mounjaro 12.5 mg SQ weekly Will continue to check her blood sugars at home.  Will keep all carbohydrates  low both sweets and starches.  Will continue exercise regimen to 30 to 60 minutes on most days of the week.  Aim for 7 to 9 hours of sleep nightly.  Eat more low glycemic index foods.   Hypertension Hypertension stable.  Medication(s): Amlodipine 5 mg 1 daily  and Diovan 160 mg, and Nebivolol 20 mg.   BP Readings from Last 3 Encounters:  06/05/23 124/83  05/07/23 138/88  04/16/23 130/80   Lab Results  Component Value Date   CREATININE 0.98 04/16/2023   CREATININE 0.88 12/12/2022   CREATININE 0.85 06/20/2022   No results found for: "GFR"  Plan: Continue all antihypertensives at current dosages. Will continue to check her blood pressures at home.  No added salt. Will keep sodium content to 1,500 mg or less per day.     Generalized Obesity: Current BMI BMI (Calculated): 34.66   Pharmacotherapy Plan Continue  and refill  Mounjaro 12.5 mg SQ weekly  Kaniya is currently in the action stage of change. As such, her goal is to continue with weight loss efforts.  She has agreed to the Category 1 plan.  Exercise goals: Older adults should do exercises that maintain or improve balance if they are at risk of falling.   Behavioral modification strategies: increasing lean protein intake, meal planning , increase water intake, better snacking choices, planning for success, decrease snacking , and avoiding temptations.  Paxtyn has agreed to follow-up with our clinic in 4 weeks.      Objective:   VITALS: Per patient if applicable, see vitals. GENERAL: Alert and in no acute distress. CARDIOPULMONARY: No increased WOB. Speaking in clear sentences.  PSYCH: Pleasant and cooperative. Speech normal rate and rhythm. Affect is appropriate. Insight and judgement are appropriate. Attention is focused, linear, and appropriate.  NEURO: Oriented as arrived to appointment on time with no prompting.   Attestation Statements:    This was prepared with the assistance of Engineer, civil (consulting).   Occasional wrong-word or sound-a-like substitutions may have occurred due to the inherent limitations of voice recognition   [name], DO

## 2023-06-13 ENCOUNTER — Ambulatory Visit: Payer: Self-pay

## 2023-06-13 NOTE — Patient Outreach (Signed)
 Care Coordination   06/13/2023 Name: Brandi [name] MRN: [id number] DOB: [birth year]   Care Coordination Outreach Attempts:  An unsuccessful outreach was attempted for an appointment today.  Follow Up Plan:  No further outreach attempts will be made at this time. We have been unable to contact the patient to offer or enroll patient in complex care management services.  Encounter Outcome:  No Answer   Care Coordination Interventions:  No, not indicated    Delsa Sale RN BSN CCM [place]  Value[facility] Institute, [facility] Nurse Care Coordinator  Direct Dial: [phone] Website: [email or web address]

## 2023-06-17 ENCOUNTER — Other Ambulatory Visit: Payer: Self-pay | Admitting: Physician Assistant

## 2023-06-17 DIAGNOSIS — I10 Essential (primary) hypertension: Secondary | ICD-10-CM

## 2023-06-20 DIAGNOSIS — H2512 Age-related nuclear cataract, left eye: Secondary | ICD-10-CM | POA: Diagnosis not present

## 2023-06-21 ENCOUNTER — Other Ambulatory Visit: Payer: Self-pay | Admitting: Physician Assistant

## 2023-06-21 DIAGNOSIS — H2511 Age-related nuclear cataract, right eye: Secondary | ICD-10-CM | POA: Diagnosis not present

## 2023-07-03 ENCOUNTER — Other Ambulatory Visit (HOSPITAL_BASED_OUTPATIENT_CLINIC_OR_DEPARTMENT_OTHER): Payer: Self-pay

## 2023-07-03 ENCOUNTER — Ambulatory Visit (INDEPENDENT_AMBULATORY_CARE_PROVIDER_SITE_OTHER): Admitting: Bariatrics

## 2023-07-03 ENCOUNTER — Encounter: Payer: Self-pay | Admitting: Bariatrics

## 2023-07-03 VITALS — BP 152/89 | HR 63 | Temp 98.0°F | Ht 64.0 in | Wt 206.0 lb

## 2023-07-03 DIAGNOSIS — Z6835 Body mass index (BMI) 35.0-35.9, adult: Secondary | ICD-10-CM

## 2023-07-03 DIAGNOSIS — E119 Type 2 diabetes mellitus without complications: Secondary | ICD-10-CM

## 2023-07-03 DIAGNOSIS — Z7985 Long-term (current) use of injectable non-insulin antidiabetic drugs: Secondary | ICD-10-CM | POA: Diagnosis not present

## 2023-07-03 DIAGNOSIS — E785 Hyperlipidemia, unspecified: Secondary | ICD-10-CM | POA: Diagnosis not present

## 2023-07-03 DIAGNOSIS — E7849 Other hyperlipidemia: Secondary | ICD-10-CM

## 2023-07-03 DIAGNOSIS — E669 Obesity, unspecified: Secondary | ICD-10-CM

## 2023-07-03 DIAGNOSIS — E66812 Obesity, class 2: Secondary | ICD-10-CM

## 2023-07-03 DIAGNOSIS — E1169 Type 2 diabetes mellitus with other specified complication: Secondary | ICD-10-CM

## 2023-07-03 MED ORDER — TIRZEPATIDE 12.5 MG/0.5ML ~~LOC~~ SOAJ
12.5000 mg | SUBCUTANEOUS | 0 refills | Status: DC
  Filled 2023-07-03: qty 2, 28d supply, fill #0

## 2023-07-03 NOTE — Progress Notes (Signed)
 WEIGHT SUMMARY AND BIOMETRICS  Weight Lost Since Last Visit: 0  Weight Gained Since Last Visit: 4lb   Vitals Temp: 98 F (36.7 C) BP: (!) 152/89 Pulse Rate: 63 SpO2: 100 %   Anthropometric Measurements Height: 5\' 4"  (1.626 m) Weight: 206 lb (93.4 kg) BMI (Calculated): 35.34 Weight at Last Visit: 202lb Weight Lost Since Last Visit: 0 Weight Gained Since Last Visit: 4lb Starting Weight: 273lb Total Weight Loss (lbs): 67 lb (30.4 kg)   Body Composition  Body Fat %: 49.9 % Fat Mass (lbs): 102.8 lbs Muscle Mass (lbs): 98.2 lbs Visceral Fat Rating : 15   Other Clinical Data Fasting: no Labs: no Today's Visit #: 35 Starting Date: 09/04/20    OBESITY Vivan is here to discuss her progress with her obesity treatment plan along with follow-up of her obesity related diagnoses.    Nutrition Plan: the Category 1 plan - 0% adherence.  Current exercise: none  Interim History:  She is up 4 lbs since her last visit. She celebrated her birthday all last month per patient. According to the bio-impedence scale she is only up 0.1 % of body fat.  Eating all of the food on the plan., Protein intake is as prescribed, Is exceeding snack calorie allotment, Is not skipping meals, and Water intake is adequate.   Pharmacotherapy: Laysha is on Mounjaro 12.5 mg SQ weekly Adverse side effects: None Hunger is moderately controlled.  Cravings are moderately controlled.  Assessment/Plan:   Type II Diabetes HgbA1c is at goal. Last A1c was 5.0 CBGs: Fasting 80 to 106       Episodes of hypoglycemia: no Medication(s): Mounjaro 12.5 mg SQ weekly  Lab Results  Component Value Date   HGBA1C 5.0 04/16/2023   HGBA1C 5.2 12/12/2022   HGBA1C 5.1 06/20/2022   Lab Results  Component Value Date   MICROALBUR 10 03/11/2020   LDLCALC 74 12/12/2022   CREATININE 0.98 04/16/2023    No results found for: "GFR"  Plan: Continue Mounjaro 12.5 mg SQ weekly Continue all other medications.  Will keep all carbohydrates low both sweets and starches.  Will continue exercise regimen to 30 to 60 minutes on most days of the week.  Aim for 7 to 9 hours of sleep nightly.  Eat more low glycemic index foods.  Will cook more at home.   Hyperlipidemia LDL is not at goal. Medication(s): Lipitor Cardiovascular risk factors: advanced age (older than 51 for men, 57 for women), diabetes mellitus, dyslipidemia, hypertension, obesity (BMI >= 30 kg/m2), and sedentary lifestyle  Lab Results  Component Value Date   CHOL 157 12/12/2022   HDL 71 12/12/2022   LDLCALC 74 12/12/2022   TRIG 61 12/12/2022   CHOLHDL 2.2 12/12/2022   Lab Results  Component Value Date   ALT 30 06/20/2022   AST 27 06/20/2022   ALKPHOS 150 (H) 06/20/2022  BILITOT 0.3 06/20/2022   The 10-year ASCVD risk score (Arnett DK, et al., 2019) is: 29.5%   Values used to calculate the score:     Age: 68 years     Sex: Female     Is Non-Hispanic African American: Yes     Diabetic: Yes     Tobacco smoker: No     Systolic Blood Pressure: 152 mmHg     Is BP treated: Yes     HDL Cholesterol: 71 mg/dL     Total Cholesterol: 157 mg/dL  Plan:  Continue statin.  Information sheet on healthy vs unhealthy fats.  Will avoid all trans fats.  Will read labels Will minimize saturated fats except the following: low fat meats in moderation, diary, and limited dark chocolate.  Increase Omega 3 in foods, and consider an Omega 3 supplement.     Generalized Obesity: Current BMI BMI (Calculated): 35.34   Pharmacotherapy Plan Continue and refill  Mounjaro 12.5 mg SQ weekly  Miyanna is currently in the action stage of change. As such, her goal is to continue with weight loss efforts.  She has agreed to the Category 1 plan.  Exercise goals: Older adults should follow the adult guidelines. When older adults cannot meet the  adult guidelines, they should be as physically active as their abilities and conditions will allow.   Behavioral modification strategies: increasing lean protein intake, meal planning , increase water intake, better snacking choices, planning for success, avoiding temptations, keep healthy foods in the home, and weigh protein portions.  Jenetta has agreed to follow-up with our clinic in 4 weeks.     Objective:   VITALS: Per patient if applicable, see vitals. GENERAL: Alert and in no acute distress. CARDIOPULMONARY: No increased WOB. Speaking in clear sentences.  PSYCH: Pleasant and cooperative. Speech normal rate and rhythm. Affect is appropriate. Insight and judgement are appropriate. Attention is focused, linear, and appropriate.  NEURO: Oriented as arrived to appointment on time with no prompting.   Attestation Statements:   This was prepared with the assistance of Engineer, civil (consulting).  Occasional wrong-word or sound-a-like substitutions may have occurred due to the inherent limitations of voice recognition   [name], DO

## 2023-07-18 DIAGNOSIS — H2511 Age-related nuclear cataract, right eye: Secondary | ICD-10-CM | POA: Diagnosis not present

## 2023-07-23 NOTE — Progress Notes (Unsigned)
 Cardiology Office Note    Date:  07/24/2023  ID:  Kitten, Baeder 26-Jun-1955, MRN [id number] PCP:  Susanna Epley, FNP  Cardiologist:  Mickiel Albany, MD (Inactive)  Electrophysiologist:  None   Chief Complaint: f/u HTN, CHF  History of Present Illness: .    Brandi [name] is a 68 y.o. female with visit-pertinent history of severe HTN, hypertensive heart disease, chronic HFpEF, multiple medication intolerances, significant bilateral LE edema, morbid obesity, anemia, arthritis, GERD, RA, sciatica, OSA not using CPAP, pre-diabetes seen for follow-up. She was previously followed by Dr. Felipe Horton. Echo 2020 was technically difficult but showed EF 55-60%, mildly increased LV septal wall thickness, G1DD, valves not well visualized. She has lost a significant amount of weight on GLP1 and no longer pre-diabetic. At last OV 12/2021, Dr. Felipe Horton reduced her atorvastatin .  She is seen for follow-up today with her daughter who follows with Dr. Emmette Harms. She is doing great. She denies any CP, SOB, palpitations, dizziness, syncope. Brings in a log of BPs with primary readings 130s-140s, occasionally lower +/- . She reports she is only taking 1 tablet of amlodipine  5mg  daily - she thinks it was reduced here but I cannot find any mention of that in our records. Regardless she does still have a tendency for some pedal edema at times. No orthopnea.  Labwork independently reviewed: 03/2023 A1C 5, K 4.6, Cr 0.98 11/2022 LDL 74, trig 61 by PCP 05/2022 CBC wnl 2022 TSH wnl  ROS: .    Please see the history of present illness.  All other systems are reviewed and otherwise negative.  Studies Reviewed: Brandi [name]    EKG:  EKG is ordered today, personally reviewed, demonstrating:  EKG Interpretation Date/Time:  Tuesday July 24 2023 11:48:51 EDT Ventricular Rate:  65 PR Interval:  160 QRS Duration:  82 QT Interval:  378 QTC Calculation: 393 R Axis:   38  Text Interpretation: Normal sinus rhythm with sinus  arrhythmia Normal ECG Confirmed by Ridhaan Dreibelbis [phone]) on 07/24/2023 11:56:54 AM   CV Studies: Cardiac studies reviewed are outlined and summarized above. Otherwise please see EMR for full report.   Current Reported Medications:.    Current Meds  Medication Sig   amLODipine  (NORVASC ) 5 MG tablet Take 1.5 tablets (7.5 mg total) by mouth daily.   atorvastatin  (LIPITOR) 20 MG tablet Take 1 tablet (20 mg total) by mouth daily. Please keep scheduled appointment for future refills. Thank you.   Biotin 1000 MCG tablet Take 1,000 mcg by mouth every morning.   cetirizine (ZYRTEC) 10 MG tablet Take 10 mg by mouth every morning.   Cyanocobalamin (VITAMIN B-12 PO) Take 1 drop by mouth daily.   fluticasone (FLONASE) 50 MCG/ACT nasal spray 1 spray daily as needed for allergies or rhinitis.   gatifloxacin (ZYMAXID) 0.5 % SOLN Place 1 drop into the right eye 4 (four) times daily.   Magnesium 500 MG TABS Take 500 mg by mouth at bedtime.   meloxicam (MOBIC) 7.5 MG tablet Take 7.5 mg by mouth daily.   Multiple Vitamin (MULTIVITAMIN ADULT PO) Take 1 tablet by mouth daily.   mupirocin  ointment (BACTROBAN ) 2 % Apply 1 Application topically daily.   Nebivolol  HCl 20 MG TABS TAKE 1 TABLET BY MOUTH EVERY DAY. KEEP UPCOMING APPOINTMENT TO RECEIVE FUTURE REFILLS   omeprazole (PRILOSEC OTC) 20 MG tablet Take 20 mg by mouth every morning.   ondansetron  (ZOFRAN ) 4 MG tablet Take 1 tablet (4 mg total) by mouth every 8 (  eight) hours as needed for nausea or vomiting.   OVER THE COUNTER MEDICATION Take 30 mg by mouth 2 (two) times daily. Strengtia 30 mg   OVER THE COUNTER MEDICATION Take 1 tablet by mouth 2 (two) times daily. Hepato - C1   OVER THE COUNTER MEDICATION Take 1 capsule by mouth 2 (two) times daily. Omega CO3-SE   prednisoLONE acetate (PRED FORTE) 1 % ophthalmic suspension Place 1 drop into the right eye 4 (four) times daily.   tirzepatide  (MOUNJARO ) 12.5 MG/0.5ML Pen Inject 12.5 mg into the skin once a week.    tizanidine  (ZANAFLEX ) 2 MG capsule TAKE 1 CAPSULE(2 MG) BY MOUTH THREE TIMES DAILY AS NEEDED FOR MUSCLE SPASMS   topiramate  (TOPAMAX ) 50 MG tablet Take 1 tablet (50 mg total) by mouth 2 (two) times daily.   triamcinolone  ointment (KENALOG ) 0.1 % Apply 1 Application topically 2 (two) times daily. (Patient taking differently: Apply 1 Application topically 2 (two) times daily as needed.)   valsartan  (DIOVAN ) 160 MG tablet TAKE 1 TABLET(160 MG) BY MOUTH DAILY   Vitamin E 180 MG CAPS Take 180 mg by mouth every morning.    Physical Exam:    VS:  BP 132/88   Pulse 65   Ht 5\' 4"  (1.626 m)   Wt 210 lb (95.3 kg)   SpO2 99%   BMI 36.05 kg/m    Wt Readings from Last 3 Encounters:  07/24/23 210 lb (95.3 kg)  07/03/23 206 lb (93.4 kg)  06/05/23 202 lb (91.6 kg)    GEN: Well nourished, well developed in no acute distress NECK: No JVD; No carotid bruits CARDIAC: RRR, soft SEM RUSB, no rubs or gallops RESPIRATORY:  Clear to auscultation without rales, wheezing or rhonchi  ABDOMEN: Soft, non-tender, non-distended EXTREMITIES:  Mild pedal edema bilaterally; No acute deformity   Asessement and Plan:.    1. Chronic HFpEF with HTN - she has mild edema on exam but otherwise lungs are clear. SBPs have not been optimally controlled. We will update her med list to reflect that she is on amlodipine  5mg  daily (unclear why reduced, but will not bump back up at this time given h/o peripheral edema). Continue nebivolol  20mg  daily. Continue valsartan  160mg  daily. Will add trial of SGLT2i Farxiga 10mg  daily as previously suggested by Dr. Felipe Horton. Denies hx of frequent UTIs/yeast infections. If her insurance won't cover, OK to change to Jardiance 10mg  daily. Get baseline labs today - due for CMET given statin. Recheck BMET in 2 weeks. RTC 6 weeks with me with BP log. Can consider titration of ARB or switch to Entresto in the future if BPs remain suboptimally controlled. Previous med intolerances reviewed.  2. Systolic  murmur - denies history of this. ? Aortic sclerosis. Obtain f/u echo.   3. Morbid obesity with OSA - continues to follow with [facility]. She does not use CPAP at this time and feels her sleeping has improved with weight loss. Advised her to let us  know if she wishes to pursue.  4. Lipid management - f/u CMET as above. Lipids more recently have been trended by primary care.    Disposition: F/u with me in 6 weeks. F/u to establish care with Dr. Emmette Harms in 6 months.  Signed, Kree Armato N Worth Kober, PA-C

## 2023-07-24 ENCOUNTER — Ambulatory Visit: Payer: Medicare Other | Attending: Physician Assistant | Admitting: Physician Assistant

## 2023-07-24 ENCOUNTER — Encounter: Payer: Self-pay | Admitting: Physician Assistant

## 2023-07-24 VITALS — BP 132/88 | HR 65 | Ht 64.0 in | Wt 210.0 lb

## 2023-07-24 DIAGNOSIS — G4733 Obstructive sleep apnea (adult) (pediatric): Secondary | ICD-10-CM | POA: Diagnosis not present

## 2023-07-24 DIAGNOSIS — I10 Essential (primary) hypertension: Secondary | ICD-10-CM | POA: Diagnosis not present

## 2023-07-24 DIAGNOSIS — Z79899 Other long term (current) drug therapy: Secondary | ICD-10-CM

## 2023-07-24 DIAGNOSIS — R011 Cardiac murmur, unspecified: Secondary | ICD-10-CM | POA: Diagnosis not present

## 2023-07-24 DIAGNOSIS — I5032 Chronic diastolic (congestive) heart failure: Secondary | ICD-10-CM | POA: Diagnosis not present

## 2023-07-24 MED ORDER — DAPAGLIFLOZIN PROPANEDIOL 10 MG PO TABS: 10.0000 mg | ORAL_TABLET | Freq: Every day | ORAL | Status: DC

## 2023-07-24 MED ORDER — DAPAGLIFLOZIN PROPANEDIOL 10 MG PO TABS: 10.0000 mg | ORAL_TABLET | Freq: Every day | ORAL | 3 refills | Status: DC

## 2023-07-24 NOTE — Patient Instructions (Addendum)
 Medication Instructions:  Start Farxiga 10 mg once daily  *If you need a refill on your cardiac medications before your next appointment, please call your pharmacy*  Lab Work: CBC, BMET, TSH today BMET in 2 weeks If you have labs (blood work) drawn today and your tests are completely normal, you will receive your results only by: MyChart Message (if you have MyChart) OR A paper copy in the mail If you have any lab test that is abnormal or we need to change your treatment, we will call you to review the results.  Testing/Procedures: Echo Your physician has requested that you have an echocardiogram. Echocardiography is a painless test that uses sound waves to create images of your heart. It provides your doctor with information about the size and shape of your heart and how well your heart's chambers and valves are working. This procedure takes approximately one hour. There are no restrictions for this procedure. Please do NOT wear cologne, perfume, aftershave, or lotions (deodorant is allowed). Please arrive 15 minutes prior to your appointment time.  Please note: We ask at that you not bring children with you during ultrasound (echo/ vascular) testing. Due to room size and safety concerns, children are not allowed in the ultrasound rooms during exams. Our front office staff cannot provide observation of children in our lobby area while testing is being conducted. An adult accompanying a patient to their appointment will only be allowed in the ultrasound room at the discretion of the ultrasound technician under special circumstances. We apologize for any inconvenience.   Follow-Up: At [facility], you and your health needs are our priority.  As part of our continuing mission to provide you with exceptional heart care, our providers are all part of one team.  This team includes your primary Cardiologist (physician) and Advanced Practice Providers or APPs (Physician Assistants and Nurse  Practitioners) who all work together to provide you with the care you need, when you need it.  Your next appointment:   6 weeks   Provider:   Lisabeth Rider  **Dr. Emmette Harms in 6 months  We recommend signing up for the patient portal called "MyChart".  Sign up information is provided on this After Visit Summary.  MyChart is used to connect with patients for Virtual Visits (Telemedicine).  Patients are able to view lab/test results, encounter notes, upcoming appointments, etc.  Non-urgent messages can be sent to your provider as well.   To learn more about what you can do with MyChart, go to [email or web address].   Other Instructions    If your insurance does not cover Elvina Hammers, have your pharmacy call us  and we will switch to Jardiance.

## 2023-07-24 NOTE — Addendum Note (Signed)
 Addended by: Render Carrie on: 07/24/2023 12:37 PM   Modules accepted: Orders

## 2023-07-25 ENCOUNTER — Other Ambulatory Visit: Payer: Self-pay | Admitting: *Deleted

## 2023-07-25 DIAGNOSIS — Z79899 Other long term (current) drug therapy: Secondary | ICD-10-CM

## 2023-07-25 DIAGNOSIS — R011 Cardiac murmur, unspecified: Secondary | ICD-10-CM

## 2023-07-25 DIAGNOSIS — I10 Essential (primary) hypertension: Secondary | ICD-10-CM

## 2023-07-25 DIAGNOSIS — G4733 Obstructive sleep apnea (adult) (pediatric): Secondary | ICD-10-CM

## 2023-07-25 DIAGNOSIS — I5032 Chronic diastolic (congestive) heart failure: Secondary | ICD-10-CM

## 2023-07-25 LAB — CBC
Hematocrit: 37.1 % (ref 34.0–46.6)
Hemoglobin: 12.5 g/dL (ref 11.1–15.9)
MCH: 31.6 pg (ref 26.6–33.0)
MCHC: 33.7 g/dL (ref 31.5–35.7)
MCV: 94 fL (ref 79–97)
Platelets: 222 10*3/uL (ref 150–450)
RBC: 3.95 x10E6/uL (ref 3.77–5.28)
RDW: 13.5 % (ref 11.7–15.4)
WBC: 4.6 10*3/uL (ref 3.4–10.8)

## 2023-07-25 LAB — BASIC METABOLIC PANEL WITH GFR
BUN/Creatinine Ratio: 29 — ABNORMAL HIGH (ref 12–28)
BUN: 26 mg/dL (ref 8–27)
CO2: 21 mmol/L (ref 20–29)
Calcium: 9.7 mg/dL (ref 8.7–10.3)
Chloride: 108 mmol/L — ABNORMAL HIGH (ref 96–106)
Creatinine, Ser: 0.9 mg/dL (ref 0.57–1.00)
Glucose: 85 mg/dL (ref 70–99)
Potassium: 4.5 mmol/L (ref 3.5–5.2)
Sodium: 143 mmol/L (ref 134–144)
eGFR: 69 mL/min/{1.73_m2} (ref >59)

## 2023-07-25 LAB — TSH: TSH: 1.97 u[IU]/mL (ref 0.450–4.500)

## 2023-07-25 NOTE — Addendum Note (Signed)
 Addended by: Angelina Kempf on: 07/25/2023 08:40 AM   Modules accepted: Orders

## 2023-07-29 ENCOUNTER — Other Ambulatory Visit: Payer: Self-pay | Admitting: Physician Assistant

## 2023-07-30 ENCOUNTER — Encounter: Payer: Self-pay | Admitting: Bariatrics

## 2023-07-30 ENCOUNTER — Other Ambulatory Visit (HOSPITAL_BASED_OUTPATIENT_CLINIC_OR_DEPARTMENT_OTHER): Payer: Self-pay

## 2023-07-30 ENCOUNTER — Ambulatory Visit (INDEPENDENT_AMBULATORY_CARE_PROVIDER_SITE_OTHER): Admitting: Bariatrics

## 2023-07-30 VITALS — BP 121/75 | HR 62 | Temp 98.2°F | Ht 64.0 in | Wt 206.0 lb

## 2023-07-30 DIAGNOSIS — E119 Type 2 diabetes mellitus without complications: Secondary | ICD-10-CM | POA: Diagnosis not present

## 2023-07-30 DIAGNOSIS — Z7985 Long-term (current) use of injectable non-insulin antidiabetic drugs: Secondary | ICD-10-CM

## 2023-07-30 DIAGNOSIS — I10 Essential (primary) hypertension: Secondary | ICD-10-CM

## 2023-07-30 DIAGNOSIS — Z6835 Body mass index (BMI) 35.0-35.9, adult: Secondary | ICD-10-CM | POA: Diagnosis not present

## 2023-07-30 DIAGNOSIS — E662 Morbid (severe) obesity with alveolar hypoventilation: Secondary | ICD-10-CM

## 2023-07-30 DIAGNOSIS — E669 Obesity, unspecified: Secondary | ICD-10-CM

## 2023-07-30 DIAGNOSIS — E1169 Type 2 diabetes mellitus with other specified complication: Secondary | ICD-10-CM

## 2023-07-30 MED ORDER — TIRZEPATIDE 12.5 MG/0.5ML ~~LOC~~ SOAJ
12.5000 mg | SUBCUTANEOUS | 0 refills | Status: DC
  Filled 2023-07-30: qty 2, 28d supply, fill #0

## 2023-07-30 NOTE — Progress Notes (Unsigned)
 WEIGHT SUMMARY AND BIOMETRICS  Weight Lost Since Last Visit: 0  Weight Gained Since Last Visit: 0   Vitals Temp: 98.2 F (36.8 C) BP: 121/75 Pulse Rate: 62 SpO2: 100 %   Anthropometric Measurements Height: 5\' 4"  (1.626 m) Weight: 206 lb (93.4 kg) BMI (Calculated): 35.34 Weight at Last Visit: 206lb Weight Lost Since Last Visit: 0 Weight Gained Since Last Visit: 0 Starting Weight: 273lb Total Weight Loss (lbs): 67 lb (30.4 kg)   Body Composition  Body Fat %: 51 % Fat Mass (lbs): 105.2 lbs Muscle Mass (lbs): 95.8 lbs Visceral Fat Rating : 16   Other Clinical Data Fasting: no Labs: no Today's Visit #: 16 Starting Date: 09/04/20    OBESITY Macaila is here to discuss her progress with her obesity treatment plan along with follow-up of her obesity related diagnoses.    Nutrition Plan: the Category 1 plan - 75% adherence.  Current exercise: walking  Interim History:  Her Weight remains the same.  Eating all of the food on the plan., Protein intake is as prescribed, Is not skipping meals, and Water intake is adequate.   Pharmacotherapy: Vernida is on Mounjaro  12.5 mg SQ weekly Adverse side effects: None Hunger is moderately controlled.  Cravings are moderately controlled.  Assessment/Plan:    Type II Diabetes HgbA1c is at goal. Last A1c was 5.0 Episodes of hypoglycemia: no Medication(s): Mounjaro  12.5 mg SQ weekly  Lab Results  Component Value Date   HGBA1C 5.0 04/16/2023   HGBA1C 5.2 12/12/2022   HGBA1C 5.1 06/20/2022   Lab Results  Component Value Date   MICROALBUR 10 03/11/2020   LDLCALC 74 12/12/2022   CREATININE 0.90 07/24/2023   No results found for: "GFR"  Plan: Continue and refill Mounjaro  12.5 mg SQ weekly Will pick healthy snacks.  Will keep all carbohydrates low both sweets and starches.  Will continue exercise regimen to  30 to 60 minutes on most days of the week.  Aim for 7 to 9 hours of sleep nightly.  Eat more low glycemic index foods.    Hypertension Hypertension stable.  Medication(s): Amlodipine  5 mg 1 daily , Nebivolol  20 mg daily, Diovan  160 mg daily,   BP Readings from Last 3 Encounters:  07/30/23 121/75  07/24/23 132/88  07/03/23 (!) 152/89   Lab Results  Component Value Date   CREATININE 0.90 07/24/2023   CREATININE 0.98 04/16/2023   CREATININE 0.88 12/12/2022   No results found for: "GFR"  Plan: Continue all antihypertensives at current dosages. No added salt. Will keep sodium content to 1,500 mg or less per day.   Will resume exercise.    Generalized Obesity: Current BMI BMI (Calculated): 35.34   Pharmacotherapy Plan Continue and refill  Mounjaro  12.5 mg SQ weekly  Elsy is currently in the action stage of change. As such, her goal is to continue with weight loss  efforts.  She has agreed to the Category 1 plan.  Exercise goals: Older adults should determine their level of effort for physical activity relative to their level of fitness.  She needs to get out to the [facility].   Behavioral modification strategies: increasing lean protein intake, decreasing simple carbohydrates , no meal skipping, meal planning , increase water intake, better snacking choices, planning for success, increasing vegetables, avoiding temptations, and keep healthy foods in the home.  Iline has agreed to follow-up with our clinic in 4 weeks.      Objective:   VITALS: Per patient if applicable, see vitals. GENERAL: Alert and in no acute distress. CARDIOPULMONARY: No increased WOB. Speaking in clear sentences.  PSYCH: Pleasant and cooperative. Speech normal rate and rhythm. Affect is appropriate. Insight and judgement are appropriate. Attention is focused, linear, and appropriate.  NEURO: Oriented as arrived to appointment on time with no prompting.   Attestation Statements:   This was prepared with  the assistance of Engineer, civil (consulting).  Occasional wrong-word or sound-a-like substitutions may have occurred due to the inherent limitations of voice recognition   Kirk Peper, DO

## 2023-08-07 ENCOUNTER — Telehealth: Payer: Self-pay

## 2023-08-07 ENCOUNTER — Other Ambulatory Visit (HOSPITAL_COMMUNITY): Payer: Self-pay

## 2023-08-07 NOTE — Telephone Encounter (Signed)
 Pharmacy Patient Advocate Encounter  Received notification from [facility] that Prior Authorization for FARXIGA  has been APPROVED from 07/08/23 to 08/06/24. Ran test claim, Copay is $30. This test claim was processed through [facility]- copay amounts may vary at other pharmacies due to pharmacy/plan contracts, or as the patient moves through the different stages of their insurance plan.

## 2023-08-07 NOTE — Telephone Encounter (Signed)
 Pharmacy Patient Advocate Encounter   Received notification from CoverMyMeds that prior authorization for FARXIGA  is required/requested.   Insurance verification completed.   The patient is insured through [facility] .   Per test claim: PA required; PA submitted to above mentioned insurance via CoverMyMeds Key/confirmation #/[id number] Status is pending

## 2023-08-08 DIAGNOSIS — G4733 Obstructive sleep apnea (adult) (pediatric): Secondary | ICD-10-CM | POA: Diagnosis not present

## 2023-08-08 DIAGNOSIS — I10 Essential (primary) hypertension: Secondary | ICD-10-CM | POA: Diagnosis not present

## 2023-08-08 DIAGNOSIS — I5032 Chronic diastolic (congestive) heart failure: Secondary | ICD-10-CM | POA: Diagnosis not present

## 2023-08-08 DIAGNOSIS — R011 Cardiac murmur, unspecified: Secondary | ICD-10-CM | POA: Diagnosis not present

## 2023-08-09 ENCOUNTER — Ambulatory Visit: Payer: Self-pay | Admitting: Physician Assistant

## 2023-08-09 DIAGNOSIS — Z79899 Other long term (current) drug therapy: Secondary | ICD-10-CM

## 2023-08-09 LAB — COMPREHENSIVE METABOLIC PANEL WITH GFR
ALT: 25 IU/L (ref 0–32)
AST: 23 IU/L (ref 0–40)
Albumin: 4.3 g/dL (ref 3.9–4.9)
Alkaline Phosphatase: 161 IU/L — ABNORMAL HIGH (ref 44–121)
BUN/Creatinine Ratio: 25 (ref 12–28)
BUN: 22 mg/dL (ref 8–27)
Bilirubin Total: 0.2 mg/dL (ref 0.0–1.2)
CO2: 17 mmol/L — ABNORMAL LOW (ref 20–29)
Calcium: 9.8 mg/dL (ref 8.7–10.3)
Chloride: 108 mmol/L — ABNORMAL HIGH (ref 96–106)
Creatinine, Ser: 0.89 mg/dL (ref 0.57–1.00)
Globulin, Total: 2.7 g/dL (ref 1.5–4.5)
Glucose: 91 mg/dL (ref 70–99)
Potassium: 4.2 mmol/L (ref 3.5–5.2)
Sodium: 140 mmol/L (ref 134–144)
Total Protein: 7 g/dL (ref 6.0–8.5)
eGFR: 70 mL/min/{1.73_m2} (ref >59)

## 2023-08-10 ENCOUNTER — Other Ambulatory Visit: Payer: Self-pay | Admitting: *Deleted

## 2023-08-10 DIAGNOSIS — Z79899 Other long term (current) drug therapy: Secondary | ICD-10-CM

## 2023-08-16 DIAGNOSIS — Z79899 Other long term (current) drug therapy: Secondary | ICD-10-CM | POA: Diagnosis not present

## 2023-08-17 ENCOUNTER — Ambulatory Visit: Payer: Self-pay | Admitting: Physician Assistant

## 2023-08-17 LAB — BASIC METABOLIC PANEL WITH GFR
BUN/Creatinine Ratio: 23 (ref 12–28)
BUN: 21 mg/dL (ref 8–27)
CO2: 19 mmol/L — ABNORMAL LOW (ref 20–29)
Calcium: 9.7 mg/dL (ref 8.7–10.3)
Chloride: 105 mmol/L (ref 96–106)
Creatinine, Ser: 0.91 mg/dL (ref 0.57–1.00)
Glucose: 79 mg/dL (ref 70–99)
Potassium: 4.4 mmol/L (ref 3.5–5.2)
Sodium: 139 mmol/L (ref 134–144)
eGFR: 68 mL/min/{1.73_m2} (ref >59)

## 2023-08-22 ENCOUNTER — Telehealth: Payer: Self-pay

## 2023-08-22 NOTE — Telephone Encounter (Signed)
 PA for Mounjaro  12.5 has been approved. PA is now complete.      Message from Plan Your PA request has been approved. Additional information will be provided in the approval communication. (Message 1145). Authorization Expiration Date: Aug 21, 2024.

## 2023-08-22 NOTE — Telephone Encounter (Signed)
 PA for Mounjaro 12.5 has been submitted, awaiting PA questions.

## 2023-08-22 NOTE — Telephone Encounter (Signed)
 PA questions for Mounjaro 12.5  have been answered and all documentation has been included. Waiting on a determination.

## 2023-08-23 ENCOUNTER — Ambulatory Visit (HOSPITAL_COMMUNITY)
Admission: RE | Admit: 2023-08-23 | Discharge: 2023-08-23 | Disposition: A | Discharge status: Home / Self Care | Source: Ambulatory Visit | Attending: Cardiology | Admitting: Cardiology

## 2023-08-23 DIAGNOSIS — I5032 Chronic diastolic (congestive) heart failure: Secondary | ICD-10-CM

## 2023-08-23 DIAGNOSIS — G4733 Obstructive sleep apnea (adult) (pediatric): Secondary | ICD-10-CM

## 2023-08-23 DIAGNOSIS — R011 Cardiac murmur, unspecified: Secondary | ICD-10-CM | POA: Diagnosis not present

## 2023-08-23 DIAGNOSIS — I10 Essential (primary) hypertension: Secondary | ICD-10-CM | POA: Diagnosis not present

## 2023-08-23 LAB — ECHOCARDIOGRAM COMPLETE: S' Lateral: 1.78 cm

## 2023-08-24 ENCOUNTER — Other Ambulatory Visit: Payer: Self-pay | Admitting: Physician Assistant

## 2023-08-24 NOTE — Progress Notes (Signed)
 Removing Farxiga  from med list as we stopped due to abnormal labs in May.

## 2023-09-03 ENCOUNTER — Other Ambulatory Visit: Payer: Self-pay | Admitting: Physician Assistant

## 2023-09-05 ENCOUNTER — Ambulatory Visit (INDEPENDENT_AMBULATORY_CARE_PROVIDER_SITE_OTHER): Admitting: Bariatrics

## 2023-09-05 ENCOUNTER — Encounter: Payer: Self-pay | Admitting: Bariatrics

## 2023-09-05 VITALS — BP 143/74 | HR 72 | Temp 97.8°F | Ht 64.0 in | Wt 203.0 lb

## 2023-09-05 DIAGNOSIS — E1169 Type 2 diabetes mellitus with other specified complication: Secondary | ICD-10-CM

## 2023-09-05 DIAGNOSIS — Z7985 Long-term (current) use of injectable non-insulin antidiabetic drugs: Secondary | ICD-10-CM

## 2023-09-05 DIAGNOSIS — G4733 Obstructive sleep apnea (adult) (pediatric): Secondary | ICD-10-CM | POA: Diagnosis not present

## 2023-09-05 DIAGNOSIS — E119 Type 2 diabetes mellitus without complications: Secondary | ICD-10-CM | POA: Diagnosis not present

## 2023-09-05 DIAGNOSIS — Z6834 Body mass index (BMI) 34.0-34.9, adult: Secondary | ICD-10-CM

## 2023-09-05 DIAGNOSIS — E669 Obesity, unspecified: Secondary | ICD-10-CM | POA: Diagnosis not present

## 2023-09-05 DIAGNOSIS — E6609 Other obesity due to excess calories: Secondary | ICD-10-CM

## 2023-09-05 MED ORDER — TIRZEPATIDE 12.5 MG/0.5ML ~~LOC~~ SOAJ: 12.5000 mg | SUBCUTANEOUS | 0 refills | Status: DC

## 2023-09-05 NOTE — Progress Notes (Signed)
 WEIGHT SUMMARY AND BIOMETRICS  Weight Lost Since Last Visit: 3lb  Weight Gained Since Last Visit: 0   Vitals Temp: 97.8 F (36.6 C) BP: (!) 143/74 Pulse Rate: 72 SpO2: 100 %   Anthropometric Measurements Height: 5' 4 (1.626 m) Weight: 203 lb (92.1 kg) BMI (Calculated): 34.83 Weight at Last Visit: 206lb Weight Lost Since Last Visit: 3lb Weight Gained Since Last Visit: 0 Starting Weight: 273lb Total Weight Loss (lbs): 70 lb (31.8 kg)   Body Composition  Body Fat %: 51 % Fat Mass (lbs): 103.6 lbs Muscle Mass (lbs): 94.6 lbs Visceral Fat Rating : 15   Other Clinical Data Fasting: no Labs: no Today's Visit #: 37 Starting Date: 09/04/20    OBESITY Brandi [name] is here to discuss her progress with her obesity treatment plan along with follow-up of her obesity related diagnoses.    Nutrition Plan: the Category 1 plan - 50% adherence.  Current exercise: walking  Interim History:  She is down 3 lbs since her last visit. She has been skipping her meals, but got her protein in over the last 4 weeks.  Protein intake is as prescribed, Is skipping meals, Water intake is adequate., Denies polyphagia, and Denies excessive cravings.   Pharmacotherapy: Brandi [name] is on Mounjaro  12.5 mg SQ weekly Adverse side effects: None Hunger is moderately controlled.  Cravings are moderately controlled.  Assessment/Plan:   Type II Diabetes HgbA1c is at goal. Last A1c was 5.0 Episodes of hypoglycemia: no Medication(s): Mounjaro  12.5 mg SQ weekly  Lab Results  Component Value Date   HGBA1C 5.0 04/16/2023   HGBA1C 5.2 12/12/2022   HGBA1C 5.1 06/20/2022   Lab Results  Component Value Date   MICROALBUR 10 03/11/2020   LDLCALC 74 12/12/2022   CREATININE 0.91 08/16/2023   No results found for: GFR  Plan: Continue and refill Mounjaro  12.5 mg SQ weekly Will keep all  carbohydrates low both sweets and starches.  Will continue exercise regimen to 30 to 60 minutes on most days of the week.  Aim for 7 to 9 hours of sleep nightly.  Eat more low glycemic index foods.   Obstructive Sleep Apnea Brandi [name] has a diagnosis of sleep apnea. She reports that she is using a CPAP regularly. Reports restful sleep.   Plan: Continue CPAP therapy. Continue to practice good sleep hygiene.  Will add in elements of an antiinflammatory diet and  add in elements of a Mediterranean diet.  Reduce intake of carbohydrates for weight loss.      Generalized Obesity: Current BMI BMI (Calculated): 34.83   Pharmacotherapy Plan Continue and refill  Mounjaro  12.5 mg SQ weekly  Brandi [name] is currently in the action stage of change. As such, her goal is to continue with weight loss efforts.  She has agreed to the Category 1 plan.  Exercise goals: Older adults should follow the adult guidelines. When older  adults cannot meet the adult guidelines, they should be as physically active as their abilities and conditions will allow.   Behavioral modification strategies: keep healthy foods in the home, weigh protein portions, measure portion sizes, and mindful eating.  Brandi [name] has agreed to follow-up with our clinic in 4 weeks.    Objective:   VITALS: Per patient if applicable, see vitals. GENERAL: Alert and in no acute distress. CARDIOPULMONARY: No increased WOB. Speaking in clear sentences.  PSYCH: Pleasant and cooperative. Speech normal rate and rhythm. Affect is appropriate. Insight and judgement are appropriate. Attention is focused, linear, and appropriate.  NEURO: Oriented as arrived to appointment on time with no prompting.   Attestation Statements:   This was prepared with the assistance of Engineer, civil (consulting).  Occasional wrong-word or sound-a-like substitutions may have occurred due to the inherent limitations of voice recognition   Brandi Peper, DO

## 2023-09-16 ENCOUNTER — Other Ambulatory Visit: Payer: Self-pay | Admitting: Physician Assistant

## 2023-09-16 DIAGNOSIS — I10 Essential (primary) hypertension: Secondary | ICD-10-CM

## 2023-09-22 NOTE — Progress Notes (Unsigned)
 Cardiology Office Note    Date:  09/24/2023  ID:  Lyrica, Mcclarty Mar 24, 1956, MRN [id number] PCP:  Georgina Speaks, FNP  Cardiologist:  Victory LELON Claudene DOUGLAS, MD (Inactive)  Electrophysiologist:  None   Chief Complaint: f/u BP, HF  History of Present Illness: .    Brandi [name] is a 68 y.o. female with visit-pertinent history of severe HTN, hypertensive heart disease, chronic HFpEF, multiple medication intolerances, significant bilateral LE edema, morbid obesity, anemia, arthritis, chronically elevated alk phos, GERD, RA, sciatica, OSA not using CPAP, pre-diabetes seen for follow-up. She was previously followed by Dr. Claudene. Echo 2020 was technically difficult but showed EF 55-60%, mildly increased LV septal wall thickness, G1DD, valves not well visualized. She has lost a significant amount of weight on GLP1 and no longer pre-diabetic. In 2023 Dr. Claudene reduced her atorvastatin  as patient wished to cut back on meds and LDL was well controlled. I saw her 06/2023 at which time her blood pressure was still elevated. We added trial of SGLT2i. 2D echo 07/2023 showed EF 60-65%, moderate LVh, mild RAE, normal diastolic function, trivial MR. After initiation of Farxiga , her chloride remained elevated and CO2 began to drop so we discontinued with plan for her to report back on BP readings. In retrospect she didn't like the way she felt on Farxiga .  She returns for follow-up today with her daughter who sees Dr. Sheena. She is doing great without any CP, SOB, palpitations, edema, or concerns. Blood pressures have been well controlled at home. She shares she has lost over 60lb on Monjouro.   Labwork independently reviewed: 07/2023 K 4.4, Cr 0.91, CO2 19 (improved from 17, Chloride 105 down from 108), alk phos 161, AST ALT OK 03/2023 A1C 5, K 4.6, Cr 0.98 11/2022 LDL 74, trig 61 by PCP 05/2022 CBC wnl 2022 TSH wnl  ROS: .    Please see the history of present illness.  All other systems are reviewed and  otherwise negative.  Studies Reviewed: SABRA    EKG:  EKG is not ordered today  CV Studies: Cardiac studies reviewed are outlined and summarized above. Otherwise please see EMR for full report.   Current Reported Medications:.    Current Meds  Medication Sig   amLODipine  (NORVASC ) 5 MG tablet Take 1.5 tablets (7.5 mg total) by mouth daily. Patient reaffirms only taking 1 tablet (5mg ) daily   atorvastatin  (LIPITOR) 20 MG tablet TAKE 1 TABLET(20 MG) BY MOUTH DAILY   Biotin 1000 MCG tablet Take 1,000 mcg by mouth every morning.   cetirizine (ZYRTEC) 10 MG tablet Take 10 mg by mouth every morning.   Cyanocobalamin (VITAMIN B-12 PO) Take 1 drop by mouth daily.   fluticasone (FLONASE) 50 MCG/ACT nasal spray 1 spray daily as needed for allergies or rhinitis.   gatifloxacin (ZYMAXID) 0.5 % SOLN Place 1 drop into the right eye 4 (four) times daily.   Magnesium 500 MG TABS Take 500 mg by mouth at bedtime.   meloxicam (MOBIC) 7.5 MG tablet Take 7.5 mg by mouth daily.   Multiple Vitamin (MULTIVITAMIN ADULT PO) Take 1 tablet by mouth daily.   mupirocin  ointment (BACTROBAN ) 2 % Apply 1 Application topically daily.   Nebivolol  HCl 20 MG TABS TAKE 1 TABLET BY MOUTH EVERY DAY   omeprazole (PRILOSEC OTC) 20 MG tablet Take 20 mg by mouth every morning.   ondansetron  (ZOFRAN ) 4 MG tablet Take 1 tablet (4 mg total) by mouth every 8 (eight) hours as needed for  nausea or vomiting.   OVER THE COUNTER MEDICATION Take 30 mg by mouth 2 (two) times daily. Strengtia 30 mg   OVER THE COUNTER MEDICATION Take 1 tablet by mouth 2 (two) times daily. Hepato - C1   OVER THE COUNTER MEDICATION Take 1 capsule by mouth 2 (two) times daily. Omega CO3-SE   prednisoLONE acetate (PRED FORTE) 1 % ophthalmic suspension Place 1 drop into the right eye 4 (four) times daily.   tirzepatide  (MOUNJARO ) 12.5 MG/0.5ML Pen Inject 12.5 mg into the skin once a week.   tizanidine  (ZANAFLEX ) 2 MG capsule TAKE 1 CAPSULE(2 MG) BY MOUTH THREE TIMES  DAILY AS NEEDED FOR MUSCLE SPASMS   topiramate  (TOPAMAX ) 50 MG tablet Take 1 tablet (50 mg total) by mouth 2 (two) times daily.   triamcinolone  ointment (KENALOG ) 0.1 % Apply 1 Application topically 2 (two) times daily. (Patient taking differently: Apply 1 Application topically 2 (two) times daily as needed.)   valsartan  (DIOVAN ) 160 MG tablet TAKE 1 TABLET(160 MG) BY MOUTH DAILY   Vitamin E 180 MG CAPS Take 180 mg by mouth every morning.    Physical Exam:    VS:  BP 118/76   Pulse 72   Ht 5' 3 (1.6 m)   Wt 204 lb (92.5 kg)   SpO2 98%   BMI 36.14 kg/m    Wt Readings from Last 3 Encounters:  09/24/23 204 lb (92.5 kg)  09/05/23 203 lb (92.1 kg)  07/30/23 206 lb (93.4 kg)    GEN: Well nourished, well developed in no acute distress NECK: No JVD; No carotid bruits CARDIAC: RRR, no murmurs, rubs, gallops RESPIRATORY:  Clear to auscultation without rales, wheezing or rhonchi  ABDOMEN: Soft, non-tender, non-distended EXTREMITIES:  No edema; No acute deformity   Asessement and Plan:.    1. Chronic HFpEF with HTN - appears euvolemic on exam. No longer on SGLT2i as above. She also didn't like the way she felt on it. Given clinical stability, no indication for further med adjustment at this time. We will update her amlodipine  rx to reflect she remains on 5mg  once daily (not 7.5mg  as rx suggests). Otherwise continue present regimen. She will continue to follow BP at home and notify if SBP begins trending 130 or above.  2. Systolic murmur, LVH - no significant valvular lesions on echo. Murmur less prominent today. LVH is likely sequelae of untreated OSA (does not wish to use CPAP) and longstanding HTN. Continue blood pressure control as above and follow for new symptoms.  3. Morbid obesity with OSA - continues to follow closely with the [facility].  4. Lipid management - due for lipids 11/2022. They would like to obtain through our office so will arrange f/u fasting lipids/LFTs in September.     Disposition: F/u with Dr. Sheena to establish care - requested 6 months from 06/2023 so either late Oct or sometime in Nov 2025.  Signed, Maloree Uplinger N Nawaal Alling, PA-C

## 2023-09-24 ENCOUNTER — Ambulatory Visit: Attending: Physician Assistant | Admitting: Physician Assistant

## 2023-09-24 ENCOUNTER — Encounter: Payer: Self-pay | Admitting: Physician Assistant

## 2023-09-24 VITALS — BP 118/76 | HR 72 | Ht 63.0 in | Wt 204.0 lb

## 2023-09-24 DIAGNOSIS — I517 Cardiomegaly: Secondary | ICD-10-CM

## 2023-09-24 DIAGNOSIS — R011 Cardiac murmur, unspecified: Secondary | ICD-10-CM | POA: Diagnosis not present

## 2023-09-24 DIAGNOSIS — G4733 Obstructive sleep apnea (adult) (pediatric): Secondary | ICD-10-CM | POA: Diagnosis not present

## 2023-09-24 DIAGNOSIS — Z136 Encounter for screening for cardiovascular disorders: Secondary | ICD-10-CM

## 2023-09-24 DIAGNOSIS — Z13220 Encounter for screening for lipoid disorders: Secondary | ICD-10-CM

## 2023-09-24 DIAGNOSIS — I5032 Chronic diastolic (congestive) heart failure: Secondary | ICD-10-CM | POA: Diagnosis not present

## 2023-09-24 MED ORDER — AMLODIPINE BESYLATE 5 MG PO TABS: 5.0000 mg | ORAL_TABLET | Freq: Every day | ORAL | 3 refills | Status: DC

## 2023-09-24 NOTE — Patient Instructions (Addendum)
 Medication Instructions:  CONTINUE AMLODIPINE  5MG  DAILY *If you need a refill on your cardiac medications before your next appointment, please call your pharmacy*  Lab Work: FASTING LIPID PANEL AND LFT IN SEPTEMBER 2025 If you have labs (blood work) drawn today and your tests are completely normal, you will receive your results only by: MyChart Message (if you have MyChart) OR A paper copy in the mail If you have any lab test that is abnormal or we need to change your treatment, we will call you to review the results.  Testing/Procedures: NO TESTING  Follow-Up: At [facility], you and your health needs are our priority.  As part of our continuing mission to provide you with exceptional heart care, our providers are all part of one team.  This team includes your primary Cardiologist (physician) and Advanced Practice Providers or APPs (Physician Assistants and Nurse Practitioners) who all work together to provide you with the care you need, when you need it.  Your next appointment:   4-5 month(s)  Provider:   Kardie Tobb, DO

## 2023-10-01 ENCOUNTER — Encounter: Payer: Medicare Other | Admitting: Nurse Practitioner

## 2023-10-07 IMAGING — MR MR HIP*L* W/O CM
6 series · 39 of 40 positions shown · non-contrast
Comparison: Left hip x-rays dated March 14, 2021.

CLINICAL DATA: Recurrent left hip pain and dislocation after prior
arthroplasty.

EXAM:
MR OF THE LEFT HIP WITHOUT CONTRAST
TECHNIQUE: Multiplanar, multisequence MR imaging was performed. No intravenous
contrast was administered.

[Series 3: STIR · axial · left · 5.0mm · 0.99mm/px · z∈[-76,+200]mm · 8 of 47 slices shown (1 of 2)]
[im 1/47]
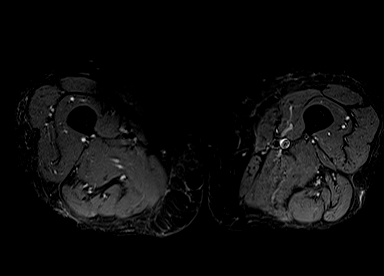
[im 7/47]
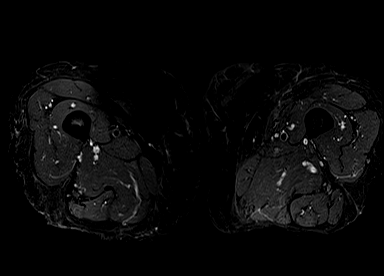
[im 14/47]
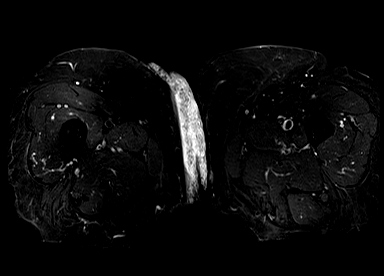
[im 20/47]
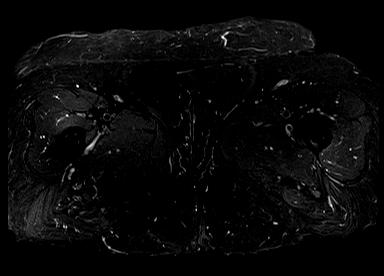
[im 27/47]
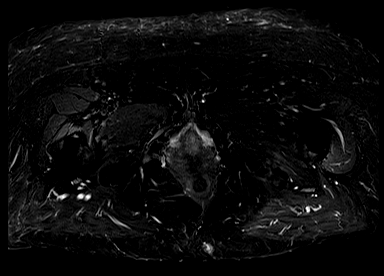
[im 33/47]
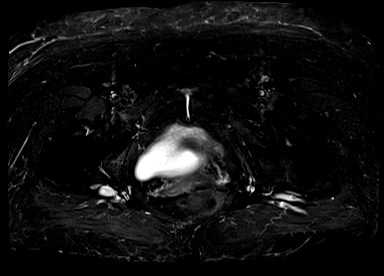
[im 40/47]
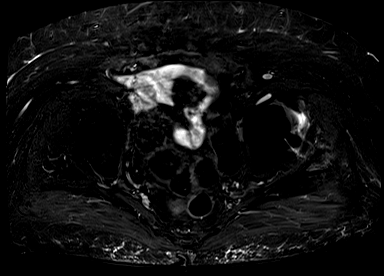
[im 47/47]
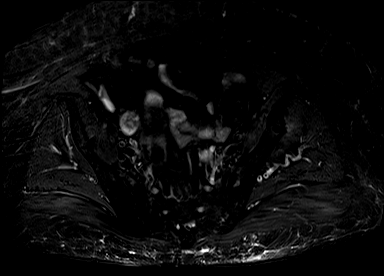

[Series 4: PD · axial · left · 5.0mm · 0.59mm/px · z∈[-56,+220]mm · 9 of 47 slices shown (1 of 3)]
[im 1/47]
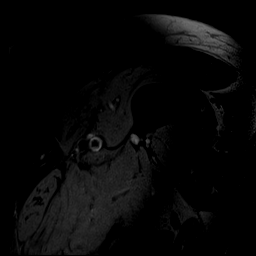
[im 6/47]
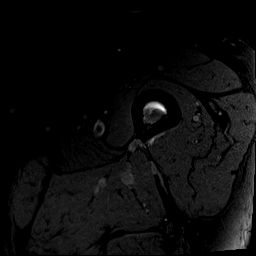
[im 12/47]
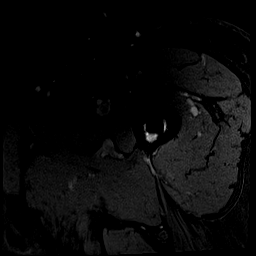
[im 18/47]
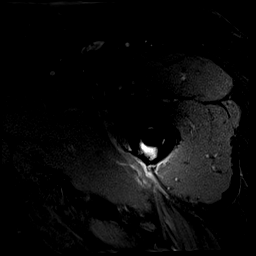
[im 24/47]
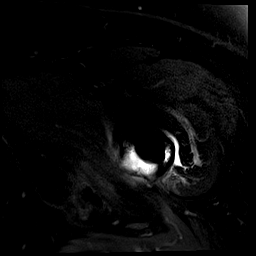
[im 29/47]
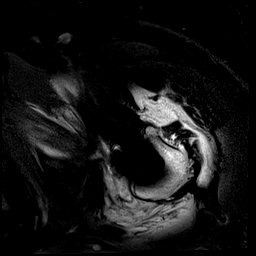
[im 35/47]
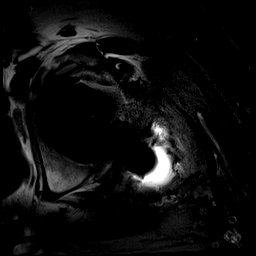
[im 41/47]
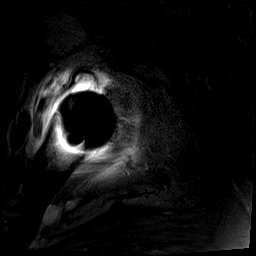
[im 47/47]
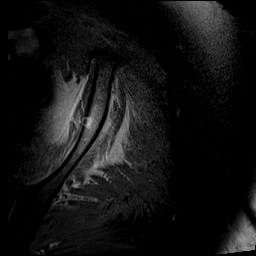

[Series 5: T1 · coronal · left · 4.0mm · 1.19mm/px · 6 of 30 slices shown]
[im 1/30]
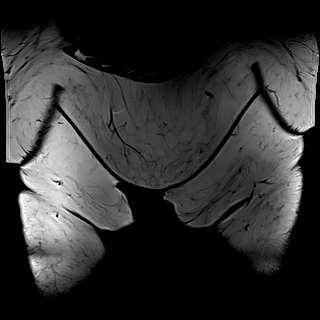
[im 6/30]
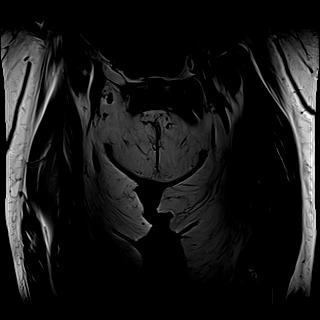
[im 12/30]
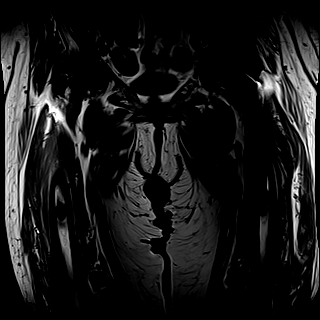
[im 18/30]
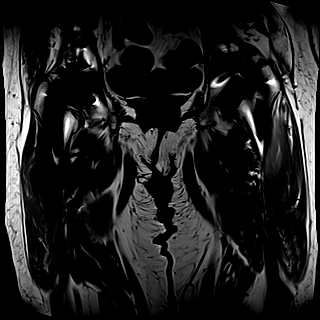
[im 24/30]
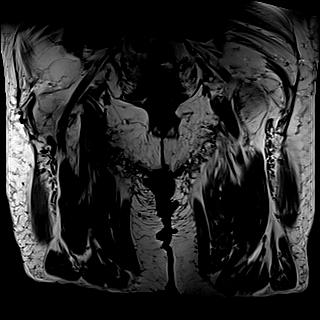
[im 30/30]
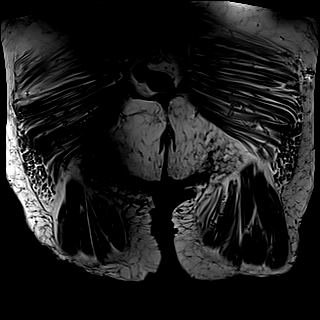

[Series 6: STIR · coronal · left · 4.0mm · 0.59mm/px · 5 of 30 slices shown (2 of 2)]
[im 1/30]
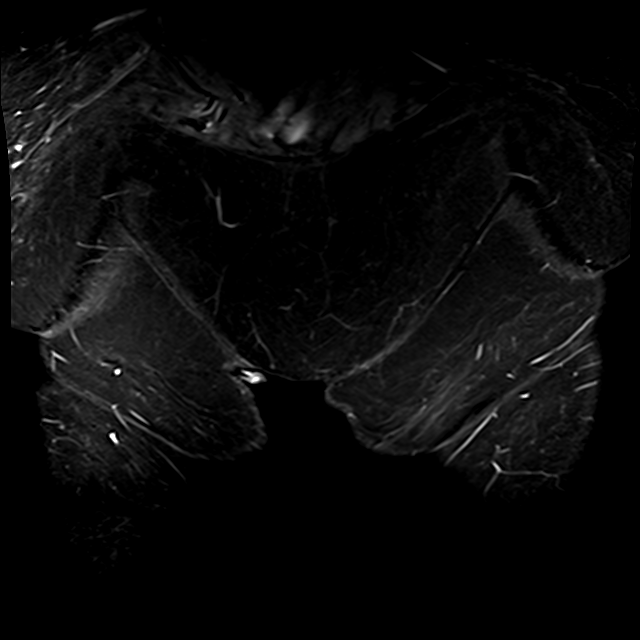
[im 6/30]
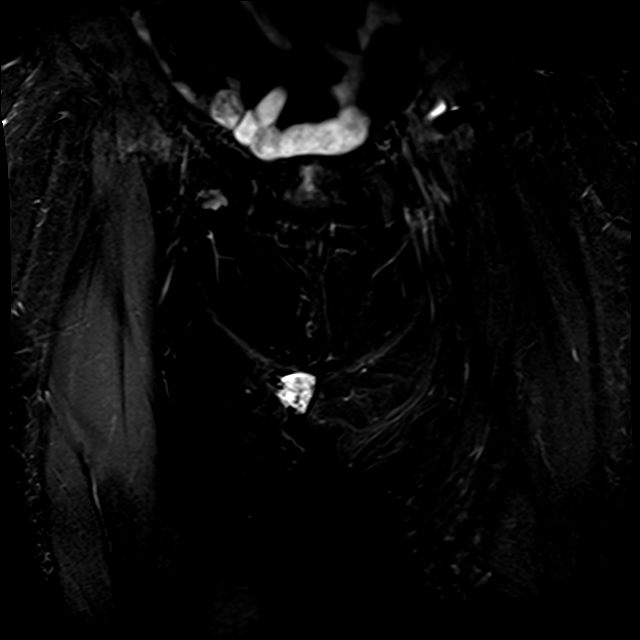
[im 12/30]
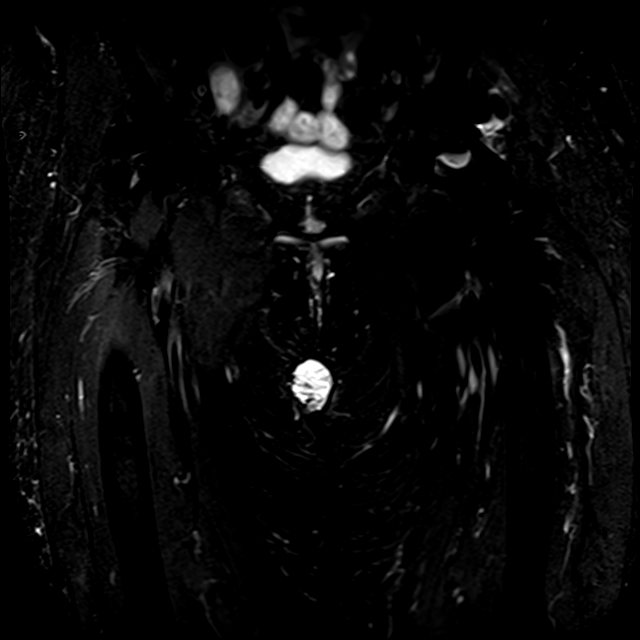
[im 18/30]
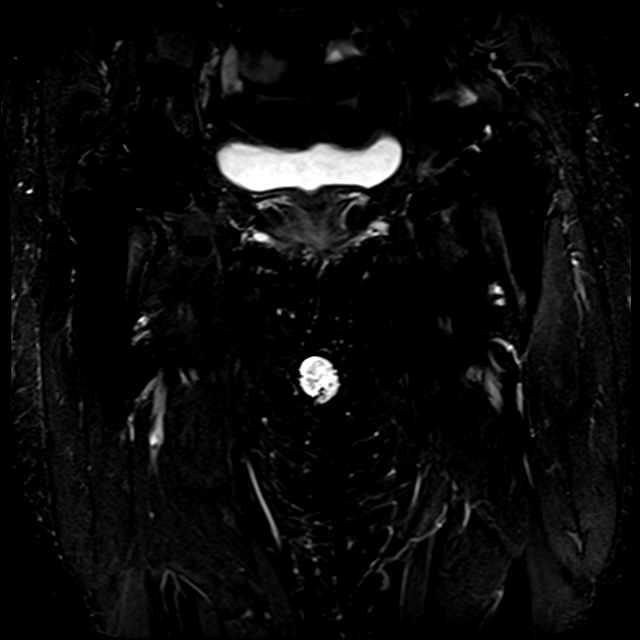
[im 24/30]
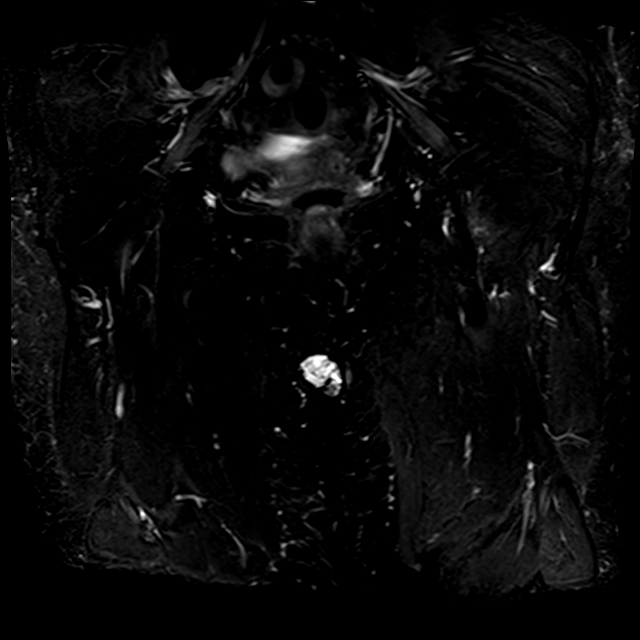

[Series 7: PD · coronal · left · 3.0mm · 1.04mm/px · 5 of 28 slices shown (2 of 3)]
[im 1/28]
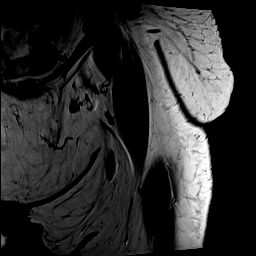
[im 7/28]
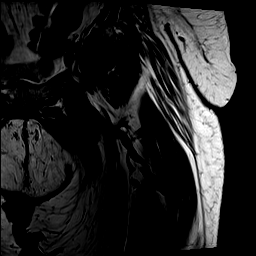
[im 14/28]
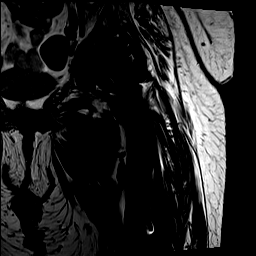
[im 21/28]
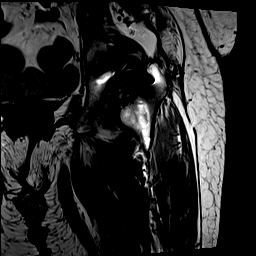
[im 28/28]
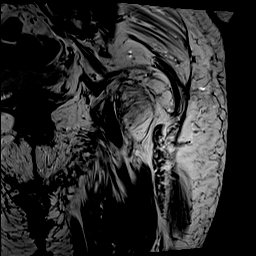

[Series 8: PD · sagittal · left · 3.5mm · 1.05mm/px · 6 of 32 slices shown (3 of 3)]
[im 1/32]
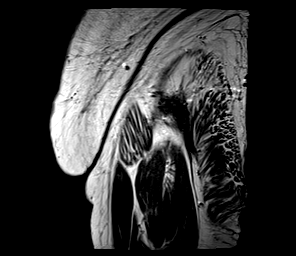
[im 7/32]
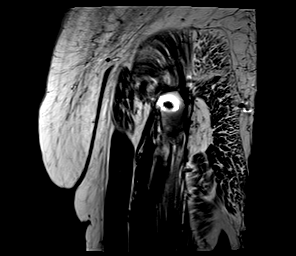
[im 13/32]
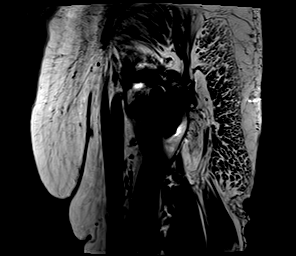
[im 19/32]
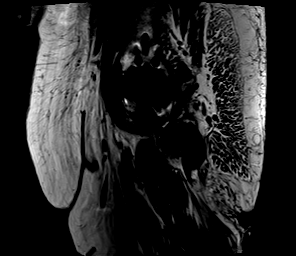
[im 25/32]
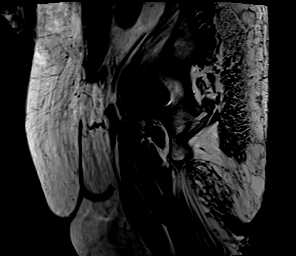
[im 32/32]
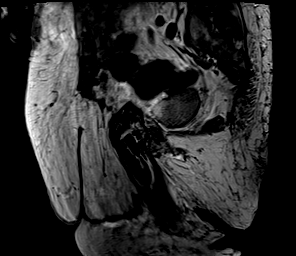

[39 of 40 positions shown; findings below may reference images not displayed]

FINDINGS: Bones: Prior bilateral total hip arthroplasties with associated
susceptibility artifact. No acute fracture or dislocation. No focal
bone lesion. Degenerative changes of the pubic symphysis with small
joint effusion. Mild degenerative changes of the bilateral
sacroiliac joints.

Joint or bursal effusion

Joint effusion: Small left hip joint effusion. No right hip joint
effusion.

Bursae: Small amount of fluid in the left iliopsoas bursa. No other
focal periarticular fluid collection.

Muscles and tendons

Muscles and tendons: Mild left hamstring origin tendinosis with
small partial tear. Mild left gluteus minimus and medius muscle
atrophy. Symmetric atrophy of the bilateral piriformis muscles. No
muscle edema.

Other findings

Miscellaneous: Prior hysterectomy. The visualized internal pelvic
contents appear unremarkable.
IMPRESSION: 1. Prior bilateral total hip arthroplasties. No acute abnormality.
2. Small left hip joint effusion.
3. Mild left iliopsoas bursitis.
4. Mild left hamstring origin tendinosis with small partial tear.

## 2023-10-09 ENCOUNTER — Encounter: Payer: Self-pay | Admitting: Bariatrics

## 2023-10-09 ENCOUNTER — Ambulatory Visit (INDEPENDENT_AMBULATORY_CARE_PROVIDER_SITE_OTHER): Admitting: Bariatrics

## 2023-10-09 ENCOUNTER — Other Ambulatory Visit (HOSPITAL_BASED_OUTPATIENT_CLINIC_OR_DEPARTMENT_OTHER): Payer: Self-pay

## 2023-10-09 VITALS — BP 136/79 | HR 64 | Temp 98.1°F | Ht 64.0 in | Wt 204.0 lb

## 2023-10-09 DIAGNOSIS — E669 Obesity, unspecified: Secondary | ICD-10-CM | POA: Diagnosis not present

## 2023-10-09 DIAGNOSIS — Z6835 Body mass index (BMI) 35.0-35.9, adult: Secondary | ICD-10-CM | POA: Diagnosis not present

## 2023-10-09 DIAGNOSIS — E119 Type 2 diabetes mellitus without complications: Secondary | ICD-10-CM | POA: Diagnosis not present

## 2023-10-09 DIAGNOSIS — Z7985 Long-term (current) use of injectable non-insulin antidiabetic drugs: Secondary | ICD-10-CM | POA: Diagnosis not present

## 2023-10-09 DIAGNOSIS — E785 Hyperlipidemia, unspecified: Secondary | ICD-10-CM | POA: Diagnosis not present

## 2023-10-09 DIAGNOSIS — E7849 Other hyperlipidemia: Secondary | ICD-10-CM

## 2023-10-09 DIAGNOSIS — E1169 Type 2 diabetes mellitus with other specified complication: Secondary | ICD-10-CM

## 2023-10-09 MED ORDER — TIRZEPATIDE 12.5 MG/0.5ML ~~LOC~~ SOAJ
12.5000 mg | SUBCUTANEOUS | 0 refills | Status: DC
  Filled 2023-10-09: qty 2, 28d supply, fill #0

## 2023-10-09 NOTE — Progress Notes (Signed)
 WEIGHT SUMMARY AND BIOMETRICS  Weight Lost Since Last Visit: 0  Weight Gained Since Last Visit: 1lb   Vitals Temp: 98.1 F (36.7 C) BP: 136/79 Pulse Rate: 64 SpO2: 100 %   Anthropometric Measurements Height: 5' 4 (1.626 m) Weight: 204 lb (92.5 kg) BMI (Calculated): 35 Weight at Last Visit: 203lb Weight Lost Since Last Visit: 0 Weight Gained Since Last Visit: 1lb Starting Weight: 273lb Total Weight Loss (lbs): 69 lb (31.3 kg)   Body Composition  Body Fat %: 51.1 % Fat Mass (lbs): 104.2 lbs Muscle Mass (lbs): 94.8 lbs Visceral Fat Rating : 15   Other Clinical Data Fasting: no Labs: no Today's Visit #: 33 Starting Date: 09/04/20    OBESITY Mahalia is here to discuss her progress with her obesity treatment plan along with follow-up of her obesity related diagnoses.    Nutrition Plan: the Category 1 plan - 40% adherence.  Current exercise: walking  Interim History:  She is up 1 pound since her last visit.  She states that she has been to [place] and has been eating more while on vacation. Eating all of the food on the plan., Protein intake is as prescribed, Is not skipping meals, Water intake is adequate., and Denies excessive cravings.   Pharmacotherapy: Bedelia is on Mounjaro  12.5 mg SQ weekly Adverse side effects: None Hunger is moderately controlled.  Cravings are moderately controlled.  Assessment/Plan:   Type II Diabetes HgbA1c is at goal. Last A1c was 5.0 CBGs: Not checking      Episodes of hypoglycemia: no Medication(s): Mounjaro  12.5 mg SQ weekly  Lab Results  Component Value Date   HGBA1C 5.0 04/16/2023   HGBA1C 5.2 12/12/2022   HGBA1C 5.1 06/20/2022   Lab Results  Component Value Date   MICROALBUR 10 03/11/2020   LDLCALC 74 12/12/2022   CREATININE 0.91 08/16/2023   No results found for: GFR  Plan: Continue and  refill Mounjaro  12.5 mg SQ weekly Will keep all carbohydrates low both sweets and starches.  Will continue exercise regimen to 30 to 60 minutes on most days of the week.  Aim for 7 to 9 hours of sleep nightly.  Eat more low glycemic index foods.   Hyperlipidemia LDL is not at goal. Medication(s): Lipitor Cardiovascular risk factors: diabetes mellitus, dyslipidemia, hypertension, obesity (BMI >= 30 kg/m2), and sedentary lifestyle  Lab Results  Component Value Date   CHOL 157 12/12/2022   HDL 71 12/12/2022   LDLCALC 74 12/12/2022   TRIG 61 12/12/2022   CHOLHDL 2.2 12/12/2022   Lab Results  Component Value Date   ALT 25 08/08/2023   AST 23 08/08/2023   ALKPHOS 161 (H) 08/08/2023   BILITOT 0.2 08/08/2023   The 10-year ASCVD risk score (Arnett DK, et al., 2019) is: 24.5%   Values used to calculate the score:     Age: 9  years     Clincally relevant sex: Female     Is Non-Hispanic African American: Yes     Diabetic: Yes     Tobacco smoker: No     Systolic Blood Pressure: 136 mmHg     Is BP treated: Yes     HDL Cholesterol: 71 mg/dL     Total Cholesterol: 157 mg/dL  Plan:  Continue statin.  Will avoid all trans fats.  Will read labels Will minimize saturated fats except the following: low fat meats in moderation, diary, and limited dark chocolate.  Discussed eating out. Discussed healthy vegetables and fruit.  Information sheets on healthy carbohydrates and protein.    Generalized Obesity: Current BMI BMI (Calculated): 35   Pharmacotherapy Plan Continue and refill  Mounjaro  12.5 mg SQ weekly  Chevy is currently in the action stage of change. As such, her goal is to continue with weight loss efforts.  She has agreed to the Category 1 plan.  Exercise goals: Older adults should determine their level of effort for physical activity relative to their level of fitness.   Behavioral modification strategies: increasing lean protein intake, decreasing simple carbohydrates ,  no meal skipping, decrease eating out, meal planning , increase water intake, better snacking choices, planning for success, avoiding temptations, keep healthy foods in the home, weigh protein portions, measure portion sizes, work on smaller portions, and mindful eating.  Davonne has agreed to follow-up with our clinic in 4 weeks.    Objective:   VITALS: Per patient if applicable, see vitals. GENERAL: Alert and in no acute distress. CARDIOPULMONARY: No increased WOB. Speaking in clear sentences.  PSYCH: Pleasant and cooperative. Speech normal rate and rhythm. Affect is appropriate. Insight and judgement are appropriate. Attention is focused, linear, and appropriate.  NEURO: Oriented as arrived to appointment on time with no prompting.   Attestation Statements:   This was prepared with the assistance of Engineer, civil (consulting).  Occasional wrong-word or sound-a-like substitutions may have occurred due to the inherent limitations of voice recognition   Clayborne Daring, DO

## 2023-11-08 ENCOUNTER — Other Ambulatory Visit (HOSPITAL_BASED_OUTPATIENT_CLINIC_OR_DEPARTMENT_OTHER): Payer: Self-pay

## 2023-11-08 ENCOUNTER — Ambulatory Visit (INDEPENDENT_AMBULATORY_CARE_PROVIDER_SITE_OTHER): Admitting: Bariatrics

## 2023-11-08 ENCOUNTER — Encounter: Payer: Self-pay | Admitting: Bariatrics

## 2023-11-08 VITALS — BP 121/80 | HR 60 | Temp 97.7°F | Ht 64.0 in | Wt 199.0 lb

## 2023-11-08 DIAGNOSIS — E1169 Type 2 diabetes mellitus with other specified complication: Secondary | ICD-10-CM

## 2023-11-08 DIAGNOSIS — I10 Essential (primary) hypertension: Secondary | ICD-10-CM

## 2023-11-08 DIAGNOSIS — E669 Obesity, unspecified: Secondary | ICD-10-CM | POA: Diagnosis not present

## 2023-11-08 DIAGNOSIS — Z6834 Body mass index (BMI) 34.0-34.9, adult: Secondary | ICD-10-CM | POA: Diagnosis not present

## 2023-11-08 DIAGNOSIS — Z7985 Long-term (current) use of injectable non-insulin antidiabetic drugs: Secondary | ICD-10-CM | POA: Diagnosis not present

## 2023-11-08 DIAGNOSIS — E119 Type 2 diabetes mellitus without complications: Secondary | ICD-10-CM

## 2023-11-08 DIAGNOSIS — E6609 Other obesity due to excess calories: Secondary | ICD-10-CM

## 2023-11-08 MED ORDER — TIRZEPATIDE 12.5 MG/0.5ML ~~LOC~~ SOAJ
12.5000 mg | SUBCUTANEOUS | 0 refills | Status: DC
  Filled 2023-11-08: qty 2, 28d supply, fill #0

## 2023-11-08 NOTE — Progress Notes (Signed)
 WEIGHT SUMMARY AND BIOMETRICS  Weight Lost Since Last Visit: 5lb  Weight Gained Since Last Visit: 0   Vitals Temp: 97.7 F (36.5 C) BP: 121/80 Pulse Rate: 60 SpO2: 99 %   Anthropometric Measurements Height: 5' 4 (1.626 m) Weight: 199 lb (90.3 kg) BMI (Calculated): 34.14 Weight at Last Visit: 204lb Weight Lost Since Last Visit: 5lb Weight Gained Since Last Visit: 0 Starting Weight: 273lb Total Weight Loss (lbs): 74 lb (33.6 kg)   Body Composition  Body Fat %: 49.9 % Fat Mass (lbs): 99.6 lbs Muscle Mass (lbs): 94.8 lbs Visceral Fat Rating : 15   Other Clinical Data Fasting: no Labs: no Today's Visit #: 61 Starting Date: 09/04/20    OBESITY Brandi [name] is here to discuss her progress with her obesity treatment plan along with follow-up of her obesity related diagnoses.    Nutrition Plan: the Category 1 plan - 90% adherence.  Current exercise: walking  Interim History:  She is down 5 lbs since her last visit.  She has done very well overall. She has been walking and going up stairs.  Eating all of the food on the plan., Protein intake is as prescribed, Is not skipping meals, and Water intake is adequate.   Pharmacotherapy: Brandi [name] is on Mounjaro  12.5 mg SQ weekly Adverse side effects: None Hunger is moderately controlled.  Cravings are moderately controlled.  Assessment/Plan:   Type II Diabetes HgbA1c is at goal. Last A1c was 5.0 CBGs: Not checking      Episodes of hypoglycemia: no Medication(s): Mounjaro  12.5 mg SQ weekly  Lab Results  Component Value Date   HGBA1C 5.0 04/16/2023   HGBA1C 5.2 12/12/2022   HGBA1C 5.1 06/20/2022   Lab Results  Component Value Date   MICROALBUR 10 03/11/2020   LDLCALC 74 12/12/2022   CREATININE 0.91 08/16/2023   No results found for: GFR  Plan: Continue and refill Mounjaro  12.5 mg SQ weekly Continue  all other medications.  Will keep all carbohydrates low both sweets and starches.  Will continue exercise regimen to 30 to 60 minutes on most days of the week.  Aim for 7 to 9 hours of sleep nightly.  Eat more low glycemic index foods.  Will continue to cook more at home and plan her meals.  Will continue to walk more.   Hypertension Hypertension well controlled, and Nebivolol .  Medication(s): Amlodipine  5 mg 1 daily   BP Readings from Last 3 Encounters:  11/08/23 121/80  10/09/23 136/79  09/24/23 118/76   Lab Results  Component Value Date   CREATININE 0.91 08/16/2023   CREATININE 0.89 08/08/2023   CREATININE 0.90 07/24/2023   No results found for: GFR  Plan: Continue all antihypertensives at current dosages. No added salt. Will keep sodium content to 1,500 mg or less per day.     Generalized Obesity: Current BMI BMI (Calculated): 34.14  Pharmacotherapy Plan Continue and refill  Mounjaro  12.5 mg SQ weekly  Brandi [name] is currently in the action stage of change. As such, her goal is to continue with weight loss efforts.  She has agreed to the Category 1 plan.  Exercise goals: Older adults should determine their level of effort for physical activity relative to their level of fitness.   Behavioral modification strategies: increasing lean protein intake, no meal skipping, meal planning , increase water intake, better snacking choices, planning for success, avoiding temptations, keep healthy foods in the home, weigh protein portions, and mindful eating.  Brandi [name] has agreed to follow-up with our clinic in 4 weeks.    Objective:   VITALS: Per patient if applicable, see vitals. GENERAL: Alert and in no acute distress. CARDIOPULMONARY: No increased WOB. Speaking in clear sentences.  PSYCH: Pleasant and cooperative. Speech normal rate and rhythm. Affect is appropriate. Insight and judgement are appropriate. Attention is focused, linear, and appropriate.  NEURO: Oriented as arrived  to appointment on time with no prompting.   Attestation Statements:   This was prepared with the assistance of Engineer, civil (consulting).  Occasional wrong-word or sound-a-like substitutions may have occurred due to the inherent limitations of voice recognition   Clayborne Daring, DO

## 2023-11-09 ENCOUNTER — Encounter: Payer: Self-pay | Admitting: Nurse Practitioner

## 2023-12-04 NOTE — Progress Notes (Signed)
 LILLETTE Kristeen JINNY Gladis, CMA,acting as a Neurosurgeon for Brandi Ada, FNP.,have documented all relevant documentation on the behalf of Brandi Ada, FNP,as directed by  Brandi Ada, FNP while in the presence of Brandi Ada, FNP.  Subjective:    Patient ID: Brandi [name] , female    DOB: 1956-02-20 , 68 y.o.   MRN: [id number]  Chief Complaint  Patient presents with   Annual Exam    Patient presents today for HM, Patient reports compliance with medication. Patient denies any chest pain, SOB, or headaches. Patient has no concerns today. Patient previously had EKG at cardiology.     HPI Discussed the use of AI scribe software for clinical note transcription with the patient, who gave verbal consent to proceed.  History of Present Illness Brandi [name] is a 68 year old female who presents for an annual physical exam.  She recently underwent cataract surgery on both eyes and is pleased with the outcome, as she no longer requires glasses.  She is due for a mammogram and a diabetic eye exam, and her Cologuard test for colon cancer screening will be due in December.  She continues to attend the weight management clinic and exercises three days a week. She visits her chiropractor weekly and reports improved balance and mobility, no longer needing her cane. She has been fall-free for a year.  Her blood sugar levels are well-controlled, with a recent reading of 88 mg/dL. She monitors her blood sugar and blood pressure daily, with recent blood pressure readings around 120/80 mmHg.  She recently received a flu shot and is considering the COVID-19 vaccine.  No issues with swallowing, constipation, diarrhea, or urinary problems. She performs self-breast exams regularly and plans to schedule a mammogram. She recently visited the dentist and reports no issues with her gums or oral health.   Diabetes She presents for her follow-up diabetic visit. She has type 2 diabetes mellitus. Her disease course has been  stable. Pertinent negatives for hypoglycemia include no dizziness or headaches. Pertinent negatives for diabetes include no blurred vision, no chest pain, no fatigue, no polydipsia, no polyphagia and no polyuria. Symptoms are stable. Diabetic complications include heart disease (congestive heart failure). Risk factors for coronary artery disease include dyslipidemia, obesity and sedentary lifestyle. Current diabetic treatment includes diet. She is compliant with treatment all of the time. Her weight is decreasing steadily. She has had a previous visit with a dietitian. She participates in exercise every other day. Home blood sugar record trend: fluctuating. (Blood sugar today was 82-118) An ACE inhibitor/angiotensin II receptor blocker is being taken. She does not see a podiatrist.Eye exam is not current (she is due to see the eye doctor next month).  Hypertension This is a chronic problem. The current episode started more than 1 year ago. The problem has been gradually improving since onset. The problem is uncontrolled. Pertinent negatives include no anxiety, blurred vision, chest pain, headaches or palpitations. Risk factors for coronary artery disease include obesity, dyslipidemia and sedentary lifestyle. Past treatments include calcium  channel blockers. There are no compliance problems.  There is no history of angina. There is no history of chronic renal disease.     Past Medical History:  Diagnosis Date   Allergy In chart   See chart   Anemia    Arthritis    Back pain    BMI 34.0-34.9,adult 06/19/2022   Cataract 10/24   For surgery in March &April   Class 2 obesity due to excess calories with body  mass index (BMI) of 35.0 to 35.9 in adult 04/16/2023   Class 3 severe obesity with serious comorbidity and body mass index (BMI) of 45.0 to 49.9 in adult 02/15/2022   Diabetes mellitus without complication (HCC)    Questionable   Disequilibrium 06/19/2022   Edema, lower extremity    Food allergy     GERD (gastroesophageal reflux disease)    Hypertension    Hypertensive crisis    Joint pain    Mobility impaired 03/17/2021   Morbid obesity (HCC)    Obesity with body mass index (BMI) of 35.0 to 39.9 without comorbidity 12/12/2022   Osteoporosis    Pre-diabetes    Rheumatoid arthritis (HCC)    Sciatica neuralgia    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   Sleep apnea      Family History  Problem Relation Age of Onset   Hypertension Mother    Cancer Mother    Heart disease Mother    Eating disorder Mother    Obesity Mother    Kidney disease Father    Heart disease Father    Diabetes Father    High blood pressure Father    High Cholesterol Father    Anxiety disorder Father    Bipolar disorder Father    Schizophrenia Father    Hypertension Sister      Current Outpatient Medications:    amLODipine  (NORVASC ) 5 MG tablet, Take 1 tablet (5 mg total) by mouth daily., Disp: 90 tablet, Rfl: 3   atorvastatin  (LIPITOR) 20 MG tablet, TAKE 1 TABLET(20 MG) BY MOUTH DAILY, Disp: 90 tablet, Rfl: 3   Biotin 1000 MCG tablet, Take 1,000 mcg by mouth every morning., Disp: , Rfl:    cetirizine (ZYRTEC) 10 MG tablet, Take 10 mg by mouth every morning., Disp: , Rfl:    Cyanocobalamin (VITAMIN B-12 PO), Take 1 drop by mouth daily., Disp: , Rfl:    fluticasone (FLONASE) 50 MCG/ACT nasal spray, 1 spray daily as needed for allergies or rhinitis., Disp: , Rfl:    gatifloxacin (ZYMAXID) 0.5 % SOLN, Place 1 drop into the right eye 4 (four) times daily., Disp: , Rfl:    Magnesium 500 MG TABS, Take 500 mg by mouth at bedtime., Disp: , Rfl:    meloxicam (MOBIC) 7.5 MG tablet, Take 7.5 mg by mouth daily., Disp: , Rfl:    Multiple Vitamin (MULTIVITAMIN ADULT PO), Take 1 tablet by mouth daily., Disp: , Rfl:    mupirocin  ointment (BACTROBAN ) 2 %, Apply 1 Application topically daily., Disp: 22 g, Rfl: 0   Nebivolol  HCl 20 MG TABS, TAKE 1 TABLET BY MOUTH EVERY DAY, Disp: 30 tablet, Rfl: 9    omeprazole (PRILOSEC OTC) 20 MG tablet, Take 20 mg by mouth every morning., Disp: , Rfl:    ondansetron  (ZOFRAN ) 4 MG tablet, Take 1 tablet (4 mg total) by mouth every 8 (eight) hours as needed for nausea or vomiting., Disp: 20 tablet, Rfl: 0   OVER THE COUNTER MEDICATION, Take 30 mg by mouth 2 (two) times daily. Strengtia 30 mg, Disp: , Rfl:    OVER THE COUNTER MEDICATION, Take 1 tablet by mouth 2 (two) times daily. Hepato - C1, Disp: , Rfl:    OVER THE COUNTER MEDICATION, Take 1 capsule by mouth 2 (two) times daily. Omega CO3-SE, Disp: , Rfl:    prednisoLONE acetate (PRED FORTE) 1 % ophthalmic suspension, Place 1 drop into the right eye 4 (four) times daily., Disp: ,  Rfl:    tizanidine  (ZANAFLEX ) 2 MG capsule, TAKE 1 CAPSULE(2 MG) BY MOUTH THREE TIMES DAILY AS NEEDED FOR MUSCLE SPASMS, Disp: 60 capsule, Rfl: 1   topiramate  (TOPAMAX ) 50 MG tablet, Take 1 tablet (50 mg total) by mouth 2 (two) times daily., Disp: 180 tablet, Rfl: 1   triamcinolone  ointment (KENALOG ) 0.1 %, Apply 1 Application topically 2 (two) times daily. (Patient taking differently: Apply 1 Application topically 2 (two) times daily as needed.), Disp: 30 g, Rfl: 2   valsartan  (DIOVAN ) 160 MG tablet, TAKE 1 TABLET(160 MG) BY MOUTH DAILY, Disp: 90 tablet, Rfl: 0   Vitamin E 180 MG CAPS, Take 180 mg by mouth every morning., Disp: , Rfl:    tirzepatide  (MOUNJARO ) 12.5 MG/0.5ML Pen, Inject 12.5 mg into the skin once a week., Disp: 2 mL, Rfl: 0   Allergies  Allergen Reactions   Hydralazine Itching   Meperidine Hcl Rash and Other (See Comments)    hallucinations   Aliskiren Hives   Aspirin Nausea Only   Juniper Berries Hives   Latex Hives   Rofecoxib Diarrhea    Reaction to Vioxx   Spironolactone  Other (See Comments)    High doses caused nausea and rash - may can tolerate low doses   Chocolate Nausea And Vomiting and Rash   Furosemide Other (See Comments)    Rash and extreme dehydration   Oxycodone-Acetaminophen Itching and  Rash   Sulfa Antibiotics Rash      The patient states she uses status post hysterectomy for birth control. No LMP recorded. Patient has had a hysterectomy.  Negative for Dysmenorrhea and Negative for Menorrhagia. Negative for: breast discharge, breast lump(s), breast pain and breast self exam. Associated symptoms include abnormal vaginal bleeding. Pertinent negatives include abnormal bleeding (hematology), anxiety, decreased libido, depression, difficulty falling sleep, dyspareunia, history of infertility, nocturia, sexual dysfunction, sleep disturbances, urinary incontinence, urinary urgency, vaginal discharge and vaginal itching. Diet regular.  Continues at healthy weight and wellness. The patient states her exercise level is 4 days a week.   The patient's tobacco use is:  Social History   Tobacco Use  Smoking Status Former   Current packs/day: 0.00   Average packs/day: 0.3 packs/day for 6.0 years (1.6 ttl pk-yrs)   Types: Cigarettes   Quit date: 06/09/1988   Years since quitting: 35.5  Smokeless Tobacco Never  Tobacco Comments   Stop along time ago  She has been exposed to passive smoke. The patient's alcohol use is:  Social History   Substance and Sexual Activity  Alcohol Use Not Currently    Review of Systems  Constitutional: Negative.  Negative for fatigue.  HENT: Negative.    Eyes: Negative.  Negative for blurred vision.  Respiratory: Negative.    Cardiovascular: Negative.  Negative for chest pain and palpitations.  Gastrointestinal: Negative.   Endocrine: Negative.  Negative for polydipsia, polyphagia and polyuria.  Genitourinary: Negative.   Musculoskeletal: Negative.   Skin: Negative.   Allergic/Immunologic: Negative.   Neurological: Negative.  Negative for dizziness and headaches.  Hematological: Negative.   Psychiatric/Behavioral: Negative.       Today's Vitals   12/05/23 1001  BP: 120/80  Pulse: 83  Temp: 98.9 F (37.2 C)  TempSrc: Oral  Weight: 196 lb  (88.9 kg)  Height: 5' 4 (1.626 m)  PainSc: 0-No pain   Body mass index is 33.64 kg/m.  Wt Readings from Last 3 Encounters:  12/13/23 202 lb (91.6 kg)  12/05/23 196 lb (88.9 kg)  11/08/23 199  lb (90.3 kg)     Objective:  Physical Exam Vitals and nursing note reviewed.  Constitutional:      General: She is not in acute distress.    Appearance: Normal appearance. She is well-developed. She is obese.     Comments: Central obesity  HENT:     Head: Normocephalic and atraumatic.     Right Ear: Hearing, tympanic membrane, ear canal and external ear normal. There is no impacted cerumen.     Left Ear: Hearing, tympanic membrane, ear canal and external ear normal. There is no impacted cerumen.     Nose: Nose normal.     Mouth/Throat:     Mouth: Mucous membranes are moist.  Eyes:     General: Lids are normal.     Extraocular Movements: Extraocular movements intact.     Conjunctiva/sclera: Conjunctivae normal.     Pupils: Pupils are equal, round, and reactive to light.     Funduscopic exam:    Right eye: No papilledema.        Left eye: No papilledema.  Neck:     Thyroid : No thyroid  mass.     Vascular: No carotid bruit.  Cardiovascular:     Rate and Rhythm: Normal rate and regular rhythm.     Pulses: Normal pulses.     Heart sounds: Normal heart sounds. No murmur heard. Pulmonary:     Effort: Pulmonary effort is normal. No respiratory distress.     Breath sounds: Normal breath sounds. No wheezing.  Chest:     Chest wall: No mass.  Breasts:    Tanner Score is 5.     Right: Normal. No mass or tenderness.     Left: Normal. No mass or tenderness.  Abdominal:     General: Abdomen is flat. Bowel sounds are normal. There is no distension.     Palpations: Abdomen is soft.     Tenderness: There is no abdominal tenderness.  Genitourinary:    Rectum: Guaiac result negative.  Musculoskeletal:        General: No swelling or tenderness. Normal range of motion.     Cervical back: Full  passive range of motion without pain, normal range of motion and neck supple.     Right lower leg: No edema.     Left lower leg: No edema.  Lymphadenopathy:     Upper Body:     Right upper body: No supraclavicular, axillary or pectoral adenopathy.     Left upper body: No supraclavicular, axillary or pectoral adenopathy.  Skin:    General: Skin is warm and dry.     Capillary Refill: Capillary refill takes less than 2 seconds.  Neurological:     General: No focal deficit present.     Mental Status: She is alert and oriented to person, place, and time.     Cranial Nerves: No cranial nerve deficit.     Sensory: No sensory deficit.     Motor: No weakness.  Psychiatric:        Mood and Affect: Mood normal.        Behavior: Behavior normal.        Thought Content: Thought content normal.        Judgment: Judgment normal.      Diabetic foot exam was performed with the following findings:   No deformities, ulcerations, or other skin breakdown Normal sensation of 10g monofilament Intact posterior tibialis and dorsalis pedis pulses     Assessment And Plan:  Encounter for annual health examination Assessment & Plan: Blood pressure controlled. Vision improved post-cataract surgery. No urinary or gastrointestinal issues. - Perform physical examination including foot check. - Review blood pressure readings. - Ensure regular follow-up with weight management clinic.   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 - Continue current antihypertensive medication regimen. - Monitor blood pressure regularly.  Orders: -     POCT URINALYSIS DIP (CLINITEK) -     Microalbumin / creatinine urine ratio -     CMP14+EGFR  Type 2 diabetes mellitus with hyperlipidemia (HCC) Assessment & Plan: Blood sugar levels well-controlled at 88 mg/dL. Daily monitoring of blood sugar and blood pressure. - Check A1c to  assess diabetes control. - foot exam done  Orders: -     Hemoglobin A1c -     Lipid panel  Vitamin D  deficiency Assessment & Plan: Will check vitamin D  level and supplement as needed.    Also encouraged to spend 15 minutes in the sun daily.    Orders: -     VITAMIN D  25 Hydroxy (Vit-D Deficiency, Fractures)  Class 1 obesity due to excess calories with body mass index (BMI) of 33.0 to 33.9 in adult, unspecified whether serious comorbidity present Assessment & Plan: Attending weight management clinic. Exercising regularly. Ongoing weight management. - Continue attending weight management clinic. - Maintain regular exercise routine.   Need for influenza vaccination Assessment & Plan: Influenza vaccine administered Encouraged to take Tylenol as needed for fever or muscle aches.   Orders: -     Flu vaccine HIGH DOSE PF(Fluzone Trivalent)  COVID-19 vaccination declined  Other long term (current) drug therapy -     CBC with Differential/Platelet  Obstructive sleep apnea syndrome Assessment & Plan: Regular management of comorbid conditions maintained.   CPAP (continuous positive airway pressure) dependence  Colon cancer screening -     Cologuard; Future     Return for 1 year physical, controlled DM check 4 months. Patient was given opportunity to ask questions. Patient verbalized understanding of the plan and was able to repeat key elements of the plan. All questions were answered to their satisfaction.   Brandi Ada, FNP  I, Brandi Ada, FNP, have reviewed all documentation for this visit. The documentation on 12/05/23 for the exam, diagnosis, procedures, and orders are all accurate and complete.

## 2023-12-05 ENCOUNTER — Ambulatory Visit (INDEPENDENT_AMBULATORY_CARE_PROVIDER_SITE_OTHER): Admitting: Nurse Practitioner

## 2023-12-05 ENCOUNTER — Encounter: Payer: Self-pay | Admitting: Nurse Practitioner

## 2023-12-05 VITALS — BP 120/80 | HR 83 | Temp 98.9°F | Ht 64.0 in | Wt 196.0 lb

## 2023-12-05 DIAGNOSIS — Z1211 Encounter for screening for malignant neoplasm of colon: Secondary | ICD-10-CM | POA: Diagnosis not present

## 2023-12-05 DIAGNOSIS — E785 Hyperlipidemia, unspecified: Secondary | ICD-10-CM | POA: Diagnosis not present

## 2023-12-05 DIAGNOSIS — I110 Hypertensive heart disease with heart failure: Secondary | ICD-10-CM

## 2023-12-05 DIAGNOSIS — Z2821 Immunization not carried out because of patient refusal: Secondary | ICD-10-CM

## 2023-12-05 DIAGNOSIS — Z0000 Encounter for general adult medical examination without abnormal findings: Secondary | ICD-10-CM

## 2023-12-05 DIAGNOSIS — E6609 Other obesity due to excess calories: Secondary | ICD-10-CM

## 2023-12-05 DIAGNOSIS — G4733 Obstructive sleep apnea (adult) (pediatric): Secondary | ICD-10-CM | POA: Diagnosis not present

## 2023-12-05 DIAGNOSIS — Z6833 Body mass index (BMI) 33.0-33.9, adult: Secondary | ICD-10-CM

## 2023-12-05 DIAGNOSIS — I5030 Unspecified diastolic (congestive) heart failure: Secondary | ICD-10-CM

## 2023-12-05 DIAGNOSIS — Z79899 Other long term (current) drug therapy: Secondary | ICD-10-CM | POA: Diagnosis not present

## 2023-12-05 DIAGNOSIS — E559 Vitamin D deficiency, unspecified: Secondary | ICD-10-CM

## 2023-12-05 DIAGNOSIS — E66811 Obesity, class 1: Secondary | ICD-10-CM

## 2023-12-05 DIAGNOSIS — Z23 Encounter for immunization: Secondary | ICD-10-CM | POA: Diagnosis not present

## 2023-12-05 DIAGNOSIS — E1169 Type 2 diabetes mellitus with other specified complication: Secondary | ICD-10-CM | POA: Diagnosis not present

## 2023-12-05 DIAGNOSIS — Z9989 Dependence on other enabling machines and devices: Secondary | ICD-10-CM

## 2023-12-05 LAB — POCT URINALYSIS DIP (CLINITEK)
Bilirubin, UA: NEGATIVE
Blood, UA: NEGATIVE
Glucose, UA: NEGATIVE mg/dL
Ketones, POC UA: NEGATIVE mg/dL
Leukocytes, UA: NEGATIVE
Nitrite, UA: NEGATIVE
POC PROTEIN,UA: NEGATIVE
Spec Grav, UA: 1.01 (ref 1.010–1.025)
Urobilinogen, UA: 0.2 U/dL
pH, UA: 6 (ref 5.0–8.0)

## 2023-12-06 ENCOUNTER — Ambulatory Visit: Payer: Self-pay | Admitting: Physician Assistant

## 2023-12-06 LAB — CMP14+EGFR
ALT: 26 IU/L (ref 0–32)
AST: 27 IU/L (ref 0–40)
Albumin: 4.2 g/dL (ref 3.9–4.9)
Alkaline Phosphatase: 170 IU/L — ABNORMAL HIGH (ref 44–121)
BUN/Creatinine Ratio: 16 (ref 12–28)
BUN: 14 mg/dL (ref 8–27)
Bilirubin Total: 0.3 mg/dL (ref 0.0–1.2)
CO2: 17 mmol/L — ABNORMAL LOW (ref 20–29)
Calcium: 9.7 mg/dL (ref 8.7–10.3)
Chloride: 108 mmol/L — ABNORMAL HIGH (ref 96–106)
Creatinine, Ser: 0.89 mg/dL (ref 0.57–1.00)
Globulin, Total: 2.7 g/dL (ref 1.5–4.5)
Glucose: 85 mg/dL (ref 70–99)
Potassium: 4 mmol/L (ref 3.5–5.2)
Sodium: 140 mmol/L (ref 134–144)
Total Protein: 6.9 g/dL (ref 6.0–8.5)
eGFR: 70 mL/min/1.73 (ref >59)

## 2023-12-06 LAB — MICROALBUMIN / CREATININE URINE RATIO
Creatinine, Urine: 39.2 mg/dL
Microalb/Creat Ratio: <8 mg/g{creat} (ref 0–29)
Microalbumin, Urine: <3 ug/mL

## 2023-12-06 LAB — CBC WITH DIFFERENTIAL/PLATELET
Basophils Absolute: 0 x10E3/uL (ref 0.0–0.2)
Basos: 1 %
EOS (ABSOLUTE): 0 x10E3/uL (ref 0.0–0.4)
Eos: 0 %
Hematocrit: 39.6 % (ref 34.0–46.6)
Hemoglobin: 12.8 g/dL (ref 11.1–15.9)
Immature Grans (Abs): 0 x10E3/uL (ref 0.0–0.1)
Immature Granulocytes: 0 %
Lymphocytes Absolute: 2.2 x10E3/uL (ref 0.7–3.1)
Lymphs: 48 %
MCH: 30.8 pg (ref 26.6–33.0)
MCHC: 32.3 g/dL (ref 31.5–35.7)
MCV: 95 fL (ref 79–97)
Monocytes Absolute: 0.3 x10E3/uL (ref 0.1–0.9)
Monocytes: 6 %
Neutrophils Absolute: 2 x10E3/uL (ref 1.4–7.0)
Neutrophils: 45 %
Platelets: 216 x10E3/uL (ref 150–450)
RBC: 4.15 x10E6/uL (ref 3.77–5.28)
RDW: 13.2 % (ref 11.7–15.4)
WBC: 4.6 x10E3/uL (ref 3.4–10.8)

## 2023-12-06 LAB — HEMOGLOBIN A1C
Est. average glucose Bld gHb Est-mCnc: 103 mg/dL
Hgb A1c MFr Bld: 5.2 % (ref 4.8–5.6)

## 2023-12-06 LAB — LIPID PANEL
Chol/HDL Ratio: 2.5 ratio (ref 0.0–4.4)
Cholesterol, Total: 145 mg/dL (ref 100–199)
HDL: 57 mg/dL (ref >39)
LDL Chol Calc (NIH): 75 mg/dL (ref 0–99)
Triglycerides: 66 mg/dL (ref 0–149)
VLDL Cholesterol Cal: 13 mg/dL (ref 5–40)

## 2023-12-06 LAB — VITAMIN D 25 HYDROXY (VIT D DEFICIENCY, FRACTURES): Vit D, 25-Hydroxy: 36.7 ng/mL (ref 30.0–100.0)

## 2023-12-13 ENCOUNTER — Ambulatory Visit: Admitting: Bariatrics

## 2023-12-13 ENCOUNTER — Encounter: Payer: Self-pay | Admitting: Bariatrics

## 2023-12-13 ENCOUNTER — Other Ambulatory Visit (HOSPITAL_BASED_OUTPATIENT_CLINIC_OR_DEPARTMENT_OTHER): Payer: Self-pay

## 2023-12-13 VITALS — BP 143/86 | HR 69 | Temp 98.0°F | Ht 64.0 in | Wt 202.0 lb

## 2023-12-13 DIAGNOSIS — E1169 Type 2 diabetes mellitus with other specified complication: Secondary | ICD-10-CM

## 2023-12-13 DIAGNOSIS — Z7985 Long-term (current) use of injectable non-insulin antidiabetic drugs: Secondary | ICD-10-CM

## 2023-12-13 DIAGNOSIS — I10 Essential (primary) hypertension: Secondary | ICD-10-CM

## 2023-12-13 DIAGNOSIS — E669 Obesity, unspecified: Secondary | ICD-10-CM

## 2023-12-13 DIAGNOSIS — E6609 Other obesity due to excess calories: Secondary | ICD-10-CM

## 2023-12-13 DIAGNOSIS — E119 Type 2 diabetes mellitus without complications: Secondary | ICD-10-CM | POA: Diagnosis not present

## 2023-12-13 DIAGNOSIS — Z6834 Body mass index (BMI) 34.0-34.9, adult: Secondary | ICD-10-CM | POA: Diagnosis not present

## 2023-12-13 MED ORDER — TIRZEPATIDE 12.5 MG/0.5ML ~~LOC~~ SOAJ
12.5000 mg | SUBCUTANEOUS | 0 refills | Status: DC
  Filled 2023-12-13 – 2024-01-01 (×2): qty 2, 28d supply, fill #0

## 2023-12-13 NOTE — Progress Notes (Signed)
 WEIGHT SUMMARY AND BIOMETRICS  Weight Lost Since Last Visit: 0  Weight Gained Since Last Visit: 3lb   Vitals Temp: 98 F (36.7 C) BP: (!) 151/91 Pulse Rate: 69 SpO2: 98 %   Anthropometric Measurements Height: 5' 4 (1.626 m) Weight: 202 lb (91.6 kg) BMI (Calculated): 34.66 Weight at Last Visit: 199lb Weight Lost Since Last Visit: 0 Weight Gained Since Last Visit: 3lb Starting Weight: 273lb Total Weight Loss (lbs): 71 lb (32.2 kg)   Body Composition  Body Fat %: 50.8 % Fat Mass (lbs): 103 lbs Muscle Mass (lbs): 94.8 lbs Visceral Fat Rating : 15   Other Clinical Data Fasting: no Labs: no Today's Visit #: 40 Starting Date: 09/04/20    OBESITY Brandi [name] is here to discuss her progress with her obesity treatment plan along with follow-up of her obesity related diagnoses.    Nutrition Plan: the Category 1 plan - 70% adherence.  Current exercise: walking  Interim History:  She is up 3 lbs since her last visit. She has gone to several celebrations. She needs a plan for the holidays.  Eating all of the food on the plan., Protein intake is as prescribed, Is not skipping meals, Not journaling consistently., and Water intake is adequate.   Pharmacotherapy: Brandi [name] is on Mounjaro  12.5 mg SQ weekly Adverse side effects: None Hunger is moderately controlled.  Cravings are moderately controlled.  Assessment/Plan:   Type II Diabetes HgbA1c is at goal. Last A1c was 5.2 CBGs: Not checking      Episodes of hypoglycemia: no Medication(s): Mounjaro  12.5 mg SQ weekly  Lab Results  Component Value Date   HGBA1C 5.2 12/05/2023   HGBA1C 5.0 04/16/2023   HGBA1C 5.2 12/12/2022   Lab Results  Component Value Date   MICROALBUR 10 03/11/2020   LDLCALC 75 12/05/2023   CREATININE 0.89 12/05/2023   No results found for: GFR  Plan: Continue and refill Mounjaro   12.5 mg SQ weekly Continue all other medications.  Will keep all carbohydrates low both sweets and starches.  Will continue exercise regimen to 30 to 60 minutes on most days of the week.  Aim for 7 to 9 hours of sleep nightly.  Eat more low glycemic index foods.    Hypertension Hypertension control uncertain.  She was doing some exercise before her blood pressure here. She Is checking her blood pressure at home.  Medication(s): Amlodipine  5 mg 1 daily  and Diovan  160 mg and Nebivolol  20 mg.   BP Readings from Last 3 Encounters:  12/13/23 (!) 143/86  12/05/23 120/80  11/08/23 121/80   Lab Results  Component Value Date   CREATININE 0.89 12/05/2023   CREATININE 0.91 08/16/2023   CREATININE 0.89 08/08/2023   No results found for: GFR  Plan: Continue all antihypertensives at current dosages. No added salt. Will keep sodium content to 1,500 mg or less per day.  Generalized Obesity: Current BMI BMI (Calculated): 34.66   Pharmacotherapy Plan Continue and refill  Mounjaro  12.5 mg SQ weekly  Brandi [name] is currently in the action stage of change. As such, her goal is to continue with weight loss efforts.  She has agreed to the Category 1 plan.  Exercise goals: Older adults should follow the adult guidelines. When older adults cannot meet the adult guidelines, they should be as physically active as their abilities and conditions will allow.  She will start some light weights and wrist weights.   Behavioral modification strategies: increasing lean protein intake, decreasing simple carbohydrates , decrease eating out, meal planning , decrease liquid calories, better snacking choices, planning for success, increasing vegetables, decreasing sodium intake, avoiding temptations, keep healthy foods in the home, travel eating strategies, holiday eating strategies , weigh protein portions, and mindful eating.  Brandi [name] has agreed to follow-up with our clinic in 4 weeks.    Objective:    VITALS: Per patient if applicable, see vitals. GENERAL: Alert and in no acute distress. CARDIOPULMONARY: No increased WOB. Speaking in clear sentences.  PSYCH: Pleasant and cooperative. Speech normal rate and rhythm. Affect is appropriate. Insight and judgement are appropriate. Attention is focused, linear, and appropriate.  NEURO: Oriented as arrived to appointment on time with no prompting.   Attestation Statements:   This was prepared with the assistance of Engineer, civil (consulting).  Occasional wrong-word or sound-a-like substitutions may have occurred due to the inherent limitations of voice recognition   Clayborne Daring, DO

## 2023-12-16 ENCOUNTER — Other Ambulatory Visit: Payer: Self-pay | Admitting: Physician Assistant

## 2023-12-16 ENCOUNTER — Encounter: Payer: Self-pay | Admitting: Nurse Practitioner

## 2023-12-16 DIAGNOSIS — Z0000 Encounter for general adult medical examination without abnormal findings: Secondary | ICD-10-CM | POA: Insufficient documentation

## 2023-12-16 DIAGNOSIS — Z1211 Encounter for screening for malignant neoplasm of colon: Secondary | ICD-10-CM | POA: Insufficient documentation

## 2023-12-16 DIAGNOSIS — E66811 Obesity, class 1: Secondary | ICD-10-CM | POA: Insufficient documentation

## 2023-12-16 DIAGNOSIS — I10 Essential (primary) hypertension: Secondary | ICD-10-CM

## 2023-12-16 NOTE — Assessment & Plan Note (Addendum)
 Blood sugar levels well-controlled at 88 mg/dL. Daily monitoring of blood sugar and blood pressure. - Check A1c to assess diabetes control. - foot exam done

## 2023-12-16 NOTE — Assessment & Plan Note (Signed)
 Blood pressure controlled. Vision improved post-cataract surgery. No urinary or gastrointestinal issues. - Perform physical examination including foot check. - Review blood pressure readings. - Ensure regular follow-up with weight management clinic.

## 2023-12-16 NOTE — Assessment & Plan Note (Signed)
 Will check vitamin D  level and supplement as needed.    Also encouraged to spend 15 minutes in the sun daily.

## 2023-12-16 NOTE — Assessment & Plan Note (Signed)
 Regular management of comorbid conditions maintained.

## 2023-12-16 NOTE — Assessment & Plan Note (Signed)
 Influenza vaccine administered Encouraged to take Tylenol as needed for fever or muscle aches.

## 2023-12-16 NOTE — Assessment & Plan Note (Signed)
 Blood pressure well-controlled at 120/80 mmHg. - Continue current antihypertensive medication regimen. - Monitor blood pressure regularly.

## 2023-12-16 NOTE — Assessment & Plan Note (Signed)
 Attending weight management clinic. Exercising regularly. Ongoing weight management. - Continue attending weight management clinic. - Maintain regular exercise routine.

## 2023-12-16 NOTE — Patient Instructions (Signed)
 Health Maintenance  Topic Date Due   Eye exam for diabetics  Never done   COVID-19 Vaccine (8 - Pfizer risk 2024-25 season) 12/21/2023*   Cologuard (Stool DNA test)  03/24/2024   Medicare Annual Wellness Visit  05/01/2024   Hemoglobin A1C  06/03/2024   Yearly kidney function blood test for diabetes  12/04/2024   Yearly kidney health urinalysis for diabetes  12/04/2024   Complete foot exam   12/04/2024   Breast Cancer Screening  12/26/2024   DTaP/Tdap/Td vaccine (2 - Tdap) 07/01/2031   Pneumococcal Vaccine for age over 52  Completed   Flu Shot  Completed   DEXA scan (bone density measurement)  Completed   Hepatitis C Screening  Completed   Zoster (Shingles) Vaccine  Completed   HPV Vaccine  Aged Out   Meningitis B Vaccine  Aged Out  *Topic was postponed. The date shown is not the original due date.

## 2023-12-24 ENCOUNTER — Other Ambulatory Visit (HOSPITAL_BASED_OUTPATIENT_CLINIC_OR_DEPARTMENT_OTHER): Payer: Self-pay

## 2023-12-27 ENCOUNTER — Other Ambulatory Visit: Payer: Self-pay | Admitting: Nurse Practitioner

## 2023-12-27 DIAGNOSIS — Z1231 Encounter for screening mammogram for malignant neoplasm of breast: Secondary | ICD-10-CM

## 2024-01-01 ENCOUNTER — Ambulatory Visit: Attending: Cardiology | Admitting: Cardiology

## 2024-01-01 ENCOUNTER — Other Ambulatory Visit (HOSPITAL_BASED_OUTPATIENT_CLINIC_OR_DEPARTMENT_OTHER): Payer: Self-pay

## 2024-01-01 ENCOUNTER — Encounter: Payer: Self-pay | Admitting: Cardiology

## 2024-01-01 VITALS — BP 148/102 | HR 66 | Ht 64.0 in | Wt 204.6 lb

## 2024-01-01 DIAGNOSIS — I10 Essential (primary) hypertension: Secondary | ICD-10-CM | POA: Diagnosis not present

## 2024-01-01 DIAGNOSIS — I110 Hypertensive heart disease with heart failure: Secondary | ICD-10-CM

## 2024-01-01 DIAGNOSIS — Z794 Long term (current) use of insulin: Secondary | ICD-10-CM | POA: Diagnosis not present

## 2024-01-01 DIAGNOSIS — E119 Type 2 diabetes mellitus without complications: Secondary | ICD-10-CM

## 2024-01-01 DIAGNOSIS — R7303 Prediabetes: Secondary | ICD-10-CM

## 2024-01-01 MED ORDER — ATORVASTATIN CALCIUM 20 MG PO TABS: 20.0000 mg | ORAL_TABLET | Freq: Every day | ORAL | 3 refills | Status: AC

## 2024-01-01 MED ORDER — AMLODIPINE BESYLATE 5 MG PO TABS: 5.0000 mg | ORAL_TABLET | Freq: Every day | ORAL | 3 refills | Status: AC

## 2024-01-01 MED ORDER — VALSARTAN 160 MG PO TABS: 160.0000 mg | ORAL_TABLET | Freq: Every day | ORAL | 3 refills | Status: DC

## 2024-01-01 MED ORDER — NEBIVOLOL HCL 20 MG PO TABS: 1.0000 | ORAL_TABLET | Freq: Every day | ORAL | 9 refills | Status: AC

## 2024-01-01 NOTE — Patient Instructions (Signed)
 Medication Instructions:  Refills sent in today   *If you need a refill on your cardiac medications before your next appointment, please call your pharmacy*  Follow-Up: At [facility], you and your health needs are our priority.  As part of our continuing mission to provide you with exceptional heart care, our providers are all part of one team.  This team includes your primary Cardiologist (physician) and Advanced Practice Providers or APPs (Physician Assistants and Nurse Practitioners) who all work together to provide you with the care you need, when you need it.  Your next appointment:   6 month(s)  Provider:   Kardie Tobb, DO    We recommend signing up for the patient portal called MyChart.  Sign up information is provided on this After Visit Summary.  MyChart is used to connect with patients for Virtual Visits (Telemedicine).  Patients are able to view lab/test results, encounter notes, upcoming appointments, etc.  Non-urgent messages can be sent to your provider as well.   To learn more about what you can do with MyChart, go to [email or web address].   Other Instructions  Please take your blood pressure daily for 2 weeks and send in a MyChart message. Please include heart rates. (One message at the end of the 2 weeks).   HOW TO TAKE YOUR BLOOD PRESSURE: Rest 5 minutes before taking your blood pressure. Don't smoke or drink caffeinated beverages for at least 30 minutes before. Take your blood pressure before (not after) you eat. Sit comfortably with your back supported and both feet on the floor (don't cross your legs). Elevate your arm to heart level on a table or a desk. Use the proper sized cuff. It should fit smoothly and snugly around your bare upper arm. There should be enough room to slip a fingertip under the cuff. The bottom edge of the cuff should be 1 inch above the crease of the elbow. Ideally, take 3 measurements at one sitting and record the average.

## 2024-01-01 NOTE — Progress Notes (Signed)
 Cardiology Office Note:    Date:  01/01/2024   ID:  Brandi [name], DOB 12/28/1955, MRN [id number]  PCP:  Georgina Speaks, FNP  Cardiologist:  Meyah Corle, DO  Electrophysiologist:  None   Referring MD: Georgina Speaks, FNP    I am doing fine   History of Present Illness:    Brandi [name] is a 68 y.o. female with a hx of  HTN, hypertensive heart disease, chronic HFpEF, multiple medication intolerances, significant bilateral LE edema, morbid obesity, anemia, arthritis, chronically elevated alk phos, GERD, RA, sciatica, OSA not using CPAP, pre-diabetes seen for follow-up.   She has followed with Dr. Claudene and in the past and was seen by Jodeen Bring, PA after her retired. I will be taking over her care.   Past Medical History:  Diagnosis Date   Allergy In chart   See chart   Anemia    Arthritis    Back pain    BMI 34.0-34.9,adult 06/19/2022   Cataract 10/24   For surgery in March &April   Class 2 obesity due to excess calories with body mass index (BMI) of 35.0 to 35.9 in adult 04/16/2023   Class 3 severe obesity with serious comorbidity and body mass index (BMI) of 45.0 to 49.9 in adult [facility]) 02/15/2022   Diabetes mellitus without complication (HCC)    Questionable   Disequilibrium 06/19/2022   Edema, lower extremity    Food allergy    GERD (gastroesophageal reflux disease)    Hypertension    Hypertensive crisis    Joint pain    Mobility impaired 03/17/2021   Morbid obesity (HCC)    Obesity with body mass index (BMI) of 35.0 to 39.9 without comorbidity 12/12/2022   Osteoporosis    Pre-diabetes    Rheumatoid arthritis (HCC)    Sciatica neuralgia    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   cetirizine (ZYRTEC) 10 MG tablet Take 10 mg by mouth every morning.   Cyanocobalamin (VITAMIN B-12 PO) Take 1 drop by mouth daily.   fluticasone (FLONASE) 50 MCG/ACT nasal spray 1 spray daily as needed for allergies or rhinitis.   gatifloxacin (ZYMAXID) 0.5 % SOLN Place 1 drop into the right eye 4 (four) times daily.   Magnesium 500 MG TABS Take 500 mg by mouth at bedtime.   meloxicam (MOBIC) 7.5 MG tablet Take 7.5 mg by mouth daily.   Multiple Vitamin (MULTIVITAMIN ADULT PO) Take 1 tablet by mouth daily.   mupirocin  ointment (BACTROBAN ) 2 % Apply 1 Application topically daily.   omeprazole (PRILOSEC OTC) 20 MG tablet Take 20 mg by mouth every morning.   ondansetron  (ZOFRAN ) 4 MG tablet Take 1 tablet (4 mg total) by mouth every 8 (eight) hours as needed for nausea or vomiting.   OVER THE COUNTER MEDICATION Take 30 mg by mouth 2 (  two) times daily. Strengtia 30 mg   OVER THE COUNTER MEDICATION Take 1 tablet by mouth 2 (two) times daily. Hepato - C1   OVER THE COUNTER MEDICATION Take 1 capsule by mouth 2 (two) times daily. Omega CO3-SE   prednisoLONE acetate (PRED FORTE) 1 % ophthalmic suspension Place 1 drop into the right eye 4 (four) times daily.   tirzepatide  (MOUNJARO ) 12.5 MG/0.5ML Pen Inject 12.5 mg into the skin once a week.   tizanidine  (ZANAFLEX ) 2 MG capsule TAKE 1 CAPSULE(2 MG) BY MOUTH THREE TIMES DAILY AS NEEDED FOR MUSCLE SPASMS   topiramate  (TOPAMAX ) 50 MG tablet Take 1 tablet (50 mg total) by mouth 2 (two) times daily.   triamcinolone  ointment (KENALOG ) 0.1 % Apply 1 Application topically 2 (two) times daily. (Patient taking differently: Apply 1 Application topically 2 (two) times daily as needed.)   Vitamin E 180 MG CAPS Take 180 mg by mouth every morning.   [DISCONTINUED] amLODipine   (NORVASC ) 5 MG tablet Take 1 tablet (5 mg total) by mouth daily.   [DISCONTINUED] atorvastatin  (LIPITOR) 20 MG tablet TAKE 1 TABLET(20 MG) BY MOUTH DAILY   [DISCONTINUED] Nebivolol  HCl 20 MG TABS TAKE 1 TABLET BY MOUTH EVERY DAY   [DISCONTINUED] valsartan  (DIOVAN ) 160 MG tablet TAKE 1 TABLET(160 MG) BY MOUTH DAILY     Allergies:   Hydralazine, Meperidine hcl, Aliskiren, Aspirin, Juniper berries, Latex, Rofecoxib, Spironolactone , Chocolate, Furosemide, Oxycodone-acetaminophen, and Sulfa antibiotics   Social History   Socioeconomic History   Marital status: Single    Spouse name: Not on file   Number of children: Not on file   Years of education: Not on file   Highest education level: Professional school degree (e.g., MD, DDS, DVM, JD)  Occupational History   Occupation: Retired Radiographer, therapeutic Nurseb  Tobacco Use   Smoking status: Former    Current packs/day: 0.00    Average packs/day: 0.3 packs/day for 6.0 years (1.6 ttl pk-yrs)    Types: Cigarettes    Quit date: 06/09/1988    Years since quitting: 35.5   Smokeless tobacco: Never   Tobacco comments:    Stop along time ago  Vaping Use   Vaping status: Never Used  Substance and Sexual Activity   Alcohol use: Not Currently   Drug use: Not Currently   Sexual activity: Not Currently  Other Topics Concern   Not on file  Social History Narrative   Lives alone   Right handed   Drinks no caffeine   Social Drivers of Health   Financial Resource Strain: Low Risk  (12/04/2023)   Overall Financial Resource Strain (CARDIA)    Difficulty of Paying Living Expenses: Not hard at all  Food Insecurity: No Food Insecurity (12/04/2023)   Hunger Vital Sign    Worried About Running Out of Food in the Last Year: Never true    Ran Out of Food in the Last Year: Never true  Transportation Needs: No Transportation Needs (12/04/2023)   PRAPARE - Administrator, Civil Service (Medical): No    Lack of Transportation (Non-Medical): No  Physical  Activity: Insufficiently Active (12/04/2023)   Exercise Vital Sign    Days of Exercise per Week: 2 days    Minutes of Exercise per Session: 30 min  Stress: No Stress Concern Present (12/04/2023)   [employer] of Occupational Health - Occupational Stress Questionnaire    Feeling of Stress: Only a little  Social Connections: Moderately Integrated (12/04/2023)   Social Connection and Isolation Panel  Frequency of Communication with Friends and Family: More than three times a week    Frequency of Social Gatherings with Friends and Family: More than three times a week    Attends Religious Services: More than 4 times per year    Active Member of [employer] or Organizations: Yes    Attends Engineer, structural: More than 4 times per year    Marital Status: Divorced     Family History: The patient's family history includes Anxiety disorder in her father; Bipolar disorder in her father; Cancer in her mother; Diabetes in her father; Eating disorder in her mother; Heart disease in her father and mother; High Cholesterol in her father; High blood pressure in her father; Hypertension in her mother and sister; Kidney disease in her father; Obesity in her mother; Schizophrenia in her father.  ROS:   Review of Systems  Constitution: Negative for decreased appetite, fever and weight gain.  HENT: Negative for congestion, ear discharge, hoarse voice and sore throat.   Eyes: Negative for discharge, redness, vision loss in right eye and visual halos.  Cardiovascular: Negative for chest pain, dyspnea on exertion, leg swelling, orthopnea and palpitations.  Respiratory: Negative for cough, hemoptysis, shortness of breath and snoring.   Endocrine: Negative for heat intolerance and polyphagia.  Hematologic/Lymphatic: Negative for bleeding problem. Does not bruise/bleed easily.  Skin: Negative for flushing, nail changes, rash and suspicious lesions.  Musculoskeletal: Negative for arthritis, joint pain,  muscle cramps, myalgias, neck pain and stiffness.  Gastrointestinal: Negative for abdominal pain, bowel incontinence, diarrhea and excessive appetite.  Genitourinary: Negative for decreased libido, genital sores and incomplete emptying.  Neurological: Negative for brief paralysis, focal weakness, headaches and loss of balance.  Psychiatric/Behavioral: Negative for altered mental status, depression and suicidal ideas.  Allergic/Immunologic: Negative for HIV exposure and persistent infections.    EKGs/Labs/Other Studies Reviewed:    The following studies were reviewed today:   EKG:  The ekg ordered today demonstrates   Recent Labs: 07/24/2023: TSH 1.970 12/05/2023: ALT 26; BUN 14; Creatinine, Ser 0.89; Hemoglobin 12.8; Platelets 216; Potassium 4.0; Sodium 140  Recent Lipid Panel    Component Value Date/Time   CHOL 145 12/05/2023 1116   TRIG 66 12/05/2023 1116   HDL 57 12/05/2023 1116   CHOLHDL 2.5 12/05/2023 1116   LDLCALC 75 12/05/2023 1116    Physical Exam:    VS:  BP (!) 148/102   Pulse 66   Ht 5' 4 (1.626 m)   Wt 204 lb 9.6 oz (92.8 kg)   SpO2 99%   BMI 35.12 kg/m     Wt Readings from Last 3 Encounters:  01/01/24 204 lb 9.6 oz (92.8 kg)  12/13/23 202 lb (91.6 kg)  12/05/23 196 lb (88.9 kg)     GEN: Well nourished, well developed in no acute distress HEENT: Normal NECK: No JVD; No carotid bruits LYMPHATICS: No lymphadenopathy CARDIAC: S1S2 noted,RRR, no murmurs, rubs, gallops RESPIRATORY:  Clear to auscultation without rales, wheezing or rhonchi  ABDOMEN: Soft, non-tender, non-distended, +bowel sounds, no guarding. EXTREMITIES: No edema, No cyanosis, no clubbing MUSCULOSKELETAL:  No deformity  SKIN: Warm and dry NEUROLOGIC:  Alert and oriented x 3, non-focal PSYCHIATRIC:  Normal affect, good insight  ASSESSMENT:    1. Prediabetes   2. Essential hypertension   3. Hypertensive heart disease with heart failure (HCC)   4. Insulin -requiring or dependent type  II diabetes mellitus (HCC)   5. Morbid obesity (HCC)    PLAN:  She is hypertensive in the office today - but tell me that at home her blood pressure is at target of less than 130/80 millimeters mercury.  I did share with the patient that her previous blood pressure seems to be elevated as well.  She had decision she is, take her blood pressure daily send me information on MyChart after 2 weeks should she be elevated there are plans to increase her antihypertensive medications.  OSA she is not using the CPAP at this point.  Prediabetes-is being managed by her primary doctor  The patient understands the need to lose weight with diet and exercise. We have discussed specific strategies for this.  The patient is in agreement with the above plan. The patient left the office in stable condition.  The patient will follow up in   Medication Adjustments/Labs and Tests Ordered: Current medicines are reviewed at length with the patient today.  Concerns regarding medicines are outlined above.  No orders of the defined types were placed in this encounter.  Meds ordered this encounter  Medications   amLODipine  (NORVASC ) 5 MG tablet    Sig: Take 1 tablet (5 mg total) by mouth daily.    Dispense:  90 tablet    Refill:  3   atorvastatin  (LIPITOR) 20 MG tablet    Sig: Take 1 tablet (20 mg total) by mouth daily.    Dispense:  90 tablet    Refill:  3   Nebivolol  HCl 20 MG TABS    Sig: Take 1 tablet (20 mg total) by mouth daily.    Dispense:  30 tablet    Refill:  9   valsartan  (DIOVAN ) 160 MG tablet    Sig: Take 1 tablet (160 mg total) by mouth daily.    Dispense:  90 tablet    Refill:  3    Patient Instructions  Medication Instructions:  Refills sent in today   *If you need a refill on your cardiac medications before your next appointment, please call your pharmacy*  Follow-Up: At [facility], you and your health needs are our priority.  As part of our continuing mission to  provide you with exceptional heart care, our providers are all part of one team.  This team includes your primary Cardiologist (physician) and Advanced Practice Providers or APPs (Physician Assistants and Nurse Practitioners) who all work together to provide you with the care you need, when you need it.  Your next appointment:   6 month(s)  Provider:   Shaylee Stanislawski, DO    We recommend signing up for the patient portal called MyChart.  Sign up information is provided on this After Visit Summary.  MyChart is used to connect with patients for Virtual Visits (Telemedicine).  Patients are able to view lab/test results, encounter notes, upcoming appointments, etc.  Non-urgent messages can be sent to your provider as well.   To learn more about what you can do with MyChart, go to [email or web address].   Other Instructions  Please take your blood pressure daily for 2 weeks and send in a MyChart message. Please include heart rates. (One message at the end of the 2 weeks).   HOW TO TAKE YOUR BLOOD PRESSURE: Rest 5 minutes before taking your blood pressure. Don't smoke or drink caffeinated beverages for at least 30 minutes before. Take your blood pressure before (not after) you eat. Sit comfortably with your back supported and both feet on the floor (don't cross your legs). Elevate your arm to heart level  on a table or a desk. Use the proper sized cuff. It should fit smoothly and snugly around your bare upper arm. There should be enough room to slip a fingertip under the cuff. The bottom edge of the cuff should be 1 inch above the crease of the elbow. Ideally, take 3 measurements at one sitting and record the average.            Adopting a Healthy Lifestyle.  Know what a healthy weight is for you (roughly BMI <25) and aim to maintain this   Aim for 7+ servings of fruits and vegetables daily   65-80+ fluid ounces of water or unsweet tea for healthy kidneys   Limit to max 1 drink of  alcohol per day; avoid smoking/tobacco   Limit animal fats in diet for cholesterol and heart health - choose grass fed whenever available   Avoid highly processed foods, and foods high in saturated/trans fats   Aim for low stress - take time to unwind and care for your mental health   Aim for 150 min of moderate intensity exercise weekly for heart health, and weights twice weekly for bone health   Aim for 7-9 hours of sleep daily   When it comes to diets, agreement about the perfect plan isnt easy to find, even among the experts. Experts at the [facility] [employer] developed an idea known as the Healthy Eating Plate. Just imagine a plate divided into logical, healthy portions.   The emphasis is on diet quality:   Load up on vegetables and fruits - one-half of your plate: Aim for color and variety, and remember that potatoes dont count.   Go for whole grains - one-quarter of your plate: Whole wheat, barley, wheat berries, quinoa, oats, brown rice, and foods made with them. If you want pasta, go with whole wheat pasta.   Protein power - one-quarter of your plate: Fish, chicken, beans, and nuts are all healthy, versatile protein sources. Limit red meat.   The diet, however, does go beyond the plate, offering a few other suggestions.   Use healthy plant oils, such as olive, canola, soy, corn, sunflower and peanut. Check the labels, and avoid partially hydrogenated oil, which have unhealthy trans fats.   If youre thirsty, drink water. Coffee and tea are good in moderation, but skip sugary drinks and limit milk and dairy products to one or two daily servings.   The type of carbohydrate in the diet is more important than the amount. Some sources of carbohydrates, such as vegetables, fruits, whole grains, and beans-are healthier than others.   Finally, stay active  Signed, Dub Huntsman, DO  01/01/2024 6:07 PM    [facility]

## 2024-01-08 ENCOUNTER — Ambulatory Visit
Admission: RE | Admit: 2024-01-08 | Discharge: 2024-01-08 | Disposition: A | Source: Ambulatory Visit | Attending: Nurse Practitioner | Admitting: Nurse Practitioner

## 2024-01-08 DIAGNOSIS — Z1231 Encounter for screening mammogram for malignant neoplasm of breast: Secondary | ICD-10-CM | POA: Diagnosis not present

## 2024-01-10 ENCOUNTER — Other Ambulatory Visit (HOSPITAL_BASED_OUTPATIENT_CLINIC_OR_DEPARTMENT_OTHER): Payer: Self-pay

## 2024-01-10 ENCOUNTER — Encounter: Payer: Self-pay | Admitting: Bariatrics

## 2024-01-10 ENCOUNTER — Ambulatory Visit: Admitting: Bariatrics

## 2024-01-10 VITALS — BP 135/86 | HR 68 | Ht 64.0 in | Wt 202.0 lb

## 2024-01-10 DIAGNOSIS — E669 Obesity, unspecified: Secondary | ICD-10-CM

## 2024-01-10 DIAGNOSIS — E66811 Obesity, class 1: Secondary | ICD-10-CM

## 2024-01-10 DIAGNOSIS — Z6834 Body mass index (BMI) 34.0-34.9, adult: Secondary | ICD-10-CM | POA: Diagnosis not present

## 2024-01-10 DIAGNOSIS — E785 Hyperlipidemia, unspecified: Secondary | ICD-10-CM

## 2024-01-10 DIAGNOSIS — E1169 Type 2 diabetes mellitus with other specified complication: Secondary | ICD-10-CM

## 2024-01-10 DIAGNOSIS — Z7985 Long-term (current) use of injectable non-insulin antidiabetic drugs: Secondary | ICD-10-CM

## 2024-01-10 DIAGNOSIS — E119 Type 2 diabetes mellitus without complications: Secondary | ICD-10-CM

## 2024-01-10 DIAGNOSIS — E7849 Other hyperlipidemia: Secondary | ICD-10-CM

## 2024-01-10 MED ORDER — TIRZEPATIDE 12.5 MG/0.5ML ~~LOC~~ SOAJ
12.5000 mg | SUBCUTANEOUS | 0 refills | Status: DC
  Filled 2024-01-10: qty 2, 28d supply, fill #0

## 2024-01-10 NOTE — Progress Notes (Signed)
 WEIGHT SUMMARY AND BIOMETRICS  Weight Lost Since Last Visit: 0  Weight Gained Since Last Visit: 0   Vitals Temp: -- (could not obtain) BP: 135/86 Pulse Rate: 68 SpO2: 100 %   Anthropometric Measurements Height: 5' 4 (1.626 m) Weight: 202 lb (91.6 kg) BMI (Calculated): 34.66 Weight at Last Visit: 202lb Weight Lost Since Last Visit: 0 Weight Gained Since Last Visit: 0 Starting Weight: 273lb Total Weight Loss (lbs): 71 lb (32.2 kg)   Body Composition  Body Fat %: 50.9 % Fat Mass (lbs): 103 lbs Muscle Mass (lbs): 94.2 lbs Visceral Fat Rating : 15   Other Clinical Data Fasting: no Labs: no Today's Visit #: 90 Starting Date: 09/04/20    OBESITY Brandi [name] is here to discuss her progress with her obesity treatment plan along with follow-up of her obesity related diagnoses.    Nutrition Plan: the Category 1 plan - 70% adherence.  Current exercise: walking  Interim History:  Her weight remains the same.  Eating all of the food on the plan., Protein intake is as prescribed, Not journaling consistently., and Water intake is adequate.   Pharmacotherapy: Brandi [name] is on Mounjaro  12.5 mg SQ weekly Adverse side effects: None Hunger is moderately controlled.  Cravings are moderately controlled.  Assessment/Plan:   Type II Diabetes HgbA1c is at goal. Last A1c was 5.2 Episodes of hypoglycemia: no Medication(s): Mounjaro  12.5 mg SQ weekly  Lab Results  Component Value Date   HGBA1C 5.2 12/05/2023   HGBA1C 5.0 04/16/2023   HGBA1C 5.2 12/12/2022   Lab Results  Component Value Date   MICROALBUR 10 03/11/2020   LDLCALC 75 12/05/2023   CREATININE 0.89 12/05/2023   No results found for: GFR  Plan: Continue and refill Mounjaro  12.5 mg SQ weekly Continue all other medications.  Will keep all carbohydrates low both sweets and starches.  Will continue  exercise regimen to 30 to 60 minutes on most days of the week. Will add in her weights.    Hyperlipidemia LDL is almost at goal. Medication(s): Lipitor Cardiovascular risk factors: advanced age (older than 39 for men, 59 for women), diabetes mellitus, dyslipidemia, obesity (BMI >= 30 kg/m2), and sedentary lifestyle  Lab Results  Component Value Date   CHOL 145 12/05/2023   HDL 57 12/05/2023   LDLCALC 75 12/05/2023   TRIG 66 12/05/2023   CHOLHDL 2.5 12/05/2023   Lab Results  Component Value Date   ALT 26 12/05/2023   AST 27 12/05/2023   ALKPHOS 170 (H) 12/05/2023   BILITOT 0.3 12/05/2023   The 10-year ASCVD risk score (Arnett DK, et al., 2019) is: 22.2%   Values used to calculate the score:     Age: 68 years     Clincally relevant sex: Female     Is Non-Hispanic African American: Yes     Diabetic: Yes     Tobacco smoker:  No     Systolic Blood Pressure: 135 mmHg     Is BP treated: Yes     HDL Cholesterol: 57 mg/dL     Total Cholesterol: 145 mg/dL  Plan:  Continue statin.  Will avoid all trans fats.  Will read labels Will minimize saturated fats except the following: low fat meats in moderation, diary, and limited dark chocolate.      Generalized Obesity: Current BMI BMI (Calculated): 34.66   Pharmacotherapy Plan Continue and refill  Mounjaro  12.5 mg SQ weekly  Brandi [name] is currently in the action stage of change. As such, her goal is to continue with weight loss efforts.  She has agreed to the Category 1 plan.  Exercise goals: Older adults with chronic conditions should understand whether and how their conditions affect their ability to do regular physical activity safely.  Behavioral modification strategies: increasing lean protein intake, meal planning , increase water intake, better snacking choices, planning for success, increasing vegetables, avoiding temptations, keep healthy foods in the home, increase frequency of journaling, and mindful eating.  Brandi [name] has  agreed to follow-up with our clinic in 4 weeks.    Objective:   VITALS: Per patient if applicable, see vitals. GENERAL: Alert and in no acute distress. CARDIOPULMONARY: No increased WOB. Speaking in clear sentences.  PSYCH: Pleasant and cooperative. Speech normal rate and rhythm. Affect is appropriate. Insight and judgement are appropriate. Attention is focused, linear, and appropriate.  NEURO: Oriented as arrived to appointment on time with no prompting.   Attestation Statements:   This was prepared with the assistance of Engineer, civil (consulting).  Occasional wrong-word or sound-a-like substitutions may have occurred due to the inherent limitations of voice recognition   Clayborne Daring, DO

## 2024-01-22 ENCOUNTER — Encounter: Payer: Self-pay | Admitting: Cardiology

## 2024-01-22 NOTE — Telephone Encounter (Signed)
 Please review

## 2024-02-14 ENCOUNTER — Other Ambulatory Visit (HOSPITAL_BASED_OUTPATIENT_CLINIC_OR_DEPARTMENT_OTHER): Payer: Self-pay

## 2024-02-14 ENCOUNTER — Encounter: Payer: Self-pay | Admitting: Bariatrics

## 2024-02-14 ENCOUNTER — Ambulatory Visit: Admitting: Bariatrics

## 2024-02-14 VITALS — BP 144/80 | HR 66 | Ht 64.0 in | Wt 203.0 lb

## 2024-02-14 DIAGNOSIS — Z6834 Body mass index (BMI) 34.0-34.9, adult: Secondary | ICD-10-CM

## 2024-02-14 DIAGNOSIS — I10 Essential (primary) hypertension: Secondary | ICD-10-CM

## 2024-02-14 DIAGNOSIS — E119 Type 2 diabetes mellitus without complications: Secondary | ICD-10-CM

## 2024-02-14 DIAGNOSIS — Z7985 Long-term (current) use of injectable non-insulin antidiabetic drugs: Secondary | ICD-10-CM

## 2024-02-14 DIAGNOSIS — E669 Obesity, unspecified: Secondary | ICD-10-CM | POA: Diagnosis not present

## 2024-02-14 DIAGNOSIS — E1169 Type 2 diabetes mellitus with other specified complication: Secondary | ICD-10-CM

## 2024-02-14 MED ORDER — TIRZEPATIDE 12.5 MG/0.5ML ~~LOC~~ SOAJ
12.5000 mg | SUBCUTANEOUS | 0 refills | Status: DC
  Filled 2024-02-14: qty 2, 28d supply, fill #0
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
## 2024-03-11 ENCOUNTER — Other Ambulatory Visit (HOSPITAL_BASED_OUTPATIENT_CLINIC_OR_DEPARTMENT_OTHER): Payer: Self-pay

## 2024-03-11 ENCOUNTER — Ambulatory Visit: Admitting: Bariatrics

## 2024-03-11 ENCOUNTER — Encounter: Payer: Self-pay | Admitting: Bariatrics

## 2024-03-11 VITALS — BP 146/88 | HR 74 | Ht 64.0 in | Wt 204.0 lb

## 2024-03-11 DIAGNOSIS — E1169 Type 2 diabetes mellitus with other specified complication: Secondary | ICD-10-CM

## 2024-03-11 DIAGNOSIS — Z6835 Body mass index (BMI) 35.0-35.9, adult: Secondary | ICD-10-CM

## 2024-03-11 DIAGNOSIS — E7849 Other hyperlipidemia: Secondary | ICD-10-CM

## 2024-03-11 DIAGNOSIS — E669 Obesity, unspecified: Secondary | ICD-10-CM

## 2024-03-11 MED ORDER — TIRZEPATIDE 12.5 MG/0.5ML ~~LOC~~ SOAJ
12.5000 mg | SUBCUTANEOUS | 0 refills | Status: DC
  Filled 2024-03-11: qty 2, 28d supply, fill #0

## 2024-03-11 NOTE — Progress Notes (Signed)
 WEIGHT SUMMARY AND BIOMETRICS  Weight Lost Since Last Visit: 0  Weight Gained Since Last Visit: 1lb   Vitals BP: (!) 146/88 Pulse Rate: 74 SpO2: 98 %   Anthropometric Measurements Height: 5' 4 (1.626 m) Weight: 204 lb (92.5 kg) BMI (Calculated): 35 Weight at Last Visit: 203lb Weight Lost Since Last Visit: 0 Weight Gained Since Last Visit: 1lb Starting Weight: 273lb Total Weight Loss (lbs): 69 lb (31.3 kg)   Body Composition  Body Fat %: 51.3 % Fat Mass (lbs): 105 lbs Muscle Mass (lbs): 94.8 lbs Visceral Fat Rating : 16   Other Clinical Data Fasting: no Labs: no Today's Visit #: 48 Starting Date: 09/04/20    OBESITY Brandi [name] is here to discuss her progress with her obesity treatment plan along with follow-up of her obesity related diagnoses.    Nutrition Plan: the Category 1 plan - 70% adherence.  Current exercise: walking  Interim History:  She is up 1 lb since her last visit. According to the bio-impedence, her muscle mass is stable and her body fat % is up slightly.  Eating all of the food on the plan., Protein intake is as prescribed, Journaling consistently., and Water intake is adequate.   Pharmacotherapy: Brandi [name] is on Mounjaro  12.5 mg SQ weekly Adverse side effects: None Hunger is moderately controlled.  Cravings are moderately controlled.  Assessment/Plan:   Type II Diabetes HgbA1c is at goal. Last A1c was 5.2 CBGs: Not checking      Episodes of hypoglycemia: no Medication(s): Mounjaro  12.5 mg SQ weekly  Lab Results  Component Value Date   HGBA1C 5.2 12/05/2023   HGBA1C 5.0 04/16/2023   HGBA1C 5.2 12/12/2022   Lab Results  Component Value Date   MICROALBUR 10 03/11/2020   LDLCALC 75 12/05/2023   CREATININE 0.89 12/05/2023   No results found for: GFR  Plan: Continue and refill Mounjaro  12.5 mg SQ weekly Will have a  protein shake if she does not have a meal.  Will keep all carbohydrates low both sweets and starches.  Will continue exercise regimen to 30 to 60 minutes on most days of the week.  Aim for 7 to 9 hours of sleep nightly.  Eat more low glycemic index foods.  Will increase her meal planning. Discussed ideas for holiday events and meal guidance.   Hyperlipidemia LDL is not at goal. Medication(s): Lipitor  Cardiovascular risk factors: advanced age (older than 15 for men, 38 for women), diabetes mellitus, dyslipidemia, obesity (BMI >= 30 kg/m2), and sedentary lifestyle  Lab Results  Component Value Date   CHOL 145 12/05/2023   HDL 57 12/05/2023   LDLCALC 75 12/05/2023   TRIG 66 12/05/2023   CHOLHDL 2.5 12/05/2023   Lab Results  Component Value Date   ALT 26 12/05/2023   AST 27 12/05/2023   ALKPHOS 170 (H) 12/05/2023   BILITOT 0.3 12/05/2023  The 10-year ASCVD risk score (Arnett DK, et al., 2019) is: 25.4%   Values used to calculate the score:     Age: 68 years     Clinically relevant sex: Female     Is Non-Hispanic African American: Yes     Diabetic: Yes     Tobacco smoker: No     Systolic Blood Pressure: 146 mmHg     Is BP treated: Yes     HDL Cholesterol: 57 mg/dL     Total Cholesterol: 145 mg/dL  Plan:  Continue statin.  Will avoid all trans fats.  Will read labels Will minimize saturated fats except the following: low fat meats in moderation, diary, and limited dark chocolate.  Will eat more meals at home.  Discussed alcoholic beverages calories, amounts and ways to minimize calories during the holidays (handout given).   Generalized Obesity: Current BMI BMI (Calculated): 35   Pharmacotherapy Plan Continue and refill  Mounjaro  12.5 mg SQ weekly  Brandi [name] is currently in the action stage of change. As such, her goal is to continue with weight loss efforts.  She has agreed to the Category 1 plan.  Exercise goals: Older adults should do exercises that maintain or  improve balance if they are at risk of falling.   Behavioral modification strategies: increasing lean protein intake, decreasing simple carbohydrates , no meal skipping, meal planning , increase water intake, better snacking choices, planning for success, increasing vegetables, avoiding temptations, keep healthy foods in the home, weigh protein portions, and measure portion sizes.  Brandi [name] has agreed to follow-up with our clinic in 4 weeks.    Objective:   VITALS: Per patient if applicable, see vitals. GENERAL: Alert and in no acute distress. CARDIOPULMONARY: No increased WOB. Speaking in clear sentences.  PSYCH: Pleasant and cooperative. Speech normal rate and rhythm. Affect is appropriate. Insight and judgement are appropriate. Attention is focused, linear, and appropriate.  NEURO: Oriented as arrived to appointment on time with no prompting.   Attestation Statements:   This was prepared with the assistance of Engineer, Civil (consulting).  Occasional wrong-word or sound-a-like substitutions may have occurred due to the inherent limitations of voice recognition.  Clayborne Daring, DO

## 2024-03-31 ENCOUNTER — Other Ambulatory Visit: Payer: Self-pay | Admitting: Physician Assistant

## 2024-03-31 DIAGNOSIS — I10 Essential (primary) hypertension: Secondary | ICD-10-CM

## 2024-04-08 NOTE — Progress Notes (Signed)
 Brandi [name], CMA,acting as a neurosurgeon for [name], FNP.,have documented all relevant documentation on the behalf of [name], FNP,as directed by  [name], FNP while in the presence of [name], FNP.  Subjective:  Patient ID: Brandi [name] , female    DOB: 1955-05-30 , 69 y.o.   MRN: [id number]  Chief Complaint  Patient presents with   Hypertension    Patient presents today for a bp and dm follow up, Patient reports compliance with medication. Patient denies any chest pain, SOB, or headaches. Patient has no concerns today.     She is due to [facility] Dr[facility] Del[facility]  She was diagnosed with her PCPs in [place], [place].     Discussed the use of AI scribe software for clinical note transcription with the patient, who gave verbal consent to proceed.  History of Present Illness Brandi [name] is a 69 year old female who presents for routine follow-up.  She is currently taking Topamax  (topiramate ) twice daily for sciatica and previously for migraines, which she no longer experiences.  Her blood pressure was elevated during her last visit but is stable today. She attributes this improvement to increased physical activity, such as parking further away and walking to her appointments.  Her last A1c, vitamin D , and cholesterol levels were checked in September and were normal. She is due for a repeat check in five months.  She has not completed a Cologuard test since December 2022 and has not had a colonoscopy.  She had cataract surgery last year and reports excellent vision following the procedure. Her most recent eye exam was in December, and she is awaiting the report to be sent to her current provider.  She has a history of rheumatoid arthritis diagnosed in the 1980s but does not currently see a rheumatologist. She experiences joint aches but manages them with activity.  She has a history of sleep apnea but reports using her CPAP less frequently  due to weight loss and improved sleep quality.  She attends weekly chiropractic sessions and reports improved balance and no recent falls.  Past Medical History:  Diagnosis Date   Allergy In chart   See chart   Anemia    Arthritis    Back pain    BMI 34.0-34.9,adult 06/19/2022   Cataract 10/24   For surgery in March &April   Class 2 obesity due to excess calories with body mass index (BMI) of 35.0 to 35.9 in adult 04/16/2023   Class 3 severe obesity with serious comorbidity and body mass index (BMI) of 45.0 to 49.9 in adult [facility]) 02/15/2022   Diabetes mellitus without complication (HCC)    Questionable   Disequilibrium 06/19/2022   Edema, lower extremity    Food allergy    GERD (gastroesophageal reflux disease)    Hypertension    Hypertensive crisis    Joint pain    Mobility impaired 03/17/2021   Morbid obesity (HCC)    Obesity with body mass index (BMI) of 35.0 to 39.9 without comorbidity 12/12/2022   Osteoporosis    Pre-diabetes    Rheumatoid arthritis (HCC)    Sciatica neuralgia    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  Respiratory: Negative.    Cardiovascular:  Negative for chest pain, palpitations and leg swelling.  Endocrine: Negative for polydipsia, polyphagia and polyuria.  Musculoskeletal:        Left sciatic nerve pain, continues to walk.   Neurological:  Negative for dizziness and headaches.  Psychiatric/Behavioral:  Negative.       Today's Vitals   04/09/24 1113  BP: 126/70  Pulse: 76  Temp: 98.4 F (36.9 C)  TempSrc: Oral  Weight: 207 lb 6.4 oz (94.1 kg)  Height: 5' 4 (1.626 m)  PainSc: 0-No pain   Body mass index is 35.6 kg/m.  Wt Readings from Last 3 Encounters:  04/10/24 201 lb (91.2 kg)  04/09/24 207 lb 6.4 oz (94.1 kg)  03/11/24 204 lb (92.5 kg)      Objective:  Physical Exam Vitals and nursing note reviewed.  Constitutional:      General: She is not in acute distress.    Appearance: Normal appearance. She is obese.  Eyes:     Pupils: Pupils are equal, round, and reactive to light.  Cardiovascular:     Rate and Rhythm: Normal rate and regular rhythm.     Pulses: Normal pulses.     Heart sounds: Normal heart sounds. No murmur heard. Pulmonary:     Effort: Pulmonary effort is normal. No respiratory distress.     Breath sounds: Normal breath sounds. No wheezing.  Musculoskeletal:        General: No swelling or tenderness. Normal range of motion.     Right lower leg: No edema.     Left lower leg: No edema.  Skin:    General: Skin is warm and dry.     Capillary Refill: Capillary refill takes less than 2 seconds.     Coloration: Skin is not jaundiced.     Findings: No bruising.  Neurological:     General: No focal deficit present.     Mental Status: She is alert and oriented to person, place, and time.     Cranial Nerves: No cranial nerve deficit.     Motor: No weakness.  Psychiatric:        Mood and Affect: Mood normal.        Behavior: Behavior normal.        Thought Content: Thought content normal.        Judgment: Judgment normal.         Assessment And Plan:   Assessment & Plan Type 2 diabetes mellitus with hyperlipidemia (HCC) A1c and cholesterol levels were normal in September. - Repeat A1c and cholesterol levels in September - Requested recent eye exam report from [facility] in [facility]. Hypertensive heart disease with diastolic heart failure  (HCC) Blood pressure was elevated during the visit but is currently well-managed. Vitamin D  deficiency Will check vitamin D  level and supplement as needed.    Also encouraged to spend 15 minutes in the sun daily.   Colon cancer screening According to USPTF Colorectal cancer Screening guidelines. Cologuard is recommended every  3 years, starting at age 58 years. Order for cologuard sent Rheumatoid arthritis, involving unspecified site, unspecified whether rheumatoid factor present (HCC) Continue f/u with Rheumatology  Obstructive sleep apnea syndrome  CPAP (continuous positive airway pressure) dependence She has not been using her CPAP machine since losing her weight.  Class 2 obesity due to excess calories with body mass index (BMI) of 35.0 to 35.9 in adult, unspecified whether serious comorbidity present Weight is stable. She is actively trying to stay active. - Continue attending weight management appointments. History of migraine  History of sciatica   Orders Placed This Encounter  Procedures   Hemoglobin A1c   BMP8+eGFR   Cologuard     Return for controlled DM check 4 months.  Patient was given opportunity to ask questions. Patient verbalized understanding of the plan and was able to repeat key elements of the plan. All questions were answered to their satisfaction.    Brandi [name], FNP, have reviewed all documentation for this visit. The documentation on 04/09/24 for the exam, diagnosis, procedures, and orders are all accurate and complete.   IF YOU HAVE BEEN REFERRED TO A SPECIALIST, IT MAY TAKE 1-2 WEEKS TO SCHEDULE/PROCESS THE REFERRAL. IF YOU HAVE NOT HEARD FROM US /SPECIALIST IN TWO WEEKS, PLEASE GIVE US  A CALL AT [phone].      [1]  Current Outpatient Medications:    amLODipine  (NORVASC ) 5 MG tablet, Take 1 tablet (5 mg total) by mouth daily., Disp: 90 tablet, Rfl: 3   atorvastatin  (LIPITOR) 20 MG tablet, Take 1 tablet (20 mg total) by mouth daily.,  Disp: 90 tablet, Rfl: 3   Biotin 1000 MCG tablet, Take 1,000 mcg by mouth every morning., Disp: , Rfl:    cetirizine (ZYRTEC) 10 MG tablet, Take 10 mg by mouth every morning., Disp: , Rfl:    Cyanocobalamin (VITAMIN B-12 PO), Take 1 drop by mouth daily., Disp: , Rfl:    fluticasone (FLONASE) 50 MCG/ACT nasal spray, 1 spray daily as needed for allergies or rhinitis., Disp: , Rfl:    gatifloxacin (ZYMAXID) 0.5 % SOLN, Place 1 drop into the right eye 4 (four) times daily., Disp: , Rfl:    Magnesium 500 MG TABS, Take 500 mg by mouth at bedtime., Disp: , Rfl:    meloxicam (MOBIC) 7.5 MG tablet, Take 7.5 mg by mouth daily., Disp: , Rfl:    Multiple Vitamin (MULTIVITAMIN ADULT PO), Take 1 tablet by mouth daily., Disp: , Rfl:    mupirocin  ointment (BACTROBAN ) 2 %, Apply 1 Application topically daily., Disp: 22 g, Rfl: 0   Nebivolol  HCl 20 MG TABS, Take 1 tablet (20 mg total) by mouth daily., Disp: 30 tablet, Rfl: 9   omeprazole (PRILOSEC OTC) 20 MG tablet, Take 20 mg by mouth every morning., Disp: , Rfl:    ondansetron  (ZOFRAN ) 4 MG tablet, Take 1 tablet (4 mg total) by mouth every 8 (eight) hours as needed for nausea or vomiting., Disp: 20 tablet, Rfl: 0   OVER THE COUNTER MEDICATION, Take 30 mg by mouth 2 (two) times daily. Strengtia 30 mg, Disp: , Rfl:    OVER THE COUNTER MEDICATION, Take 1 tablet by mouth 2 (two) times daily. Hepato - C1, Disp: , Rfl:    OVER THE COUNTER MEDICATION, Take 1 capsule by mouth 2 (two) times daily. Omega CO3-SE, Disp: , Rfl:    prednisoLONE acetate (PRED FORTE) 1 % ophthalmic suspension, Place 1 drop into the right eye 4 (four) times daily., Disp: , Rfl:  tizanidine  (ZANAFLEX ) 2 MG capsule, TAKE 1 CAPSULE(2 MG) BY MOUTH THREE TIMES DAILY AS NEEDED FOR MUSCLE SPASMS, Disp: 60 capsule, Rfl: 1   triamcinolone  ointment (KENALOG ) 0.1 %, Apply 1 Application topically 2 (two) times daily. (Patient taking differently: Apply 1 Application topically 2 (two) times daily as needed.),  Disp: 30 g, Rfl: 2   valsartan  (DIOVAN ) 160 MG tablet, TAKE 1 TABLET(160 MG) BY MOUTH DAILY, Disp: 90 tablet, Rfl: 2   Vitamin E 180 MG CAPS, Take 180 mg by mouth every morning., Disp: , Rfl:    tirzepatide  (MOUNJARO ) 12.5 MG/0.5ML Pen, Inject 12.5 mg into the skin once a week., Disp: 2 mL, Rfl: 0   topiramate  (TOPAMAX ) 50 MG tablet, Take 1 tablet (50 mg total) by mouth 2 (two) times daily., Disp: 180 tablet, Rfl: 1 [2]  Allergies Allergen Reactions   Hydralazine Itching   Meperidine Hcl Rash and Other (See Comments)    hallucinations   Aliskiren Hives   Aspirin Nausea Only   Juniper Berries Hives   Latex Hives   Rofecoxib Diarrhea    Reaction to Vioxx   Spironolactone  Other (See Comments)    High doses caused nausea and rash - may can tolerate low doses   Chocolate Nausea And Vomiting and Rash   Furosemide Other (See Comments)    Rash and extreme dehydration   Oxycodone-Acetaminophen Itching and Rash   Sulfa Antibiotics Rash

## 2024-04-09 ENCOUNTER — Ambulatory Visit: Payer: Self-pay | Admitting: Nurse Practitioner

## 2024-04-09 ENCOUNTER — Encounter: Payer: Self-pay | Admitting: Nurse Practitioner

## 2024-04-09 VITALS — BP 126/70 | HR 76 | Temp 98.4°F | Ht 64.0 in | Wt 207.4 lb

## 2024-04-09 DIAGNOSIS — E6609 Other obesity due to excess calories: Secondary | ICD-10-CM

## 2024-04-09 DIAGNOSIS — E66812 Obesity, class 2: Secondary | ICD-10-CM

## 2024-04-09 DIAGNOSIS — Z9989 Dependence on other enabling machines and devices: Secondary | ICD-10-CM | POA: Diagnosis not present

## 2024-04-09 DIAGNOSIS — I110 Hypertensive heart disease with heart failure: Secondary | ICD-10-CM | POA: Diagnosis not present

## 2024-04-09 DIAGNOSIS — I5030 Unspecified diastolic (congestive) heart failure: Secondary | ICD-10-CM | POA: Diagnosis not present

## 2024-04-09 DIAGNOSIS — M069 Rheumatoid arthritis, unspecified: Secondary | ICD-10-CM | POA: Diagnosis not present

## 2024-04-09 DIAGNOSIS — E785 Hyperlipidemia, unspecified: Secondary | ICD-10-CM | POA: Diagnosis not present

## 2024-04-09 DIAGNOSIS — Z6835 Body mass index (BMI) 35.0-35.9, adult: Secondary | ICD-10-CM | POA: Diagnosis not present

## 2024-04-09 DIAGNOSIS — Z8669 Personal history of other diseases of the nervous system and sense organs: Secondary | ICD-10-CM

## 2024-04-09 DIAGNOSIS — E559 Vitamin D deficiency, unspecified: Secondary | ICD-10-CM

## 2024-04-09 DIAGNOSIS — E1169 Type 2 diabetes mellitus with other specified complication: Secondary | ICD-10-CM

## 2024-04-09 DIAGNOSIS — G4733 Obstructive sleep apnea (adult) (pediatric): Secondary | ICD-10-CM

## 2024-04-09 DIAGNOSIS — Z1211 Encounter for screening for malignant neoplasm of colon: Secondary | ICD-10-CM

## 2024-04-09 MED ORDER — TOPIRAMATE 50 MG PO TABS: 50.0000 mg | ORAL_TABLET | Freq: Two times a day (BID) | ORAL | 1 refills | Status: AC

## 2024-04-10 ENCOUNTER — Encounter: Payer: Self-pay | Admitting: Bariatrics

## 2024-04-10 ENCOUNTER — Ambulatory Visit: Admitting: Bariatrics

## 2024-04-10 ENCOUNTER — Other Ambulatory Visit (HOSPITAL_BASED_OUTPATIENT_CLINIC_OR_DEPARTMENT_OTHER): Payer: Self-pay

## 2024-04-10 VITALS — BP 127/75 | HR 66 | Ht 64.0 in | Wt 201.0 lb

## 2024-04-10 DIAGNOSIS — Z6834 Body mass index (BMI) 34.0-34.9, adult: Secondary | ICD-10-CM

## 2024-04-10 DIAGNOSIS — E669 Obesity, unspecified: Secondary | ICD-10-CM | POA: Diagnosis not present

## 2024-04-10 DIAGNOSIS — Z7985 Long-term (current) use of injectable non-insulin antidiabetic drugs: Secondary | ICD-10-CM

## 2024-04-10 DIAGNOSIS — G4733 Obstructive sleep apnea (adult) (pediatric): Secondary | ICD-10-CM | POA: Diagnosis not present

## 2024-04-10 DIAGNOSIS — E119 Type 2 diabetes mellitus without complications: Secondary | ICD-10-CM | POA: Diagnosis not present

## 2024-04-10 DIAGNOSIS — E1169 Type 2 diabetes mellitus with other specified complication: Secondary | ICD-10-CM

## 2024-04-10 LAB — BMP8+EGFR
BUN/Creatinine Ratio: 20 (ref 12–28)
BUN: 17 mg/dL (ref 8–27)
CO2: 18 mmol/L — ABNORMAL LOW (ref 20–29)
Calcium: 9.7 mg/dL (ref 8.7–10.3)
Chloride: 108 mmol/L — ABNORMAL HIGH (ref 96–106)
Creatinine, Ser: 0.84 mg/dL (ref 0.57–1.00)
Glucose: 75 mg/dL (ref 70–99)
Potassium: 3.9 mmol/L (ref 3.5–5.2)
Sodium: 141 mmol/L (ref 134–144)
eGFR: 75 mL/min/1.73

## 2024-04-10 LAB — HEMOGLOBIN A1C
Est. average glucose Bld gHb Est-mCnc: 100 mg/dL
Hgb A1c MFr Bld: 5.1 % (ref 4.8–5.6)

## 2024-04-10 MED ORDER — TIRZEPATIDE 12.5 MG/0.5ML ~~LOC~~ SOAJ
12.5000 mg | SUBCUTANEOUS | 0 refills | Status: AC
  Filled 2024-04-10: qty 2, 28d supply, fill #0

## 2024-04-10 NOTE — Progress Notes (Signed)
 "                                                                                                             WEIGHT SUMMARY AND BIOMETRICS  Weight Lost Since Last Visit: 3lb  Weight Gained Since Last Visit: 0   Vitals Temp: 98.4 F (36.9 C) BP: 127/75 Pulse Rate: 66 SpO2: 100 %   Anthropometric Measurements Height: 5' 4 (1.626 m) Weight: 201 lb (91.2 kg) BMI (Calculated): 34.48 Weight at Last Visit: 204lb Weight Lost Since Last Visit: 3lb Weight Gained Since Last Visit: 0 Starting Weight: 273lb Total Weight Loss (lbs): 72 lb (32.7 kg)   Body Composition  Body Fat %: 50.4 % Fat Mass (lbs): 101.4 lbs Muscle Mass (lbs): 94.6 lbs Visceral Fat Rating : 15   Other Clinical Data Fasting: no Labs: no Today's Visit #: 46 Starting Date: 09/04/20    OBESITY Brandi [name] is here to discuss her progress with her obesity treatment plan along with follow-up of her obesity related diagnoses.   Nutrition Plan: the Category 1 plan - 70% adherence.  Current exercise: none  Interim History:  She is down 3 lbs since her last visit.  Eating all of the food on the plan., Protein intake is as prescribed, Is not skipping meals, and Water intake is adequate.   Pharmacotherapy: Brandi [name] is on Mounjaro  12.5 mg SQ weekly Adverse side effects: None Hunger is moderately controlled.  Cravings are moderately controlled.  Assessment/Plan:   Type II Diabetes HgbA1c is at goal. Last A1c was 5.1 CBGs: Not checking      Episodes of hypoglycemia: no Medication(s): Mounjaro  12.5 mg SQ weekly  Lab Results  Component Value Date   HGBA1C 5.1 04/09/2024   HGBA1C 5.2 12/05/2023   HGBA1C 5.0 04/16/2023   Lab Results  Component Value Date   MICROALBUR 10 03/11/2020   LDLCALC 75 12/05/2023   CREATININE 0.84 04/09/2024   No results found for: GFR  Plan: Continue and refill Mounjaro  12.5 mg SQ weekly Continue all other medications.  Will keep all carbohydrates low both sweets and  starches.  Will continue exercise regimen to 30 to 60 minutes on most days of the week.  Aim for 7 to 9 hours of sleep nightly.  Eat more low glycemic index foods.   Obstructive Sleep Apnea Brandi [name] has a diagnosis of sleep apnea. She reports that she is using a CPAP regularly. Reports restful sleep most nights.   Plan: Continue CPAP therapy. Continue to practice good sleep hygiene.  Will add in elements of an antiinflammatory diet and  add in elements of a Mediterranean diet.  Reduce intake of carbohydrates for weight loss.      Generalized Obesity: Current BMI BMI (Calculated): 34.48   Pharmacotherapy Plan Continue and refill  Mounjaro  12.5 mg SQ weekly  Brandi [name] is currently in the action stage of change. As such, her goal is to continue with weight loss efforts.  She has agreed to the Category 1 plan.  Exercise goals: Older adults should determine their level of  effort for physical activity relative to their level of fitness.   Behavioral modification strategies: increase water intake, better snacking choices, planning for success, increasing vegetables, increasing fiber rich foods, avoiding temptations, keep healthy foods in the home, increase frequency of journaling, weigh protein portions, measure portion sizes, work on smaller portions, and mindful eating.  Brandi [name] has agreed to follow-up with our clinic in 4 weeks.    Objective:   VITALS: Per patient if applicable, see vitals. GENERAL: Alert and in no acute distress. CARDIOPULMONARY: No increased WOB. Speaking in clear sentences.  PSYCH: Pleasant and cooperative. Speech normal rate and rhythm. Affect is appropriate. Insight and judgement are appropriate. Attention is focused, linear, and appropriate.  NEURO: Oriented as arrived to appointment on time with no prompting.   Attestation Statements:   This was prepared with the assistance of Engineer, Civil (consulting).  Occasional wrong-word or sound-a-like substitutions may have  occurred due to the inherent limitations of voice recognition.   Clayborne Daring, DO    "

## 2024-04-20 ENCOUNTER — Ambulatory Visit: Payer: Self-pay | Admitting: Nurse Practitioner

## 2024-04-20 NOTE — Assessment & Plan Note (Signed)
 According to USPTF Colorectal cancer Screening guidelines. Cologuard is recommended every 3 years, starting at age 70 years. Order for cologuard sent

## 2024-04-20 NOTE — Assessment & Plan Note (Signed)
 Blood pressure was elevated during the visit but is currently well-managed.

## 2024-04-20 NOTE — Assessment & Plan Note (Signed)
 Will check vitamin D  level and supplement as needed.    Also encouraged to spend 15 minutes in the sun daily.

## 2024-04-20 NOTE — Assessment & Plan Note (Signed)
 She has not been using her CPAP machine since losing her weight.

## 2024-04-20 NOTE — Assessment & Plan Note (Signed)
 Weight is stable. She is actively trying to stay active. - Continue attending weight management appointments.

## 2024-04-20 NOTE — Assessment & Plan Note (Signed)
 A1c and cholesterol levels were normal in September. - Repeat A1c and cholesterol levels in September - Requested recent eye exam report from [facility] in [facility].

## 2024-04-20 NOTE — Assessment & Plan Note (Signed)
 Continue f/u with Rheumatology

## 2024-05-08 ENCOUNTER — Ambulatory Visit: Admitting: Bariatrics

## 2024-05-28 ENCOUNTER — Ambulatory Visit: Payer: Medicare Other

## 2024-08-07 ENCOUNTER — Ambulatory Visit: Payer: Self-pay | Admitting: Nurse Practitioner

## 2024-12-09 ENCOUNTER — Encounter: Payer: Self-pay | Admitting: Nurse Practitioner
